# Patient Record
Sex: Male | Born: 1945 | Race: White | Hispanic: No | Marital: Married | Smoking: Former smoker
Health system: Southern US, Community
[De-identification: ages and names within clinical notes are randomized; demographics above are authoritative.]

## PROBLEM LIST (undated history)

## (undated) DIAGNOSIS — N289 Disorder of kidney and ureter, unspecified: Secondary | ICD-10-CM

## (undated) DIAGNOSIS — Z8719 Personal history of other diseases of the digestive system: Secondary | ICD-10-CM

## (undated) DIAGNOSIS — I10 Essential (primary) hypertension: Secondary | ICD-10-CM

## (undated) DIAGNOSIS — Z8619 Personal history of other infectious and parasitic diseases: Secondary | ICD-10-CM

## (undated) DIAGNOSIS — N189 Chronic kidney disease, unspecified: Secondary | ICD-10-CM

## (undated) DIAGNOSIS — T7840XA Allergy, unspecified, initial encounter: Secondary | ICD-10-CM

## (undated) DIAGNOSIS — Z87442 Personal history of urinary calculi: Secondary | ICD-10-CM

## (undated) DIAGNOSIS — F909 Attention-deficit hyperactivity disorder, unspecified type: Secondary | ICD-10-CM

## (undated) DIAGNOSIS — T1490XA Injury, unspecified, initial encounter: Secondary | ICD-10-CM

## (undated) DIAGNOSIS — K219 Gastro-esophageal reflux disease without esophagitis: Secondary | ICD-10-CM

## (undated) DIAGNOSIS — K2100 Gastro-esophageal reflux disease with esophagitis, without bleeding: Secondary | ICD-10-CM

## (undated) HISTORY — DX: Personal history of other infectious and parasitic diseases: Z86.19

## (undated) HISTORY — DX: Personal history of other diseases of the digestive system: Z87.19

## (undated) HISTORY — DX: Allergy, unspecified, initial encounter: T78.40XA

## (undated) HISTORY — DX: Gastro-esophageal reflux disease without esophagitis: K21.9

## (undated) HISTORY — DX: Personal history of urinary calculi: Z87.442

---

## 2005-01-06 DIAGNOSIS — Z8719 Personal history of other diseases of the digestive system: Secondary | ICD-10-CM

## 2005-01-06 HISTORY — DX: Personal history of other diseases of the digestive system: Z87.19

## 2005-10-21 DIAGNOSIS — Z7189 Other specified counseling: Secondary | ICD-10-CM | POA: Insufficient documentation

## 2005-10-21 DIAGNOSIS — Z136 Encounter for screening for cardiovascular disorders: Secondary | ICD-10-CM | POA: Insufficient documentation

## 2005-10-21 DIAGNOSIS — N200 Calculus of kidney: Secondary | ICD-10-CM | POA: Insufficient documentation

## 2005-10-21 DIAGNOSIS — K219 Gastro-esophageal reflux disease without esophagitis: Secondary | ICD-10-CM | POA: Insufficient documentation

## 2005-10-21 DIAGNOSIS — K5730 Diverticulosis of large intestine without perforation or abscess without bleeding: Secondary | ICD-10-CM | POA: Insufficient documentation

## 2005-10-21 DIAGNOSIS — J309 Allergic rhinitis, unspecified: Secondary | ICD-10-CM | POA: Insufficient documentation

## 2015-04-02 DIAGNOSIS — M2550 Pain in unspecified joint: Secondary | ICD-10-CM | POA: Insufficient documentation

## 2016-04-03 DIAGNOSIS — F900 Attention-deficit hyperactivity disorder, predominantly inattentive type: Secondary | ICD-10-CM | POA: Insufficient documentation

## 2016-05-02 DIAGNOSIS — N183 Chronic kidney disease, stage 3 unspecified: Secondary | ICD-10-CM | POA: Insufficient documentation

## 2016-05-02 DIAGNOSIS — N1832 Chronic kidney disease, stage 3b: Secondary | ICD-10-CM | POA: Insufficient documentation

## 2016-09-20 ENCOUNTER — Emergency Department (HOSPITAL_COMMUNITY): Payer: 59

## 2016-09-20 ENCOUNTER — Emergency Department (HOSPITAL_COMMUNITY)
Admission: EM | Admit: 2016-09-20 | Discharge: 2016-09-20 | Disposition: A | Discharge status: Home / Self Care | Payer: 59 | Attending: Emergency Medicine | Admitting: Emergency Medicine

## 2016-09-20 ENCOUNTER — Ambulatory Visit (HOSPITAL_COMMUNITY)
Admission: EM | Admit: 2016-09-20 | Discharge: 2016-09-20 | Disposition: A | Discharge status: Home / Self Care | Payer: 59 | Attending: Internal Medicine | Admitting: Internal Medicine

## 2016-09-20 ENCOUNTER — Encounter (HOSPITAL_COMMUNITY): Payer: Self-pay | Admitting: Emergency Medicine

## 2016-09-20 DIAGNOSIS — R531 Weakness: Secondary | ICD-10-CM

## 2016-09-20 DIAGNOSIS — I10 Essential (primary) hypertension: Secondary | ICD-10-CM | POA: Diagnosis not present

## 2016-09-20 DIAGNOSIS — F909 Attention-deficit hyperactivity disorder, unspecified type: Secondary | ICD-10-CM | POA: Diagnosis not present

## 2016-09-20 DIAGNOSIS — R42 Dizziness and giddiness: Secondary | ICD-10-CM

## 2016-09-20 DIAGNOSIS — Z79899 Other long term (current) drug therapy: Secondary | ICD-10-CM | POA: Insufficient documentation

## 2016-09-20 DIAGNOSIS — R110 Nausea: Secondary | ICD-10-CM | POA: Diagnosis not present

## 2016-09-20 DIAGNOSIS — E860 Dehydration: Secondary | ICD-10-CM | POA: Diagnosis not present

## 2016-09-20 DIAGNOSIS — Z7982 Long term (current) use of aspirin: Secondary | ICD-10-CM | POA: Insufficient documentation

## 2016-09-20 DIAGNOSIS — R001 Bradycardia, unspecified: Secondary | ICD-10-CM | POA: Diagnosis not present

## 2016-09-20 HISTORY — DX: Essential (primary) hypertension: I10

## 2016-09-20 LAB — COMPREHENSIVE METABOLIC PANEL
ALT: 20 U/L (ref 17–63)
AST: 27 U/L (ref 15–41)
Albumin: 3.5 g/dL (ref 3.5–5.0)
Alkaline Phosphatase: 80 U/L (ref 38–126)
Anion gap: 9 (ref 5–15)
BUN: 19 mg/dL (ref 6–20)
CO2: 24 mmol/L (ref 22–32)
Calcium: 9 mg/dL (ref 8.9–10.3)
Chloride: 108 mmol/L (ref 101–111)
Creatinine, Ser: 1.35 mg/dL — ABNORMAL HIGH (ref 0.61–1.24)
GFR calc Af Amer: >60 mL/min (ref >60)
GFR calc non Af Amer: 52 mL/min — ABNORMAL LOW (ref >60)
Glucose, Bld: 112 mg/dL — ABNORMAL HIGH (ref 65–99)
Potassium: 4.1 mmol/L (ref 3.5–5.1)
Sodium: 141 mmol/L (ref 135–145)
Total Bilirubin: 0.7 mg/dL (ref 0.3–1.2)
Total Protein: 6.3 g/dL — ABNORMAL LOW (ref 6.5–8.1)

## 2016-09-20 LAB — CBC
HCT: 40.2 % (ref 39.0–52.0)
Hemoglobin: 13.6 g/dL (ref 13.0–17.0)
MCH: 30.3 pg (ref 26.0–34.0)
MCHC: 33.8 g/dL (ref 30.0–36.0)
MCV: 89.5 fL (ref 78.0–100.0)
Platelets: 182 10*3/uL (ref 150–400)
RBC: 4.49 MIL/uL (ref 4.22–5.81)
RDW: 12.8 % (ref 11.5–15.5)
WBC: 4.8 10*3/uL (ref 4.0–10.5)

## 2016-09-20 LAB — DIFFERENTIAL
Basophils Absolute: 0 10*3/uL (ref 0.0–0.1)
Basophils Relative: 0 %
Eosinophils Absolute: 0.1 10*3/uL (ref 0.0–0.7)
Eosinophils Relative: 2 %
Lymphocytes Relative: 23 %
Lymphs Abs: 1.1 10*3/uL (ref 0.7–4.0)
Monocytes Absolute: 0.3 10*3/uL (ref 0.1–1.0)
Monocytes Relative: 6 %
Neutro Abs: 3.3 10*3/uL (ref 1.7–7.7)
Neutrophils Relative %: 69 %

## 2016-09-20 LAB — PROTIME-INR
INR: 1.01
Prothrombin Time: 13.3 seconds (ref 11.4–15.2)

## 2016-09-20 LAB — I-STAT CHEM 8, ED
BUN: 21 mg/dL — ABNORMAL HIGH (ref 6–20)
Calcium, Ion: 1.21 mmol/L (ref 1.15–1.40)
Chloride: 108 mmol/L (ref 101–111)
Creatinine, Ser: 1.3 mg/dL — ABNORMAL HIGH (ref 0.61–1.24)
Glucose, Bld: 109 mg/dL — ABNORMAL HIGH (ref 65–99)
HCT: 41 % (ref 39.0–52.0)
Hemoglobin: 13.9 g/dL (ref 13.0–17.0)
Potassium: 4.2 mmol/L (ref 3.5–5.1)
Sodium: 144 mmol/L (ref 135–145)
TCO2: 24 mmol/L (ref 22–32)

## 2016-09-20 LAB — I-STAT TROPONIN, ED: Troponin i, poc: 0 ng/mL (ref 0.00–0.08)

## 2016-09-20 LAB — APTT: aPTT: 30 seconds (ref 24–36)

## 2016-09-20 LAB — I-STAT CG4 LACTIC ACID, ED: Lactic Acid, Venous: 0.92 mmol/L (ref 0.5–1.9)

## 2016-09-20 LAB — MAGNESIUM: Magnesium: 2.1 mg/dL (ref 1.7–2.4)

## 2016-09-20 LAB — CBG MONITORING, ED: Glucose-Capillary: 89 mg/dL (ref 65–99)

## 2016-09-20 LAB — CK: Total CK: 116 U/L (ref 49–397)

## 2016-09-20 MED ORDER — SODIUM CHLORIDE 0.9 % IV BOLUS (SEPSIS)
1000.0000 mL | Freq: Once | INTRAVENOUS | Status: AC
  Administered 2016-09-20: 1000 mL via INTRAVENOUS

## 2016-09-20 MED ORDER — ONDANSETRON HCL 4 MG/2ML IJ SOLN
4.0000 mg | Freq: Once | INTRAMUSCULAR | Status: AC
  Administered 2016-09-20: 4 mg via INTRAVENOUS
  Filled 2016-09-20: qty 2

## 2016-09-20 MED ORDER — ONDANSETRON HCL 4 MG PO TABS: 4.0000 mg | ORAL_TABLET | Freq: Three times a day (TID) | ORAL | 0 refills | Status: DC | PRN

## 2016-09-20 NOTE — ED Triage Notes (Addendum)
Pt sent from urgent care for bradycardia. States weakness with dizziness and diaphoresis since waking up this morning. Pt states "this happens when I am dehydrated." Denies chest pain, shortness of breath. Does state headache to bilateral sides of head. Pt states he never gets headaches. Pt states he felt fine when he went to bed. No neurological symptoms, clear speech. Pt states some blurred vision this morning. Normal vision at present. Pt has hx of kidney stones and htn, denies other history. Pt from [place], unable to see medical history records at triage.

## 2016-09-20 NOTE — Discharge Instructions (Signed)
Please read and follow all provided instructions.  Your diagnoses today include:  1. Lightheadedness   2. Nausea   3. Dehydration     Tests performed today include: Vital signs. See below for your results today.  Blood work EKG Chest xray CT of head  Medications prescribed:  Take Zofran as needed for nausea.   Home care instructions:  Follow any educational materials contained in this packet.  Follow-up instructions: Please follow-up with your primary care provider for further evaluation of symptoms and treatment. If you do not have a PCP then you can use the number below to find one.   Return instructions:  Please return to the Emergency Department if you do not get better, if you get worse, or new symptoms OR  - Fever (temperature greater than 101.40F)  - Bleeding that does not stop with holding pressure to the area    -Severe pain (please note that you may be more sore the day after your accident)  - Chest Pain  - Difficulty breathing  - Severe nausea or vomiting  - Inability to tolerate food and liquids  - Passing out  - Skin becoming red around your wounds  - Change in mental status (confusion or lethargy)  - New numbness or weakness    Please return if you have any other emergent concerns.  Additional Information:  Your vital signs today were: BP 133/60    Pulse 61    Temp (!) 97.4 F (36.3 C) (Oral)    Resp 15    SpO2 100%  If your blood pressure (BP) was elevated above 135/85 this visit, please have this repeated by your doctor within one month.

## 2016-09-20 NOTE — ED Provider Notes (Signed)
[facility]    CSN: [id number] Arrival date & time: 09/20/16  1206     History   Chief Complaint Chief Complaint  Patient presents with  . Weakness    HPI Gary [name] is a 71 y.o. male.   71 year old male with history of hypertension comes in for 1 day history of lightheadedness, dizziness, diaphoresis. Denies chest pain, shortness of breath, palpitations. Family member states that he gets dehydrated easily due to minimal water intake throughout the day, and was mowing the lawn recently. Denies personal history of heart disease. Family history is significant for MI/stroke of mother, first MI at age 21.       Past Medical History:  Diagnosis Date  . Hypertension     There are no active problems to display for this patient.   History reviewed. No pertinent surgical history.     Home Medications    Prior to Admission medications   Medication Sig Start Date End Date Taking? Authorizing Provider  amphetamine-dextroamphetamine (ADDERALL) 10 MG tablet Take 10 mg by mouth daily with breakfast.   Yes [provider]  aspirin EC 81 MG tablet Take 81 mg by mouth daily.   Yes [provider]  atenolol (TENORMIN) 25 MG tablet Take 25 mg by mouth daily.    Yes [provider]  atorvastatin (LIPITOR) 40 MG tablet Take 40 mg by mouth daily.    Yes [provider]  lisinopril (PRINIVIL,ZESTRIL) 10 MG tablet Take 10 mg by mouth daily.    Yes [provider]  tamsulosin (FLOMAX) 0.4 MG CAPS capsule Take 0.4 mg by mouth.   Yes [provider]  acetaminophen (TYLENOL) 500 MG tablet Take 500 mg by mouth every 6 (six) hours as needed for mild pain.    [provider]  pantoprazole (PROTONIX) 40 MG tablet Take 40 mg by mouth daily.    [provider]  PRESCRIPTION MEDICATION Place 2 sprays into both nostrils as needed (Rhinitis).    [provider]  traMADol (ULTRAM) 50 MG tablet Take 50 mg by  mouth daily.    [provider]    Family History History reviewed. No pertinent family history.  Social History Social History  Substance Use Topics  . Smoking status: Never Smoker  . Smokeless tobacco: Never Used  . Alcohol use No     Allergies   Other and Shellfish allergy   Review of Systems Review of Systems  Reason unable to perform ROS: See HPI as above.     Physical Exam Triage Vital Signs ED Triage Vitals [09/20/16 1225]  Enc Vitals Group     BP (!) 145/66     Pulse Rate (!) 58     Resp 16     Temp (!) 97.1 F (36.2 C)     Temp Source Temporal     SpO2 100 %     Weight      Height      Head Circumference      Peak Flow      Pain Score      Pain Loc      Pain Edu?      Excl. in [place]?    No data found.   Updated Vital Signs BP (!) 145/66 (BP Location: Left Arm) Comment: reported BP to both CMA [employer] and PA Amy Yu  Pulse (!) 58 Comment: reported HR to CMA [employer] and PA Amy Tasia Catchings  Temp (!) 97.1 F (  36.2 C) (Temporal) Comment: Could not obtain temperature, wife stated that when he gets dehydrated a temperature is difficult to obtain. reported to both [employer] PA and CMA Ramon Meza  Resp 16   SpO2 100%    Physical Exam  Constitutional: He is oriented to person, place, and time. He appears well-developed and well-nourished. Distressed: Patient lying on exam table, appearing weak.  Cardiovascular: Regular rhythm and normal heart sounds.  Bradycardia present.  Exam reveals no gallop and no friction rub.   No murmur heard. Pulmonary/Chest: Effort normal and breath sounds normal. No respiratory distress. He has no wheezes. He has no rales.  Neurological: He is alert and oriented to person, place, and time.     UC Treatments / Results  Labs (all labs ordered are listed, but only abnormal results are displayed) Labs Reviewed - No data to display  EKG  EKG Interpretation None       Radiology Dg Chest 2 View  Result Date:  09/20/2016 CLINICAL DATA:  Per patient weakness, dizziness, bradycardia and nausea that started this AM. EXAM: CHEST  2 VIEW COMPARISON:  None. FINDINGS: Mild hyperinflation. Midline trachea. Normal heart size and mediastinal contours. No pleural effusion or pneumothorax. Clear lungs. IMPRESSION: Hyperinflation, without acute disease. Electronically Signed   By: Abigail Miyamoto M.D.   On: 09/20/2016 13:33   Ct Head Wo Contrast  Result Date: 09/20/2016 CLINICAL DATA:  71 year old male with headache, dizziness and blurred vision for 1 day. EXAM: CT HEAD WITHOUT CONTRAST TECHNIQUE: Contiguous axial images were obtained from the base of the skull through the vertex without intravenous contrast. COMPARISON:  None. FINDINGS: Brain: No evidence of infarction, hemorrhage, hydrocephalus, extra-axial collection or mass lesion/mass effect. Vascular: No hyperdense vessel or unexpected calcification. Skull: Normal. Negative for fracture or focal lesion. Sinuses/Orbits: Opacification of the visualized superior left maxillary sinus noted. No other abnormalities noted. Other: None. IMPRESSION: 1. No evidence of a intracranial abnormality 2. Opacified visualized superior left maxillary sinus/sinus disease. Electronically Signed   By: Margarette Canada M.D.   On: 09/20/2016 14:34    Procedures Procedures (including critical care time)  Medications Ordered in UC Medications - No data to display   Initial Impression / Assessment and Plan / UC Course  I have reviewed the triage vital signs and the nursing notes.  Pertinent labs & imaging results that were available during my care of the patient were reviewed by me and considered in my medical decision making (see chart for details).    71 year old male with history of hypertension comes in for 1 day history of lightheadedness, dizziness, diaphoresis. His initial HR was 49, which increased to 58 with recheck. Given positive family history of MI at age 32, patient to go to the  ED for further evaluation and treatment.   Final Clinical Impressions(s) / UC Diagnoses   Final diagnoses:  Weakness  Dizziness    New Prescriptions Discharge Medication List as of 09/20/2016 12:34 PM       Ok Edwards, PA-C 09/20/16 1723

## 2016-09-20 NOTE — ED Provider Notes (Signed)
[facility] DEPT Provider Note   CSN: [id number] Arrival date & time: 09/20/16  1243     History   Chief Complaint Chief Complaint  Patient presents with  . Dizziness    HPI Gary [name] is a 71 y.o. male with a history of HTN and ADHD who presents to the ED today from UC bradycardia and complaints of lightheadedness.   Patient states that last night before he went to bed and when he awoke this morning he was asymptomatic. 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 He denies chest pain or sob. The patient states that he went to an air conditioned room which resolved the diaphoresis but his lightheadedness and nausea continued. He presented to UC for this where he developed a b/l temporal headache with blurred vision that last for several minutes. He denies history of HA. No flashers, floaters, diplopia, photophobia, eye redness, trauma, painful EOM. HA and visual symptoms are now resolved. He denies dizziness or vertigo symptoms. At [facility] he was found to be bradycardic. He takes lisinopril and atenolol for his BP (last dose, last night). He took Adderall this morning for his ADHD. Notes he has been out in the sun, mowing his lawn and working on his yard the last few days. He says that he has not been hydrating. He notes "this is similar to what happens when I get dehydrated". He is from [place] and traveled [place] ~3 months ago.  Denies fever, focal weakness, palpitations, melena, recent URI, changes in hearing, unilateral neck pain, unilateral weakness, facial asymmetry, difficulty with speech, change in gait, LOC, emesis, alcohol/drug use, or trauma. No new medications.    HPI    Past Medical History:  Diagnosis Date  . Hypertension     There are no active problems to display for this patient.   No past surgical history on file.     Home Medications    Prior to  Admission medications   Medication Sig Start Date End Date Taking? Authorizing Provider  acetaminophen (TYLENOL) 500 MG tablet Take 500 mg by mouth every 6 (six) hours as needed for mild pain.   Yes [provider]  amphetamine-dextroamphetamine (ADDERALL) 10 MG tablet Take 10 mg by mouth daily with breakfast.   Yes [provider]  aspirin EC 81 MG tablet Take 81 mg by mouth daily.   Yes [provider]  atenolol (TENORMIN) 25 MG tablet Take 25 mg by mouth daily.    Yes [provider]  atorvastatin (LIPITOR) 40 MG tablet Take 40 mg by mouth daily.    Yes [provider]  lisinopril (PRINIVIL,ZESTRIL) 10 MG tablet Take 10 mg by mouth daily.    Yes [provider]  pantoprazole (PROTONIX) 40 MG tablet Take 40 mg by mouth daily.   Yes [provider]  PRESCRIPTION MEDICATION Place 2 sprays into both nostrils as needed (Rhinitis).   Yes [provider]  tamsulosin (FLOMAX) 0.4 MG CAPS capsule Take 0.4 mg by mouth.   Yes [provider]  traMADol (ULTRAM) 50 MG tablet Take 50 mg by mouth daily.   Yes [provider]  ondansetron (ZOFRAN) 4 MG tablet Take 1 tablet (4 mg total) by mouth every 8 (eight) hours as needed for nausea or vomiting. 09/20/16   Maczis, Barth Kirks, PA-C    Family History No family history on file.  Social History Social History  Substance Use Topics  . Smoking status: Never Smoker  . Smokeless tobacco: Never Used  . Alcohol use No     Allergies   Other and Shellfish allergy   Review of Systems Review of Systems  All other systems reviewed and are negative.    Physical Exam Updated Vital Signs BP (!) 160/80 (BP Location: Right Arm)   Pulse 68   Temp 97.6 F (36.4 C) (Oral)   Resp 16   SpO2 100%   Physical Exam  Constitutional: He appears well-developed and well-nourished.  Nontoxic appearing  HENT:  Head: Normocephalic and atraumatic.  Right Ear: External ear  normal.  Left Ear: External ear normal.  Nose: Nose normal.  Mouth/Throat: Uvula is midline, oropharynx is clear and moist and mucous membranes are normal. No tonsillar exudate.  Eyes: Pupils are equal, round, and reactive to light. Right eye exhibits no discharge. Left eye exhibits no discharge. No scleral icterus.  Neck: Trachea normal and normal range of motion. Neck supple. No spinous process tenderness present. No neck rigidity. Normal range of motion present.  No meningismus  Cardiovascular: Normal rate, regular rhythm and intact distal pulses.   No murmur heard. Pulses:      Radial pulses are 2+ on the right side, and 2+ on the left side.       Dorsalis pedis pulses are 2+ on the right side, and 2+ on the left side.       Posterior tibial pulses are 2+ on the right side, and 2+ on the left side.  No lower extremity swelling or edema. Calves symmetric in size bilaterally.  Pulmonary/Chest: Effort normal and breath sounds normal. He exhibits no tenderness.  Abdominal: Soft. Bowel sounds are normal. There is no tenderness. There is no rebound and no guarding.  Musculoskeletal: He exhibits no edema.  Lymphadenopathy:    He has no cervical adenopathy.  Neurological: He is alert.  Speech clear. Follows commands. No facial droop. PERRLA. EOMI. Normal peripheral fields. CN III-XII intact.  Grossly moves all extremities 4 without ataxia. Coordination intact. Able and appropriate strength for age to upper and lower extremities bilaterally including grip strength. Sensation to light touch intact bilaterally for upper and lower. Patellar deep tendon reflex 2+ and equal bilaterally. Normal finger to nose and rapid alternating movements. Normal heel to shin balance. Negative Romberg. No pronator drift. Normal gait.   Skin: Skin is warm and dry. Capillary refill takes less than 2 seconds. No rash noted. He is not diaphoretic.  Psychiatric: He has a normal mood and affect.  Nursing note and vitals  reviewed.    ED Treatments / Results  Labs (all labs ordered are listed, but only abnormal results are displayed) Labs Reviewed  COMPREHENSIVE METABOLIC PANEL - Abnormal; Notable for the following:       Result Value   Glucose, Bld 112 (*)    Creatinine, Ser 1.35 (*)    Total Protein 6.3 (*)    GFR calc non Af Amer 52 (*)    All other components within normal limits  I-STAT CHEM 8, ED - Abnormal; Notable for the following:    BUN 21 (*)    Creatinine, Ser 1.30 (*)    Glucose, Bld 109 (*)    All other components within normal limits  PROTIME-INR  APTT  CBC  DIFFERENTIAL  CK  MAGNESIUM  I-STAT TROPONIN, ED  CBG MONITORING, ED  I-STAT CG4 LACTIC ACID, ED    EKG  EKG Interpretation  Date/Time:  Saturday September 20 2016 12:49:47 EDT Ventricular Rate:  58 PR Interval:  140 QRS Duration: 86 QT Interval:  442 QTC Calculation: 433 R Axis:   37 Text Interpretation:  Sinus bradycardia Otherwise normal ECG T wave inversion III, no previous EKG available Confirmed by Theotis Burrow [phone]) on 09/20/2016 1:37:45 PM       Radiology Dg Chest 2 View  Result Date: 09/20/2016 CLINICAL DATA:  Per patient weakness, dizziness, bradycardia and nausea that started this AM. EXAM: CHEST  2 VIEW COMPARISON:  None. FINDINGS: Mild hyperinflation. Midline trachea. Normal heart size and mediastinal contours. No pleural effusion or pneumothorax. Clear lungs. IMPRESSION: Hyperinflation, without acute disease. Electronically Signed   By: Abigail Miyamoto M.D.   On: 09/20/2016 13:33   Ct Head Wo Contrast  Result Date: 09/20/2016 CLINICAL DATA:  71 year old male with headache, dizziness and blurred vision for 1 day. EXAM: CT HEAD WITHOUT CONTRAST TECHNIQUE: Contiguous axial images were obtained from the base of the skull through the vertex without intravenous contrast. COMPARISON:  None. FINDINGS: Brain: No evidence of infarction, hemorrhage, hydrocephalus, extra-axial collection or mass lesion/mass  effect. Vascular: No hyperdense vessel or unexpected calcification. Skull: Normal. Negative for fracture or focal lesion. Sinuses/Orbits: Opacification of the visualized superior left maxillary sinus noted. No other abnormalities noted. Other: None. IMPRESSION: 1. No evidence of a intracranial abnormality 2. Opacified visualized superior left maxillary sinus/sinus disease. Electronically Signed   By: Margarette Canada M.D.   On: 09/20/2016 14:34    Procedures Procedures (including critical care time)  Medications Ordered in ED Medications  sodium chloride 0.9 % bolus 1,000 mL (0 mLs Intravenous Stopped 09/20/16 1703)  ondansetron (ZOFRAN) injection 4 mg (4 mg Intravenous Given 09/20/16 1445)     Initial Impression / Assessment and Plan / ED Course  I have reviewed the triage vital signs and the nursing notes.  Pertinent labs & imaging results that were available during my care of the patient were reviewed by me and considered in my medical decision making (see chart for details).     71 year old male presenting with lightheadedness, nausea and diaphoresis that began this morning while the patient was at rest. He is also reports brief episode of HA with visual symptoms while at [facility] that now have resolved. While at [facility] he was found to be bradycardic and sent over for further evaluation. The patient admits to poor hydration and long hours in the sun over the past few days. Patient noted to have a bradycardia but vitals otherwise reassuring. On exam patient is nontoxic-appearing. He has normal neuro exam without focal deficits. He is able to ambulate without difficult. Will check CBG for hypoglycemia. Will check EKG for arrhythmia. CXR, Basic labs, Mg and Tn ordered. Ck ordered to rule out rhabdomyolysis. Other labs ordered in triage. CT head ordered due to new HA. Will check orthostatics.   CBG reassuring. I-Stat chem 8 shows patient appears mildly dehydrated. Will start on fluids. Lactic acid negative. CBC  reassuring, CMP without electrolyte abnormalities. Ck normal and does not suggest rhabdo. Mg normal. CXR unremarkable. CT head without evidence of intracranial abnormality. Orthostatic negative.   Patient without syncopal episode. Patient without arrhythmia or tachycardia while here in the department.  Patient without history of congestive heart failure, normal hematocrit, normal ECG, no shortness of breath and systolic blood pressure greater than 90. Test and exam are reassuring as above. Will plan for discharge home with close PCP follow-up.  Pt has remained hemodynamically stable throughout  their time in the ED  BP (!) 160/80 (BP Location: Right Arm)   Pulse 68   Temp 97.6 F (36.4 C) (Oral)   Resp 16   SpO2 100%    The patient was discussed with and seen by Dr. Rex Kras who agrees with the treatment plan.  Patient able to ambulate without difficulty before d/c.  Final Clinical Impressions(s) / ED Diagnoses   Final diagnoses:  Lightheadedness  Nausea  Dehydration    New Prescriptions Discharge Medication List as of 09/20/2016  5:27 PM       Jillyn Ledger, PA-C 09/20/16 2213    Little, Wenda Overland, MD 09/22/16 1705

## 2016-09-20 NOTE — ED Triage Notes (Signed)
Stroke swallow screen not done at this time. Med. Given for nausea.

## 2016-09-20 NOTE — Discharge Instructions (Signed)
Given your symptoms and family history of heart disease go to the emergency department for further evaluation of weakness/dizziness.

## 2016-09-20 NOTE — ED Notes (Signed)
Pt states he is still feeling lightheaded but denies any nausea.

## 2016-09-20 NOTE — ED Triage Notes (Signed)
Pt c/o weakness onset this am associated w/LH, dizziness and diaphoresis  Wife sts he gets like this when he is dehydrated  Denies fever  A&O x4... NAD... Ambulatory

## 2016-09-20 NOTE — ED Notes (Signed)
ED Provider at bedside. 

## 2017-03-16 ENCOUNTER — Ambulatory Visit: Payer: 59 | Admitting: Physician Assistant

## 2017-03-16 ENCOUNTER — Other Ambulatory Visit: Payer: Self-pay

## 2017-03-16 ENCOUNTER — Encounter: Payer: Self-pay | Admitting: Physician Assistant

## 2017-03-16 VITALS — BP 130/72 | HR 74 | Temp 97.5°F | Resp 16 | Ht 70.5 in | Wt 166.0 lb

## 2017-03-16 DIAGNOSIS — I10 Essential (primary) hypertension: Secondary | ICD-10-CM

## 2017-03-16 DIAGNOSIS — R5382 Chronic fatigue, unspecified: Secondary | ICD-10-CM | POA: Diagnosis not present

## 2017-03-16 DIAGNOSIS — R6889 Other general symptoms and signs: Secondary | ICD-10-CM | POA: Diagnosis not present

## 2017-03-16 DIAGNOSIS — H9193 Unspecified hearing loss, bilateral: Secondary | ICD-10-CM

## 2017-03-16 DIAGNOSIS — L920 Granuloma annulare: Secondary | ICD-10-CM | POA: Diagnosis not present

## 2017-03-16 DIAGNOSIS — R209 Unspecified disturbances of skin sensation: Secondary | ICD-10-CM

## 2017-03-16 LAB — CBC WITH DIFFERENTIAL/PLATELET
Basophils Absolute: 0 10*3/uL (ref 0.0–0.1)
Basophils Relative: 0.6 % (ref 0.0–3.0)
Eosinophils Absolute: 0.1 10*3/uL (ref 0.0–0.7)
Eosinophils Relative: 2.6 % (ref 0.0–5.0)
HCT: 42.6 % (ref 39.0–52.0)
Hemoglobin: 14.6 g/dL (ref 13.0–17.0)
Lymphocytes Relative: 31.3 % (ref 12.0–46.0)
Lymphs Abs: 1.3 10*3/uL (ref 0.7–4.0)
MCHC: 34.3 g/dL (ref 30.0–36.0)
MCV: 89.2 fl (ref 78.0–100.0)
Monocytes Absolute: 0.3 10*3/uL (ref 0.1–1.0)
Monocytes Relative: 8 % (ref 3.0–12.0)
Neutro Abs: 2.4 10*3/uL (ref 1.4–7.7)
Neutrophils Relative %: 57.5 % (ref 43.0–77.0)
Platelets: 236 10*3/uL (ref 150.0–400.0)
RBC: 4.77 Mil/uL (ref 4.22–5.81)
RDW: 13 % (ref 11.5–15.5)
WBC: 4.1 10*3/uL (ref 4.0–10.5)

## 2017-03-16 NOTE — Patient Instructions (Addendum)
Please go to the lab today for blood work.  I will call you with your results. We will alter treatment regimen(s) if indicated by your results.   Please stop the Atenolol for now. Continue the Lisinopril as directed. If kidney function looks good today, I may increase the dose of this.  Continue other medications as directed for now.   The Atenolol can cause a lot of your symptoms so I want to stop this first while we are checking labs before we add on any other medication. 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   Follow-up with me in 10-14 days for reassessment of BP and further assessment of current issues.

## 2017-03-16 NOTE — Progress Notes (Signed)
  Patient presents to clinic today to establish care.  Acute Concerns: Patient endorses daily fatigue despite restful sleep at night. States it does not take much to make him fatigues. Notes coldness of extremities with discomfort, especially in winter months. Patient denies chest pain, palpitations, lightheadedness, dizziness, vision changes or frequent headaches. Denies history of known vitamin deficiency. Denies snoring at night. Denies fever, chills, night sweats or unexplainable changes in weight.   Chronic Issues: Hypertension --  Is currently on a regimen of Atenolol and Lisinopril. Patient denies chest pain, palpitations, lightheadedness, vision changes or frequent headaches. Does have a history of syncope secondary to severe dehydration.   Hyperlipidemia -- Is currently on Atorvastatin 40 mg daily. Is also taking an 81 mg ASA daily. Denies myalgias with medication. Is wondering if he still needs as high of a dose.    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 Feels he has new lesions of posterior neck. No current follow-up with Dermatology.   Past Medical History:  Diagnosis Date  . Allergy   . GERD (gastroesophageal reflux disease)   . History of chickenpox   . History of diverticulitis 2007  . History of kidney stones   . Hypertension     History reviewed. No pertinent surgical history.  Current Outpatient Medications on File Prior to Visit  Medication Sig Dispense Refill  . amphetamine-dextroamphetamine (ADDERALL) 10 MG tablet Take 10 mg by mouth daily with breakfast.    . amphetamine-dextroamphetamine (ADDERALL) 5 MG tablet Take 5 mg by mouth daily.    . aspirin EC 81 MG tablet Take 81 mg by mouth daily.    . atenolol (TENORMIN) 25 MG tablet Take 25 mg by mouth daily.     . atorvastatin (LIPITOR) 40 MG tablet Take 40 mg by mouth daily.     . ipratropium (ATROVENT) 0.03 % nasal spray Place 2 sprays into both nostrils every 12 (twelve)  hours.    . lisinopril (PRINIVIL,ZESTRIL) 10 MG tablet Take 10 mg by mouth daily.     . Multiple Vitamin (MULTIVITAMIN) tablet Take 1 tablet by mouth daily.    . pantoprazole (PROTONIX) 40 MG tablet Take 40 mg by mouth daily.    . tamsulosin (FLOMAX) 0.4 MG CAPS capsule Take 0.4 mg by mouth.    . traMADol (ULTRAM) 50 MG tablet Take 50 mg by mouth daily.    . vitamin C (ASCORBIC ACID) 500 MG tablet Take 500 mg by mouth daily.     No current facility-administered medications on file prior to visit.     Allergies  Allergen Reactions  . Other Other (See Comments)    Amoxetine causing: unknown  . Shellfish Allergy Hives    Family History  Problem Relation Age of Onset  . Heart disease Mother   . Hypertension Mother   . Stroke Mother     Social History   Socioeconomic History  . Marital status: Married    Spouse name: Not on file  . Number of children: Not on file  . Years of education: Not on file  . Highest education level: Not on file  Social Needs  . Financial resource strain: Not on file  . Food insecurity - worry: Not on file  . Food insecurity - inability: Not on file  . Transportation needs - medical: Not on file  . Transportation needs - non-medical: Not on file  Occupational History  . Not on file  Tobacco Use  . Smoking status:   Former Smoker  . Smokeless tobacco: Never Used  Substance and Sexual Activity  . Alcohol use: No  . Drug use: No  . Sexual activity: Yes  Other Topics Concern  . Not on file  Social History Narrative  . Not on file   Review of Systems  Constitutional: Positive for malaise/fatigue. Negative for chills and fever.  HENT: Positive for hearing loss. Negative for ear discharge, ear pain and tinnitus.   Eyes: Negative for blurred vision and double vision.  Respiratory: Negative for cough, sputum production and shortness of breath.   Cardiovascular: Negative for chest pain and palpitations.  Musculoskeletal: Negative for myalgias.    Skin: Positive for rash.  Neurological: Negative for dizziness and headaches.  Endo/Heme/Allergies: Negative for environmental allergies.  Psychiatric/Behavioral: Negative for depression, hallucinations, substance abuse and suicidal ideas. The patient is not nervous/anxious.     BP 130/72   Pulse 74   Temp (!) 97.5 F (36.4 C) (Oral)   Resp 16   Ht 5' 10.5" (1.791 m)   Wt 166 lb (75.3 kg)   SpO2 98%   BMI 23.48 kg/m   Physical Exam  Constitutional: He is oriented to person, place, and time and well-developed, well-nourished, and in no distress.  HENT:  Head: Normocephalic and atraumatic.  Eyes: Conjunctivae are normal.  Neck: Neck supple.  Cardiovascular: Normal rate, regular rhythm, normal heart sounds and intact distal pulses.  Pulmonary/Chest: Effort normal and breath sounds normal. No respiratory distress. He has no wheezes. He has no rales. He exhibits no tenderness.  Abdominal: Soft. Bowel sounds are normal. He exhibits no distension and no mass. There is no tenderness. There is no rebound and no guarding.  Neurological: He is alert and oriented to person, place, and time.  Skin: Skin is warm and dry. No rash noted.  Psychiatric: Affect normal.  Vitals reviewed.  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 Will refer to Dermatology for management.  - Ambulatory referral to Dermatology  2. Essential hypertension Fating lipids today. 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 Continue Lisinopril. Close follow-up scheduled to reassess.  - Lipid panel  3. Cold extremities Concern that Atenolol is contributing. Will hold for now and see if symptoms improving. If not, would recommend switching BB for CCB to help with vasodilation.   4. Chronic fatigue Unspecified. Concern that BB is contributing. We are holding currently. Labs as noted below.  - CBC w/Diff - Comp Met (CMET) -  TSH - B12 and Folate Panel - Vitamin D (25 hydroxy)  5. Bilateral hearing loss, unspecified hearing loss type Long-standing. No acute changes per patient. Referral to Audiology. - Ambulatory referral to Audiology   Leeanne Rio, PA-C

## 2017-03-17 LAB — COMPREHENSIVE METABOLIC PANEL
ALT: 21 U/L (ref 0–53)
AST: 21 U/L (ref 0–37)
Albumin: 3.6 g/dL (ref 3.5–5.2)
Alkaline Phosphatase: 84 U/L (ref 39–117)
BUN: 18 mg/dL (ref 6–23)
CO2: 28 mEq/L (ref 19–32)
Calcium: 9.7 mg/dL (ref 8.4–10.5)
Chloride: 108 mEq/L (ref 96–112)
Creatinine, Ser: 1.21 mg/dL (ref 0.40–1.50)
GFR: 63.13 mL/min (ref >60.00)
Glucose, Bld: 84 mg/dL (ref 70–99)
Potassium: 4.1 mEq/L (ref 3.5–5.1)
Sodium: 143 mEq/L (ref 135–145)
Total Bilirubin: 0.3 mg/dL (ref 0.2–1.2)
Total Protein: 6.4 g/dL (ref 6.0–8.3)

## 2017-03-17 LAB — LIPID PANEL
Cholesterol: 121 mg/dL (ref 0–200)
HDL: 30.7 mg/dL — ABNORMAL LOW (ref >39.00)
LDL Cholesterol: 63 mg/dL (ref 0–99)
NonHDL: 90.75
Total CHOL/HDL Ratio: 4
Triglycerides: 137 mg/dL (ref 0.0–149.0)
VLDL: 27.4 mg/dL (ref 0.0–40.0)

## 2017-03-17 LAB — B12 AND FOLATE PANEL
Folate: 19.1 ng/mL (ref >5.9)
Vitamin B-12: 189 pg/mL — ABNORMAL LOW (ref 211–911)

## 2017-03-17 LAB — VITAMIN D 25 HYDROXY (VIT D DEFICIENCY, FRACTURES): VITD: 21.23 ng/mL — ABNORMAL LOW (ref 30.00–100.00)

## 2017-03-17 LAB — TSH: TSH: 2.92 u[IU]/mL (ref 0.35–4.50)

## 2017-03-18 ENCOUNTER — Other Ambulatory Visit: Payer: Self-pay | Admitting: Physician Assistant

## 2017-03-18 DIAGNOSIS — E559 Vitamin D deficiency, unspecified: Secondary | ICD-10-CM

## 2017-03-18 DIAGNOSIS — E538 Deficiency of other specified B group vitamins: Secondary | ICD-10-CM

## 2017-03-18 MED ORDER — VITAMIN D (CHOLECALCIFEROL) 25 MCG (1000 UT) PO CAPS: 1.0000 | ORAL_CAPSULE | Freq: Every day | ORAL | 0 refills | Status: DC

## 2017-03-18 MED ORDER — VITAMIN D (ERGOCALCIFEROL) 1.25 MG (50000 UNIT) PO CAPS: 50000.0000 [IU] | ORAL_CAPSULE | ORAL | 0 refills | Status: DC

## 2017-03-18 MED ORDER — VITAMIN B-12 100 MCG PO TABS: 100.0000 ug | ORAL_TABLET | Freq: Every day | ORAL | 0 refills | Status: DC

## 2017-03-19 ENCOUNTER — Encounter: Payer: Self-pay | Admitting: Physician Assistant

## 2017-03-23 ENCOUNTER — Encounter: Payer: Self-pay | Admitting: Physician Assistant

## 2017-03-24 ENCOUNTER — Encounter: Payer: Self-pay | Admitting: Physician Assistant

## 2017-03-24 DIAGNOSIS — K5790 Diverticulosis of intestine, part unspecified, without perforation or abscess without bleeding: Secondary | ICD-10-CM | POA: Insufficient documentation

## 2017-03-24 DIAGNOSIS — E785 Hyperlipidemia, unspecified: Secondary | ICD-10-CM | POA: Insufficient documentation

## 2017-03-24 DIAGNOSIS — N182 Chronic kidney disease, stage 2 (mild): Secondary | ICD-10-CM

## 2017-03-24 DIAGNOSIS — Z87442 Personal history of urinary calculi: Secondary | ICD-10-CM | POA: Insufficient documentation

## 2017-03-24 DIAGNOSIS — I129 Hypertensive chronic kidney disease with stage 1 through stage 4 chronic kidney disease, or unspecified chronic kidney disease: Secondary | ICD-10-CM | POA: Insufficient documentation

## 2017-03-24 DIAGNOSIS — N183 Chronic kidney disease, stage 3 unspecified: Secondary | ICD-10-CM | POA: Insufficient documentation

## 2017-03-24 DIAGNOSIS — I10 Essential (primary) hypertension: Secondary | ICD-10-CM | POA: Insufficient documentation

## 2017-04-01 ENCOUNTER — Ambulatory Visit: Payer: 59 | Admitting: Physician Assistant

## 2017-04-01 ENCOUNTER — Encounter: Payer: Self-pay | Admitting: Physician Assistant

## 2017-04-01 ENCOUNTER — Other Ambulatory Visit: Payer: Self-pay

## 2017-04-01 VITALS — BP 152/76 | HR 91 | Temp 97.7°F | Resp 16 | Ht 71.0 in | Wt 170.0 lb

## 2017-04-01 DIAGNOSIS — I10 Essential (primary) hypertension: Secondary | ICD-10-CM | POA: Diagnosis not present

## 2017-04-01 DIAGNOSIS — N401 Enlarged prostate with lower urinary tract symptoms: Secondary | ICD-10-CM

## 2017-04-01 DIAGNOSIS — K219 Gastro-esophageal reflux disease without esophagitis: Secondary | ICD-10-CM | POA: Diagnosis not present

## 2017-04-01 MED ORDER — TAMSULOSIN HCL 0.4 MG PO CAPS: 0.4000 mg | ORAL_CAPSULE | Freq: Every day | ORAL | 1 refills | Status: DC

## 2017-04-01 MED ORDER — PANTOPRAZOLE SODIUM 40 MG PO TBEC: 40.0000 mg | DELAYED_RELEASE_TABLET | Freq: Every day | ORAL | 1 refills | Status: DC

## 2017-04-01 MED ORDER — AMPHETAMINE-DEXTROAMPHETAMINE 5 MG PO TABS: 5.0000 mg | ORAL_TABLET | Freq: Every day | ORAL | 0 refills | Status: DC

## 2017-04-01 MED ORDER — AMPHETAMINE-DEXTROAMPHETAMINE 10 MG PO TABS: 10.0000 mg | ORAL_TABLET | Freq: Every day | ORAL | 0 refills | Status: DC

## 2017-04-01 MED ORDER — TRAMADOL HCL 50 MG PO TABS: 50.0000 mg | ORAL_TABLET | Freq: Every day | ORAL | 0 refills | Status: DC

## 2017-04-01 MED ORDER — LISINOPRIL 20 MG PO TABS
20.0000 mg | ORAL_TABLET | Freq: Every day | ORAL | 3 refills | Status: DC
Start: 2017-04-01 — End: 2017-05-12

## 2017-04-01 MED ORDER — ATORVASTATIN CALCIUM 40 MG PO TABS: 40.0000 mg | ORAL_TABLET | Freq: Every day | ORAL | 1 refills | Status: DC

## 2017-04-01 NOTE — Progress Notes (Signed)
Patient presents to clinic today for follow-up of hypertension. At last visit, patient was instructed to hold Atenolol due to fatigue and rash (connection between BB and granuloma annulare per wife's research). Is holding and notes feeling some better. He was found to be deficient of Vitamin D and B12. Is currently on supplementation. Has noted mild headache. BP have been in 564-332R systolic at home. Patient denies chest pain, palpitations, lightheadedness, dizziness, vision changes.   Past Medical History:  Diagnosis Date  . Allergy   . GERD (gastroesophageal reflux disease)   . History of chickenpox   . History of diverticulitis 2007  . History of kidney stones   . Hypertension     Current Outpatient Medications on File Prior to Visit  Medication Sig Dispense Refill  . aspirin EC 81 MG tablet Take 81 mg by mouth daily.    Marland Kitchen ipratropium (ATROVENT) 0.03 % nasal spray Place 2 sprays into both nostrils every 12 (twelve) hours.    . Multiple Vitamin (MULTIVITAMIN) tablet Take 1 tablet by mouth daily.    . vitamin B-12 (CYANOCOBALAMIN) 100 MCG tablet Take 1 tablet (100 mcg total) by mouth daily. 30 tablet 0  . vitamin C (ASCORBIC ACID) 500 MG tablet Take 500 mg by mouth daily.    . Vitamin D, Cholecalciferol, 1000 units CAPS Take 1 capsule by mouth daily. 60 capsule 0  . Vitamin D, Ergocalciferol, (DRISDOL) 50000 units CAPS capsule Take 1 capsule (50,000 Units total) by mouth every 7 (seven) days. 8 capsule 0   No current facility-administered medications on file prior to visit.     Allergies  Allergen Reactions  . Other Other (See Comments)    Amoxetine causing: unknown  . Shellfish Allergy Hives    Family History  Problem Relation Age of Onset  . Heart disease Mother   . Hypertension Mother   . Stroke Mother     Social History   Socioeconomic History  . Marital status: Married    Spouse name: Not on file  . Number of children: Not on file  . Years of education: Not on  file  . Highest education level: Not on file  Occupational History  . Not on file  Social Needs  . Financial resource strain: Not on file  . Food insecurity:    Worry: Not on file    Inability: Not on file  . Transportation needs:    Medical: Not on file    Non-medical: Not on file  Tobacco Use  . Smoking status: Former Research scientist (life sciences)  . Smokeless tobacco: Never Used  Substance and Sexual Activity  . Alcohol use: No  . Drug use: No  . Sexual activity: Yes  Lifestyle  . Physical activity:    Days per week: Not on file    Minutes per session: Not on file  . Stress: Not on file  Relationships  . Social connections:    Talks on phone: Not on file    Gets together: Not on file    Attends religious service: Not on file    Active member of club or organization: Not on file    Attends meetings of clubs or organizations: Not on file    Relationship status: Not on file  Other Topics Concern  . Not on file  Social History Narrative  . Not on file   Review of Systems - See HPI.  All other ROS are negative.  BP (!) 152/76   Pulse 91   Temp 97.7  F (36.5 C) (Oral)   Resp 16   Ht _0  (1.803 m)   Wt 170 lb (77.1 kg)   SpO2 97%   BMI 23.71 kg/m   Physical Exam  Constitutional: He is oriented to person, place, and time and well-developed, well-nourished, and in no distress.  HENT:  Head: Normocephalic and atraumatic.  Eyes: Conjunctivae are normal.  Neck: Neck supple.  Cardiovascular: Normal rate, regular rhythm, normal heart sounds and intact distal pulses.  Pulmonary/Chest: Effort normal and breath sounds normal. No respiratory distress. He has no wheezes. He has no rales. He exhibits no tenderness.  Neurological: He is alert and oriented to person, place, and time.  Skin: Skin is warm and dry.  Psychiatric: Affect normal.  Vitals reviewed.  Recent Results (from the past 2160 hour(s))  CBC w/Diff     Status: None   Collection Time: 03/16/17  3:04 PM  Result Value Ref  Range   WBC 4.1 4.0 - 10.5 K/uL   RBC 4.77 4.22 - 5.81 Mil/uL   Hemoglobin 14.6 13.0 - 17.0 g/dL   HCT 42.6 39.0 - 52.0 %   MCV 89.2 78.0 - 100.0 fl   MCHC 34.3 30.0 - 36.0 g/dL   RDW 13.0 11.5 - 15.5 %   Platelets 236.0 150.0 - 400.0 K/uL   Neutrophils Relative % 57.5 43.0 - 77.0 %   Lymphocytes Relative 31.3 12.0 - 46.0 %   Monocytes Relative 8.0 3.0 - 12.0 %   Eosinophils Relative 2.6 0.0 - 5.0 %   Basophils Relative 0.6 0.0 - 3.0 %   Neutro Abs 2.4 1.4 - 7.7 K/uL   Lymphs Abs 1.3 0.7 - 4.0 K/uL   Monocytes Absolute 0.3 0.1 - 1.0 K/uL   Eosinophils Absolute 0.1 0.0 - 0.7 K/uL   Basophils Absolute 0.0 0.0 - 0.1 K/uL  Comp Met (CMET)     Status: None   Collection Time: 03/16/17  3:04 PM  Result Value Ref Range   Sodium 143 135 - 145 mEq/L   Potassium 4.1 3.5 - 5.1 mEq/L   Chloride 108 96 - 112 mEq/L   CO2 28 19 - 32 mEq/L   Glucose, Bld 84 70 - 99 mg/dL   BUN 18 6 - 23 mg/dL   Creatinine, Ser 1.21 0.40 - 1.50 mg/dL   Total Bilirubin 0.3 0.2 - 1.2 mg/dL   Alkaline Phosphatase 84 39 - 117 U/L   AST 21 0 - 37 U/L   ALT 21 0 - 53 U/L   Total Protein 6.4 6.0 - 8.3 g/dL   Albumin 3.6 3.5 - 5.2 g/dL   Calcium 9.7 8.4 - 10.5 mg/dL   GFR 63.13 >60.00 mL/min  Lipid panel     Status: Abnormal   Collection Time: 03/16/17  3:04 PM  Result Value Ref Range   Cholesterol 121 0 - 200 mg/dL    Comment: ATP III Classification       Desirable:  < 200 mg/dL               Borderline High:  200 - 239 mg/dL          High:  > = 240 mg/dL   Triglycerides 137.0 0.0 - 149.0 mg/dL    Comment: Normal:  <150 mg/dLBorderline High:  150 - 199 mg/dL   HDL 30.70 (L) >39.00 mg/dL   VLDL 27.4 0.0 - 40.0 mg/dL   LDL Cholesterol 63 0 - 99 mg/dL   Total CHOL/HDL Ratio 4  Comment:                Men          Women1/2 Average Risk     3.4          3.3Average Risk          5.0          4.42X Average Risk          9.6          7.13X Average Risk          15.0          11.0                       NonHDL 90.75      Comment: NOTE:  Non-HDL goal should be 30 mg/dL higher than patient's LDL goal (i.e. LDL goal of < 70 mg/dL, would have non-HDL goal of < 100 mg/dL)  TSH     Status: None   Collection Time: 03/16/17  3:04 PM  Result Value Ref Range   TSH 2.92 0.35 - 4.50 uIU/mL  B12 and Folate Panel     Status: Abnormal   Collection Time: 03/16/17  3:04 PM  Result Value Ref Range   Vitamin B-12 189 (L) 211 - 911 pg/mL   Folate 19.1 >5.9 ng/mL  Vitamin D (25 hydroxy)     Status: Abnormal   Collection Time: 03/16/17  3:04 PM  Result Value Ref Range   VITD 21.23 (L) 30.00 - 100.00 ng/mL    Assessment/Plan: Hypertension Improvement in symptoms off of the Atenolol. However now with mild headache likely 2/2 uncontrolled BP. Will check labs today. Increase Lisinopril to 20 mg daily. Follow-up scheduled.     Leeanne Rio, PA-C

## 2017-04-01 NOTE — Patient Instructions (Addendum)
Please stay off of the Atenolol. Start the new dose of the Lisinopril daily.  Keep a low salt diet.  Keep a close check on BP measurements over the past week. Please call me in 1 week with measurements.  Keep up with Vitamin D and B12 supplementation. This should help with fatigue over the next few weeks.  Keep appointment with the Dermatologist.  Also please call [facility] at [phone] to set up your account and they will mail out your medications.   Follow-up with me in 3-4 weeks for reassessment. You will also be contacted to schedule an appointment with a sleep specialist.

## 2017-04-02 NOTE — Assessment & Plan Note (Signed)
Improvement in symptoms off of the Atenolol. However now with mild headache likely 2/2 uncontrolled BP. Will check labs today. Increase Lisinopril to 20 mg daily. Follow-up scheduled.

## 2017-04-25 ENCOUNTER — Emergency Department
Admission: EM | Admit: 2017-04-25 | Discharge: 2017-04-25 | Disposition: A | Discharge status: Home / Self Care | Payer: 59 | Source: Home / Self Care | Attending: Family Medicine | Admitting: Family Medicine

## 2017-04-25 ENCOUNTER — Emergency Department (INDEPENDENT_AMBULATORY_CARE_PROVIDER_SITE_OTHER): Payer: 59

## 2017-04-25 ENCOUNTER — Encounter: Payer: Self-pay | Admitting: Emergency Medicine

## 2017-04-25 ENCOUNTER — Other Ambulatory Visit: Payer: Self-pay

## 2017-04-25 DIAGNOSIS — R509 Fever, unspecified: Secondary | ICD-10-CM

## 2017-04-25 DIAGNOSIS — J069 Acute upper respiratory infection, unspecified: Secondary | ICD-10-CM

## 2017-04-25 DIAGNOSIS — B9789 Other viral agents as the cause of diseases classified elsewhere: Secondary | ICD-10-CM

## 2017-04-25 DIAGNOSIS — R05 Cough: Secondary | ICD-10-CM | POA: Diagnosis not present

## 2017-04-25 MED ORDER — AZITHROMYCIN 250 MG PO TABS: ORAL_TABLET | ORAL | 0 refills | Status: DC

## 2017-04-25 NOTE — ED Triage Notes (Signed)
This day 3 of congestion, cough and low grade fever and some chills. Took Day-quil this morning.

## 2017-04-25 NOTE — Discharge Instructions (Addendum)
Take plain guaifenesin (1200mg  extended release tabs such as Mucinex) twice daily, with plenty of water, for cough and congestion.  Get adequate rest.   May use Afrin nasal spray (or generic oxymetazoline) each morning for about 5 days and then discontinue.  Also recommend using saline nasal spray several times daily and saline nasal irrigation (AYR is a common brand).  Use Atrovent nasal spray each morning after using Afrin nasal spray and saline nasal irrigation. Try warm salt water gargles for sore throat.  Stop all antihistamines for now, and other non-prescription cough/cold preparations. May take Tylenol for body aches, fever, etc. May take Delsym Cough Suppressant at bedtime for nighttime cough.  Begin Azithromycin if not improving about one week or if persistent fever develops

## 2017-04-25 NOTE — ED Provider Notes (Signed)
[name]    CSN: [id number] Arrival date & time: 04/25/17  1338     History   Chief Complaint Chief Complaint  Patient presents with  . Cough  . Nasal Congestion  . Fever    HPI [name] is a 72 y.o. male.   Complains of 3 day history flu-like illness including myalgias, headache, fever/chills, fatigue, and cough.  Also has mild nasal congestion and sore throat.  Cough is non-productive and somewhat worse at night.  Now wheezing or shortness of breath, but he complains of bilateral inferior rib pain.  The history is provided by the patient.    Past Medical History:  Diagnosis Date  . Allergy   . GERD (gastroesophageal reflux disease)   . History of chickenpox   . History of diverticulitis 2007  . History of kidney stones   . Hypertension     Patient Active Problem List   Diagnosis Date Noted  . Hypertension 03/24/2017  . History of nephrolithiasis 03/24/2017  . Hyperlipidemia 03/24/2017  . Diverticulosis 03/24/2017    History reviewed. No pertinent surgical history.     Home Medications    Prior to Admission medications   Medication Sig Start Date End Date Taking? Authorizing Provider  amphetamine-dextroamphetamine (ADDERALL) 10 MG tablet Take 1 tablet (10 mg total) by mouth daily with breakfast. 04/01/17   Brunetta Jeans, PA-C  amphetamine-dextroamphetamine (ADDERALL) 5 MG tablet Take 1 tablet (5 mg total) by mouth daily. 04/01/17   Brunetta Jeans, PA-C  aspirin EC 81 MG tablet Take 81 mg by mouth daily.    [provider]  atorvastatin (LIPITOR) 40 MG tablet Take 1 tablet (40 mg total) by mouth daily. 04/01/17   Brunetta Jeans, PA-C  azithromycin (ZITHROMAX Z-PAK) 250 MG tablet Take 2 tabs today; then begin one tab once daily for 4 more days. (Rx void after 05/03/17) 04/25/17   Kandra Nicolas, MD  ipratropium (ATROVENT) 0.03 % nasal spray Place 2 sprays into both nostrils every 12 (twelve) hours.    [provider]  lisinopril (PRINIVIL,ZESTRIL) 20 MG tablet Take 1 tablet (20 mg total) by mouth daily. 04/01/17   Brunetta Jeans, PA-C  Multiple Vitamin (MULTIVITAMIN) tablet Take 1 tablet by mouth daily.    [provider]  pantoprazole (PROTONIX) 40 MG tablet Take 1 tablet (40 mg total) by mouth daily. 04/01/17   Brunetta Jeans, PA-C  tamsulosin (FLOMAX) 0.4 MG CAPS capsule Take 1 capsule (0.4 mg total) by mouth daily after supper. 04/01/17   Brunetta Jeans, PA-C  traMADol (ULTRAM) 50 MG tablet Take 1 tablet (50 mg total) by mouth daily. 04/01/17   Brunetta Jeans, PA-C  vitamin B-12 (CYANOCOBALAMIN) 100 MCG tablet Take 1 tablet (100 mcg total) by mouth daily. 03/18/17   Brunetta Jeans, PA-C  vitamin C (ASCORBIC ACID) 500 MG tablet Take 500 mg by mouth daily.    [provider]  Vitamin D, Cholecalciferol, 1000 units CAPS Take 1 capsule by mouth daily. 03/18/17   Brunetta Jeans, PA-C  Vitamin D, Ergocalciferol, (DRISDOL) 50000 units CAPS capsule Take 1 capsule (50,000 Units total) by mouth every 7 (seven) days. 03/18/17   Brunetta Jeans, PA-C    Family History Family History  Problem Relation Age of Onset  . Heart disease Mother   . Hypertension Mother   . Stroke Mother     Social History Social History   Tobacco Use  . Smoking status: Former  Smoker  . Smokeless tobacco: Never Used  Substance Use Topics  . Alcohol use: No  . Drug use: No     Allergies   Other and Shellfish allergy   Review of Systems Review of Systems + sore throat + cough + pleuritic pain No wheezing + nasal congestion + post-nasal drainage No sinus pain/pressure No itchy/red eyes No earache No hemoptysis No SOB + fever, + chills No nausea No vomiting No abdominal pain No diarrhea No urinary symptoms No skin rash + fatigue + myalgias + headache Used OTC meds without relief   Physical Exam Triage Vital Signs ED Triage Vitals  Enc Vitals Group     BP  04/25/17 1412 123/70     Pulse Rate 04/25/17 1412 76     Resp 04/25/17 1412 18     Temp 04/25/17 1412 98.8 F (37.1 C)     Temp Source 04/25/17 1412 Oral     SpO2 04/25/17 1412 97 %     Weight 04/25/17 1415 165 lb (74.8 kg)     Height 04/25/17 1415 5\' 10"  (1.778 m)     Head Circumference --      Peak Flow --      Pain Score 04/25/17 1414 0     Pain Loc --      Pain Edu? --      Excl. in [place]? --    No data found.  Updated Vital Signs BP 123/70 (BP Location: Right Arm)   Pulse 76   Temp 98.8 F (37.1 C) (Oral)   Resp 18   Ht 5\' 10"  (1.778 m)   Wt 165 lb (74.8 kg)   SpO2 97%   BMI 23.68 kg/m   Visual Acuity Right Eye Distance:   Left Eye Distance:   Bilateral Distance:    Right Eye Near:   Left Eye Near:    Bilateral Near:     Physical Exam  Pulmonary/Chest:  Tenderness bilateral anterior/inferior chest, more pronounced on the right.     Nursing notes and Vital Signs reviewed. Appearance:  Patient appears stated age, and in no acute distress Eyes:  Pupils are equal, round, and reactive to light and accomodation.  Extraocular movement is intact.  Conjunctivae are not inflamed  Ears:  Canals normal.  Tympanic membranes normal.  Nose:  Mildly congested turbinates.  No sinus tenderness.   Pharynx:  Normal Neck:  Supple.  Enlarged posterior/lateral nodes are palpated bilaterally, tender to palpation on the left.   Lungs:  Clear to auscultation.  Breath sounds are equal.  Moving air well.  There is bilateral tenderness over costal margins as noted on diagram. Heart:  Regular rate and rhythm without murmurs, rubs, or gallops.  Abdomen:  Nontender without masses or hepatosplenomegaly.  Bowel sounds are present.  No CVA or flank tenderness.  Extremities:  No edema.  Skin:  No rash present.    UC Treatments / Results  Labs (all labs ordered are listed, but only abnormal results are displayed) Labs Reviewed  POCT INFLUENZA A/B negative    EKG None Radiology Dg  Chest 2 View  Result Date: 04/25/2017 CLINICAL DATA:  72 year-old male c/o non-productive cough w/ clear sputum and fever x 3-4 days. Hx of HTN EXAM: CHEST - 2 VIEW COMPARISON:  09/20/2016 FINDINGS: Heart size is normal. There are no focal consolidations or pleural effusions. No pulmonary edema. IMPRESSION: No active cardiopulmonary disease. Electronically Signed   By: Nolon Nations M.D.   On: 04/25/2017 15:12  Procedures Procedures (including critical care time)  Medications Ordered in UC Medications - No data to display   Initial Impression / Assessment and Plan / UC Course  I have reviewed the triage vital signs and the nursing notes.  Pertinent labs & imaging results that were available during my care of the patient were reviewed by me and considered in my medical decision making (see chart for details).    There is no evidence of bacterial infection today.  Treat symptomatically for now  Take plain guaifenesin (1200mg  extended release tabs such as Mucinex) twice daily, with plenty of water, for cough and congestion.  Get adequate rest.   May use Afrin nasal spray (or generic oxymetazoline) each morning for about 5 days and then discontinue.  Also recommend using saline nasal spray several times daily and saline nasal irrigation (AYR is a common brand).  Use Atrovent nasal spray each morning after using Afrin nasal spray and saline nasal irrigation. Try warm salt water gargles for sore throat.  Stop all antihistamines for now, and other non-prescription cough/cold preparations. May take Tylenol for body aches, fever, etc. May take Delsym Cough Suppressant at bedtime for nighttime cough.  Begin Azithromycin if not improving about one week or if persistent fever develops (Given a prescription to hold, with an expiration date)  Followup with Family Doctor if not improved in about 10 days.    Final Clinical Impressions(s) / UC Diagnoses   Final diagnoses:  Viral URI with cough     ED Discharge Orders        Ordered    azithromycin (ZITHROMAX Z-PAK) 250 MG tablet     04/25/17 1540          Kandra Nicolas, MD 05/01/17 [phone]

## 2017-04-26 ENCOUNTER — Encounter: Payer: Self-pay | Admitting: Physician Assistant

## 2017-04-29 ENCOUNTER — Emergency Department (HOSPITAL_COMMUNITY)
Admission: EM | Admit: 2017-04-29 | Discharge: 2017-04-30 | Disposition: A | Discharge status: Home / Self Care | Payer: 59 | Attending: Emergency Medicine | Admitting: Emergency Medicine

## 2017-04-29 ENCOUNTER — Other Ambulatory Visit: Payer: Self-pay

## 2017-04-29 ENCOUNTER — Emergency Department (HOSPITAL_COMMUNITY): Payer: 59

## 2017-04-29 ENCOUNTER — Encounter (HOSPITAL_COMMUNITY): Payer: Self-pay | Admitting: Emergency Medicine

## 2017-04-29 DIAGNOSIS — R509 Fever, unspecified: Secondary | ICD-10-CM | POA: Insufficient documentation

## 2017-04-29 DIAGNOSIS — Z5321 Procedure and treatment not carried out due to patient leaving prior to being seen by health care provider: Secondary | ICD-10-CM | POA: Insufficient documentation

## 2017-04-29 LAB — URINALYSIS, ROUTINE W REFLEX MICROSCOPIC
Bacteria, UA: NONE SEEN
Bilirubin Urine: NEGATIVE
Glucose, UA: NEGATIVE mg/dL
Ketones, ur: NEGATIVE mg/dL
Leukocytes, UA: NEGATIVE
Nitrite: NEGATIVE
Protein, ur: NEGATIVE mg/dL
Specific Gravity, Urine: 1.023 (ref 1.005–1.030)
pH: 5 (ref 5.0–8.0)

## 2017-04-29 LAB — I-STAT TROPONIN, ED: Troponin i, poc: 0.01 ng/mL (ref 0.00–0.08)

## 2017-04-29 LAB — CBC WITH DIFFERENTIAL/PLATELET
Basophils Absolute: 0 10*3/uL (ref 0.0–0.1)
Basophils Relative: 0 %
Eosinophils Absolute: 0 10*3/uL (ref 0.0–0.7)
Eosinophils Relative: 0 %
HCT: 36.6 % — ABNORMAL LOW (ref 39.0–52.0)
Hemoglobin: 12.4 g/dL — ABNORMAL LOW (ref 13.0–17.0)
Lymphocytes Relative: 8 %
Lymphs Abs: 0.6 10*3/uL — ABNORMAL LOW (ref 0.7–4.0)
MCH: 29.3 pg (ref 26.0–34.0)
MCHC: 33.9 g/dL (ref 30.0–36.0)
MCV: 86.5 fL (ref 78.0–100.0)
Monocytes Absolute: 0.6 10*3/uL (ref 0.1–1.0)
Monocytes Relative: 8 %
Neutro Abs: 6.1 10*3/uL (ref 1.7–7.7)
Neutrophils Relative %: 84 %
Platelets: 260 10*3/uL (ref 150–400)
RBC: 4.23 MIL/uL (ref 4.22–5.81)
RDW: 12.5 % (ref 11.5–15.5)
WBC: 7.3 10*3/uL (ref 4.0–10.5)

## 2017-04-29 LAB — COMPREHENSIVE METABOLIC PANEL
ALT: 18 U/L (ref 17–63)
AST: 23 U/L (ref 15–41)
Albumin: 3.1 g/dL — ABNORMAL LOW (ref 3.5–5.0)
Alkaline Phosphatase: 69 U/L (ref 38–126)
Anion gap: 8 (ref 5–15)
BUN: 15 mg/dL (ref 6–20)
CO2: 21 mmol/L — ABNORMAL LOW (ref 22–32)
Calcium: 8.9 mg/dL (ref 8.9–10.3)
Chloride: 106 mmol/L (ref 101–111)
Creatinine, Ser: 1.3 mg/dL — ABNORMAL HIGH (ref 0.61–1.24)
GFR calc Af Amer: >60 mL/min (ref >60)
GFR calc non Af Amer: 54 mL/min — ABNORMAL LOW (ref >60)
Glucose, Bld: 116 mg/dL — ABNORMAL HIGH (ref 65–99)
Potassium: 3.5 mmol/L (ref 3.5–5.1)
Sodium: 135 mmol/L (ref 135–145)
Total Bilirubin: 0.8 mg/dL (ref 0.3–1.2)
Total Protein: 6.5 g/dL (ref 6.5–8.1)

## 2017-04-29 LAB — I-STAT CG4 LACTIC ACID, ED: Lactic Acid, Venous: 1.12 mmol/L (ref 0.5–1.9)

## 2017-04-29 NOTE — ED Triage Notes (Addendum)
Pt reports high fever since last Friday. Pt complains of cough and high heart rate. Seen by UC and diagnosed with viral illness. Pt denies N/V, abdominal pain. Pt reports chest tightness and shortness of breath. Pt last took tylenol at 2000

## 2017-04-29 NOTE — ED Provider Notes (Addendum)
Patient placed in Quick Look pathway, seen and evaluated   Chief Complaint: Fever, cough  HPI:   Patient is a 72 year old male with history of hypertension who presents with a 6-day history of cough, shortness of breath, fever.  Patient was seen at urgent care 5 days ago and had a negative chest x-ray, but was given a azithromycin.  He has completed it, however continues to have fever up to 102 at home.  He has had associated chest pain and shortness of breath.  His cough is productive.  He denies any sore throat or ear pain at this time.  He has been taking Tylenol at home for the fever as well as Mucinex.  ROS: Positive for chest pain, shortness of breath, cough, fever.  Negative for abdominal pain, nausea, vomiting, ear pain, sore throat.  Physical Exam:   Gen: No distress  Neuro: Awake and Alert  Skin: Warm    Focused Exam: Heart normal rhythm, however tachycardia, rales in the left base, otherwise clear to auscultation, abdomen is soft, nontender  CBC, CMP, lactate, UA, troponin, EKG, chest x-ray ordered from triage.  Initiation of care has begun. The patient has been counseled on the process, plan, and necessity for staying for the completion/evaluation, and the remainder of the medical screening examination    Frederica Kuster, PA-C 04/29/17 2102     Frederica Kuster, PA-C 05/11/17 1635    Duffy Bruce, MD 05/13/17 [phone]

## 2017-04-29 NOTE — ED Notes (Signed)
Results reviewed.  No changes in acuity at this time 

## 2017-04-30 ENCOUNTER — Ambulatory Visit: Payer: 59 | Admitting: Physician Assistant

## 2017-04-30 ENCOUNTER — Encounter: Payer: Self-pay | Admitting: Physician Assistant

## 2017-04-30 ENCOUNTER — Telehealth: Payer: Self-pay

## 2017-04-30 VITALS — BP 138/76 | HR 86 | Temp 97.7°F | Resp 16 | Ht 71.0 in | Wt 170.0 lb

## 2017-04-30 DIAGNOSIS — J189 Pneumonia, unspecified organism: Secondary | ICD-10-CM

## 2017-04-30 DIAGNOSIS — J181 Lobar pneumonia, unspecified organism: Secondary | ICD-10-CM

## 2017-04-30 MED ORDER — FLUTICASONE PROPIONATE 50 MCG/ACT NA SUSP: 2.0000 | Freq: Every day | NASAL | 0 refills | Status: DC

## 2017-04-30 MED ORDER — BENZONATATE 100 MG PO CAPS: 100.0000 mg | ORAL_CAPSULE | Freq: Two times a day (BID) | ORAL | 0 refills | Status: DC | PRN

## 2017-04-30 MED ORDER — LEVOFLOXACIN 500 MG PO TABS: 500.0000 mg | ORAL_TABLET | Freq: Every day | ORAL | 0 refills | Status: DC

## 2017-04-30 NOTE — Patient Instructions (Addendum)
Please stay well-hydrated and get plenty of rest.  Take the plain Mucinex twice daily with fluids.  Use the Tessalon to help with cough.  Take the antibiotic once daily as directed (Levaquin).  The Flonase nasal spray will help with ear pressure/fluid build up.  If you note any worsening symptoms or fever is not staying away after being on the antibiotic for 48 hours, please return to the ER.  Do not delay care.

## 2017-04-30 NOTE — Progress Notes (Signed)
Patient presents to clinic today for management of CAP. Patient was contacted to come in for assessment after ER notes were received. Patient went to the ER yesterday evening and received triage. Was being seen for URI symptoms and high fever despite treatment for bronchitis (from UC) with Azithromycin. It seems was evaluated by APP during triage assessment and was sent for labs and CXR. Patient left before being seen as they were told it would be a 6 hour wait after waiting 3 hours. CXR came back revealing a pneumonia in the left lower lobe. Patient was not contacted by ER regarding results and need for treatment.   Past Medical History:  Diagnosis Date  . Allergy   . GERD (gastroesophageal reflux disease)   . History of chickenpox   . History of diverticulitis 2007  . History of kidney stones   . Hypertension     Current Outpatient Medications on File Prior to Visit  Medication Sig Dispense Refill  . amphetamine-dextroamphetamine (ADDERALL) 10 MG tablet Take 1 tablet (10 mg total) by mouth daily with breakfast. 30 tablet 0  . amphetamine-dextroamphetamine (ADDERALL) 5 MG tablet Take 1 tablet (5 mg total) by mouth daily. 60 tablet 0  . aspirin EC 81 MG tablet Take 81 mg by mouth daily.    Marland Kitchen atorvastatin (LIPITOR) 40 MG tablet Take 1 tablet (40 mg total) by mouth daily. 90 tablet 1  . ipratropium (ATROVENT) 0.03 % nasal spray Place 2 sprays into both nostrils every 12 (twelve) hours.    Marland Kitchen lisinopril (PRINIVIL,ZESTRIL) 20 MG tablet Take 1 tablet (20 mg total) by mouth daily. 90 tablet 3  . Multiple Vitamin (MULTIVITAMIN) tablet Take 1 tablet by mouth daily.    . pantoprazole (PROTONIX) 40 MG tablet Take 1 tablet (40 mg total) by mouth daily. 90 tablet 1  . tamsulosin (FLOMAX) 0.4 MG CAPS capsule Take 1 capsule (0.4 mg total) by mouth daily after supper. 90 capsule 1  . traMADol (ULTRAM) 50 MG tablet Take 1 tablet (50 mg total) by mouth daily. 30 tablet 0  . vitamin B-12 (CYANOCOBALAMIN)  100 MCG tablet Take 1 tablet (100 mcg total) by mouth daily. 30 tablet 0  . vitamin C (ASCORBIC ACID) 500 MG tablet Take 500 mg by mouth daily.    . Vitamin D, Cholecalciferol, 1000 units CAPS Take 1 capsule by mouth daily. 60 capsule 0  . Vitamin D, Ergocalciferol, (DRISDOL) 50000 units CAPS capsule Take 1 capsule (50,000 Units total) by mouth every 7 (seven) days. 8 capsule 0   No current facility-administered medications on file prior to visit.     Allergies  Allergen Reactions  . Other Other (See Comments)    Amoxetine causing: unknown  . Shellfish Allergy Hives    Family History  Problem Relation Age of Onset  . Heart disease Mother   . Hypertension Mother   . Stroke Mother     Social History   Socioeconomic History  . Marital status: Married    Spouse name: Not on file  . Number of children: Not on file  . Years of education: Not on file  . Highest education level: Not on file  Occupational History  . Not on file  Social Needs  . Financial resource strain: Not on file  . Food insecurity:    Worry: Not on file    Inability: Not on file  . Transportation needs:    Medical: Not on file    Non-medical: Not on file  Tobacco  Use  . Smoking status: Former Smoker  . Smokeless tobacco: Never Used  Substance and Sexual Activity  . Alcohol use: No  . Drug use: No  . Sexual activity: Yes  Lifestyle  . Physical activity:    Days per week: Not on file    Minutes per session: Not on file  . Stress: Not on file  Relationships  . Social connections:    Talks on phone: Not on file    Gets together: Not on file    Attends religious service: Not on file    Active member of club or organization: Not on file    Attends meetings of clubs or organizations: Not on file    Relationship status: Not on file  Other Topics Concern  . Not on file  Social History Narrative  . Not on file   Review of Systems - See HPI.  All other ROS are negative.  BP 138/76   Pulse 86    Temp 97.7 F (36.5 C) (Oral)   Resp 16   Ht 5' 11"  (1.803 m)   Wt 170 lb (77.1 kg)   SpO2 97%   BMI 23.71 kg/m   Physical Exam  Constitutional: He appears well-developed and well-nourished.  HENT:  Head: Normocephalic and atraumatic.  Right Ear: External ear normal. Tympanic membrane is retracted (mild retraction of R TM noted.).  Left Ear: Tympanic membrane and external ear normal.  Nose: Nose normal.  Mouth/Throat: Oropharynx is clear and moist.  Cardiovascular: Normal rate, regular rhythm and normal heart sounds.  Pulmonary/Chest: Effort normal. No stridor. No respiratory distress. He has no wheezes. He has rales (LLL). He exhibits no tenderness.  Skin: Skin is warm. No rash noted.  Vitals reviewed.  Recent Results (from the past 2160 hour(s))  CBC w/Diff     Status: None   Collection Time: 03/16/17  3:04 PM  Result Value Ref Range   WBC 4.1 4.0 - 10.5 K/uL   RBC 4.77 4.22 - 5.81 Mil/uL   Hemoglobin 14.6 13.0 - 17.0 g/dL   HCT 42.6 39.0 - 52.0 %   MCV 89.2 78.0 - 100.0 fl   MCHC 34.3 30.0 - 36.0 g/dL   RDW 13.0 11.5 - 15.5 %   Platelets 236.0 150.0 - 400.0 K/uL   Neutrophils Relative % 57.5 43.0 - 77.0 %   Lymphocytes Relative 31.3 12.0 - 46.0 %   Monocytes Relative 8.0 3.0 - 12.0 %   Eosinophils Relative 2.6 0.0 - 5.0 %   Basophils Relative 0.6 0.0 - 3.0 %   Neutro Abs 2.4 1.4 - 7.7 K/uL   Lymphs Abs 1.3 0.7 - 4.0 K/uL   Monocytes Absolute 0.3 0.1 - 1.0 K/uL   Eosinophils Absolute 0.1 0.0 - 0.7 K/uL   Basophils Absolute 0.0 0.0 - 0.1 K/uL  Comp Met (CMET)     Status: None   Collection Time: 03/16/17  3:04 PM  Result Value Ref Range   Sodium 143 135 - 145 mEq/L   Potassium 4.1 3.5 - 5.1 mEq/L   Chloride 108 96 - 112 mEq/L   CO2 28 19 - 32 mEq/L   Glucose, Bld 84 70 - 99 mg/dL   BUN 18 6 - 23 mg/dL   Creatinine, Ser 1.21 0.40 - 1.50 mg/dL   Total Bilirubin 0.3 0.2 - 1.2 mg/dL   Alkaline Phosphatase 84 39 - 117 U/L   AST 21 0 - 37 U/L   ALT 21 0 - 53 U/L  Total Protein 6.4 6.0 - 8.3 g/dL   Albumin 3.6 3.5 - 5.2 g/dL   Calcium 9.7 8.4 - 10.5 mg/dL   GFR 63.13 >60.00 mL/min  Lipid panel     Status: Abnormal   Collection Time: 03/16/17  3:04 PM  Result Value Ref Range   Cholesterol 121 0 - 200 mg/dL    Comment: ATP III Classification       Desirable:  < 200 mg/dL               Borderline High:  200 - 239 mg/dL          High:  > = 240 mg/dL   Triglycerides 137.0 0.0 - 149.0 mg/dL    Comment: Normal:  <150 mg/dLBorderline High:  150 - 199 mg/dL   HDL 30.70 (L) >39.00 mg/dL   VLDL 27.4 0.0 - 40.0 mg/dL   LDL Cholesterol 63 0 - 99 mg/dL   Total CHOL/HDL Ratio 4     Comment:                Men          Women1/2 Average Risk     3.4          3.3Average Risk          5.0          4.42X Average Risk          9.6          7.13X Average Risk          15.0          11.0                       NonHDL 90.75     Comment: NOTE:  Non-HDL goal should be 30 mg/dL higher than patient's LDL goal (i.e. LDL goal of < 70 mg/dL, would have non-HDL goal of < 100 mg/dL)  TSH     Status: None   Collection Time: 03/16/17  3:04 PM  Result Value Ref Range   TSH 2.92 0.35 - 4.50 uIU/mL  B12 and Folate Panel     Status: Abnormal   Collection Time: 03/16/17  3:04 PM  Result Value Ref Range   Vitamin B-12 189 (L) 211 - 911 pg/mL   Folate 19.1 >5.9 ng/mL  Vitamin D (25 hydroxy)     Status: Abnormal   Collection Time: 03/16/17  3:04 PM  Result Value Ref Range   VITD 21.23 (L) 30.00 - 100.00 ng/mL  Urinalysis, Routine w reflex microscopic     Status: Abnormal   Collection Time: 04/29/17  9:04 PM  Result Value Ref Range   Color, Urine YELLOW YELLOW   APPearance CLEAR CLEAR   Specific Gravity, Urine 1.023 1.005 - 1.030   pH 5.0 5.0 - 8.0   Glucose, UA NEGATIVE NEGATIVE mg/dL   Hgb urine dipstick SMALL (A) NEGATIVE   Bilirubin Urine NEGATIVE NEGATIVE   Ketones, ur NEGATIVE NEGATIVE mg/dL   Protein, ur NEGATIVE NEGATIVE mg/dL   Nitrite NEGATIVE NEGATIVE   Leukocytes,  UA NEGATIVE NEGATIVE   RBC / HPF 0-5 0 - 5 RBC/hpf   WBC, UA 0-5 0 - 5 WBC/hpf   Bacteria, UA NONE SEEN NONE SEEN   Mucus PRESENT    Hyaline Casts, UA PRESENT     Comment: Performed at [facility], [address], [place], [place] [address]  Comprehensive metabolic panel  Status: Abnormal   Collection Time: 04/29/17  9:08 PM  Result Value Ref Range   Sodium 135 135 - 145 mmol/L   Potassium 3.5 3.5 - 5.1 mmol/L   Chloride 106 101 - 111 mmol/L   CO2 21 (L) 22 - 32 mmol/L   Glucose, Bld 116 (H) 65 - 99 mg/dL   BUN 15 6 - 20 mg/dL   Creatinine, Ser 1.30 (H) 0.61 - 1.24 mg/dL   Calcium 8.9 8.9 - 10.3 mg/dL   Total Protein 6.5 6.5 - 8.1 g/dL   Albumin 3.1 (L) 3.5 - 5.0 g/dL   AST 23 15 - 41 U/L   ALT 18 17 - 63 U/L   Alkaline Phosphatase 69 38 - 126 U/L   Total Bilirubin 0.8 0.3 - 1.2 mg/dL   GFR calc non Af Amer 54 (L) >60 mL/min   GFR calc Af Amer >60 >60 mL/min    Comment: (NOTE) The eGFR has been calculated using the CKD EPI equation. This calculation has not been validated in all clinical situations. eGFR's persistently <60 mL/min signify possible Chronic Kidney Disease.    Anion gap 8 5 - 15    Comment: Performed at [facility] [address], [place], [place] [address]  CBC with Differential     Status: Abnormal   Collection Time: 04/29/17  9:08 PM  Result Value Ref Range   WBC 7.3 4.0 - 10.5 K/uL   RBC 4.23 4.22 - 5.81 MIL/uL   Hemoglobin 12.4 (L) 13.0 - 17.0 g/dL   HCT 36.6 (L) 39.0 - 52.0 %   MCV 86.5 78.0 - 100.0 fL   MCH 29.3 26.0 - 34.0 pg   MCHC 33.9 30.0 - 36.0 g/dL   RDW 12.5 11.5 - 15.5 %   Platelets 260 150 - 400 K/uL   Neutrophils Relative % 84 %   Neutro Abs 6.1 1.7 - 7.7 K/uL   Lymphocytes Relative 8 %   Lymphs Abs 0.6 (L) 0.7 - 4.0 K/uL   Monocytes Relative 8 %   Monocytes Absolute 0.6 0.1 - 1.0 K/uL   Eosinophils Relative 0 %   Eosinophils Absolute 0.0 0.0 - 0.7 K/uL   Basophils Relative 0 %   Basophils Absolute 0.0 0.0 - 0.1  K/uL    Comment: Performed at [facility] [address], [place], [place] [address]  I-stat troponin, ED     Status: None   Collection Time: 04/29/17  9:23 PM  Result Value Ref Range   Troponin i, poc 0.01 0.00 - 0.08 ng/mL   Comment 3            Comment: Due to the release kinetics of cTnI, a negative result within the first hours of the onset of symptoms does not rule out myocardial infarction with certainty. If myocardial infarction is still suspected, repeat the test at appropriate intervals.   I-Stat CG4 Lactic Acid, ED     Status: None   Collection Time: 04/29/17  9:24 PM  Result Value Ref Range   Lactic Acid, Venous 1.12 0.5 - 1.9 mmol/L   Assessment/Plan: 1. Community acquired pneumonia of left lower lobe of lung ([place]) Noted on CXR in ER. Left before final evaluation and treatment. Decrease lung sounds of LLL. Afebrile currently. Non-toxic appearing. Will start Levaquin 500 mg x 7 days. Start Tessalon and [employer]. OTC medications reviewed. ER for any worsening of symptoms.  - fluticasone (FLONASE) 50 MCG/ACT nasal spray; Place 2 sprays into both  nostrils daily.  Dispense: 16 g; Refill: 0 - levofloxacin (LEVAQUIN) 500 MG tablet; Take 1 tablet (500 mg total) by mouth daily.  Dispense: 7 tablet; Refill: 0 - benzonatate (TESSALON) 100 MG capsule; Take 1 capsule (100 mg total) by mouth 2 (two) times daily as needed for cough.  Dispense: 20 capsule; Refill: 0 d  Leeanne Rio, PA-C

## 2017-04-30 NOTE — Telephone Encounter (Signed)
SW patients wife, [name].  Advised Candy that patient needs to be evaluated today for treatment. She states patient cannot leave work before 5pm for the next week. She will take patient to Urgent Care after work today.

## 2017-04-30 NOTE — Telephone Encounter (Signed)
LM requesting call back. Per PCP, patient needs ER f/u today for treatment. Patient LWBS after CXR.

## 2017-04-30 NOTE — ED Notes (Signed)
Pt and family state they are no longer willing to wait. RN encouraged to stay multiple times. LWBS.

## 2017-05-01 LAB — POCT INFLUENZA A/B
Influenza A, POC: NEGATIVE
Influenza B, POC: NEGATIVE

## 2017-05-02 ENCOUNTER — Emergency Department (HOSPITAL_COMMUNITY)
Admission: EM | Admit: 2017-05-02 | Discharge: 2017-05-02 | Disposition: A | Discharge status: Home / Self Care | Payer: 59 | Attending: Emergency Medicine | Admitting: Emergency Medicine

## 2017-05-02 ENCOUNTER — Emergency Department (HOSPITAL_COMMUNITY): Payer: 59

## 2017-05-02 ENCOUNTER — Encounter (HOSPITAL_COMMUNITY): Payer: Self-pay | Admitting: Emergency Medicine

## 2017-05-02 DIAGNOSIS — R42 Dizziness and giddiness: Secondary | ICD-10-CM

## 2017-05-02 DIAGNOSIS — Z87891 Personal history of nicotine dependence: Secondary | ICD-10-CM | POA: Insufficient documentation

## 2017-05-02 DIAGNOSIS — Z7982 Long term (current) use of aspirin: Secondary | ICD-10-CM | POA: Diagnosis not present

## 2017-05-02 DIAGNOSIS — I10 Essential (primary) hypertension: Secondary | ICD-10-CM | POA: Insufficient documentation

## 2017-05-02 DIAGNOSIS — Z79899 Other long term (current) drug therapy: Secondary | ICD-10-CM | POA: Diagnosis not present

## 2017-05-02 DIAGNOSIS — E860 Dehydration: Secondary | ICD-10-CM | POA: Diagnosis not present

## 2017-05-02 DIAGNOSIS — R112 Nausea with vomiting, unspecified: Secondary | ICD-10-CM | POA: Insufficient documentation

## 2017-05-02 LAB — CBC WITH DIFFERENTIAL/PLATELET
Basophils Absolute: 0 10*3/uL (ref 0.0–0.1)
Basophils Relative: 0 %
Eosinophils Absolute: 0.1 10*3/uL (ref 0.0–0.7)
Eosinophils Relative: 1 %
HCT: 40.9 % (ref 39.0–52.0)
Hemoglobin: 14.1 g/dL (ref 13.0–17.0)
Lymphocytes Relative: 21 %
Lymphs Abs: 1.3 10*3/uL (ref 0.7–4.0)
MCH: 30.1 pg (ref 26.0–34.0)
MCHC: 34.5 g/dL (ref 30.0–36.0)
MCV: 87.2 fL (ref 78.0–100.0)
Monocytes Absolute: 0.4 10*3/uL (ref 0.1–1.0)
Monocytes Relative: 7 %
Neutro Abs: 4.3 10*3/uL (ref 1.7–7.7)
Neutrophils Relative %: 71 %
Platelets: 350 10*3/uL (ref 150–400)
RBC: 4.69 MIL/uL (ref 4.22–5.81)
RDW: 12.9 % (ref 11.5–15.5)
WBC: 6.1 10*3/uL (ref 4.0–10.5)

## 2017-05-02 LAB — COMPREHENSIVE METABOLIC PANEL
ALT: 16 U/L — ABNORMAL LOW (ref 17–63)
AST: 21 U/L (ref 15–41)
Albumin: 3 g/dL — ABNORMAL LOW (ref 3.5–5.0)
Alkaline Phosphatase: 68 U/L (ref 38–126)
Anion gap: 10 (ref 5–15)
BUN: 10 mg/dL (ref 6–20)
CO2: 23 mmol/L (ref 22–32)
Calcium: 9.3 mg/dL (ref 8.9–10.3)
Chloride: 105 mmol/L (ref 101–111)
Creatinine, Ser: 1.22 mg/dL (ref 0.61–1.24)
GFR calc Af Amer: >60 mL/min (ref >60)
GFR calc non Af Amer: 59 mL/min — ABNORMAL LOW (ref >60)
Glucose, Bld: 110 mg/dL — ABNORMAL HIGH (ref 65–99)
Potassium: 4.2 mmol/L (ref 3.5–5.1)
Sodium: 138 mmol/L (ref 135–145)
Total Bilirubin: 0.7 mg/dL (ref 0.3–1.2)
Total Protein: 7 g/dL (ref 6.5–8.1)

## 2017-05-02 LAB — URINALYSIS, ROUTINE W REFLEX MICROSCOPIC
Bilirubin Urine: NEGATIVE
Glucose, UA: NEGATIVE mg/dL
Hgb urine dipstick: NEGATIVE
Ketones, ur: NEGATIVE mg/dL
Leukocytes, UA: NEGATIVE
Nitrite: NEGATIVE
Protein, ur: NEGATIVE mg/dL
Specific Gravity, Urine: 1.016 (ref 1.005–1.030)
pH: 5 (ref 5.0–8.0)

## 2017-05-02 LAB — LIPASE, BLOOD: Lipase: 26 U/L (ref 11–51)

## 2017-05-02 LAB — I-STAT CG4 LACTIC ACID, ED: Lactic Acid, Venous: 1.17 mmol/L (ref 0.5–1.9)

## 2017-05-02 MED ORDER — ONDANSETRON HCL 4 MG/2ML IJ SOLN
4.0000 mg | Freq: Once | INTRAMUSCULAR | Status: AC
  Administered 2017-05-02: 4 mg via INTRAVENOUS
  Filled 2017-05-02: qty 2

## 2017-05-02 MED ORDER — SODIUM CHLORIDE 0.9 % IV BOLUS
1000.0000 mL | Freq: Once | INTRAVENOUS | Status: AC
  Administered 2017-05-02: 1000 mL via INTRAVENOUS

## 2017-05-02 MED ORDER — MECLIZINE HCL 25 MG PO TABS
12.5000 mg | ORAL_TABLET | Freq: Once | ORAL | Status: AC
  Administered 2017-05-02: 12.5 mg via ORAL
  Filled 2017-05-02: qty 1

## 2017-05-02 MED ORDER — ONDANSETRON 4 MG PO TBDP: 4.0000 mg | ORAL_TABLET | Freq: Once | ORAL | Status: DC | PRN

## 2017-05-02 MED ORDER — MECLIZINE HCL 25 MG PO TABS: 25.0000 mg | ORAL_TABLET | Freq: Three times a day (TID) | ORAL | 0 refills | Status: DC | PRN

## 2017-05-02 MED ORDER — ONDANSETRON 4 MG PO TBDP: 4.0000 mg | ORAL_TABLET | Freq: Three times a day (TID) | ORAL | 0 refills | Status: DC | PRN

## 2017-05-02 MED ORDER — DOXYCYCLINE HYCLATE 100 MG PO CAPS: 100.0000 mg | ORAL_CAPSULE | Freq: Two times a day (BID) | ORAL | 0 refills | Status: AC

## 2017-05-02 NOTE — ED Notes (Signed)
Pt called nurse to report, no relief from Meclizine.

## 2017-05-02 NOTE — ED Triage Notes (Signed)
Pt had diagnosis of pneumonia from 24th. Is on day 3 of antibiotics. Today he began having nausea and emesis X 3. Afebrile.

## 2017-05-02 NOTE — Discharge Instructions (Signed)
Make sure you are drinking plenty of water.  You should be drinking enough so that your urine is clear to pale yellow. Take antibiotics as prescribed.  Take the entire course, even if your symptoms improve.  Stop taking levofloxacin. Use Zofran as needed for nausea or vomiting. Use meclizine as needed for dizziness. Follow-up with your primary care doctor on Monday for symptoms are not improving. Return to the emergency room if you develop vision changes, slurred speech, weakness, severe headache, or any new or concerning symptoms.

## 2017-05-02 NOTE — ED Provider Notes (Addendum)
[facility] EMERGENCY DEPARTMENT Provider Note   CSN: [id number] Arrival date & time: 05/02/17  1249     History   Chief Complaint Chief Complaint  Patient presents with  . Pneumonia  . Emesis    HPI Gary [name] is a 72 y.o. male presenting for evaluation of nausea, vomiting, dizziness.  Patient states that he developed URI symptoms last Wednesday.  He was initially placed on azithromycin, but had no improvement of sxs. CXR on 4/24 showed PNA, he was started on levaquin. Pt states he has had 2 doses. He felt nauseated the first time he took it, took second dose yesterday before bed. This morning he work up feeling sweaty, nauseated and dizzy. Threw up x3. He threw up again in the ER while waiting. He reports he is not having fevers, CP, or SOB. Cough is unchanged. He denies abd pain, urinary sxs, or abnormal BMs. He has a h/o GERD, kidney stones, and HTN.    HPI  Past Medical History:  Diagnosis Date  . Allergy   . GERD (gastroesophageal reflux disease)   . History of chickenpox   . History of diverticulitis 2007  . History of kidney stones   . Hypertension     Patient Active Problem List   Diagnosis Date Noted  . Hypertension 03/24/2017  . History of nephrolithiasis 03/24/2017  . Hyperlipidemia 03/24/2017  . Diverticulosis 03/24/2017    No past surgical history on file.      Home Medications    Prior to Admission medications   Medication Sig Start Date End Date Taking? Authorizing Provider  acetaminophen (TYLENOL) 500 MG tablet Take 1,000 mg by mouth every 6 (six) hours as needed for fever or headache (pain).   Yes [provider]  amphetamine-dextroamphetamine (ADDERALL XR) 10 MG 24 hr capsule Take 10 mg by mouth See admin instructions. Take one tablet (10 mg) by mouth daily on work days   Yes [provider]  amphetamine-dextroamphetamine (ADDERALL) 10 MG tablet Take 1 tablet (10 mg total) by mouth daily with  breakfast. Patient taking differently: Take 5 mg by mouth See admin instructions. Take 1/2 tablet (5 mg) by mouth daily on work days 04/01/17  Yes Delorse Limber  aspirin EC 81 MG tablet Take 81 mg by mouth at bedtime.    Yes [provider]  atorvastatin (LIPITOR) 40 MG tablet Take 1 tablet (40 mg total) by mouth daily. Patient taking differently: Take 40 mg by mouth at bedtime.  04/01/17  Yes Brunetta Jeans, PA-C  benzonatate (TESSALON) 100 MG capsule Take 1 capsule (100 mg total) by mouth 2 (two) times daily as needed for cough. 04/30/17  Yes Brunetta Jeans, PA-C  EPINEPHrine (EPIPEN 2-PAK) 0.3 mg/0.3 mL IJ SOAJ injection Inject 0.3 mg into the muscle once as needed (severe allergic reaction).   Yes [provider]  fluticasone (FLONASE) 50 MCG/ACT nasal spray Place 2 sprays into both nostrils daily. Patient taking differently: Place 2 sprays into both nostrils daily as needed (congestion).  04/30/17  Yes Brunetta Jeans, PA-C  levofloxacin (LEVAQUIN) 500 MG tablet Take 1 tablet (500 mg total) by mouth daily. 04/30/17  Yes Brunetta Jeans, PA-C  lisinopril (PRINIVIL,ZESTRIL) 20 MG tablet Take 1 tablet (20 mg total) by mouth daily. Patient taking differently: Take 20 mg by mouth at bedtime.  04/01/17  Yes Brunetta Jeans, PA-C  Multiple Vitamin (MULTIVITAMIN WITH MINERALS) TABS tablet Take 1 tablet by mouth daily.  Yes [provider]  pantoprazole (PROTONIX) 40 MG tablet Take 1 tablet (40 mg total) by mouth daily. Patient taking differently: Take 40 mg by mouth at bedtime.  04/01/17  Yes Brunetta Jeans, PA-C  tamsulosin (FLOMAX) 0.4 MG CAPS capsule Take 1 capsule (0.4 mg total) by mouth daily after supper. Patient taking differently: Take 0.4 mg by mouth at bedtime.  04/01/17  Yes Brunetta Jeans, PA-C  traMADol (ULTRAM) 50 MG tablet Take 1 tablet (50 mg total) by mouth daily. Patient taking differently: Take 50 mg by mouth at bedtime.  04/01/17  Yes  Brunetta Jeans, PA-C  vitamin B-12 (CYANOCOBALAMIN) 100 MCG tablet Take 1 tablet (100 mcg total) by mouth daily. 03/18/17  Yes Brunetta Jeans, PA-C  vitamin C (ASCORBIC ACID) 500 MG tablet Take 500 mg by mouth daily.   Yes [provider]  Vitamin D, Cholecalciferol, 1000 units CAPS Take 1 capsule by mouth daily. Patient taking differently: Take 1,000 Units by mouth every other day.  03/18/17  Yes Brunetta Jeans, PA-C  amphetamine-dextroamphetamine (ADDERALL) 5 MG tablet Take 1 tablet (5 mg total) by mouth daily. Patient not taking: Reported on 05/02/2017 04/01/17   Brunetta Jeans, PA-C  doxycycline (VIBRAMYCIN) 100 MG capsule Take 1 capsule (100 mg total) by mouth 2 (two) times daily for 7 days. 05/02/17 05/09/17  Aneliese Beaudry, PA-C  meclizine (ANTIVERT) 25 MG tablet Take 1 tablet (25 mg total) by mouth 3 (three) times daily as needed for dizziness. 05/02/17   Kein Carlberg, PA-C  ondansetron (ZOFRAN ODT) 4 MG disintegrating tablet Take 1 tablet (4 mg total) by mouth every 8 (eight) hours as needed for nausea or vomiting. 05/02/17   Buffey Zabinski, PA-C  Vitamin D, Ergocalciferol, (DRISDOL) 50000 units CAPS capsule Take 1 capsule (50,000 Units total) by mouth every 7 (seven) days. Patient not taking: Reported on 05/02/2017 03/18/17   Brunetta Jeans, PA-C    Family History Family History  Problem Relation Age of Onset  . Heart disease Mother   . Hypertension Mother   . Stroke Mother     Social History Social History   Tobacco Use  . Smoking status: Former Research scientist (life sciences)  . Smokeless tobacco: Never Used  Substance Use Topics  . Alcohol use: No  . Drug use: No     Allergies   Strattera [atomoxetine hcl] and Shellfish allergy   Review of Systems Review of Systems  Respiratory: Positive for cough.   Gastrointestinal: Positive for nausea and vomiting.  Neurological: Positive for dizziness.  All other systems reviewed and are negative.    Physical  Exam Updated Vital Signs BP 134/72   Pulse 80   Temp 98.9 F (37.2 C) (Oral)   Resp 17   Ht 5' 10.5" (1.791 m)   Wt 70.3 kg (155 lb)   SpO2 98%   BMI 21.93 kg/m   Physical Exam  Constitutional: He is oriented to person, place, and time. He appears well-developed and well-nourished. No distress.  Pt in NAD  HENT:  Head: Normocephalic and atraumatic.  Right Ear: Tympanic membrane, external ear and ear canal normal.  Left Ear: Tympanic membrane, external ear and ear canal normal.  Nose: Nose normal.  Mouth/Throat: Uvula is midline, oropharynx is clear and moist and mucous membranes are normal.  Eyes: Pupils are equal, round, and reactive to light. Conjunctivae and EOM are normal.  EOMI and PERRLA. No nystagmus  Neck: Normal range of motion. Neck supple.  Cardiovascular: Normal rate,  regular rhythm and intact distal pulses.  Pulmonary/Chest: Effort normal and breath sounds normal. No respiratory distress. He has no wheezes.  Speaking in full sentences. Clear lung sounds  Abdominal: Soft. He exhibits no distension and no mass. There is no tenderness. There is no rebound and no guarding.  Musculoskeletal: Normal range of motion.  No leg pain or swelling  Neurological: He is alert and oriented to person, place, and time. He has normal strength. No cranial nerve deficit or sensory deficit. He displays a negative Romberg sign. Gait abnormal. Coordination normal. GCS eye subscore is 4. GCS verbal subscore is 5. GCS motor subscore is 6.  Pt with abnormal gait due toe dizziness. Negative romberg and pronator drift. CNs intact. Strength and sensation intact x4.   Skin: Skin is warm and dry.  Psychiatric: He has a normal mood and affect.  Nursing note and vitals reviewed.    ED Treatments / Results  Labs (all labs ordered are listed, but only abnormal results are displayed) Labs Reviewed  COMPREHENSIVE METABOLIC PANEL - Abnormal; Notable for the following components:      Result Value    Glucose, Bld 110 (*)    Albumin 3.0 (*)    ALT 16 (*)    GFR calc non Af Amer 59 (*)    All other components within normal limits  LIPASE, BLOOD  CBC WITH DIFFERENTIAL/PLATELET  URINALYSIS, ROUTINE W REFLEX MICROSCOPIC  I-STAT CG4 LACTIC ACID, ED    EKG EKG Interpretation  Date/Time:  Saturday May 02 2017 13:16:56 EDT Ventricular Rate:  88 PR Interval:  136 QRS Duration: 88 QT Interval:  382 QTC Calculation: 462 R Axis:   37 Text Interpretation:  Normal sinus rhythm Normal ECG Confirmed by Veryl Speak [phone]) on 05/03/2017 12:51:10 AM   Radiology Dg Chest 2 View  Result Date: 05/02/2017 CLINICAL DATA:  Cough, dizziness, and vomiting for 2 days. Pneumonia. EXAM: CHEST - 2 VIEW COMPARISON:  04/29/2017 FINDINGS: The heart size and mediastinal contours are within normal limits. There has been resolution of previously seen infiltrate in the posterior left lower lobe since prior study. No other areas of pulmonary opacity are seen. No evidence of pleural effusion. The visualized skeletal structures are unremarkable. IMPRESSION: Resolution of left lower lobe infiltrate since previous study. No active lung disease. Electronically Signed   By: Earle Gell M.D.   On: 05/02/2017 14:49   Ct Head Wo Contrast  Result Date: 05/02/2017 CLINICAL DATA:  Patient on antibiotics for pneumonia. Nausea and vomiting. EXAM: CT HEAD WITHOUT CONTRAST TECHNIQUE: Contiguous axial images were obtained from the base of the skull through the vertex without intravenous contrast. COMPARISON:  September 20, 2016 FINDINGS: Brain: No evidence of acute infarction, hemorrhage, hydrocephalus, extra-axial collection or mass lesion/mass effect. Vascular: No hyperdense vessel or unexpected calcification. Skull: Normal. Negative for fracture or focal lesion. Sinuses/Orbits: Complete opacification of the left maxillary sinus, unchanged. Fluid and mucosal thickening in the right maxillary sinus is new since September 2018.  Opacification of ethmoid air cells is also new. Right mastoid air cells and middle ears are normal. Fluid scattered throughout left mastoid air cells. No bony erosion or overlying soft tissue swelling. Other: None. IMPRESSION: 1. Sinus disease as above.  Findings concerning for acute sinusitis. 2. No acute intracranial abnormality identified. Electronically Signed   By: Dorise Bullion III M.D   On: 05/02/2017 17:18    Procedures Procedures (including critical care time)  Medications Ordered in ED Medications  ondansetron (ZOFRAN) injection 4  mg (4 mg Intravenous Given 05/02/17 1610)  sodium chloride 0.9 % bolus 1,000 mL (0 mLs Intravenous Stopped 05/02/17 1647)  meclizine (ANTIVERT) tablet 12.5 mg (12.5 mg Oral Given 05/02/17 1555)  meclizine (ANTIVERT) tablet 12.5 mg (12.5 mg Oral Given 05/02/17 1811)  ondansetron (ZOFRAN) injection 4 mg (4 mg Intravenous Given 05/02/17 1901)     Initial Impression / Assessment and Plan / ED Course  I have reviewed the triage vital signs and the nursing notes.  Pertinent labs & imaging results that were available during my care of the patient were reviewed by me and considered in my medical decision making (see chart for details).     Pt presenting for evaluation of dizziness, nausea, and vomiting.  Physical exam shows patient who is in no distress, he is afebrile not tachycardic.  He appears nontoxic.  Labs reassuring, electrolytes stable, creatinine stable.  No leukocytosis.  Lactic negative.  UA without infection.  CBC and lipase reassuring.  X-ray viewed and interpreted by me, shows resolved pneumonia.  EKG without signs of STEMI.  On exam, gait is unsteady but not ataxic. Will obtain CT head, but doubt CVA at this time. Will give IVF, meclizine and zofran for sx control.   CT head negative. Discussed option of MRI, but pt and wife do not want MRI today. [name] wife states these sxs are typical for him when he is dehydrated, and he often gets dehydrated. He  has h/o vertigo. At this time, I feel his sxs are likely due to medication side effects and dehydration. Orthostatics saw mild decrease in BP and elevation of HR, consistent with dehydration. tolerating PO. Discussed with pt and wife. Discussed sx treatment and importance of hydration. F/u with PCP on Monday for reevaluation. At this time, pt appears safe for d/c. Return precautions given. Pt states he understands and agrees to plan.   Final Clinical Impressions(s) / ED Diagnoses   Final diagnoses:  Dehydration  Dizziness  Non-intractable vomiting with nausea, unspecified vomiting type    ED Discharge Orders        Ordered    doxycycline (VIBRAMYCIN) 100 MG capsule  2 times daily     05/02/17 1758    ondansetron (ZOFRAN ODT) 4 MG disintegrating tablet  Every 8 hours PRN     05/02/17 1758    meclizine (ANTIVERT) 25 MG tablet  3 times daily PRN     05/02/17 1758       Franchot Heidelberg, PA-C 05/03/17 0052    Agastya Meister, PA-C 05/03/17 1191    Orlie Dakin, MD 05/04/17 [phone]

## 2017-05-04 ENCOUNTER — Telehealth: Payer: Self-pay

## 2017-05-04 ENCOUNTER — Other Ambulatory Visit: Payer: Self-pay | Admitting: Emergency Medicine

## 2017-05-04 MED ORDER — AMPHETAMINE-DEXTROAMPHET ER 10 MG PO CP24: 10.0000 mg | ORAL_CAPSULE | ORAL | 0 refills | Status: DC

## 2017-05-04 MED ORDER — AMPHETAMINE-DEXTROAMPHETAMINE 10 MG PO TABS: 10.0000 mg | ORAL_TABLET | Freq: Every day | ORAL | 0 refills | Status: DC

## 2017-05-04 NOTE — Telephone Encounter (Signed)
Spoke with patients wife, [name].  She states patient cannot come in this week for ER f/u due to work/school schedule. Candy reports patient was feeling better this morning. Encouraged to have patient drink plenty of fluids and call with any concerns. Scheduled for ER f/u with PCP on Monday, 05/11/17.

## 2017-05-06 ENCOUNTER — Ambulatory Visit: Payer: 59 | Admitting: Physician Assistant

## 2017-05-11 ENCOUNTER — Encounter: Payer: Self-pay | Admitting: Physician Assistant

## 2017-05-11 ENCOUNTER — Ambulatory Visit: Payer: 59 | Admitting: Physician Assistant

## 2017-05-11 ENCOUNTER — Other Ambulatory Visit: Payer: Self-pay

## 2017-05-11 VITALS — BP 110/60 | HR 77 | Temp 97.5°F | Resp 14 | Ht 71.0 in | Wt 167.0 lb

## 2017-05-11 DIAGNOSIS — E559 Vitamin D deficiency, unspecified: Secondary | ICD-10-CM

## 2017-05-11 DIAGNOSIS — G4750 Parasomnia, unspecified: Secondary | ICD-10-CM

## 2017-05-11 DIAGNOSIS — J189 Pneumonia, unspecified organism: Secondary | ICD-10-CM

## 2017-05-11 DIAGNOSIS — I10 Essential (primary) hypertension: Secondary | ICD-10-CM

## 2017-05-11 LAB — VITAMIN D 25 HYDROXY (VIT D DEFICIENCY, FRACTURES): VITD: 40.06 ng/mL (ref 30.00–100.00)

## 2017-05-11 NOTE — Progress Notes (Signed)
Patient presents to clinic today for ER follow-up. Patient was seen in the ER on 05/01/17 after noting side effect of nausea and vomiting with the Levaquin he was taking for CAP. ER workup included unremarkable labs, negative cxr and head ct negative for acute abnormality but showed signs consistent with sinusitis. Patient was hydrated with IV fluids and Levaquin was discontinued. Patient was discharged to complete a course of Doxycycline.   Since discharge, patient endorses completing entire course of medication. Notes only a mild residual cough that is mainly dry but when productive, is productive of clear sputum. Denies fever, chills, chest tightness or SOB. Energy levels are improving daily.  Denies new concerns.   Patient is overdue for repeat Vitamin D levels after Rx Ergocalciferol completed. Patient also notes he has not heard from sleep specialist regarding parasomnias.   Past Medical History:  Diagnosis Date  . Allergy   . GERD (gastroesophageal reflux disease)   . History of chickenpox   . History of diverticulitis 2007  . History of kidney stones   . Hypertension     Current Outpatient Medications on File Prior to Visit  Medication Sig Dispense Refill  . acetaminophen (TYLENOL) 500 MG tablet Take 1,000 mg by mouth every 6 (six) hours as needed for fever or headache (pain).    Marland Kitchen amphetamine-dextroamphetamine (ADDERALL XR) 10 MG 24 hr capsule Take 1 capsule (10 mg total) by mouth See admin instructions. Take one tablet (10 mg) by mouth daily on work days 90 capsule 0  . amphetamine-dextroamphetamine (ADDERALL) 10 MG tablet Take 1 tablet (10 mg total) by mouth daily with breakfast. 90 tablet 0  . aspirin EC 81 MG tablet Take 81 mg by mouth at bedtime.     Marland Kitchen atorvastatin (LIPITOR) 40 MG tablet Take 1 tablet (40 mg total) by mouth daily. (Patient taking differently: Take 40 mg by mouth at bedtime. ) 90 tablet 1  . EPINEPHrine (EPIPEN 2-PAK) 0.3 mg/0.3 mL IJ SOAJ injection Inject 0.3  mg into the muscle once as needed (severe allergic reaction).    Marland Kitchen lisinopril (PRINIVIL,ZESTRIL) 20 MG tablet Take 1 tablet (20 mg total) by mouth daily. (Patient taking differently: Take 20 mg by mouth at bedtime. ) 90 tablet 3  . Multiple Vitamin (MULTIVITAMIN WITH MINERALS) TABS tablet Take 1 tablet by mouth daily.    . pantoprazole (PROTONIX) 40 MG tablet Take 1 tablet (40 mg total) by mouth daily. (Patient taking differently: Take 40 mg by mouth at bedtime. ) 90 tablet 1  . tamsulosin (FLOMAX) 0.4 MG CAPS capsule Take 1 capsule (0.4 mg total) by mouth daily after supper. (Patient taking differently: Take 0.4 mg by mouth at bedtime. ) 90 capsule 1  . traMADol (ULTRAM) 50 MG tablet Take 1 tablet (50 mg total) by mouth daily. (Patient taking differently: Take 50 mg by mouth at bedtime. ) 30 tablet 0  . vitamin B-12 (CYANOCOBALAMIN) 100 MCG tablet Take 1 tablet (100 mcg total) by mouth daily. 30 tablet 0  . vitamin C (ASCORBIC ACID) 500 MG tablet Take 500 mg by mouth daily.    . Vitamin D, Cholecalciferol, 1000 units CAPS Take 1 capsule by mouth daily. (Patient taking differently: Take 1,000 Units by mouth every other day. ) 60 capsule 0  . fluticasone (FLONASE) 50 MCG/ACT nasal spray Place 2 sprays into both nostrils daily. (Patient not taking: Reported on 05/11/2017) 16 g 0   No current facility-administered medications on file prior to visit.  Allergies  Allergen Reactions  . Levaquin [Levofloxacin] Nausea And Vomiting  . Strattera [Atomoxetine Hcl] Other (See Comments)    Per wife it was ineffective but she does not remember a reaction  . Shellfish Allergy Hives    Family History  Problem Relation Age of Onset  . Heart disease Mother   . Hypertension Mother   . Stroke Mother     Social History   Socioeconomic History  . Marital status: Married    Spouse name: Not on file  . Number of children: Not on file  . Years of education: Not on file  . Highest education level: Not on  file  Occupational History  . Not on file  Social Needs  . Financial resource strain: Not on file  . Food insecurity:    Worry: Not on file    Inability: Not on file  . Transportation needs:    Medical: Not on file    Non-medical: Not on file  Tobacco Use  . Smoking status: Former Research scientist (life sciences)  . Smokeless tobacco: Never Used  Substance and Sexual Activity  . Alcohol use: No  . Drug use: No  . Sexual activity: Yes  Lifestyle  . Physical activity:    Days per week: Not on file    Minutes per session: Not on file  . Stress: Not on file  Relationships  . Social connections:    Talks on phone: Not on file    Gets together: Not on file    Attends religious service: Not on file    Active member of club or organization: Not on file    Attends meetings of clubs or organizations: Not on file    Relationship status: Not on file  Other Topics Concern  . Not on file  Social History Narrative  . Not on file   Review of Systems - See HPI.  All other ROS are negative.  BP 110/60   Pulse 77   Temp (!) 97.5 F (36.4 C) (Oral)   Resp 14   Ht 5' 11"  (1.803 m)   Wt 167 lb (75.8 kg)   SpO2 98%   BMI 23.29 kg/m   Physical Exam  Constitutional: He is oriented to person, place, and time. He appears well-developed and well-nourished.  HENT:  Head: Normocephalic and atraumatic.  Eyes: Conjunctivae are normal.  Neck: Neck supple.  Cardiovascular: Normal rate, regular rhythm, normal heart sounds and intact distal pulses.  Neurological: He is alert and oriented to person, place, and time.  Vitals reviewed.   Recent Results (from the past 2160 hour(s))  CBC w/Diff     Status: None   Collection Time: 03/16/17  3:04 PM  Result Value Ref Range   WBC 4.1 4.0 - 10.5 K/uL   RBC 4.77 4.22 - 5.81 Mil/uL   Hemoglobin 14.6 13.0 - 17.0 g/dL   HCT 42.6 39.0 - 52.0 %   MCV 89.2 78.0 - 100.0 fl   MCHC 34.3 30.0 - 36.0 g/dL   RDW 13.0 11.5 - 15.5 %   Platelets 236.0 150.0 - 400.0 K/uL    Neutrophils Relative % 57.5 43.0 - 77.0 %   Lymphocytes Relative 31.3 12.0 - 46.0 %   Monocytes Relative 8.0 3.0 - 12.0 %   Eosinophils Relative 2.6 0.0 - 5.0 %   Basophils Relative 0.6 0.0 - 3.0 %   Neutro Abs 2.4 1.4 - 7.7 K/uL   Lymphs Abs 1.3 0.7 - 4.0 K/uL   Monocytes Absolute 0.3  0.1 - 1.0 K/uL   Eosinophils Absolute 0.1 0.0 - 0.7 K/uL   Basophils Absolute 0.0 0.0 - 0.1 K/uL  Comp Met (CMET)     Status: None   Collection Time: 03/16/17  3:04 PM  Result Value Ref Range   Sodium 143 135 - 145 mEq/L   Potassium 4.1 3.5 - 5.1 mEq/L   Chloride 108 96 - 112 mEq/L   CO2 28 19 - 32 mEq/L   Glucose, Bld 84 70 - 99 mg/dL   BUN 18 6 - 23 mg/dL   Creatinine, Ser 1.21 0.40 - 1.50 mg/dL   Total Bilirubin 0.3 0.2 - 1.2 mg/dL   Alkaline Phosphatase 84 39 - 117 U/L   AST 21 0 - 37 U/L   ALT 21 0 - 53 U/L   Total Protein 6.4 6.0 - 8.3 g/dL   Albumin 3.6 3.5 - 5.2 g/dL   Calcium 9.7 8.4 - 10.5 mg/dL   GFR 63.13 >60.00 mL/min  Lipid panel     Status: Abnormal   Collection Time: 03/16/17  3:04 PM  Result Value Ref Range   Cholesterol 121 0 - 200 mg/dL    Comment: ATP III Classification       Desirable:  < 200 mg/dL               Borderline High:  200 - 239 mg/dL          High:  > = 240 mg/dL   Triglycerides 137.0 0.0 - 149.0 mg/dL    Comment: Normal:  <150 mg/dLBorderline High:  150 - 199 mg/dL   HDL 30.70 (L) >39.00 mg/dL   VLDL 27.4 0.0 - 40.0 mg/dL   LDL Cholesterol 63 0 - 99 mg/dL   Total CHOL/HDL Ratio 4     Comment:                Men          Women1/2 Average Risk     3.4          3.3Average Risk          5.0          4.42X Average Risk          9.6          7.13X Average Risk          15.0          11.0                       NonHDL 90.75     Comment: NOTE:  Non-HDL goal should be 30 mg/dL higher than patient's LDL goal (i.e. LDL goal of < 70 mg/dL, would have non-HDL goal of < 100 mg/dL)  TSH     Status: None   Collection Time: 03/16/17  3:04 PM  Result Value Ref Range   TSH  2.92 0.35 - 4.50 uIU/mL  B12 and Folate Panel     Status: Abnormal   Collection Time: 03/16/17  3:04 PM  Result Value Ref Range   Vitamin B-12 189 (L) 211 - 911 pg/mL   Folate 19.1 >5.9 ng/mL  Vitamin D (25 hydroxy)     Status: Abnormal   Collection Time: 03/16/17  3:04 PM  Result Value Ref Range   VITD 21.23 (L) 30.00 - 100.00 ng/mL  POCT Influenza A/B     Status: None   Collection Time: 04/25/17  3:04 PM  Result Value Ref Range  Influenza A, POC Negative Negative   Influenza B, POC Negative Negative  Urinalysis, Routine w reflex microscopic     Status: Abnormal   Collection Time: 04/29/17  9:04 PM  Result Value Ref Range   Color, Urine YELLOW YELLOW   APPearance CLEAR CLEAR   Specific Gravity, Urine 1.023 1.005 - 1.030   pH 5.0 5.0 - 8.0   Glucose, UA NEGATIVE NEGATIVE mg/dL   Hgb urine dipstick SMALL (A) NEGATIVE   Bilirubin Urine NEGATIVE NEGATIVE   Ketones, ur NEGATIVE NEGATIVE mg/dL   Protein, ur NEGATIVE NEGATIVE mg/dL   Nitrite NEGATIVE NEGATIVE   Leukocytes, UA NEGATIVE NEGATIVE   RBC / HPF 0-5 0 - 5 RBC/hpf   WBC, UA 0-5 0 - 5 WBC/hpf   Bacteria, UA NONE SEEN NONE SEEN   Mucus PRESENT    Hyaline Casts, UA PRESENT     Comment: Performed at [facility], [address] [place], [place] [address]  Comprehensive metabolic panel     Status: Abnormal   Collection Time: 04/29/17  9:08 PM  Result Value Ref Range   Sodium 135 135 - 145 mmol/L   Potassium 3.5 3.5 - 5.1 mmol/L   Chloride 106 101 - 111 mmol/L   CO2 21 (L) 22 - 32 mmol/L   Glucose, Bld 116 (H) 65 - 99 mg/dL   BUN 15 6 - 20 mg/dL   Creatinine, Ser 1.30 (H) 0.61 - 1.24 mg/dL   Calcium 8.9 8.9 - 10.3 mg/dL   Total Protein 6.5 6.5 - 8.1 g/dL   Albumin 3.1 (L) 3.5 - 5.0 g/dL   AST 23 15 - 41 U/L   ALT 18 17 - 63 U/L   Alkaline Phosphatase 69 38 - 126 U/L   Total Bilirubin 0.8 0.3 - 1.2 mg/dL   GFR calc non Af Amer 54 (L) >60 mL/min   GFR calc Af Amer >60 >60 mL/min    Comment: (NOTE) The eGFR  has been calculated using the CKD EPI equation. This calculation has not been validated in all clinical situations. eGFR's persistently <60 mL/min signify possible Chronic Kidney Disease.    Anion gap 8 5 - 15    Comment: Performed at [facility] [address], [place], [place] [address]  CBC with Differential     Status: Abnormal   Collection Time: 04/29/17  9:08 PM  Result Value Ref Range   WBC 7.3 4.0 - 10.5 K/uL   RBC 4.23 4.22 - 5.81 MIL/uL   Hemoglobin 12.4 (L) 13.0 - 17.0 g/dL   HCT 36.6 (L) 39.0 - 52.0 %   MCV 86.5 78.0 - 100.0 fL   MCH 29.3 26.0 - 34.0 pg   MCHC 33.9 30.0 - 36.0 g/dL   RDW 12.5 11.5 - 15.5 %   Platelets 260 150 - 400 K/uL   Neutrophils Relative % 84 %   Neutro Abs 6.1 1.7 - 7.7 K/uL   Lymphocytes Relative 8 %   Lymphs Abs 0.6 (L) 0.7 - 4.0 K/uL   Monocytes Relative 8 %   Monocytes Absolute 0.6 0.1 - 1.0 K/uL   Eosinophils Relative 0 %   Eosinophils Absolute 0.0 0.0 - 0.7 K/uL   Basophils Relative 0 %   Basophils Absolute 0.0 0.0 - 0.1 K/uL    Comment: Performed at [facility] [address], [place], [place] [address]  I-stat troponin, ED     Status: None   Collection Time: 04/29/17  9:23 PM  Result Value Ref Range   Troponin i, poc 0.01 0.00 - 0.08 ng/mL   Comment 3            Comment: Due to the release kinetics of cTnI, a negative result within the first hours of the onset of symptoms does not rule out myocardial infarction with certainty. If myocardial infarction is still suspected, repeat the test at appropriate intervals.   I-Stat CG4 Lactic Acid, ED     Status: None   Collection Time: 04/29/17  9:24 PM  Result Value Ref Range   Lactic Acid, Venous 1.12 0.5 - 1.9 mmol/L  Lipase, blood     Status: None   Collection Time: 05/02/17  1:42 PM  Result Value Ref Range   Lipase 26 11 - 51 U/L    Comment: Performed at [facility], [place] [address], [place], [place] [address]  Comprehensive metabolic panel     Status:  Abnormal   Collection Time: 05/02/17  1:42 PM  Result Value Ref Range   Sodium 138 135 - 145 mmol/L   Potassium 4.2 3.5 - 5.1 mmol/L   Chloride 105 101 - 111 mmol/L   CO2 23 22 - 32 mmol/L   Glucose, Bld 110 (H) 65 - 99 mg/dL   BUN 10 6 - 20 mg/dL   Creatinine, Ser 1.22 0.61 - 1.24 mg/dL   Calcium 9.3 8.9 - 10.3 mg/dL   Total Protein 7.0 6.5 - 8.1 g/dL   Albumin 3.0 (L) 3.5 - 5.0 g/dL   AST 21 15 - 41 U/L   ALT 16 (L) 17 - 63 U/L   Alkaline Phosphatase 68 38 - 126 U/L   Total Bilirubin 0.7 0.3 - 1.2 mg/dL   GFR calc non Af Amer 59 (L) >60 mL/min   GFR calc Af Amer >60 >60 mL/min    Comment: (NOTE) The eGFR has been calculated using the CKD EPI equation. This calculation has not been validated in all clinical situations. eGFR's persistently <60 mL/min signify possible Chronic Kidney Disease.    Anion gap 10 5 - 15    Comment: Performed at [facility] [address], [place], [place] [address]  CBC with Differential     Status: None   Collection Time: 05/02/17  1:42 PM  Result Value Ref Range   WBC 6.1 4.0 - 10.5 K/uL   RBC 4.69 4.22 - 5.81 MIL/uL   Hemoglobin 14.1 13.0 - 17.0 g/dL   HCT 40.9 39.0 - 52.0 %   MCV 87.2 78.0 - 100.0 fL   MCH 30.1 26.0 - 34.0 pg   MCHC 34.5 30.0 - 36.0 g/dL   RDW 12.9 11.5 - 15.5 %   Platelets 350 150 - 400 K/uL   Neutrophils Relative % 71 %   Lymphocytes Relative 21 %   Monocytes Relative 7 %   Eosinophils Relative 1 %   Basophils Relative 0 %   Neutro Abs 4.3 1.7 - 7.7 K/uL   Lymphs Abs 1.3 0.7 - 4.0 K/uL   Monocytes Absolute 0.4 0.1 - 1.0 K/uL   Eosinophils Absolute 0.1 0.0 - 0.7 K/uL   Basophils Absolute 0.0 0.0 - 0.1 K/uL   WBC Morphology ATYPICAL LYMPHOCYTES     Comment: Performed at [name], [place] [address], [place], [place] [address]  Urinalysis, Routine w reflex microscopic     Status: None   Collection Time: 05/02/17  1:43 PM  Result Value Ref Range   Color, Urine  YELLOW YELLOW   APPearance CLEAR CLEAR    Specific Gravity, Urine 1.016 1.005 - 1.030   pH 5.0 5.0 - 8.0   Glucose, UA NEGATIVE NEGATIVE mg/dL   Hgb urine dipstick NEGATIVE NEGATIVE   Bilirubin Urine NEGATIVE NEGATIVE   Ketones, ur NEGATIVE NEGATIVE mg/dL   Protein, ur NEGATIVE NEGATIVE mg/dL   Nitrite NEGATIVE NEGATIVE   Leukocytes, UA NEGATIVE NEGATIVE    Comment: Performed at [facility] [address], [place], [place] [address]  I-Stat CG4 Lactic Acid, ED     Status: None   Collection Time: 05/02/17  2:01 PM  Result Value Ref Range   Lactic Acid, Venous 1.17 0.5 - 1.9 mmol/L    Assessment/Plan: 1. Vitamin D deficiency Repeat Vitamin D today. - Vitamin D (25 hydroxy)  2. Community acquired pneumonia, unspecified laterality Clinically resolved. Supportive measures and OTC medications reviewed. Strict return precautions given.   3. Parasomnia, unspecified type Referral to Pulm (Sleep medicine) for further evaluation.  - Ambulatory referral to Pulmonology   Leeanne Rio, PA-C

## 2017-05-11 NOTE — Patient Instructions (Signed)
Please go to the lab today for blood work.  I will call you with your results. We will alter treatment regimen(s) if indicated by your results.   Continue the Flonase. Add on a Claritin daily. (not claritin-d due to the pulse).  If not improving within a week or so, will need ENT assessment.   I have placed a new referral to a sleep specialist.  I want their input before changing regimen further.  Keep well-hydrated and keep an eye on pulse rate over the next 1-2 weeks. If consistently elevated above 100 while resting, please call us and we will restart Atenolol at a low-dose while waiting for you to see the sleep specialist.

## 2017-05-12 MED ORDER — TRAMADOL HCL 50 MG PO TABS: 50.0000 mg | ORAL_TABLET | Freq: Every day | ORAL | 0 refills | Status: DC | PRN

## 2017-05-12 MED ORDER — ATORVASTATIN CALCIUM 40 MG PO TABS: 40.0000 mg | ORAL_TABLET | Freq: Every day | ORAL | 1 refills | Status: DC

## 2017-05-12 MED ORDER — LISINOPRIL 20 MG PO TABS: 20.0000 mg | ORAL_TABLET | Freq: Every day | ORAL | 1 refills | Status: DC

## 2017-07-13 ENCOUNTER — Encounter: Payer: Self-pay | Admitting: Physician Assistant

## 2017-07-13 ENCOUNTER — Ambulatory Visit: Payer: 59 | Attending: Physician Assistant | Admitting: Audiology

## 2017-07-13 ENCOUNTER — Other Ambulatory Visit: Payer: Self-pay

## 2017-07-13 ENCOUNTER — Ambulatory Visit: Payer: 59 | Admitting: Physician Assistant

## 2017-07-13 VITALS — BP 140/80 | HR 77 | Temp 97.9°F | Resp 16 | Ht 71.0 in | Wt 173.0 lb

## 2017-07-13 DIAGNOSIS — H93213 Auditory recruitment, bilateral: Secondary | ICD-10-CM | POA: Insufficient documentation

## 2017-07-13 DIAGNOSIS — H9190 Unspecified hearing loss, unspecified ear: Secondary | ICD-10-CM

## 2017-07-13 DIAGNOSIS — M545 Low back pain, unspecified: Secondary | ICD-10-CM

## 2017-07-13 DIAGNOSIS — H93299 Other abnormal auditory perceptions, unspecified ear: Secondary | ICD-10-CM | POA: Diagnosis present

## 2017-07-13 DIAGNOSIS — H903 Sensorineural hearing loss, bilateral: Secondary | ICD-10-CM

## 2017-07-13 DIAGNOSIS — H918X9 Other specified hearing loss, unspecified ear: Secondary | ICD-10-CM | POA: Insufficient documentation

## 2017-07-13 MED ORDER — TRAMADOL HCL 50 MG PO TABS: 50.0000 mg | ORAL_TABLET | Freq: Every day | ORAL | 0 refills | Status: DC | PRN

## 2017-07-13 MED ORDER — CYCLOBENZAPRINE HCL 10 MG PO TABS
10.0000 mg | ORAL_TABLET | Freq: Three times a day (TID) | ORAL | 0 refills | Status: DC | PRN
Start: 2017-07-13 — End: 2017-08-13

## 2017-07-13 MED ORDER — METHYLPREDNISOLONE 4 MG PO TBPK: ORAL_TABLET | ORAL | 0 refills | Status: DC

## 2017-07-13 NOTE — Progress Notes (Signed)
Patient presents to clinic today c/o 1 week of bilateral low back pain. Pain is sharp and present with range of motion. Denies trauma, injury or episode of heavy lifting. Denies any radiation of pain into lower extremities. Pain is 5-6/10 on average. Denies N/V, change to bladder/bowels, saddle anesthesia, extremity weakness.  Past Medical History:  Diagnosis Date  . Allergy   . GERD (gastroesophageal reflux disease)   . History of chickenpox   . History of diverticulitis 2007  . History of kidney stones   . Hypertension     Current Outpatient Medications on File Prior to Visit  Medication Sig Dispense Refill  . acetaminophen (TYLENOL) 500 MG tablet Take 1,000 mg by mouth every 6 (six) hours as needed for fever or headache (pain).    Marland Kitchen amphetamine-dextroamphetamine (ADDERALL XR) 10 MG 24 hr capsule Take 1 capsule (10 mg total) by mouth See admin instructions. Take one tablet (10 mg) by mouth daily on work days 90 capsule 0  . amphetamine-dextroamphetamine (ADDERALL) 10 MG tablet Take 1 tablet (10 mg total) by mouth daily with breakfast. 90 tablet 0  . aspirin EC 81 MG tablet Take 81 mg by mouth at bedtime.     Marland Kitchen atorvastatin (LIPITOR) 40 MG tablet Take 1 tablet (40 mg total) by mouth at bedtime. 90 tablet 1  . EPINEPHrine (EPIPEN 2-PAK) 0.3 mg/0.3 mL IJ SOAJ injection Inject 0.3 mg into the muscle once as needed (severe allergic reaction).    . fluticasone (FLONASE) 50 MCG/ACT nasal spray Place 2 sprays into both nostrils daily. 16 g 0  . lisinopril (PRINIVIL,ZESTRIL) 20 MG tablet Take 1 tablet (20 mg total) by mouth at bedtime. 90 tablet 1  . Multiple Vitamin (MULTIVITAMIN WITH MINERALS) TABS tablet Take 1 tablet by mouth daily.    . pantoprazole (PROTONIX) 40 MG tablet Take 1 tablet (40 mg total) by mouth daily. (Patient taking differently: Take 40 mg by mouth at bedtime. ) 90 tablet 1  . tamsulosin (FLOMAX) 0.4 MG CAPS capsule Take 1 capsule (0.4 mg total) by mouth daily after supper.  (Patient taking differently: Take 0.4 mg by mouth at bedtime. ) 90 capsule 1  . vitamin B-12 (CYANOCOBALAMIN) 100 MCG tablet Take 1 tablet (100 mcg total) by mouth daily. 30 tablet 0  . vitamin C (ASCORBIC ACID) 500 MG tablet Take 500 mg by mouth daily.    . Vitamin D, Cholecalciferol, 1000 units CAPS Take 1 capsule by mouth daily. (Patient taking differently: Take 1,000 Units by mouth every other day. ) 60 capsule 0   No current facility-administered medications on file prior to visit.     Allergies  Allergen Reactions  . Levaquin [Levofloxacin] Nausea And Vomiting  . Strattera [Atomoxetine Hcl] Other (See Comments)    Per wife it was ineffective but she does not remember a reaction  . Shellfish Allergy Hives    Family History  Problem Relation Age of Onset  . Heart disease Mother   . Hypertension Mother   . Stroke Mother     Social History   Socioeconomic History  . Marital status: Married    Spouse name: Not on file  . Number of children: Not on file  . Years of education: Not on file  . Highest education level: Not on file  Occupational History  . Not on file  Social Needs  . Financial resource strain: Not on file  . Food insecurity:    Worry: Not on file    Inability: Not  on file  . Transportation needs:    Medical: Not on file    Non-medical: Not on file  Tobacco Use  . Smoking status: Former Research scientist (life sciences)  . Smokeless tobacco: Never Used  Substance and Sexual Activity  . Alcohol use: No  . Drug use: No  . Sexual activity: Yes  Lifestyle  . Physical activity:    Days per week: Not on file    Minutes per session: Not on file  . Stress: Not on file  Relationships  . Social connections:    Talks on phone: Not on file    Gets together: Not on file    Attends religious service: Not on file    Active member of club or organization: Not on file    Attends meetings of clubs or organizations: Not on file    Relationship status: Not on file  Other Topics Concern  .  Not on file  Social History Narrative  . Not on file   Review of Systems - See HPI.  All other ROS are negative.  BP 140/80   Pulse 77   Temp 97.9 F (36.6 C) (Oral)   Resp 16   Ht 5' 11"  (1.803 m)   Wt 173 lb (78.5 kg)   SpO2 98%   BMI 24.13 kg/m   Physical Exam  Constitutional: He appears well-developed and well-nourished.  HENT:  Head: Normocephalic and atraumatic.  Eyes: Conjunctivae are normal.  Neck: Neck supple.  Cardiovascular: Normal rate, regular rhythm, normal heart sounds and intact distal pulses.  Pulmonary/Chest: Effort normal and breath sounds normal.  Abdominal: He exhibits no distension. There is no tenderness.  Musculoskeletal:       Thoracic back: Normal.       Lumbar back: He exhibits tenderness, pain and spasm. He exhibits no bony tenderness.  Lymphadenopathy:    He has no cervical adenopathy.  Vitals reviewed.   Recent Results (from the past 2160 hour(s))  POCT Influenza A/B     Status: None   Collection Time: 04/25/17  3:04 PM  Result Value Ref Range   Influenza A, POC Negative Negative   Influenza B, POC Negative Negative  Urinalysis, Routine w reflex microscopic     Status: Abnormal   Collection Time: 04/29/17  9:04 PM  Result Value Ref Range   Color, Urine YELLOW YELLOW   APPearance CLEAR CLEAR   Specific Gravity, Urine 1.023 1.005 - 1.030   pH 5.0 5.0 - 8.0   Glucose, UA NEGATIVE NEGATIVE mg/dL   Hgb urine dipstick SMALL (A) NEGATIVE   Bilirubin Urine NEGATIVE NEGATIVE   Ketones, ur NEGATIVE NEGATIVE mg/dL   Protein, ur NEGATIVE NEGATIVE mg/dL   Nitrite NEGATIVE NEGATIVE   Leukocytes, UA NEGATIVE NEGATIVE   RBC / HPF 0-5 0 - 5 RBC/hpf   WBC, UA 0-5 0 - 5 WBC/hpf   Bacteria, UA NONE SEEN NONE SEEN   Mucus PRESENT    Hyaline Casts, UA PRESENT     Comment: Performed at [facility], [address] [place], [place] [address]  Comprehensive metabolic panel     Status: Abnormal   Collection Time: 04/29/17  9:08 PM  Result Value  Ref Range   Sodium 135 135 - 145 mmol/L   Potassium 3.5 3.5 - 5.1 mmol/L   Chloride 106 101 - 111 mmol/L   CO2 21 (L) 22 - 32 mmol/L   Glucose, Bld 116 (H) 65 - 99 mg/dL   BUN 15 6 - 20 mg/dL  Creatinine, Ser 1.30 (H) 0.61 - 1.24 mg/dL   Calcium 8.9 8.9 - 10.3 mg/dL   Total Protein 6.5 6.5 - 8.1 g/dL   Albumin 3.1 (L) 3.5 - 5.0 g/dL   AST 23 15 - 41 U/L   ALT 18 17 - 63 U/L   Alkaline Phosphatase 69 38 - 126 U/L   Total Bilirubin 0.8 0.3 - 1.2 mg/dL   GFR calc non Af Amer 54 (L) >60 mL/min   GFR calc Af Amer >60 >60 mL/min    Comment: (NOTE) The eGFR has been calculated using the CKD EPI equation. This calculation has not been validated in all clinical situations. eGFR's persistently <60 mL/min signify possible Chronic Kidney Disease.    Anion gap 8 5 - 15    Comment: Performed at [name] [address] [place], [place] [address]  CBC with Differential     Status: Abnormal   Collection Time: 04/29/17  9:08 PM  Result Value Ref Range   WBC 7.3 4.0 - 10.5 K/uL   RBC 4.23 4.22 - 5.81 MIL/uL   Hemoglobin 12.4 (L) 13.0 - 17.0 g/dL   HCT 36.6 (L) 39.0 - 52.0 %   MCV 86.5 78.0 - 100.0 fL   MCH 29.3 26.0 - 34.0 pg   MCHC 33.9 30.0 - 36.0 g/dL   RDW 12.5 11.5 - 15.5 %   Platelets 260 150 - 400 K/uL   Neutrophils Relative % 84 %   Neutro Abs 6.1 1.7 - 7.7 K/uL   Lymphocytes Relative 8 %   Lymphs Abs 0.6 (L) 0.7 - 4.0 K/uL   Monocytes Relative 8 %   Monocytes Absolute 0.6 0.1 - 1.0 K/uL   Eosinophils Relative 0 %   Eosinophils Absolute 0.0 0.0 - 0.7 K/uL   Basophils Relative 0 %   Basophils Absolute 0.0 0.0 - 0.1 K/uL    Comment: Performed at [facility] [address] [place], [place] [address]  I-stat troponin, ED     Status: None   Collection Time: 04/29/17  9:23 PM  Result Value Ref Range   Troponin i, poc 0.01 0.00 - 0.08 ng/mL   Comment 3            Comment: Due to the release kinetics of cTnI, a negative result within the first hours of the  onset of symptoms does not rule out myocardial infarction with certainty. If myocardial infarction is still suspected, repeat the test at appropriate intervals.   I-Stat CG4 Lactic Acid, ED     Status: None   Collection Time: 04/29/17  9:24 PM  Result Value Ref Range   Lactic Acid, Venous 1.12 0.5 - 1.9 mmol/L  Lipase, blood     Status: None   Collection Time: 05/02/17  1:42 PM  Result Value Ref Range   Lipase 26 11 - 51 U/L    Comment: Performed at [facility], [place] [address] [place], [place] [address]  Comprehensive metabolic panel     Status: Abnormal   Collection Time: 05/02/17  1:42 PM  Result Value Ref Range   Sodium 138 135 - 145 mmol/L   Potassium 4.2 3.5 - 5.1 mmol/L   Chloride 105 101 - 111 mmol/L   CO2 23 22 - 32 mmol/L   Glucose, Bld 110 (H) 65 - 99 mg/dL   BUN 10 6 - 20 mg/dL   Creatinine, Ser 1.22 0.61 - 1.24 mg/dL   Calcium 9.3 8.9 - 10.3  mg/dL   Total Protein 7.0 6.5 - 8.1 g/dL   Albumin 3.0 (L) 3.5 - 5.0 g/dL   AST 21 15 - 41 U/L   ALT 16 (L) 17 - 63 U/L   Alkaline Phosphatase 68 38 - 126 U/L   Total Bilirubin 0.7 0.3 - 1.2 mg/dL   GFR calc non Af Amer 59 (L) >60 mL/min   GFR calc Af Amer >60 >60 mL/min    Comment: (NOTE) The eGFR has been calculated using the CKD EPI equation. This calculation has not been validated in all clinical situations. eGFR's persistently <60 mL/min signify possible Chronic Kidney Disease.    Anion gap 10 5 - 15    Comment: Performed at [facility] [address], [place], [place] [address]  CBC with Differential     Status: None   Collection Time: 05/02/17  1:42 PM  Result Value Ref Range   WBC 6.1 4.0 - 10.5 K/uL   RBC 4.69 4.22 - 5.81 MIL/uL   Hemoglobin 14.1 13.0 - 17.0 g/dL   HCT 40.9 39.0 - 52.0 %   MCV 87.2 78.0 - 100.0 fL   MCH 30.1 26.0 - 34.0 pg   MCHC 34.5 30.0 - 36.0 g/dL   RDW 12.9 11.5 - 15.5 %   Platelets 350 150 - 400 K/uL   Neutrophils Relative % 71 %   Lymphocytes Relative 21 %    Monocytes Relative 7 %   Eosinophils Relative 1 %   Basophils Relative 0 %   Neutro Abs 4.3 1.7 - 7.7 K/uL   Lymphs Abs 1.3 0.7 - 4.0 K/uL   Monocytes Absolute 0.4 0.1 - 1.0 K/uL   Eosinophils Absolute 0.1 0.0 - 0.7 K/uL   Basophils Absolute 0.0 0.0 - 0.1 K/uL   WBC Morphology ATYPICAL LYMPHOCYTES     Comment: Performed at [facility], [place] [address], [place], [place] [address]  Urinalysis, Routine w reflex microscopic     Status: None   Collection Time: 05/02/17  1:43 PM  Result Value Ref Range   Color, Urine YELLOW YELLOW   APPearance CLEAR CLEAR   Specific Gravity, Urine 1.016 1.005 - 1.030   pH 5.0 5.0 - 8.0   Glucose, UA NEGATIVE NEGATIVE mg/dL   Hgb urine dipstick NEGATIVE NEGATIVE   Bilirubin Urine NEGATIVE NEGATIVE   Ketones, ur NEGATIVE NEGATIVE mg/dL   Protein, ur NEGATIVE NEGATIVE mg/dL   Nitrite NEGATIVE NEGATIVE   Leukocytes, UA NEGATIVE NEGATIVE    Comment: Performed at [facility] [address], [place], [place] [address]  I-Stat CG4 Lactic Acid, ED     Status: None   Collection Time: 05/02/17  2:01 PM  Result Value Ref Range   Lactic Acid, Venous 1.17 0.5 - 1.9 mmol/L  Vitamin D (25 hydroxy)     Status: None   Collection Time: 05/11/17 11:41 AM  Result Value Ref Range   VITD 40.06 30.00 - 100.00 ng/mL    Assessment/Plan: 1. Acute bilateral low back pain without sciatica Atraumatic. No radiation of pain. No bony tenderness on examination to warrant imaging. Will have him restart his Tramadol. Prednisone taper given along with Rx Flexeril.Supportive measures and exercises discussed. Follow-up if not improving.    Leeanne Rio, PA-C

## 2017-07-13 NOTE — Patient Instructions (Addendum)
Please avoid heavy lifting or overexertion. Take the Tramadol as directed. Tylenol for breakthrough pain. Take the steroid pack as directed as well. Take this with food.  Continue the compression wrap.  As pain resolves, start the stretches below.   Low Back Sprain Rehab Ask your health care provider which exercises are safe for you. Do exercises exactly as told by your health care provider and adjust them as directed. It is normal to feel mild stretching, pulling, tightness, or discomfort as you do these exercises, but you should stop right away if you feel sudden pain or your pain gets worse. Do not begin these exercises until told by your health care provider. Stretching and range of motion exercises These exercises warm up your muscles and joints and improve the movement and flexibility of your back. These exercises also help to relieve pain, numbness, and tingling. Exercise A: Lumbar rotation  1. Lie on your back on a firm surface and bend your knees. 2. Straighten your arms out to your sides so each arm forms an "L" shape with a side of your body (a 90 degree angle). 3. Slowly move both of your knees to one side of your body until you feel a stretch in your lower back. Try not to let your shoulders move off of the floor. 4. Hold for __________ seconds. 5. Tense your abdominal muscles and slowly move your knees back to the starting position. 6. Repeat this exercise on the other side of your body. Repeat __________ times. Complete this exercise __________ times a day. Exercise B: Prone extension on elbows  1. Lie on your abdomen on a firm surface. 2. Prop yourself up on your elbows. 3. Use your arms to help lift your chest up until you feel a gentle stretch in your abdomen and your lower back. ? This will place some of your body weight on your elbows. If this is uncomfortable, try stacking pillows under your chest. ? Your hips should stay down, against the surface that you are lying  on. Keep your hip and back muscles relaxed. 4. Hold for __________ seconds. 5. Slowly relax your upper body and return to the starting position. Repeat __________ times. Complete this exercise __________ times a day. Strengthening exercises These exercises build strength and endurance in your back. Endurance is the ability to use your muscles for a long time, even after they get tired. Exercise C: Pelvic tilt 1. Lie on your back on a firm surface. Bend your knees and keep your feet flat. 2. Tense your abdominal muscles. Tip your pelvis up toward the ceiling and flatten your lower back into the floor. ? To help with this exercise, you may place a small towel under your lower back and try to push your back into the towel. 3. Hold for __________ seconds. 4. Let your muscles relax completely before you repeat this exercise. Repeat __________ times. Complete this exercise __________ times a day. Exercise D: Alternating arm and leg raises  1. Get on your hands and knees on a firm surface. If you are on a hard floor, you may want to use padding to cushion your knees, such as an exercise mat. 2. Line up your arms and legs. Your hands should be below your shoulders, and your knees should be below your hips. 3. Lift your left leg behind you. At the same time, raise your right arm and straighten it in front of you. ? Do not lift your leg higher than your hip. ? Do  not lift your arm higher than your shoulder. ? Keep your abdominal and back muscles tight. ? Keep your hips facing the ground. ? Do not arch your back. ? Keep your balance carefully, and do not hold your breath. 4. Hold for __________ seconds. 5. Slowly return to the starting position and repeat with your right leg and your left arm. Repeat __________ times. Complete this exercise __________ times a day. Exercise E: Abdominal set with straight leg raise  1. Lie on your back on a firm surface. 2. Bend one of your knees and keep your other  leg straight. 3. Tense your abdominal muscles and lift your straight leg up, 4-6 inches (10-15 cm) off the ground. 4. Keep your abdominal muscles tight and hold for __________ seconds. ? Do not hold your breath. ? Do not arch your back. Keep it flat against the ground. 5. Keep your abdominal muscles tense as you slowly lower your leg back to the starting position. 6. Repeat with your other leg. Repeat __________ times. Complete this exercise __________ times a day. Posture and body mechanics  Body mechanics refers to the movements and positions of your body while you do your daily activities. Posture is part of body mechanics. Good posture and healthy body mechanics can help to relieve stress in your body's tissues and joints. Good posture means that your spine is in its natural S-curve position (your spine is neutral), your shoulders are pulled back slightly, and your head is not tipped forward. The following are general guidelines for applying improved posture and body mechanics to your everyday activities. Standing   When standing, keep your spine neutral and your feet about hip-width apart. Keep a slight bend in your knees. Your ears, shoulders, and hips should line up.  When you do a task in which you stand in one place for a long time, place one foot up on a stable object that is 2-4 inches (5-10 cm) high, such as a footstool. This helps keep your spine neutral. Sitting   When sitting, keep your spine neutral and keep your feet flat on the floor. Use a footrest, if necessary, and keep your thighs parallel to the floor. Avoid rounding your shoulders, and avoid tilting your head forward.  When working at a desk or a computer, keep your desk at a height where your hands are slightly lower than your elbows. Slide your chair under your desk so you are close enough to maintain good posture.  When working at a computer, place your monitor at a height where you are looking straight ahead and  you do not have to tilt your head forward or downward to look at the screen. Resting   When lying down and resting, avoid positions that are most painful for you.  If you have pain with activities such as sitting, bending, stooping, or squatting (flexion-based activities), lie in a position in which your body does not bend very much. For example, avoid curling up on your side with your arms and knees near your chest (fetal position).  If you have pain with activities such as standing for a long time or reaching with your arms (extension-based activities), lie with your spine in a neutral position and bend your knees slightly. Try the following positions:  Lying on your side with a pillow between your knees.  Lying on your back with a pillow under your knees. Lifting   When lifting objects, keep your feet at least shoulder-width apart and tighten your abdominal  muscles.  Bend your knees and hips and keep your spine neutral. It is important to lift using the strength of your legs, not your back. Do not lock your knees straight out.  Always ask for help to lift heavy or awkward objects. This information is not intended to replace advice given to you by your health care provider. Make sure you discuss any questions you have with your health care provider. Document Released: 12/23/2004 Document Revised: 08/30/2015 Document Reviewed: 10/04/2014 Elsevier Interactive Patient Education  [employer].

## 2017-07-13 NOTE — Patient Instructions (Addendum)
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  Vilas Edgerly is a hearing aid candidate and one is strongly recommended.Maziah Keeling has excellent word recognition in quiet. However, in minimal background noise his word recognition drops to poor in each ear.    Recommendations:  1) To obtain hearing aids:  a)  [facility] since Kalon Erhardt was in the [employer] for 4 years during [place] and 1 year in the [employer].  b) Another hearing option may be through [facility] in [place] [phone].  C) Check with insurance company to determine hearing aid benefits.   2) Strategies that help improve hearing include:  A) Face the speaker directly. Optimal is having the speakers face well - lit.  Unless amplified, being within 3-6 feet of the speaker will enhance word recognition.  B) Avoid having the speaker back-lit as this will minimize the ability to use cues from lip-reading, facial expression and gestures.  C)  Word recognition is poorer in background noise. For optimal word recognition, turn off the TV, radio or noisy fan when engaging in conversation. In a restaurant, try to sit away from noise sources and close to the primary speaker.   D)  Ask for topic clarification from time to time in order to remain in the conversation.  Most people don't mind repeating or clarifying a point when asked.  If needed, explain the difficulty hearing in background noise or hearing loss.  3)  Consider phone apps to help hearing until hearing aid obtained such as "Mobile [employer]".  4)  Closely monitor hearing with a repeat hearing evaluation in 6 months - earlier if there are changes in hearing.   Fran Mcree L. Heide Spark, Au.D., CCC-A Doctor of Audiology 07/13/2017

## 2017-07-13 NOTE — Procedures (Signed)
[facility]  [place], [place] [address]  [phone]   Audiological Evaluation  Patient Name: Gary [name]   Status: Outpatient   DOB: [birth year]    Diagnosis: Bilateral Hearing Loss MRN: [id number] Date:  07/13/2017     Referent: Brunetta Jeans, PA-C  History: Gary [name] was seen for an audiological evaluation.  Primary Concern: Hearing has been getting worse, needs to look at people to hear them or "can't make out what they are saying".  Especially has difficulty hearing his daughter. Possibly significant - has a "history of four kidney stones" and is at "high risk for kidney issues".  Pain: None History of hearing problems: Y / N History of ear infections:  Y / N History of ear surgery or "tubes" : N History of dizziness/vertigo:   Y  In the past, has had a "several time".  The last time was when he had "pneumonia" a few months ago.  History of balance issues:  N Tinnitus: N Sound sensitivity: "Loud sounds make me jump". History of occupational noise exposure: In [place] in 1969 in [place]. Agent Orange Exposure. History of hypertension: Y - Controlled with medication. History of diabetes:  N Family history of hearing loss:  N   Evaluation: Conventional pure tone audiometry from 250Hz  - 8000Hz  with using insert earphones.  Hearing Thresholds are symmetrical ranging from 30-35 dBHL at 500Hz ; 30 dBHL from 750-1000Hz ; 40 dBHL at 2000Hz ; 55-65 dBHL at 3000Hz ; 65 dBHL at 4000Hz  and 60 dBHL from 6000Hz  - 8000Hz  bilaterally. The hearing loss is sensorineural bilaterally. Please note that at 250Hz  hearing thresholds are 30 dBHL on the left and 45 dBHL on the right. Reliability is good Speech reception levels (repeating words near threshold) using recorded spondee word lists:  Right ear: 35/40 dBHL.  Left ear:  35/40 dBHL Word recognition (at most comfortably loud volumes) using recorded NU-6 word lists at 45 dBHL, in  quiet.  Right ear: 100% at 75 dBHL.  Left ear:   100% at 70 dBHL Word recognition in minimal background noise:  +5 dBHL  Right ear: 68%                              Left ear:  56%  Tympanometry shows normal middle ear volume, pressure and compliance bilaterally (Type A) ipsilateral screening acoustic reflexes at 1000Hz  are absent bilaterally. A seal was difficulty to maintain for additional testing.   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  Gary [name] has excellent word recognition in quiet. However, in minimal background noise his word recognition drops to poor in each ear.Gary [name] is a hearing aid candidate and one is strongly recommended.   Amplification helps make the signal louder and therefore often improves hearing and word recognition.  Amplification has many forms including hearing aids in one or both ears, an assistive listening device which have a microphone and speaker such as a small handheld device and/or even a surround sound system of speakers.  Amplification may be covered by some insurances, but not all.  It is important to note that hearing aids must be individually fit according to the hearing test results and the ear shape.  Audiologists and hearing aid dealers in [place] must be licensed in order to dispense hearing aids.  In addition, a trial period is  mandated by law in our state because often amplification must be tried and then evaluated in order to determine benefit.   The test results were discussed and Gary [name] counseled.  Recommendations: 1) To obtain hearing aids:  a)  [facility] since Gary [name] was in the [employer] for 4 years during [place] and 1 year in the [employer].  b) Another hearing option may be through [facility] in [place] [phone].  C)  Check with insurance company to determine hearing aid benefits.   2) Strategies that help improve hearing include:  A) Face the speaker directly. Optimal is having the speakers face well - lit.  Unless amplified, being within 3-6 feet of the speaker will enhance word recognition.  B) Avoid having the speaker back-lit as this will minimize the ability to use cues from lip-reading, facial expression and gestures.  C)  Word recognition is poorer in background noise. For optimal word recognition, turn off the TV, radio or noisy fan when engaging in conversation. In a restaurant, try to sit away from noise sources and close to the primary speaker.   D)  Ask for topic clarification from time to time in order to remain in the conversation.  Most people don't mind repeating or clarifying a point when asked.  If needed, explain the difficulty hearing in background noise or hearing loss.  3)  Consider phone apps to help hearing until hearing aid obtained such as "Mobile Ears".  4)  Closely monitor hearing with a repeat hearing evaluation in 6 months - earlier if there are changes in hearing.   5) [employer], amplified telephone with captions certification was submitted. Gary [name] will be contacted by the company for a home phone.  Glenetta Kiger L. Heide Spark, Au.D., CCC-A Doctor of Audiology 07/13/2017  cc: Brunetta Jeans, PA-C

## 2017-07-18 ENCOUNTER — Encounter: Payer: Self-pay | Admitting: Physician Assistant

## 2017-07-20 ENCOUNTER — Encounter: Payer: Self-pay | Admitting: Physician Assistant

## 2017-07-21 ENCOUNTER — Encounter: Payer: Self-pay | Admitting: Physician Assistant

## 2017-08-13 ENCOUNTER — Ambulatory Visit: Payer: 59 | Admitting: Pulmonary Disease

## 2017-08-13 ENCOUNTER — Encounter: Payer: Self-pay | Admitting: Pulmonary Disease

## 2017-08-13 VITALS — BP 116/64 | HR 82 | Ht 70.0 in | Wt 168.0 lb

## 2017-08-13 DIAGNOSIS — G4752 REM sleep behavior disorder: Secondary | ICD-10-CM

## 2017-08-13 DIAGNOSIS — G478 Other sleep disorders: Secondary | ICD-10-CM | POA: Diagnosis not present
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
## 2017-08-13 NOTE — Patient Instructions (Signed)
REM Behavior disorder  Nonrestorative sleep  Shiftwork disorder   Trial with Melatonin starting at 1 mg 30 minutes to an hour before sleep, may escalate to 3 mg and then 5 mg as tolerated Watch for grogginess and sleepiness in the morning  If melatonin does not help we will try Klonopin  Possibility of obtaining a monitored sleep study discussed--the downside is that you may not have these events while in the lab  Provide a safe environment for sleep  I will see you back in the office in 6 weeks

## 2017-08-15 ENCOUNTER — Emergency Department (HOSPITAL_COMMUNITY): Payer: No Typology Code available for payment source

## 2017-08-15 ENCOUNTER — Emergency Department (HOSPITAL_COMMUNITY)
Admission: EM | Admit: 2017-08-15 | Discharge: 2017-08-15 | Disposition: A | Discharge status: Home / Self Care | Payer: No Typology Code available for payment source | Attending: Emergency Medicine | Admitting: Emergency Medicine

## 2017-08-15 ENCOUNTER — Other Ambulatory Visit: Payer: Self-pay

## 2017-08-15 ENCOUNTER — Encounter (HOSPITAL_COMMUNITY): Payer: Self-pay | Admitting: Emergency Medicine

## 2017-08-15 DIAGNOSIS — Z79899 Other long term (current) drug therapy: Secondary | ICD-10-CM | POA: Insufficient documentation

## 2017-08-15 DIAGNOSIS — S0083XA Contusion of other part of head, initial encounter: Secondary | ICD-10-CM | POA: Insufficient documentation

## 2017-08-15 DIAGNOSIS — I10 Essential (primary) hypertension: Secondary | ICD-10-CM | POA: Insufficient documentation

## 2017-08-15 DIAGNOSIS — Z7982 Long term (current) use of aspirin: Secondary | ICD-10-CM | POA: Diagnosis not present

## 2017-08-15 DIAGNOSIS — W01198A Fall on same level from slipping, tripping and stumbling with subsequent striking against other object, initial encounter: Secondary | ICD-10-CM | POA: Insufficient documentation

## 2017-08-15 DIAGNOSIS — Z87891 Personal history of nicotine dependence: Secondary | ICD-10-CM | POA: Insufficient documentation

## 2017-08-15 DIAGNOSIS — Y9289 Other specified places as the place of occurrence of the external cause: Secondary | ICD-10-CM | POA: Diagnosis not present

## 2017-08-15 DIAGNOSIS — S0990XA Unspecified injury of head, initial encounter: Secondary | ICD-10-CM

## 2017-08-15 DIAGNOSIS — Y990 Civilian activity done for income or pay: Secondary | ICD-10-CM | POA: Insufficient documentation

## 2017-08-15 DIAGNOSIS — Y9389 Activity, other specified: Secondary | ICD-10-CM | POA: Diagnosis not present

## 2017-08-15 DIAGNOSIS — S5012XA Contusion of left forearm, initial encounter: Secondary | ICD-10-CM | POA: Diagnosis not present

## 2017-08-15 MED ORDER — ACETAMINOPHEN 325 MG PO TABS
650.0000 mg | ORAL_TABLET | Freq: Once | ORAL | Status: AC
  Administered 2017-08-15: 650 mg via ORAL
  Filled 2017-08-15: qty 2

## 2017-08-15 NOTE — ED Notes (Signed)
Pt states that he slipped on pt's grits and landed on buttocks on concrete floor; not c/o buttock or back pain at this time

## 2017-08-15 NOTE — ED Notes (Signed)
Pt back from imaging

## 2017-08-15 NOTE — ED Notes (Signed)
Patient transported to X-ray 

## 2017-08-15 NOTE — Discharge Instructions (Addendum)
Ice and elevate your forearm several times a day. Ice your face. Ibuprofen or tylenol for pain. Follow up with your doctor as needed. Return if worsening symptoms.

## 2017-08-15 NOTE — ED Notes (Signed)
Patient transported to CT 

## 2017-08-15 NOTE — ED Notes (Signed)
Pt discharged from ED; instructions provided; Pt encouraged to return to ED if symptoms worsen and to f/u with PCP; Pt verbalized understanding of all instructions 
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
## 2017-08-18 ENCOUNTER — Encounter: Payer: Self-pay | Admitting: Physician Assistant

## 2017-08-18 MED ORDER — TRAMADOL HCL 50 MG PO TABS: 50.0000 mg | ORAL_TABLET | Freq: Every day | ORAL | 0 refills | Status: DC | PRN

## 2017-08-24 ENCOUNTER — Encounter: Payer: Self-pay | Admitting: Physician Assistant

## 2017-08-24 DIAGNOSIS — I10 Essential (primary) hypertension: Secondary | ICD-10-CM

## 2017-08-24 DIAGNOSIS — K219 Gastro-esophageal reflux disease without esophagitis: Secondary | ICD-10-CM

## 2017-08-24 DIAGNOSIS — J181 Lobar pneumonia, unspecified organism: Principal | ICD-10-CM

## 2017-08-24 DIAGNOSIS — J189 Pneumonia, unspecified organism: Secondary | ICD-10-CM

## 2017-08-24 DIAGNOSIS — N401 Enlarged prostate with lower urinary tract symptoms: Secondary | ICD-10-CM

## 2017-08-24 MED ORDER — PANTOPRAZOLE SODIUM 40 MG PO TBEC
40.0000 mg | DELAYED_RELEASE_TABLET | Freq: Every day | ORAL | 1 refills | Status: DC
Start: 2017-08-24 — End: 2018-05-24

## 2017-08-24 MED ORDER — LISINOPRIL 20 MG PO TABS: 20.0000 mg | ORAL_TABLET | Freq: Every day | ORAL | 1 refills | Status: DC

## 2017-08-24 MED ORDER — TAMSULOSIN HCL 0.4 MG PO CAPS: 0.4000 mg | ORAL_CAPSULE | Freq: Every day | ORAL | 1 refills | Status: DC

## 2017-08-24 MED ORDER — FLUTICASONE PROPIONATE 50 MCG/ACT NA SUSP: 2.0000 | Freq: Every day | NASAL | 6 refills | Status: DC

## 2017-08-24 MED ORDER — ATORVASTATIN CALCIUM 40 MG PO TABS: 40.0000 mg | ORAL_TABLET | Freq: Every day | ORAL | 1 refills | Status: DC

## 2017-08-24 NOTE — Telephone Encounter (Signed)
Refilled Pantoprazole, Atorvastatin, Lisinopril, Flonase and Tamsulosin,  Tramadol last filled on 08/18/17 Adderall XR 10mg  filled on 05/04/17 Adderall 10 mg filled on 05/04/17

## 2017-08-25 MED ORDER — AMPHETAMINE-DEXTROAMPHET ER 10 MG PO CP24
ORAL_CAPSULE | ORAL | 0 refills | Status: DC
Start: 2017-08-25 — End: 2017-12-11

## 2017-08-25 MED ORDER — AMPHETAMINE-DEXTROAMPHETAMINE 10 MG PO TABS: 10.0000 mg | ORAL_TABLET | Freq: Every day | ORAL | 0 refills | Status: DC

## 2017-08-27 ENCOUNTER — Telehealth: Payer: Self-pay | Admitting: Physician Assistant

## 2017-08-27 NOTE — Telephone Encounter (Signed)
Copied from [place] [id number]. Topic: Quick Communication - See Telephone Encounter >> Aug 27, 2017 10:18 AM Synthia Innocent wrote: CRM for notification. See Telephone encounter for: 08/27/17. [facility] calling needs clarification on dosage of amphetamine-dextroamphetamine (ADDERALL XR) 10 MG 24 hr capsule, states to take on work days, need to know how many days a week. Please advise. Ref # [id number]

## 2017-08-27 NOTE — Telephone Encounter (Signed)
Spoke with pharmacist at [facility] gave clarification for Adderall medication. Paperwork faxed for clarification.

## 2017-08-27 NOTE — Telephone Encounter (Signed)
Last OV 07/13/17, No future OV  Last filled on 08/25/17, # 90 with 0 refills  There are 2 prescriptions for the same medication but they have different instructions and sent on the same day/same time.

## 2017-09-08 ENCOUNTER — Ambulatory Visit: Payer: 59 | Admitting: Physician Assistant

## 2017-09-08 ENCOUNTER — Encounter: Payer: Self-pay | Admitting: Physician Assistant

## 2017-09-08 ENCOUNTER — Other Ambulatory Visit: Payer: Self-pay

## 2017-09-08 VITALS — BP 142/72 | HR 66 | Temp 97.7°F | Resp 16 | Ht 71.0 in | Wt 170.0 lb

## 2017-09-08 DIAGNOSIS — T63441A Toxic effect of venom of bees, accidental (unintentional), initial encounter: Secondary | ICD-10-CM | POA: Diagnosis not present

## 2017-09-08 MED ORDER — METHYLPREDNISOLONE ACETATE 80 MG/ML IJ SUSP
80.0000 mg | Freq: Once | INTRAMUSCULAR | Status: AC
  Administered 2017-09-08: 80 mg via INTRAMUSCULAR

## 2017-09-08 MED ORDER — HYDROXYZINE HCL 10 MG PO TABS: 10.0000 mg | ORAL_TABLET | Freq: Three times a day (TID) | ORAL | 0 refills | Status: DC | PRN

## 2017-09-08 NOTE — Progress Notes (Signed)
Patient presents to clinic today c/o ongoing pruritus and localized swelling status post 12 yellowjacket stings, occurring 2 days ago.  Was working in the yard when he accidentally aggravated a yellow jacket nest.  Patient denies fever, chills, shortness of breath or tongue swelling.  Denies rash but notes localized itching.  Has been taking Benadryl which helps some with it but is making him very drowsy.  He is unable to take this medication and still work.  Past Medical History:  Diagnosis Date  . Allergy   . GERD (gastroesophageal reflux disease)   . History of chickenpox   . History of diverticulitis 2007  . History of kidney stones   . Hypertension     Current Outpatient Medications on File Prior to Visit  Medication Sig Dispense Refill  . acetaminophen (TYLENOL) 500 MG tablet Take 500 mg by mouth every 6 (six) hours as needed for fever or headache (pain).     Marland Kitchen amphetamine-dextroamphetamine (ADDERALL XR) 10 MG 24 hr capsule Take one tablet (10 mg) by mouth daily on work days 90 capsule 0  . amphetamine-dextroamphetamine (ADDERALL) 10 MG tablet Take 1 tablet (10 mg total) by mouth daily with breakfast. 90 tablet 0  . aspirin EC 81 MG tablet Take 81 mg by mouth at bedtime.     Marland Kitchen atorvastatin (LIPITOR) 40 MG tablet Take 1 tablet (40 mg total) by mouth at bedtime. 90 tablet 1  . EPINEPHrine (EPIPEN 2-PAK) 0.3 mg/0.3 mL IJ SOAJ injection Inject 0.3 mg into the muscle once as needed (severe allergic reaction).    . fluticasone (FLONASE) 50 MCG/ACT nasal spray Place 2 sprays into both nostrils daily. 16 g 6  . lisinopril (PRINIVIL,ZESTRIL) 20 MG tablet Take 1 tablet (20 mg total) by mouth at bedtime. 90 tablet 1  . Multiple Vitamin (MULTIVITAMIN WITH MINERALS) TABS tablet Take 1 tablet by mouth daily.    Marland Kitchen OVER THE COUNTER MEDICATION Apply 1 application topically daily as needed (for itchy).    . pantoprazole (PROTONIX) 40 MG tablet Take 1 tablet (40 mg total) by mouth daily. 90 tablet 1   . tamsulosin (FLOMAX) 0.4 MG CAPS capsule Take 1 capsule (0.4 mg total) by mouth daily after supper. 90 capsule 1  . traMADol (ULTRAM) 50 MG tablet Take 1 tablet (50 mg total) by mouth daily as needed. 30 tablet 0  . vitamin B-12 (CYANOCOBALAMIN) 100 MCG tablet Take 1 tablet (100 mcg total) by mouth daily. 30 tablet 0  . vitamin C (ASCORBIC ACID) 500 MG tablet Take 500 mg by mouth daily.    . Vitamin D, Cholecalciferol, 1000 units CAPS Take 1 capsule by mouth daily. (Patient taking differently: Take 1,000 Units by mouth every other day. ) 60 capsule 0   No current facility-administered medications on file prior to visit.     Allergies  Allergen Reactions  . Levaquin [Levofloxacin] Nausea And Vomiting  . Strattera [Atomoxetine Hcl] Other (See Comments)    Per wife it was ineffective but she does not remember a reaction  . Shellfish Allergy Hives    Family History  Problem Relation Age of Onset  . Heart disease Mother   . Hypertension Mother   . Stroke Mother     Social History   Socioeconomic History  . Marital status: Married    Spouse name: Not on file  . Number of children: Not on file  . Years of education: Not on file  . Highest education level: Not on file  Occupational History  . Not on file  Social Needs  . Financial resource strain: Not on file  . Food insecurity:    Worry: Not on file    Inability: Not on file  . Transportation needs:    Medical: Not on file    Non-medical: Not on file  Tobacco Use  . Smoking status: Former Research scientist (life sciences)  . Smokeless tobacco: Never Used  Substance and Sexual Activity  . Alcohol use: No  . Drug use: No  . Sexual activity: Yes  Lifestyle  . Physical activity:    Days per week: Not on file    Minutes per session: Not on file  . Stress: Not on file  Relationships  . Social connections:    Talks on phone: Not on file    Gets together: Not on file    Attends religious service: Not on file    Active member of club or  organization: Not on file    Attends meetings of clubs or organizations: Not on file    Relationship status: Not on file  Other Topics Concern  . Not on file  Social History Narrative  . Not on file   Review of Systems - See HPI.  All other ROS are negative.  BP (!) 142/72   Pulse 66   Temp 97.7 F (36.5 C) (Oral)   Resp 16   Ht 5\' 11"  (1.803 m)   Wt 170 lb (77.1 kg)   SpO2 98%   BMI 23.71 kg/m   Physical Exam  Constitutional: He appears well-developed and well-nourished.  HENT:  Head: Normocephalic and atraumatic.  Cardiovascular: Normal rate, regular rhythm, normal heart sounds and intact distal pulses.  Pulmonary/Chest: Effort normal and breath sounds normal. No stridor. No respiratory distress. He has no wheezes. He has no rales. He exhibits no tenderness.  Skin:  Multiple stings noted with localized swelling and mild erythema. 1 sting to anterior R proximal thigh. @ stings of posterior upper R arm. 2 stings of each forearm noted. 3 stings noted of upper back.   Vitals reviewed.  Assessment/Plan: 1. Bee sting, accidental or unintentional, initial encounter Localized swelling noted at site of bee stings.  These can be found on the forearms, back of upper arms bilaterally, upper back, and right thigh.  No evidence of retained stinger or superimposed cellulitis.  Will start hydroxyzine.  IM Depo-Medrol given today.  Supportive measures reviewed.  Strict return precautions discussed.  - hydrOXYzine (ATARAX/VISTARIL) 10 MG tablet; Take 1 tablet (10 mg total) by mouth 3 (three) times daily as needed.  Dispense: 30 tablet; Refill: 0 - methylPREDNISolone acetate (DEPO-MEDROL) injection 80 mg   Leeanne Rio, [place]

## 2017-09-08 NOTE — Patient Instructions (Signed)
The steroid shot given should help with the itching and swelling. I have sent in a refill of your Epi-Pen to have on file.  Use the hydroxyzine as directed for itch. Hopefully this will be less sedating than the benadryl you have been taking. If you have an issue with this, switch to OTC Claritin.

## 2017-09-23 ENCOUNTER — Ambulatory Visit: Payer: 59 | Admitting: Pulmonary Disease

## 2017-09-23 ENCOUNTER — Encounter: Payer: Self-pay | Admitting: Physician Assistant

## 2017-09-23 MED ORDER — TRAMADOL HCL 50 MG PO TABS: 50.0000 mg | ORAL_TABLET | Freq: Every day | ORAL | 0 refills | Status: DC | PRN

## 2017-09-30 ENCOUNTER — Other Ambulatory Visit: Payer: Self-pay

## 2017-09-30 ENCOUNTER — Ambulatory Visit (INDEPENDENT_AMBULATORY_CARE_PROVIDER_SITE_OTHER): Payer: 59 | Admitting: Physician Assistant

## 2017-09-30 ENCOUNTER — Encounter: Payer: Self-pay | Admitting: Physician Assistant

## 2017-09-30 VITALS — BP 96/48 | HR 103 | Temp 97.6°F | Resp 14 | Ht 71.0 in | Wt 163.0 lb

## 2017-09-30 DIAGNOSIS — R42 Dizziness and giddiness: Secondary | ICD-10-CM | POA: Diagnosis not present

## 2017-09-30 LAB — CBC WITH DIFFERENTIAL/PLATELET
Basophils Absolute: 0 10*3/uL (ref 0.0–0.1)
Basophils Relative: 0.6 % (ref 0.0–3.0)
Eosinophils Absolute: 0 10*3/uL (ref 0.0–0.7)
Eosinophils Relative: 0.9 % (ref 0.0–5.0)
HCT: 42.7 % (ref 39.0–52.0)
Hemoglobin: 14.7 g/dL (ref 13.0–17.0)
Lymphocytes Relative: 13.9 % (ref 12.0–46.0)
Lymphs Abs: 0.7 10*3/uL (ref 0.7–4.0)
MCHC: 34.4 g/dL (ref 30.0–36.0)
MCV: 91.2 fl (ref 78.0–100.0)
Monocytes Absolute: 0.4 10*3/uL (ref 0.1–1.0)
Monocytes Relative: 7.3 % (ref 3.0–12.0)
Neutro Abs: 3.7 10*3/uL (ref 1.4–7.7)
Neutrophils Relative %: 77.3 % — ABNORMAL HIGH (ref 43.0–77.0)
Platelets: 167 10*3/uL (ref 150.0–400.0)
RBC: 4.68 Mil/uL (ref 4.22–5.81)
RDW: 13.4 % (ref 11.5–15.5)
WBC: 4.8 10*3/uL (ref 4.0–10.5)

## 2017-09-30 NOTE — Progress Notes (Signed)
Patient presents to clinic today c/o noting mild headache and some lightheadedness over the past couple of days. Denies fever, cough or other URI symptoms. Denies vision changes, photophobia or phonophobia, nausea or vomiting. Notes he has been working outside for numerous hours the past few days and symptoms are usually developing in the afternoon, evening. Denies recent travel or sick contact. Notes that he drinks coffee and tea but no other fluids throughout the day.    Past Medical History:  Diagnosis Date  . Allergy   . GERD (gastroesophageal reflux disease)   . History of chickenpox   . History of diverticulitis 2007  . History of kidney stones   . Hypertension     Current Outpatient Medications on File Prior to Visit  Medication Sig Dispense Refill  . acetaminophen (TYLENOL) 500 MG tablet Take 500 mg by mouth every 6 (six) hours as needed for fever or headache (pain).     Marland Kitchen amphetamine-dextroamphetamine (ADDERALL XR) 10 MG 24 hr capsule Take one tablet (10 mg) by mouth daily on work days 90 capsule 0  . amphetamine-dextroamphetamine (ADDERALL) 10 MG tablet Take 1 tablet (10 mg total) by mouth daily with breakfast. 90 tablet 0  . aspirin EC 81 MG tablet Take 81 mg by mouth at bedtime.     Marland Kitchen atorvastatin (LIPITOR) 40 MG tablet Take 1 tablet (40 mg total) by mouth at bedtime. 90 tablet 1  . EPINEPHrine (EPIPEN 2-PAK) 0.3 mg/0.3 mL IJ SOAJ injection Inject 0.3 mg into the muscle once as needed (severe allergic reaction).    . fluticasone (FLONASE) 50 MCG/ACT nasal spray Place 2 sprays into both nostrils daily. 16 g 6  . hydrOXYzine (ATARAX/VISTARIL) 10 MG tablet Take 1 tablet (10 mg total) by mouth 3 (three) times daily as needed. 30 tablet 0  . lisinopril (PRINIVIL,ZESTRIL) 20 MG tablet Take 1 tablet (20 mg total) by mouth at bedtime. 90 tablet 1  . Multiple Vitamin (MULTIVITAMIN WITH MINERALS) TABS tablet Take 1 tablet by mouth daily.    Marland Kitchen OVER THE COUNTER MEDICATION Apply 1  application topically daily as needed (for itchy).    . pantoprazole (PROTONIX) 40 MG tablet Take 1 tablet (40 mg total) by mouth daily. 90 tablet 1  . tamsulosin (FLOMAX) 0.4 MG CAPS capsule Take 1 capsule (0.4 mg total) by mouth daily after supper. 90 capsule 1  . traMADol (ULTRAM) 50 MG tablet Take 1 tablet (50 mg total) by mouth daily as needed. 30 tablet 0  . vitamin B-12 (CYANOCOBALAMIN) 100 MCG tablet Take 1 tablet (100 mcg total) by mouth daily. 30 tablet 0  . vitamin C (ASCORBIC ACID) 500 MG tablet Take 500 mg by mouth daily.    . Vitamin D, Cholecalciferol, 1000 units CAPS Take 1 capsule by mouth daily. (Patient taking differently: Take 1,000 Units by mouth every other day. ) 60 capsule 0   No current facility-administered medications on file prior to visit.     Allergies  Allergen Reactions  . Levaquin [Levofloxacin] Nausea And Vomiting  . Strattera [Atomoxetine Hcl] Other (See Comments)    Per wife it was ineffective but she does not remember a reaction  . Shellfish Allergy Hives    Family History  Problem Relation Age of Onset  . Heart disease Mother   . Hypertension Mother   . Stroke Mother     Social History   Socioeconomic History  . Marital status: Married    Spouse name: Not on file  . Number  of children: Not on file  . Years of education: Not on file  . Highest education level: Not on file  Occupational History  . Not on file  Social Needs  . Financial resource strain: Not on file  . Food insecurity:    Worry: Not on file    Inability: Not on file  . Transportation needs:    Medical: Not on file    Non-medical: Not on file  Tobacco Use  . Smoking status: Former Research scientist (life sciences)  . Smokeless tobacco: Never Used  Substance and Sexual Activity  . Alcohol use: No  . Drug use: No  . Sexual activity: Yes  Lifestyle  . Physical activity:    Days per week: Not on file    Minutes per session: Not on file  . Stress: Not on file  Relationships  . Social  connections:    Talks on phone: Not on file    Gets together: Not on file    Attends religious service: Not on file    Active member of club or organization: Not on file    Attends meetings of clubs or organizations: Not on file    Relationship status: Not on file  Other Topics Concern  . Not on file  Social History Narrative  . Not on file   Review of Systems - See HPI.  All other ROS are negative.  BP (!) 96/48   Pulse (!) 103   Temp 97.6 F (36.4 C) (Oral)   Resp 14   Ht 5\' 11"  (1.803 m)   Wt 163 lb (73.9 kg)   SpO2 97%   BMI 22.73 kg/m   Physical Exam  Constitutional: He is oriented to person, place, and time. He appears well-developed and well-nourished.  HENT:  Head: Normocephalic and atraumatic.  Mouth/Throat: Oropharynx is clear and moist.  Eyes: Pupils are equal, round, and reactive to light. EOM are normal.  Neck: Neck supple.  Cardiovascular: Normal rate, regular rhythm and normal heart sounds.  Pulmonary/Chest: Effort normal and breath sounds normal.  Neurological: He is alert and oriented to person, place, and time.  Psychiatric: He has a normal mood and affect.  Vitals reviewed.  Assessment/Plan: 1. Episodic lightheadedness With headache and fatigue. Secondary to dehydration based on history and exam findings. Will check CBC today. Patient to cut back on caffeine, increase water intake. Recommended G2 Gatorade as well. Will monitor closely. He is to limit time outdoors in the heat.  - CBC with Differential/Platelet   Leeanne Rio, PA-C

## 2017-09-30 NOTE — Patient Instructions (Signed)
Please go to the lab today for blood work.  I will call you with your results. We will alter treatment regimen(s) if indicated by your results.   Please keep well-hydrated. You are not drinking enough fluids to be outside working! Most of what you are drinking is a diuretic which is dehydrating you. Limit yourself to one cup of coffee per day. Avoid other caffeinated beverages. Try to increase water intake. Since you do not like plain water try adding crystal light or Mio to the water. There are a lot of flavors to choose from. The other option is to drink G2 Gatorade to keep you hydrated.  Follow-up if symptoms are not resolving.

## 2017-10-23 ENCOUNTER — Encounter: Payer: Self-pay | Admitting: Physician Assistant

## 2017-10-23 MED ORDER — TRAMADOL HCL 50 MG PO TABS: 50.0000 mg | ORAL_TABLET | Freq: Every day | ORAL | 0 refills | Status: DC | PRN

## 2017-10-23 NOTE — Telephone Encounter (Signed)
Last OV 09/30/17 Tramadol last filled 09/23/17 #30 with 0

## 2017-10-27 ENCOUNTER — Emergency Department (HOSPITAL_BASED_OUTPATIENT_CLINIC_OR_DEPARTMENT_OTHER): Payer: No Typology Code available for payment source

## 2017-10-27 ENCOUNTER — Emergency Department (HOSPITAL_BASED_OUTPATIENT_CLINIC_OR_DEPARTMENT_OTHER)
Admission: EM | Admit: 2017-10-27 | Discharge: 2017-10-28 | Disposition: A | Discharge status: Home / Self Care | Payer: No Typology Code available for payment source | Attending: Emergency Medicine | Admitting: Emergency Medicine

## 2017-10-27 ENCOUNTER — Other Ambulatory Visit: Payer: Self-pay

## 2017-10-27 ENCOUNTER — Encounter (HOSPITAL_BASED_OUTPATIENT_CLINIC_OR_DEPARTMENT_OTHER): Payer: Self-pay | Admitting: *Deleted

## 2017-10-27 DIAGNOSIS — Z79899 Other long term (current) drug therapy: Secondary | ICD-10-CM | POA: Diagnosis not present

## 2017-10-27 DIAGNOSIS — Y999 Unspecified external cause status: Secondary | ICD-10-CM | POA: Diagnosis not present

## 2017-10-27 DIAGNOSIS — W1839XA Other fall on same level, initial encounter: Secondary | ICD-10-CM | POA: Insufficient documentation

## 2017-10-27 DIAGNOSIS — S01111A Laceration without foreign body of right eyelid and periocular area, initial encounter: Secondary | ICD-10-CM | POA: Insufficient documentation

## 2017-10-27 DIAGNOSIS — Y929 Unspecified place or not applicable: Secondary | ICD-10-CM | POA: Diagnosis not present

## 2017-10-27 DIAGNOSIS — Z23 Encounter for immunization: Secondary | ICD-10-CM | POA: Diagnosis not present

## 2017-10-27 DIAGNOSIS — S20211A Contusion of right front wall of thorax, initial encounter: Secondary | ICD-10-CM | POA: Diagnosis not present

## 2017-10-27 DIAGNOSIS — Y9389 Activity, other specified: Secondary | ICD-10-CM | POA: Insufficient documentation

## 2017-10-27 DIAGNOSIS — Z7982 Long term (current) use of aspirin: Secondary | ICD-10-CM | POA: Diagnosis not present

## 2017-10-27 DIAGNOSIS — S0181XA Laceration without foreign body of other part of head, initial encounter: Secondary | ICD-10-CM

## 2017-10-27 DIAGNOSIS — I10 Essential (primary) hypertension: Secondary | ICD-10-CM | POA: Insufficient documentation

## 2017-10-27 DIAGNOSIS — S0990XA Unspecified injury of head, initial encounter: Secondary | ICD-10-CM | POA: Diagnosis present

## 2017-10-27 DIAGNOSIS — S298XXA Other specified injuries of thorax, initial encounter: Secondary | ICD-10-CM
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
## 2017-10-28 MED ORDER — TETANUS-DIPHTH-ACELL PERTUSSIS 5-2.5-18.5 LF-MCG/0.5 IM SUSP
0.5000 mL | Freq: Once | INTRAMUSCULAR | Status: AC
  Administered 2017-10-28: 0.5 mL via INTRAMUSCULAR
  Filled 2017-10-28: qty 0.5

## 2017-10-28 MED ORDER — NAPROXEN 500 MG PO TABS: 500.0000 mg | ORAL_TABLET | Freq: Two times a day (BID) | ORAL | 0 refills | Status: DC

## 2017-10-28 MED ORDER — OXYCODONE-ACETAMINOPHEN 5-325 MG PO TABS: 1.0000 | ORAL_TABLET | Freq: Four times a day (QID) | ORAL | 0 refills | Status: DC | PRN

## 2017-10-28 MED ORDER — LIDOCAINE-EPINEPHRINE (PF) 1 %-1:200000 IJ SOLN
INTRAMUSCULAR | Status: AC
  Administered 2017-10-28: 10 mL
  Filled 2017-10-28: qty 10

## 2017-10-28 MED ORDER — KETOROLAC TROMETHAMINE 30 MG/ML IJ SOLN
15.0000 mg | Freq: Once | INTRAMUSCULAR | Status: AC
  Administered 2017-10-28: 15 mg via INTRAMUSCULAR
  Filled 2017-10-28: qty 1

## 2017-10-28 MED ORDER — LIDOCAINE-EPINEPHRINE 2 %-1:100000 IJ SOLN
20.0000 mL | Freq: Once | INTRAMUSCULAR | Status: DC
Start: 2017-10-28 — End: 2017-10-28
  Filled 2017-10-28: qty 20

## 2017-10-28 NOTE — ED Notes (Signed)
Ice Pk was given to pt. Per Dr. Dina Rich.  Pt. Also given Orange Juice per his request.

## 2017-10-28 NOTE — ED Notes (Signed)
ED Provider at bedside. 
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
## 2017-10-28 NOTE — Discharge Instructions (Addendum)
Seen today after a fall.  You have a contusion to the right chest wall and right ribs.  You may have an underlying fracture that was not seen on x-ray.  Make sure that you are taking big deep breaths.  Pain control is very important.  Apply ice as needed.  If you develop increased pain, shortness of breath, any new or worsening symptoms you should be reevaluated.  Your laceration was repaired with absorbable stitches.  These will dissolve on their own.  Keep covered with antibiotic ointment.

## 2017-10-29 ENCOUNTER — Ambulatory Visit (HOSPITAL_COMMUNITY)
Admission: EM | Admit: 2017-10-29 | Discharge: 2017-10-29 | Disposition: A | Discharge status: Other Acute Inpatient Hospital | Payer: 59

## 2017-11-23 ENCOUNTER — Encounter: Payer: Self-pay | Admitting: Physician Assistant

## 2017-11-23 MED ORDER — TRAMADOL HCL 50 MG PO TABS: 50.0000 mg | ORAL_TABLET | Freq: Every day | ORAL | 0 refills | Status: DC | PRN

## 2017-11-23 NOTE — Telephone Encounter (Signed)
Please advise 

## 2017-11-23 NOTE — Addendum Note (Signed)
Addended by: Brunetta Jeans on: 11/23/2017 04:04 PM   Modules accepted: Orders

## 2017-11-27 ENCOUNTER — Other Ambulatory Visit: Payer: Self-pay | Admitting: Occupational Medicine

## 2017-12-11 ENCOUNTER — Encounter: Payer: Self-pay | Admitting: Physician Assistant

## 2017-12-11 MED ORDER — AMPHETAMINE-DEXTROAMPHETAMINE 10 MG PO TABS: 10.0000 mg | ORAL_TABLET | Freq: Every day | ORAL | 0 refills | Status: DC

## 2017-12-11 MED ORDER — AMPHETAMINE-DEXTROAMPHET ER 10 MG PO CP24: ORAL_CAPSULE | ORAL | 0 refills | Status: DC

## 2017-12-11 NOTE — Telephone Encounter (Signed)
Will you continue to fill for patient to mail-order?

## 2017-12-25 ENCOUNTER — Encounter: Payer: Self-pay | Admitting: Physician Assistant

## 2017-12-25 MED ORDER — TRAMADOL HCL 50 MG PO TABS: 50.0000 mg | ORAL_TABLET | Freq: Every day | ORAL | 0 refills | Status: DC | PRN

## 2017-12-28 ENCOUNTER — Ambulatory Visit
Admission: EM | Admit: 2017-12-28 | Discharge: 2017-12-28 | Disposition: A | Discharge status: Home / Self Care | Payer: 59 | Attending: Family Medicine | Admitting: Family Medicine

## 2017-12-28 ENCOUNTER — Ambulatory Visit: Payer: Self-pay | Admitting: *Deleted

## 2017-12-28 ENCOUNTER — Encounter: Payer: Self-pay | Admitting: Emergency Medicine

## 2017-12-28 DIAGNOSIS — R6889 Other general symptoms and signs: Secondary | ICD-10-CM

## 2017-12-28 HISTORY — DX: Disorder of kidney and ureter, unspecified: N28.9

## 2017-12-28 LAB — POCT INFLUENZA A/B
Influenza A, POC: NEGATIVE
Influenza B, POC: NEGATIVE

## 2017-12-28 MED ORDER — IBUPROFEN 800 MG PO TABS: 800.0000 mg | ORAL_TABLET | Freq: Three times a day (TID) | ORAL | 0 refills | Status: DC

## 2017-12-28 MED ORDER — OSELTAMIVIR PHOSPHATE 75 MG PO CAPS: 75.0000 mg | ORAL_CAPSULE | Freq: Two times a day (BID) | ORAL | 0 refills | Status: DC

## 2017-12-28 NOTE — Discharge Instructions (Signed)
Must get plenty of rest Push fluids.  Keep a drink at bedside. Take ibuprofen up to 3 times a day with food This is for body ache and fever.  Should help with chills Take the Tamiflu twice a day as directed Go to ER promptly if worse instead of better

## 2017-12-28 NOTE — ED Triage Notes (Signed)
Pt presents to [facility] for assessment of body aches, developing nasal congestion, hypotension at home, dizziness.  Hx of dehydration, according to family member

## 2017-12-28 NOTE — Telephone Encounter (Signed)
[name] is calling to report her husband is having low BP, elevated heart rate, dizziness and cold spells. She feels he may be dehydrated because these are the symptoms he normally gets.  Advised with the symptoms he is having- ED. Office notified of advisement.   Reason for Disposition . [0] Fall in systolic BP > 20 mm Hg from normal AND [2] dizzy, lightheaded, or weak  Answer Assessment - Initial Assessment Questions 1. BLOOD PRESSURE: "What is the blood pressure?" "Did you take at least two measurements 5 minutes apart?"     86/52  P 104,  92/52  P 109 2. ONSET: "When did you take your blood pressure?"     1:45, 1:55 3. HOW: "How did you obtain the blood pressure?" (e.g., visiting nurse, automatic home BP monitor)     Wrist cuff 4. HISTORY: "Do you have a history of low blood pressure?" "What is your blood pressure normally?"     Dehydration causes patient to have low BP 5. MEDICATIONS: "Are you taking any medications for blood pressure?" If yes: "Have they been changed recently?"     Yes- no changes recnetly 6. PULSE RATE: "Do you know what your pulse rate is?"      104 7. OTHER SYMPTOMS: "Have you been sick recently?" "Have you had a recent injury?"     No, no  8. PREGNANCY: "Is there any chance you are pregnant?" "When was your last menstrual period?"     n/a  Protocols used: LOW BLOOD PRESSURE-A-AH

## 2017-12-28 NOTE — ED Provider Notes (Signed)
[facility]    CSN: [id number] Arrival date & time: 12/28/17  1527     History   Chief Complaint Chief Complaint  Patient presents with  . Hypotension    HPI Gary [name] is a 72 y.o. male.   HPI  Patient is here for "dehydration".  His wife provides much of the history.  He does not talk very much.  She states that she took his blood pressure at home and it was 80/60.  She states that he goes to the hospital for "dehydration" several times a year.  She states he never drinks very much.  Currently has body aches.  Shaking chills.  Uncertain fever.  No nausea no vomiting.  Mild cough.  No chest pain no sputum production no shortness of breath.  No runny nose or sore throat He is on multiple medications.  The wife states that he is stable with them. He has not eaten normally since yesterday.  Past Medical History:  Diagnosis Date  . Allergy   . GERD (gastroesophageal reflux disease)   . History of chickenpox   . History of diverticulitis 2007  . History of kidney stones   . Hypertension   . Renal disorder    kidney stones    Patient Active Problem List   Diagnosis Date Noted  . Hypertension 03/24/2017  . History of nephrolithiasis 03/24/2017  . Hyperlipidemia 03/24/2017  . Diverticulosis 03/24/2017    History reviewed. No pertinent surgical history.     Home Medications    Prior to Admission medications   Medication Sig Start Date End Date Taking? Authorizing Provider  amphetamine-dextroamphetamine (ADDERALL XR) 10 MG 24 hr capsule Take one tablet (10 mg) by mouth daily on work days 12/11/17  Yes Midge Minium, MD  amphetamine-dextroamphetamine (ADDERALL) 10 MG tablet Take 1 tablet (10 mg total) by mouth daily with breakfast. 12/11/17  Yes Midge Minium, MD  aspirin EC 81 MG tablet Take 81 mg by mouth at bedtime.    Yes [provider]  atorvastatin (LIPITOR) 40 MG tablet Take 1 tablet (40 mg total) by mouth at bedtime.  08/24/17  Yes Brunetta Jeans, PA-C  fluticasone (FLONASE) 50 MCG/ACT nasal spray Place 2 sprays into both nostrils daily. 08/24/17  Yes Brunetta Jeans, PA-C  lisinopril (PRINIVIL,ZESTRIL) 20 MG tablet Take 1 tablet (20 mg total) by mouth at bedtime. 08/24/17  Yes Brunetta Jeans, PA-C  pantoprazole (PROTONIX) 40 MG tablet Take 1 tablet (40 mg total) by mouth daily. 08/24/17  Yes Brunetta Jeans, PA-C  tamsulosin (FLOMAX) 0.4 MG CAPS capsule Take 1 capsule (0.4 mg total) by mouth daily after supper. 08/24/17  Yes Brunetta Jeans, PA-C  traMADol (ULTRAM) 50 MG tablet Take 1 tablet (50 mg total) by mouth daily as needed. 12/25/17  Yes Brunetta Jeans, PA-C  vitamin B-12 (CYANOCOBALAMIN) 100 MCG tablet Take 1 tablet (100 mcg total) by mouth daily. 03/18/17  Yes Brunetta Jeans, PA-C  vitamin C (ASCORBIC ACID) 500 MG tablet Take 500 mg by mouth daily.   Yes [provider]  Vitamin D, Cholecalciferol, 1000 units CAPS Take 1 capsule by mouth daily. Patient taking differently: Take 1,000 Units by mouth every other day.  03/18/17  Yes Brunetta Jeans, PA-C  acetaminophen (TYLENOL) 500 MG tablet Take 500 mg by mouth every 6 (six) hours as needed for fever or headache (pain).     [provider]  EPINEPHrine (EPIPEN 2-PAK) 0.3 mg/0.3 mL IJ  SOAJ injection Inject 0.3 mg into the muscle once as needed (severe allergic reaction).    [provider]  hydrOXYzine (ATARAX/VISTARIL) 10 MG tablet Take 1 tablet (10 mg total) by mouth 3 (three) times daily as needed. 09/08/17   Brunetta Jeans, PA-C  ibuprofen (ADVIL,MOTRIN) 800 MG tablet Take 1 tablet (800 mg total) by mouth 3 (three) times daily. 12/28/17   Raylene Everts, MD  Multiple Vitamin (MULTIVITAMIN WITH MINERALS) TABS tablet Take 1 tablet by mouth daily.    [provider]  oseltamivir (TAMIFLU) 75 MG capsule Take 1 capsule (75 mg total) by mouth every 12 (twelve) hours. 12/28/17   Raylene Everts, MD  OVER  THE COUNTER MEDICATION Apply 1 application topically daily as needed (for itchy).    [provider]    Family History Family History  Problem Relation Age of Onset  . Heart disease Mother   . Hypertension Mother   . Stroke Mother     Social History Social History   Tobacco Use  . Smoking status: Former Research scientist (life sciences)  . Smokeless tobacco: Never Used  Substance Use Topics  . Alcohol use: No  . Drug use: No     Allergies   Levaquin [levofloxacin]; Strattera [atomoxetine hcl]; and Shellfish allergy   Review of Systems Review of Systems  Constitutional: Positive for activity change, appetite change, chills, fatigue and fever.  HENT: Negative for congestion, ear pain and sore throat.   Eyes: Negative for pain and visual disturbance.  Respiratory: Positive for cough. Negative for shortness of breath.   Cardiovascular: Negative for chest pain and palpitations.  Gastrointestinal: Negative for abdominal pain, diarrhea, nausea and vomiting.  Genitourinary: Negative for dysuria and hematuria.  Musculoskeletal: Negative for arthralgias and back pain.  Skin: Negative for color change and rash.  Neurological: Negative for seizures and syncope.  Psychiatric/Behavioral: Negative for sleep disturbance.  All other systems reviewed and are negative.    Physical Exam Triage Vital Signs ED Triage Vitals  Enc Vitals Group     BP 12/28/17 1546 128/69     Pulse Rate 12/28/17 1546 (!) 102     Resp 12/28/17 1546 18     Temp 12/28/17 1546 98 F (36.7 C)     Temp Source 12/28/17 1546 Oral     SpO2 12/28/17 1546 95 %     Weight --      Height --      Head Circumference --      Peak Flow --      Pain Score 12/28/17 1547 6     Pain Loc --      Pain Edu? --      Excl. in [place]? --    Orthostatic VS for the past 24 hrs:  BP- Lying Pulse- Lying BP- Sitting Pulse- Sitting BP- Standing at 0 minutes Pulse- Standing at 0 minutes  12/28/17 1718 125/73 101 128/70 110 123/62 115    Updated  Vital Signs BP 125/73 (BP Location: Right Arm)   Pulse (!) 101   Temp 98.8 F (37.1 C) (Oral)   Resp 16   SpO2 95%      Physical Exam Constitutional:      General: He is not in acute distress.    Appearance: He is well-developed and normal weight. He is ill-appearing.     Comments: Very little verbalization.  Appears ill.  Shaking chills noted.  HENT:     Head: Normocephalic and atraumatic.     Right Ear:  Tympanic membrane, ear canal and external ear normal.     Left Ear: Tympanic membrane, ear canal and external ear normal.     Nose: Nose normal.     Mouth/Throat:     Mouth: Mucous membranes are moist.  Eyes:     Conjunctiva/sclera: Conjunctivae normal.     Pupils: Pupils are equal, round, and reactive to light.  Neck:     Musculoskeletal: Normal range of motion and neck supple.  Cardiovascular:     Rate and Rhythm: Normal rate and regular rhythm.     Heart sounds: Normal heart sounds.     Comments: No ectopy in 60 seconds of auscultation Pulmonary:     Effort: Pulmonary effort is normal. No respiratory distress.     Breath sounds: Normal breath sounds. No rhonchi or rales.  Abdominal:     General: Abdomen is flat. Bowel sounds are normal. There is no distension.     Palpations: Abdomen is soft.     Comments: No organomegaly  Musculoskeletal: Normal range of motion.  Lymphadenopathy:     Cervical: No cervical adenopathy.  Skin:    General: Skin is warm and dry.  Neurological:     General: No focal deficit present.     Mental Status: He is alert. Mental status is at baseline.  Psychiatric:        Mood and Affect: Mood normal.        Thought Content: Thought content normal.     Comments: Quiet, answers questions appropriately when directed      UC Treatments / Results  Labs (all labs ordered are listed, but only abnormal results are displayed) Labs Reviewed  POCT INFLUENZA A/B   Influenza test negative EKG None  Radiology No results  found.  Procedures Procedures (including critical care time)  Medications Ordered in UC Medications - No data to display  Initial Impression / Assessment and Plan / UC Course  I have reviewed the triage vital signs and the nursing notes.  Pertinent labs & imaging results that were available during my care of the patient were reviewed by me and considered in my medical decision making (see chart for details).     *I talked to the patient his wife about his illness.  I am really concerned with his level of shaking chills and body aches.  He has absence of fever which is unusual for influenza, but no other sign of infection.  Denies urinary symptoms, cough or respiratory symptoms, GI symptoms vomiting or diarrhea.  I encouraged him to go to the emergency room for additional blood work.  With shaking chills I am worried about sepsis.  Patient declines and wants home treatment.  He will drink more fluids.  I am going to give him Tamiflu because although his flu test is negative, medically he has sudden onset of body aches prostration and infection. Final Clinical Impressions(s) / UC Diagnoses   Final diagnoses:  Flu-like symptoms     Discharge Instructions     Must get plenty of rest Push fluids.  Keep a drink at bedside. Take ibuprofen up to 3 times a day with food This is for body ache and fever.  Should help with chills Take the Tamiflu twice a day as directed Go to ER promptly if worse instead of better   ED Prescriptions    Medication Sig Dispense Auth. Provider   ibuprofen (ADVIL,MOTRIN) 800 MG tablet Take 1 tablet (800 mg total) by mouth 3 (three) times daily. 21  tablet Raylene Everts, MD   oseltamivir (TAMIFLU) 75 MG capsule Take 1 capsule (75 mg total) by mouth every 12 (twelve) hours. 10 capsule Raylene Everts, MD     Controlled Substance Prescriptions [place] Controlled Substance Registry consulted? Not Applicable   Raylene Everts, MD 12/28/17 [phone]

## 2017-12-28 NOTE — ED Notes (Signed)
Patient able to ambulate independently  

## 2018-01-11 ENCOUNTER — Encounter: Payer: Self-pay | Admitting: Physician Assistant

## 2018-01-13 MED ORDER — AMPHETAMINE-DEXTROAMPHETAMINE 15 MG PO TABS: 15.0000 mg | ORAL_TABLET | Freq: Every day | ORAL | 0 refills | Status: DC

## 2018-01-13 MED ORDER — AMPHETAMINE-DEXTROAMPHET ER 15 MG PO CP24: ORAL_CAPSULE | ORAL | 0 refills | Status: DC

## 2018-01-13 NOTE — Addendum Note (Signed)
Addended by: Brunetta Jeans on: 01/13/2018 09:33 AM   Modules accepted: Orders

## 2018-01-25 ENCOUNTER — Encounter: Payer: Self-pay | Admitting: Physician Assistant

## 2018-01-26 MED ORDER — TRAMADOL HCL 50 MG PO TABS: 50.0000 mg | ORAL_TABLET | Freq: Every day | ORAL | 0 refills | Status: DC | PRN

## 2018-02-12 ENCOUNTER — Encounter: Payer: Self-pay | Admitting: Physician Assistant

## 2018-02-12 MED ORDER — AMPHETAMINE-DEXTROAMPHETAMINE 15 MG PO TABS: 15.0000 mg | ORAL_TABLET | Freq: Every day | ORAL | 0 refills | Status: DC

## 2018-02-12 MED ORDER — AMPHETAMINE-DEXTROAMPHET ER 15 MG PO CP24: ORAL_CAPSULE | ORAL | 0 refills | Status: DC

## 2018-02-23 ENCOUNTER — Encounter: Payer: Self-pay | Admitting: Physician Assistant

## 2018-02-23 MED ORDER — TRAMADOL HCL 50 MG PO TABS: 50.0000 mg | ORAL_TABLET | Freq: Every day | ORAL | 0 refills | Status: DC | PRN

## 2018-02-23 NOTE — Telephone Encounter (Signed)
Indication for chronic opioid: Low back pain w/out sciatica Medication and dose: Tramadol 50 mg # pills per month: 30 on 01/26/18 Last UDS date: None Opioid Treatment Agreement signed (Y/N): None Opioid Treatment Agreement last reviewed with patient:   [facility] reviewed this encounter (include red flags):     LOV: 09/30/17 Acute lightheaded

## 2018-03-01 ENCOUNTER — Ambulatory Visit: Payer: 59 | Admitting: Physician Assistant

## 2018-03-02 ENCOUNTER — Encounter: Payer: Self-pay | Admitting: Physician Assistant

## 2018-03-02 ENCOUNTER — Other Ambulatory Visit: Payer: Self-pay

## 2018-03-02 ENCOUNTER — Ambulatory Visit: Payer: 59 | Admitting: Physician Assistant

## 2018-03-02 VITALS — BP 112/64 | HR 88 | Temp 97.5°F | Resp 16 | Ht 70.5 in | Wt 161.0 lb

## 2018-03-02 DIAGNOSIS — E785 Hyperlipidemia, unspecified: Secondary | ICD-10-CM

## 2018-03-02 DIAGNOSIS — R5382 Chronic fatigue, unspecified: Secondary | ICD-10-CM

## 2018-03-02 DIAGNOSIS — G6289 Other specified polyneuropathies: Secondary | ICD-10-CM | POA: Diagnosis not present

## 2018-03-02 LAB — CBC WITH DIFFERENTIAL/PLATELET
Basophils Absolute: 0 10*3/uL (ref 0.0–0.1)
Basophils Relative: 1.1 % (ref 0.0–3.0)
Eosinophils Absolute: 0.1 10*3/uL (ref 0.0–0.7)
Eosinophils Relative: 2.8 % (ref 0.0–5.0)
HCT: 43.9 % (ref 39.0–52.0)
Hemoglobin: 14.8 g/dL (ref 13.0–17.0)
Lymphocytes Relative: 28 % (ref 12.0–46.0)
Lymphs Abs: 1.2 10*3/uL (ref 0.7–4.0)
MCHC: 33.8 g/dL (ref 30.0–36.0)
MCV: 91.3 fl (ref 78.0–100.0)
Monocytes Absolute: 0.3 10*3/uL (ref 0.1–1.0)
Monocytes Relative: 7.9 % (ref 3.0–12.0)
Neutro Abs: 2.6 10*3/uL (ref 1.4–7.7)
Neutrophils Relative %: 60.2 % (ref 43.0–77.0)
Platelets: 247 10*3/uL (ref 150.0–400.0)
RBC: 4.8 Mil/uL (ref 4.22–5.81)
RDW: 13.7 % (ref 11.5–15.5)
WBC: 4.2 10*3/uL (ref 4.0–10.5)

## 2018-03-02 LAB — LIPID PANEL
Cholesterol: 131 mg/dL (ref 0–200)
HDL: 37 mg/dL — ABNORMAL LOW (ref >39.00)
LDL Cholesterol: 61 mg/dL (ref 0–99)
NonHDL: 93.6
Total CHOL/HDL Ratio: 4
Triglycerides: 161 mg/dL — ABNORMAL HIGH (ref 0.0–149.0)
VLDL: 32.2 mg/dL (ref 0.0–40.0)

## 2018-03-02 LAB — COMPREHENSIVE METABOLIC PANEL
ALT: 19 U/L (ref 0–53)
AST: 24 U/L (ref 0–37)
Albumin: 3.9 g/dL (ref 3.5–5.2)
Alkaline Phosphatase: 96 U/L (ref 39–117)
BUN: 23 mg/dL (ref 6–23)
CO2: 27 mEq/L (ref 19–32)
Calcium: 9.4 mg/dL (ref 8.4–10.5)
Chloride: 108 mEq/L (ref 96–112)
Creatinine, Ser: 1.19 mg/dL (ref 0.40–1.50)
GFR: 60.38 mL/min (ref >60.00)
Glucose, Bld: 67 mg/dL — ABNORMAL LOW (ref 70–99)
Potassium: 3.9 mEq/L (ref 3.5–5.1)
Sodium: 143 mEq/L (ref 135–145)
Total Bilirubin: 0.5 mg/dL (ref 0.2–1.2)
Total Protein: 6.8 g/dL (ref 6.0–8.3)

## 2018-03-02 LAB — HEMOGLOBIN A1C: Hgb A1c MFr Bld: 5.3 % (ref 4.6–6.5)

## 2018-03-02 LAB — B12 AND FOLATE PANEL
Folate: 18.4 ng/mL (ref >5.9)
Vitamin B-12: 679 pg/mL (ref 211–911)

## 2018-03-02 LAB — TSH: TSH: 2.43 u[IU]/mL (ref 0.35–4.50)

## 2018-03-02 LAB — VITAMIN D 25 HYDROXY (VIT D DEFICIENCY, FRACTURES): VITD: 39.27 ng/mL (ref 30.00–100.00)

## 2018-03-02 NOTE — Patient Instructions (Signed)
Please go to the lab today for blood work.  I will call you with your results. We will alter treatment regimen(s) if indicated by your results.   Please schedule an appointment with your eye doctor as this is well overdue. You will be contacted for an appointment with the podiatrist.

## 2018-03-02 NOTE — Progress Notes (Signed)
Patient presents to clinic today for follow-up of ADD and Chronic Lumbago. Patient also with acute concerns at today's visit.   ADD -- Patient is currently on a regimen of Adderall XR 15 mg daily. Also takes short-acting Adderall 15 mg in the early afternoon. Endorses taking medications as directed. Notes this regimen is still working very well for him. Is able to sleep with this medication. Denies change in appetite. .  Chronic Lumbago -- Patient currently on Tramadol which helps significantly.  OA of lumbar spine, cerviical spine, knee pain bilaterally. Denies nausea or constipation. Pain currently a 0/10 pain with medication this morning.  Patient also endorses ongoing fatigue associated with intermittent tingling of arms and legs bilaterally. Notes his feet get very cold and will be uncomfortable. Denies color change. Denies this symptom of hands. Denies any change in mentation, just the fatigue. Is asymptomatic presently.  Past Medical History:  Diagnosis Date  . Allergy   . GERD (gastroesophageal reflux disease)   . History of chickenpox   . History of diverticulitis 2007  . History of kidney stones   . Hypertension   . Renal disorder    kidney stones    Current Outpatient Medications on File Prior to Visit  Medication Sig Dispense Refill  . acetaminophen (TYLENOL) 500 MG tablet Take 500 mg by mouth every 6 (six) hours as needed for fever or headache (pain).     Marland Kitchen amphetamine-dextroamphetamine (ADDERALL XR) 15 MG 24 hr capsule Take one tablet (10 mg) by mouth daily on work days 30 capsule 0  . amphetamine-dextroamphetamine (ADDERALL) 15 MG tablet Take 1 tablet by mouth daily with breakfast. 30 tablet 0  . aspirin EC 81 MG tablet Take 81 mg by mouth at bedtime.     Marland Kitchen atorvastatin (LIPITOR) 40 MG tablet Take 1 tablet (40 mg total) by mouth at bedtime.  90 tablet 1  . EPINEPHrine (EPIPEN 2-PAK) 0.3 mg/0.3 mL IJ SOAJ injection Inject 0.3 mg into the muscle once as needed (severe allergic reaction).    . fluticasone (FLONASE) 50 MCG/ACT nasal spray Place 2 sprays into both nostrils daily. 16 g 6  . lisinopril (PRINIVIL,ZESTRIL) 20 MG tablet Take 1 tablet (20 mg total) by mouth at bedtime. 90 tablet 1  . Multiple Vitamin (MULTIVITAMIN WITH MINERALS) TABS tablet Take 1 tablet by mouth daily.    Marland Kitchen OVER THE COUNTER MEDICATION Apply 1 application topically daily as needed (for itchy).    . pantoprazole (PROTONIX) 40 MG tablet Take 1 tablet (40 mg total) by mouth daily. 90 tablet 1  . tamsulosin (FLOMAX) 0.4 MG CAPS capsule Take 1 capsule (0.4 mg total) by mouth daily after supper. 90 capsule 1  . traMADol (ULTRAM) 50 MG tablet Take 1 tablet (50 mg total) by mouth daily as needed. 15 tablet 0  . vitamin B-12 (CYANOCOBALAMIN) 100 MCG tablet Take 1 tablet (100 mcg total) by mouth daily. 30 tablet 0  . vitamin C (ASCORBIC ACID) 500 MG tablet Take 500 mg by mouth daily.    . Vitamin D, Cholecalciferol, 1000 units CAPS Take 1 capsule by mouth daily. (  Patient taking differently: Take 1,000 Units by mouth every other day. ) 60 capsule 0   No current facility-administered medications on file prior to visit.     Allergies  Allergen Reactions  . Levaquin [Levofloxacin] Nausea And Vomiting  . Strattera [Atomoxetine Hcl] Other (See Comments)    Per wife it was ineffective but she does not remember a reaction  . Shellfish Allergy Hives    Family History  Problem Relation Age of Onset  . Heart disease Mother   . Hypertension Mother   . Stroke Mother     Social History   Socioeconomic History  . Marital status: Married    Spouse name: Not on file  . Number of children: Not on file  . Years of education: Not on file  . Highest education level: Not on file  Occupational History  . Not on file  Social Needs  . Financial resource strain: Not on file    . Food insecurity:    Worry: Not on file    Inability: Not on file  . Transportation needs:    Medical: Not on file    Non-medical: Not on file  Tobacco Use  . Smoking status: Former Research scientist (life sciences)  . Smokeless tobacco: Never Used  Substance and Sexual Activity  . Alcohol use: No  . Drug use: No  . Sexual activity: Yes  Lifestyle  . Physical activity:    Days per week: Not on file    Minutes per session: Not on file  . Stress: Not on file  Relationships  . Social connections:    Talks on phone: Not on file    Gets together: Not on file    Attends religious service: Not on file    Active member of club or organization: Not on file    Attends meetings of clubs or organizations: Not on file    Relationship status: Not on file  Other Topics Concern  . Not on file  Social History Narrative  . Not on file   Review of Systems - See HPI.  All other ROS are negative.  BP 112/64   Pulse 88   Temp (!) 97.5 F (36.4 C) (Oral)   Resp 16   Ht 5' 10.5" (1.791 m)   Wt 161 lb (73 kg)   SpO2 98%   BMI 22.77 kg/m   Physical Exam Vitals signs reviewed.  Constitutional:      Appearance: Normal appearance.  HENT:     Head: Normocephalic and atraumatic.     Right Ear: Tympanic membrane normal.     Left Ear: Tympanic membrane normal.     Nose: Nose normal.     Mouth/Throat:     Mouth: Mucous membranes are moist.  Eyes:     Conjunctiva/sclera: Conjunctivae normal.     Pupils: Pupils are equal, round, and reactive to light.  Neck:     Musculoskeletal: Neck supple.  Cardiovascular:     Rate and Rhythm: Normal rate and regular rhythm.     Heart sounds: Normal heart sounds.  Pulmonary:     Effort: Pulmonary effort is normal.     Breath sounds: Normal breath sounds.  Abdominal:     Palpations: Abdomen is soft.  Neurological:     General: No focal deficit present.     Mental Status: He is alert and oriented to person, place, and time.     Cranial Nerves: No cranial nerve deficit.      Sensory: No sensory deficit.  Motor: No weakness.     Gait: Gait normal.  Psychiatric:        Mood and Affect: Mood normal.     Recent Results (from the past 2160 hour(s))  POCT Influenza A/B     Status: None   Collection Time: 12/28/17  4:36 PM  Result Value Ref Range   Influenza A, POC Negative Negative   Influenza B, POC Negative Negative    Assessment/Plan: 1. Chronic fatigue Unclear etiology. Likely multifactorial. Will check lab panel today to further assess. 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 Unremarkable examination today. Will check labs as follows.  - Hemoglobin A1c - TSH - B12 and Folate Panel  3. Hyperlipidemia, unspecified hyperlipidemia type - Comprehensive metabolic panel - Lipid panel   Leeanne Rio, PA-C

## 2018-03-04 ENCOUNTER — Other Ambulatory Visit: Payer: Self-pay | Admitting: Physician Assistant

## 2018-03-04 DIAGNOSIS — D591 Other autoimmune hemolytic anemias: Principal | ICD-10-CM

## 2018-03-04 DIAGNOSIS — D5912 Cold autoimmune hemolytic anemia: Secondary | ICD-10-CM

## 2018-03-05 ENCOUNTER — Telehealth: Payer: Self-pay | Admitting: Hematology

## 2018-03-05 NOTE — Telephone Encounter (Signed)
Spoke with patient to confirm new patient appt 3/19 at 0830. Pt aware of location/date/time

## 2018-03-07 ENCOUNTER — Encounter: Payer: Self-pay | Admitting: Physician Assistant

## 2018-03-08 MED ORDER — TRAMADOL HCL 50 MG PO TABS: 50.0000 mg | ORAL_TABLET | Freq: Every day | ORAL | 0 refills | Status: DC | PRN

## 2018-03-08 MED ORDER — AMPHETAMINE-DEXTROAMPHETAMINE 15 MG PO TABS: 15.0000 mg | ORAL_TABLET | Freq: Every day | ORAL | 0 refills | Status: DC

## 2018-03-08 MED ORDER — AMPHETAMINE-DEXTROAMPHET ER 15 MG PO CP24: ORAL_CAPSULE | ORAL | 0 refills | Status: DC

## 2018-03-22 ENCOUNTER — Other Ambulatory Visit: Payer: Self-pay | Admitting: Hematology

## 2018-03-22 DIAGNOSIS — D5912 Cold autoimmune hemolytic anemia: Secondary | ICD-10-CM

## 2018-03-22 DIAGNOSIS — D591 Other autoimmune hemolytic anemias: Secondary | ICD-10-CM

## 2018-03-22 NOTE — Progress Notes (Signed)
[facility] NOTE  Patient Care Team: Delorse Limber as PCP - General (Family Medicine)  HEME/ONC OVERVIEW: 1. 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 He was seen in the ER in late 12/2017 for flulike symptoms, concerning for viral infection. He presented to his PCP for routine evaluation in late 02/2018, and the CBC required warming, suggesting possible cold agglutinin presence.  Patient was referred to hematology for the further evaluation. -Clinically, patient reports chronic fatigue, increased cold sensitivity and scattered rash, especially in the extremities -I personally reviewed the patient's peripheral blood smear, which showed normal RBC size and morphology with occasional fragmented RBC's, but no significant hemolysis; WBC and platelet size and morphology were normal; no significant RBC agglutination  -Labs normal, including Hgb and Tbili, arguing against significant hemolysis  -In light of the patient's recent viral illness, I suspect that the cold agglutinin is due to infection -I have ordered LDH, haptoglobin, Coomb's study, complement levels, cryoglobulin and cold agglutinin titer -In general, the diagnosis of cold-agglutinin disease requires evidence of hemolysis, positive Coomb's test and cold agglutinin titer equal or greater than 64 at 4 degree Celsius -If the presence of cold agglutinin is confirmed, then additional work-up to assess for any underlying cause, such as lymphoproliferative, would be indicated, including CT CAP to rule out any malignancy -If the work-up is negative for clinically significant cold-agglutinin  disease and malignancy, we can consider referring the patient to rheumatology for further work-up of any autoimmune disorder and the management of Raynaud phenomenon  -Finally, I counseled the patient on avoiding cold environment and to keep up-to-date with age-appropriate cancer screening   Leukopenia -WBC 3.7k with ANC 1800, new -Etiology unclear -Patient denies any symptoms of infection -I have ordered HIV, Hepatitis B and C serologies for the next visit -We will monitor it for now   Scattered rash -Chronic; 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 CBC with Differential (Cancer Center Only)    Standing Status:   Future    Standing Expiration Date:   04/29/2019  . CMP ([facility] only)    Standing Status:   Future    Standing Expiration Date:   04/29/2019  . HIV Antibody (routine testing w rflx)    Standing Status:   Future    Standing Expiration Date:   04/29/2019  . Hepatitis c antibody (reflex)    Standing Status:   Future    Standing Expiration Date:   04/29/2019  . Hepatitis B core antibody, total    Standing Status:   Future    Standing Expiration Date:   04/29/2019  . Hepatitis B surface antibody,qualitative    Standing Status:   Future    Standing Expiration Date:   04/29/2019  . Hepatitis B surface antigen    Standing Status:   Future    Standing Expiration Date:   04/29/2019    All questions were answered. The patient knows to call the clinic with any problems,  questions or concerns.  Return in 2 weeks for labs and clinic follow-up.   Tish Men, MD 03/25/2018 10:23 AM   CHIEF COMPLAINTS/PURPOSE OF CONSULTATION:  "I have this rash on my body"  HISTORY OF PRESENTING ILLNESS:  Gary [name] 73 y.o.  male is here because of possible presence of cold agglutinin.  Patient reports that he has had a chronic scattered rash for the past 4 to 5 years, initially starting on the hands, and has slowly spread to the upper chest and back of the neck over the past few years.  It is mildly pruritic, nontender, and slightly raised.  He has had a biopsy of a lesion on the right wrist, which reportedly showed granuloma annulare.he has not required any treatment.  In addition, he reports increased cold sensitivity in extremities for the past 3 to 4 years following the fingertips and toes, even when the temperature outside is not very cold.  He was seen in the ER in late 12/2017 for flulike symptoms, concerning for viral infection.  Since then, patient has had persistent mild fatigue.  There has not been any significant change in the cold sensitivity in extremities or the rash over the past few month.  He denies any constitutional symptoms, such as fever, night sweats, lymphadenopathy, chest pain, dyspnea, abdominal pain, nausea, vomiting, diarrhea, or abnormal bleeding/bruising.  MEDICAL HISTORY:  Past Medical History:  Diagnosis Date  . Allergy   . GERD (gastroesophageal reflux disease)   . History of chickenpox   . History of diverticulitis 2007  . History of kidney stones   . Hypertension   . Renal disorder    kidney stones    SURGICAL HISTORY: No past surgical history on file.  SOCIAL HISTORY: Social History   Socioeconomic History  . Marital status: Married    Spouse name: Not on file  . Number of children: Not on file  . Years of education: Not on file  . Highest education level: Not on file  Occupational History  . Not on file  Social Needs  . Financial resource strain: Not on file  . Food insecurity:    Worry: Not on file    Inability: Not on file  . Transportation needs:    Medical: Not on file    Non-medical: Not on file  Tobacco Use  . Smoking status: Former Research scientist (life sciences)  . Smokeless  tobacco: Never Used  Substance and Sexual Activity  . Alcohol use: No  . Drug use: No  . Sexual activity: Yes  Lifestyle  . Physical activity:    Days per week: Not on file    Minutes per session: Not on file  . Stress: Not on file  Relationships  . Social connections:    Talks on phone: Not on file    Gets together: Not on file    Attends religious service: Not on file    Active member of club or organization: Not on file    Attends meetings of clubs or organizations: Not on file    Relationship status: Not on file  . Intimate partner violence:    Fear of current or ex partner: Not on file    Emotionally abused: Not on file    Physically abused: Not on file    Forced sexual activity: Not on file  Other Topics Concern  . Not on file  Social History Narrative  . Not on file    FAMILY HISTORY: Family History  Problem Relation Age of Onset  .  Heart disease Mother   . Hypertension Mother   . Stroke Mother     ALLERGIES:  is allergic to levaquin [levofloxacin]; strattera [atomoxetine hcl]; and shellfish allergy.  MEDICATIONS:  Current Outpatient Medications  Medication Sig Dispense Refill  . acetaminophen (TYLENOL) 500 MG tablet Take 500 mg by mouth every 6 (six) hours as needed for fever or headache (pain).     Marland Kitchen amphetamine-dextroamphetamine (ADDERALL XR) 15 MG 24 hr capsule Take one tablet (10 mg) by mouth daily on work days 30 capsule 0  . amphetamine-dextroamphetamine (ADDERALL) 15 MG tablet Take 1 tablet by mouth daily with breakfast. 30 tablet 0  . aspirin EC 81 MG tablet Take 81 mg by mouth at bedtime.     Marland Kitchen atorvastatin (LIPITOR) 40 MG tablet Take 1 tablet (40 mg total) by mouth at bedtime. 90 tablet 1  . EPINEPHrine (EPIPEN 2-PAK) 0.3 mg/0.3 mL IJ SOAJ injection Inject 0.3 mg into the muscle once as needed (severe allergic reaction).    . fluticasone (FLONASE) 50 MCG/ACT nasal spray Place 2 sprays into both nostrils daily. 16 g 6  . lisinopril (PRINIVIL,ZESTRIL)  20 MG tablet Take 1 tablet (20 mg total) by mouth at bedtime. 90 tablet 1  . Multiple Vitamin (MULTIVITAMIN WITH MINERALS) TABS tablet Take 1 tablet by mouth daily.    Marland Kitchen OVER THE COUNTER MEDICATION Apply 1 application topically daily as needed (for itchy).    . pantoprazole (PROTONIX) 40 MG tablet Take 1 tablet (40 mg total) by mouth daily. 90 tablet 1  . tamsulosin (FLOMAX) 0.4 MG CAPS capsule Take 1 capsule (0.4 mg total) by mouth daily after supper. 90 capsule 1  . traMADol (ULTRAM) 50 MG tablet Take 1 tablet (50 mg total) by mouth daily as needed. 30 tablet 0  . vitamin B-12 (CYANOCOBALAMIN) 100 MCG tablet Take 1 tablet (100 mcg total) by mouth daily. 30 tablet 0  . vitamin C (ASCORBIC ACID) 500 MG tablet Take 500 mg by mouth daily.    . Vitamin D, Cholecalciferol, 1000 units CAPS Take 1 capsule by mouth daily. (Patient taking differently: Take 1,000 Units by mouth every other day. ) 60 capsule 0   No current facility-administered medications for this visit.     REVIEW OF SYSTEMS:   Constitutional: ( - ) fevers, ( - )  chills , ( - ) night sweats Eyes: ( - ) blurriness of vision, ( - ) double vision, ( - ) watery eyes Ears, nose, mouth, throat, and face: ( - ) mucositis, ( - ) sore throat Respiratory: ( - ) cough, ( - ) dyspnea, ( - ) wheezes Cardiovascular: ( - ) palpitation, ( - ) chest discomfort, ( - ) lower extremity swelling Gastrointestinal:  ( - ) nausea, ( - ) heartburn, ( - ) change in bowel habits Skin: ( + ) abnormal skin rashes Lymphatics: ( - ) new lymphadenopathy, ( - ) easy bruising Neurological: ( + ) numbness, ( + ) tingling, ( - ) new weaknesses Behavioral/Psych: ( - ) mood change, ( - ) new changes  All other systems were reviewed with the patient and are negative.  PHYSICAL EXAMINATION: ECOG PERFORMANCE STATUS: 0 - Asymptomatic  Vitals:   03/25/18 0900  BP: (!) 135/58  Pulse: 75  Resp: 17  Temp: 97.9 F (36.6 C)  SpO2: 100%   There were no vitals filed  for this visit.  GENERAL: alert, no distress and comfortable, slightly thin SKIN: a few scattered patches of mildly  erythematous rash with irregular borders and central clearing over the bilateral wrists and upper chest, non-tender, non-purulent  EYES: conjunctiva are pink and non-injected, sclera clear OROPHARYNX: no exudate, no erythema; lips, buccal mucosa, and tongue normal  NECK: supple, non-tender LYMPH:  no palpable lymphadenopathy in the cervical LUNGS: clear to auscultation with normal breathing effort HEART: regular rate & rhythm, no murmurs, no lower extremity edema ABDOMEN: soft, non-tender, non-distended, normal bowel sounds Musculoskeletal: no cyanosis of digits and no clubbing  PSYCH: alert & oriented x 3, fluent speech NEURO: no focal motor/sensory deficits  LABORATORY DATA:  I have reviewed the data as listed Lab Results  Component Value Date   WBC 3.7 (L) 03/25/2018   HGB 13.6 03/25/2018   HCT 41.6 03/25/2018   MCV 91.8 03/25/2018   PLT 231 03/25/2018   Lab Results  Component Value Date   NA 143 03/25/2018   K 3.7 03/25/2018   CL 109 03/25/2018   CO2 27 03/25/2018   PATHOLOGY: I personally reviewed the patient's peripheral blood smear today.  The red blood cells were mostly normal in morphology with occasional fragmented RBC's.  There was no significant RBC agglutination.  The white blood cells were of normal morphology. There were no peripheral circulating blasts. The platelets were of normal size and I verified that there were no platelet clumping.

## 2018-03-24 ENCOUNTER — Telehealth: Payer: Self-pay | Admitting: Hematology

## 2018-03-24 NOTE — Telephone Encounter (Signed)
Spoke with patient and screened/ No symptoms °

## 2018-03-25 ENCOUNTER — Encounter: Payer: Self-pay | Admitting: Hematology

## 2018-03-25 ENCOUNTER — Inpatient Hospital Stay: Payer: 59 | Attending: Hematology | Admitting: Hematology

## 2018-03-25 ENCOUNTER — Inpatient Hospital Stay: Payer: 59

## 2018-03-25 ENCOUNTER — Other Ambulatory Visit: Payer: Self-pay

## 2018-03-25 VITALS — BP 135/58 | HR 75 | Temp 97.9°F | Resp 17

## 2018-03-25 DIAGNOSIS — Z87891 Personal history of nicotine dependence: Secondary | ICD-10-CM | POA: Diagnosis not present

## 2018-03-25 DIAGNOSIS — D591 Other autoimmune hemolytic anemias: Principal | ICD-10-CM

## 2018-03-25 DIAGNOSIS — D5912 Cold autoimmune hemolytic anemia: Secondary | ICD-10-CM

## 2018-03-25 DIAGNOSIS — R5382 Chronic fatigue, unspecified: Secondary | ICD-10-CM | POA: Insufficient documentation

## 2018-03-25 DIAGNOSIS — D72819 Decreased white blood cell count, unspecified: Secondary | ICD-10-CM | POA: Insufficient documentation

## 2018-03-25 DIAGNOSIS — L920 Granuloma annulare: Secondary | ICD-10-CM | POA: Diagnosis not present

## 2018-03-25 DIAGNOSIS — Z79899 Other long term (current) drug therapy: Secondary | ICD-10-CM | POA: Diagnosis not present

## 2018-03-25 DIAGNOSIS — I10 Essential (primary) hypertension: Secondary | ICD-10-CM | POA: Insufficient documentation

## 2018-03-25 LAB — CBC WITH DIFFERENTIAL (CANCER CENTER ONLY)
Abs Immature Granulocytes: 0.01 10*3/uL (ref 0.00–0.07)
Basophils Absolute: 0 10*3/uL (ref 0.0–0.1)
Basophils Relative: 1 %
Eosinophils Absolute: 0.1 10*3/uL (ref 0.0–0.5)
Eosinophils Relative: 4 %
HCT: 41.6 % (ref 39.0–52.0)
Hemoglobin: 13.6 g/dL (ref 13.0–17.0)
Immature Granulocytes: 0 %
Lymphocytes Relative: 38 %
Lymphs Abs: 1.4 10*3/uL (ref 0.7–4.0)
MCH: 30 pg (ref 26.0–34.0)
MCHC: 32.7 g/dL (ref 30.0–36.0)
MCV: 91.8 fL (ref 80.0–100.0)
Monocytes Absolute: 0.3 10*3/uL (ref 0.1–1.0)
Monocytes Relative: 8 %
Neutro Abs: 1.8 10*3/uL (ref 1.7–7.7)
Neutrophils Relative %: 49 %
Platelet Count: 231 10*3/uL (ref 150–400)
RBC: 4.53 MIL/uL (ref 4.22–5.81)
RDW: 12.3 % (ref 11.5–15.5)
WBC Count: 3.7 10*3/uL — ABNORMAL LOW (ref 4.0–10.5)
nRBC: 0 % (ref 0.0–0.2)

## 2018-03-25 LAB — BASIC METABOLIC PANEL - CANCER CENTER ONLY
Anion gap: 7 (ref 5–15)
BUN: 20 mg/dL (ref 8–23)
CO2: 27 mmol/L (ref 22–32)
Calcium: 8.9 mg/dL (ref 8.9–10.3)
Chloride: 109 mmol/L (ref 98–111)
Creatinine: 1.4 mg/dL — ABNORMAL HIGH (ref 0.61–1.24)
GFR, Est AFR Am: 59 mL/min — ABNORMAL LOW (ref >60)
GFR, Estimated: 51 mL/min — ABNORMAL LOW (ref >60)
Glucose, Bld: 111 mg/dL — ABNORMAL HIGH (ref 70–99)
Potassium: 3.7 mmol/L (ref 3.5–5.1)
Sodium: 143 mmol/L (ref 135–145)

## 2018-03-25 LAB — HEPATIC FUNCTION PANEL
ALT: 23 U/L (ref 0–44)
AST: 25 U/L (ref 15–41)
Albumin: 3.8 g/dL (ref 3.5–5.0)
Alkaline Phosphatase: 89 U/L (ref 38–126)
Bilirubin, Direct: 0.1 mg/dL (ref 0.0–0.2)
Indirect Bilirubin: 0.5 mg/dL (ref 0.3–0.9)
Total Bilirubin: 0.6 mg/dL (ref 0.3–1.2)
Total Protein: 6.6 g/dL (ref 6.5–8.1)

## 2018-03-25 LAB — LACTATE DEHYDROGENASE: LDH: 150 U/L (ref 98–192)

## 2018-03-25 LAB — DIRECT ANTIGLOBULIN TEST (NOT AT ARMC)
DAT, IgG: NEGATIVE
DAT, complement: NEGATIVE

## 2018-03-25 LAB — SAVE SMEAR(SSMR), FOR PROVIDER SLIDE REVIEW

## 2018-03-26 ENCOUNTER — Encounter: Payer: Self-pay | Admitting: Physician Assistant

## 2018-03-26 LAB — COLD AGGLUTININ TITER: Cold Agglutinin Titer: 1:4 {titer}

## 2018-03-26 LAB — HAPTOGLOBIN: Haptoglobin: 157 mg/dL (ref 32–363)

## 2018-03-29 LAB — CRYOGLOBULIN

## 2018-04-07 ENCOUNTER — Encounter: Payer: Self-pay | Admitting: Hematology

## 2018-04-08 NOTE — Progress Notes (Signed)
[facility] OFFICE PROGRESS NOTE  Patient Care Team: Delorse Limber as PCP - General (Family Medicine)  HEME/ONC OVERVIEW: 1. 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 -I reviewed the lab studies in detail with the patient -In summary, cold agglutinin titer, cryoglobulin, and the Coombs test were all negative, arguing against hemolysis; this was further supported by normal LDH and haptoglobin -Therefore, there is no evidence of cold agglutinin disease at this time, I suspect that the possible presence of cold agglutinin in 02/2018 was false positive  Raynaud's phenomenon -The symptoms of increased cold sensitivity, including discoloration of fingers in cold temperature and sensation change, are consistent with Raynaud's phenomenon -Evaluation for cold agglutinin disease negative as above -I have referred the patient to rheumatology for further evaluation and management  Leukopenia -WBC 3.8k today, stable  -Clinically, patient denies any symptoms of infection  -HIV, Hep B/C serologies pending -I personally reviewed the patient's peripheral blood smear, which showed normal WBC morphology without any dysplastic changes -Given the overall mild leukopenia and absence of concurrent anemia or thrombocytopenia, I suspect that it is most likely benign -Patient would like to continue routine CBC monitoring with his PCP, and if he develops worsening leukopenia or other cytopenias, he can be referred back to hematology for further management   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 Ambulatory referral to Rheumatology    Referral Priority:   Routine    Referral Type:   Consultation    Referral Reason:   Specialty Services Required    Requested Specialty:   Rheumatology    Number of Visits Requested:   1    All questions were answered. The patient knows to call the clinic with any problems, questions or concerns. No barriers to learning was detected.  Return as needed. Patient will continue routine follow-up with his PCP.   Tish Men, MD 04/09/2018 3:37 PM  CHIEF COMPLAINT: "I am doing okay"  INTERVAL HISTORY: Mr. Mccluney returns to clinic for follow-up of recent cold agglutinin testing results.  Patient reports that he still has chronic persistent fatigue, but otherwise has not had any other new symptoms since the last visit.  He has not had any frequent infections or requiring frequent antibiotic usage.  He reports feeling well, and denies any complaint today.  SUMMARY OF ONCOLOGIC HISTORY:  No history exists.    REVIEW OF SYSTEMS:   Constitutional: ( - ) fevers, ( - )  chills , ( - ) night sweats Eyes: ( - ) blurriness of vision, ( - ) double vision, ( - ) watery eyes Ears, nose, mouth, throat, and face: ( - ) mucositis, ( - ) sore throat Respiratory: ( - ) cough, ( - ) dyspnea, ( - ) wheezes Cardiovascular: ( - ) palpitation, ( - ) chest discomfort, ( - )  lower extremity swelling Gastrointestinal:  ( - ) nausea, ( - ) heartburn, ( - ) change in bowel habits Skin: ( + ) abnormal skin rashes Lymphatics: ( - ) new lymphadenopathy, ( - ) easy bruising Neurological: ( - ) numbness, ( - ) tingling, ( - ) new weaknesses Behavioral/Psych: ( - ) mood  change, ( - ) new changes  All other systems were reviewed with the patient and are negative.  I have reviewed the past medical history, past surgical history, social history and family history with the patient and they are unchanged from previous note.  ALLERGIES:  is allergic to levaquin [levofloxacin]; strattera [atomoxetine hcl]; and shellfish allergy.  MEDICATIONS:  Current Outpatient Medications  Medication Sig Dispense Refill  . acetaminophen (TYLENOL) 500 MG tablet Take 500 mg by mouth every 6 (six) hours as needed for fever or headache (pain).     Marland Kitchen amphetamine-dextroamphetamine (ADDERALL XR) 15 MG 24 hr capsule Take one tablet (10 mg) by mouth daily on work days 30 capsule 0  . amphetamine-dextroamphetamine (ADDERALL) 15 MG tablet Take 1 tablet by mouth daily with breakfast. 30 tablet 0  . aspirin EC 81 MG tablet Take 81 mg by mouth at bedtime.     Marland Kitchen atorvastatin (LIPITOR) 40 MG tablet Take 1 tablet (40 mg total) by mouth at bedtime. 90 tablet 1  . EPINEPHrine (EPIPEN 2-PAK) 0.3 mg/0.3 mL IJ SOAJ injection Inject 0.3 mg into the muscle once as needed (severe allergic reaction).    . fluticasone (FLONASE) 50 MCG/ACT nasal spray Place 2 sprays into both nostrils daily. 16 g 6  . lisinopril (PRINIVIL,ZESTRIL) 20 MG tablet Take 1 tablet (20 mg total) by mouth at bedtime. 90 tablet 1  . Multiple Vitamin (MULTIVITAMIN WITH MINERALS) TABS tablet Take 1 tablet by mouth daily.    Marland Kitchen OVER THE COUNTER MEDICATION Apply 1 application topically daily as needed (for itchy).    . pantoprazole (PROTONIX) 40 MG tablet Take 1 tablet (40 mg total) by mouth daily. 90 tablet 1  . tamsulosin (FLOMAX) 0.4 MG CAPS capsule Take 1 capsule (0.4 mg total) by mouth daily after supper. 90 capsule 1  . traMADol (ULTRAM) 50 MG tablet Take 1 tablet (50 mg total) by mouth daily as needed. 30 tablet 0  . vitamin B-12 (CYANOCOBALAMIN) 100 MCG tablet Take 1 tablet (100 mcg total) by mouth daily. 30 tablet 0  . vitamin C  (ASCORBIC ACID) 500 MG tablet Take 500 mg by mouth daily.    . Vitamin D, Cholecalciferol, 1000 units CAPS Take 1 capsule by mouth daily. (Patient taking differently: Take 1,000 Units by mouth every other day. ) 60 capsule 0   No current facility-administered medications for this visit.     PHYSICAL EXAMINATION: ECOG PERFORMANCE STATUS: 0 - Asymptomatic  There were no vitals filed for this visit. There is no height or weight on file to calculate BMI.  There were no vitals filed for this visit.  GENERAL: alert, no distress and comfortable, thin SKIN: a few faint scattered annular rashes over the chest and bilateral forearms EYES: conjunctiva are pink and non-injected, sclera clear OROPHARYNX: no exudate, no erythema; lips, buccal mucosa, and tongue normal  NECK: supple, non-tender LUNGS: clear to auscultation with normal breathing effort HEART: regular rate & rhythm and no murmurs and no lower extremity edema ABDOMEN: soft, non-tender, non-distended, normal bowel sounds Musculoskeletal: no cyanosis of digits PSYCH: alert & oriented x 3, fluent speech  LABORATORY DATA:  I have reviewed  the data as listed    Component Value Date/Time   NA 143 04/09/2018 1425   K 3.5 04/09/2018 1425   CL 108 04/09/2018 1425   CO2 26 04/09/2018 1425   GLUCOSE 111 (H) 04/09/2018 1425   BUN 17 04/09/2018 1425   CREATININE 1.45 (H) 04/09/2018 1425   CALCIUM 9.0 04/09/2018 1425   PROT 6.8 04/09/2018 1425   ALBUMIN 4.0 04/09/2018 1425   AST 22 04/09/2018 1425   ALT 19 04/09/2018 1425   ALKPHOS 89 04/09/2018 1425   BILITOT 0.5 04/09/2018 1425   GFRNONAA 48 (L) 04/09/2018 1425   GFRAA 56 (L) 04/09/2018 1425    No results found for: SPEP, UPEP  Lab Results  Component Value Date   WBC 3.8 (L) 04/09/2018   NEUTROABS 2.3 04/09/2018   HGB 13.7 04/09/2018   HCT 41.3 04/09/2018   MCV 91.8 04/09/2018   PLT 234 04/09/2018      Chemistry      Component Value Date/Time   NA 143 04/09/2018 1425    K 3.5 04/09/2018 1425   CL 108 04/09/2018 1425   CO2 26 04/09/2018 1425   BUN 17 04/09/2018 1425   CREATININE 1.45 (H) 04/09/2018 1425      Component Value Date/Time   CALCIUM 9.0 04/09/2018 1425   ALKPHOS 89 04/09/2018 1425   AST 22 04/09/2018 1425   ALT 19 04/09/2018 1425   BILITOT 0.5 04/09/2018 1425       RADIOGRAPHIC STUDIES: I have personally reviewed the radiological images as listed below and agreed with the findings in the report. No results found.

## 2018-04-09 ENCOUNTER — Inpatient Hospital Stay: Payer: 59 | Attending: Hematology

## 2018-04-09 ENCOUNTER — Inpatient Hospital Stay (HOSPITAL_BASED_OUTPATIENT_CLINIC_OR_DEPARTMENT_OTHER): Payer: 59 | Admitting: Hematology

## 2018-04-09 ENCOUNTER — Encounter: Payer: Self-pay | Admitting: Physician Assistant

## 2018-04-09 ENCOUNTER — Telehealth: Payer: Self-pay | Admitting: Hematology

## 2018-04-09 ENCOUNTER — Other Ambulatory Visit: Payer: Self-pay

## 2018-04-09 ENCOUNTER — Encounter: Payer: Self-pay | Admitting: Hematology

## 2018-04-09 VITALS — BP 123/64 | HR 91 | Temp 98.1°F | Resp 16

## 2018-04-09 DIAGNOSIS — N189 Chronic kidney disease, unspecified: Secondary | ICD-10-CM

## 2018-04-09 DIAGNOSIS — D5912 Cold autoimmune hemolytic anemia: Secondary | ICD-10-CM

## 2018-04-09 DIAGNOSIS — I7300 Raynaud's syndrome without gangrene: Secondary | ICD-10-CM | POA: Insufficient documentation

## 2018-04-09 DIAGNOSIS — N183 Chronic kidney disease, stage 3 (moderate): Secondary | ICD-10-CM | POA: Diagnosis not present

## 2018-04-09 DIAGNOSIS — D72819 Decreased white blood cell count, unspecified: Secondary | ICD-10-CM | POA: Insufficient documentation

## 2018-04-09 DIAGNOSIS — Z719 Counseling, unspecified: Secondary | ICD-10-CM

## 2018-04-09 DIAGNOSIS — Z79899 Other long term (current) drug therapy: Secondary | ICD-10-CM | POA: Diagnosis not present

## 2018-04-09 DIAGNOSIS — D591 Other autoimmune hemolytic anemias: Secondary | ICD-10-CM | POA: Diagnosis present

## 2018-04-09 DIAGNOSIS — L920 Granuloma annulare: Secondary | ICD-10-CM | POA: Diagnosis not present

## 2018-04-09 LAB — CMP (CANCER CENTER ONLY)
ALT: 19 U/L (ref 0–44)
AST: 22 U/L (ref 15–41)
Albumin: 4 g/dL (ref 3.5–5.0)
Alkaline Phosphatase: 89 U/L (ref 38–126)
Anion gap: 9 (ref 5–15)
BUN: 17 mg/dL (ref 8–23)
CO2: 26 mmol/L (ref 22–32)
Calcium: 9 mg/dL (ref 8.9–10.3)
Chloride: 108 mmol/L (ref 98–111)
Creatinine: 1.45 mg/dL — ABNORMAL HIGH (ref 0.61–1.24)
GFR, Est AFR Am: 56 mL/min — ABNORMAL LOW (ref >60)
GFR, Estimated: 48 mL/min — ABNORMAL LOW (ref >60)
Glucose, Bld: 111 mg/dL — ABNORMAL HIGH (ref 70–99)
Potassium: 3.5 mmol/L (ref 3.5–5.1)
Sodium: 143 mmol/L (ref 135–145)
Total Bilirubin: 0.5 mg/dL (ref 0.3–1.2)
Total Protein: 6.8 g/dL (ref 6.5–8.1)

## 2018-04-09 LAB — CBC WITH DIFFERENTIAL (CANCER CENTER ONLY)
Abs Immature Granulocytes: 0.01 10*3/uL (ref 0.00–0.07)
Basophils Absolute: 0 10*3/uL (ref 0.0–0.1)
Basophils Relative: 1 %
Eosinophils Absolute: 0.1 10*3/uL (ref 0.0–0.5)
Eosinophils Relative: 2 %
HCT: 41.3 % (ref 39.0–52.0)
Hemoglobin: 13.7 g/dL (ref 13.0–17.0)
Immature Granulocytes: 0 %
Lymphocytes Relative: 30 %
Lymphs Abs: 1.2 10*3/uL (ref 0.7–4.0)
MCH: 30.4 pg (ref 26.0–34.0)
MCHC: 33.2 g/dL (ref 30.0–36.0)
MCV: 91.8 fL (ref 80.0–100.0)
Monocytes Absolute: 0.3 10*3/uL (ref 0.1–1.0)
Monocytes Relative: 7 %
Neutro Abs: 2.3 10*3/uL (ref 1.7–7.7)
Neutrophils Relative %: 60 %
Platelet Count: 234 10*3/uL (ref 150–400)
RBC: 4.5 MIL/uL (ref 4.22–5.81)
RDW: 12.6 % (ref 11.5–15.5)
WBC Count: 3.8 10*3/uL — ABNORMAL LOW (ref 4.0–10.5)
nRBC: 0 % (ref 0.0–0.2)

## 2018-04-09 MED ORDER — TRAMADOL HCL 50 MG PO TABS: 50.0000 mg | ORAL_TABLET | Freq: Every day | ORAL | 0 refills | Status: DC | PRN

## 2018-04-09 MED ORDER — AMPHETAMINE-DEXTROAMPHET ER 15 MG PO CP24: ORAL_CAPSULE | ORAL | 0 refills | Status: DC

## 2018-04-09 MED ORDER — AMPHETAMINE-DEXTROAMPHETAMINE 15 MG PO TABS: 15.0000 mg | ORAL_TABLET | Freq: Every day | ORAL | 0 refills | Status: DC

## 2018-04-09 NOTE — Telephone Encounter (Signed)
Return as needed per 4/3 los

## 2018-04-10 LAB — HEPATITIS B SURFACE ANTIBODY,QUALITATIVE: Hep B S Ab: REACTIVE

## 2018-04-10 LAB — HCV COMMENT:

## 2018-04-10 LAB — HIV ANTIBODY (ROUTINE TESTING W REFLEX): HIV Screen 4th Generation wRfx: NONREACTIVE

## 2018-04-10 LAB — HEPATITIS B SURFACE ANTIGEN: Hepatitis B Surface Ag: NEGATIVE

## 2018-04-10 LAB — HEPATITIS C ANTIBODY (REFLEX): HCV Ab: <0.1 s/co ratio (ref 0.0–0.9)

## 2018-04-10 LAB — HEPATITIS B CORE ANTIBODY, TOTAL: Hep B Core Total Ab: NEGATIVE

## 2018-04-13 ENCOUNTER — Telehealth: Payer: Self-pay | Admitting: *Deleted

## 2018-04-13 NOTE — Telephone Encounter (Signed)
As noted below by Dr. Maylon Peppers, I informed the patient that the viral studies were all negative. He verbalized understanding.

## 2018-04-13 NOTE — Telephone Encounter (Signed)
-----   Message from Tish Men, MD sent at 04/12/2018  8:51 AM EDT ----- Can we let the patient know that his viral studies were all negative? Thanks. [place] ----- Message ----- From: Buel Ream, Lab [facility] [place] Sent: 04/09/2018   2:44 PM EDT To: Tish Men, MD

## 2018-05-07 ENCOUNTER — Other Ambulatory Visit: Payer: Self-pay | Admitting: Hematology

## 2018-05-07 DIAGNOSIS — I7300 Raynaud's syndrome without gangrene: Secondary | ICD-10-CM

## 2018-05-10 ENCOUNTER — Encounter: Payer: Self-pay | Admitting: Physician Assistant

## 2018-05-10 MED ORDER — AMPHETAMINE-DEXTROAMPHETAMINE 15 MG PO TABS: 15.0000 mg | ORAL_TABLET | Freq: Every day | ORAL | 0 refills | Status: DC

## 2018-05-10 MED ORDER — TRAMADOL HCL 50 MG PO TABS: 50.0000 mg | ORAL_TABLET | Freq: Every day | ORAL | 0 refills | Status: DC | PRN

## 2018-05-10 MED ORDER — AMPHETAMINE-DEXTROAMPHET ER 15 MG PO CP24: ORAL_CAPSULE | ORAL | 0 refills | Status: DC

## 2018-05-10 NOTE — Telephone Encounter (Signed)
Will need follow-up before next fill. Will need updated CSC and UDS.

## 2018-05-10 NOTE — Telephone Encounter (Signed)
Adderall XR 15mg  last rx 04/09/18 #30 Adderall 15 mg last rx 04/09/18 #30 Tramadol last rx 04/09/18 #30 No CSC No UDS LOV: 03/02/18

## 2018-05-22 ENCOUNTER — Other Ambulatory Visit: Payer: Self-pay | Admitting: Physician Assistant

## 2018-05-22 DIAGNOSIS — N401 Enlarged prostate with lower urinary tract symptoms: Secondary | ICD-10-CM

## 2018-05-22 DIAGNOSIS — K219 Gastro-esophageal reflux disease without esophagitis: Secondary | ICD-10-CM

## 2018-05-22 DIAGNOSIS — I10 Essential (primary) hypertension: Secondary | ICD-10-CM

## 2018-06-10 ENCOUNTER — Other Ambulatory Visit: Payer: Self-pay | Admitting: *Deleted

## 2018-06-10 ENCOUNTER — Encounter: Payer: Self-pay | Admitting: Physician Assistant

## 2018-06-10 MED ORDER — AMPHETAMINE-DEXTROAMPHETAMINE 15 MG PO TABS: 15.0000 mg | ORAL_TABLET | Freq: Every day | ORAL | 0 refills | Status: DC

## 2018-06-10 MED ORDER — AMPHETAMINE-DEXTROAMPHET ER 15 MG PO CP24: ORAL_CAPSULE | ORAL | 0 refills | Status: DC

## 2018-06-10 MED ORDER — TRAMADOL HCL 50 MG PO TABS: 50.0000 mg | ORAL_TABLET | Freq: Every day | ORAL | 0 refills | Status: DC | PRN

## 2018-06-10 NOTE — Telephone Encounter (Signed)
Last OV: 03/02/2018 Last Filled for all medications: 05/10/2018 #30, 0

## 2018-07-10 ENCOUNTER — Encounter: Payer: Self-pay | Admitting: Physician Assistant

## 2018-07-12 MED ORDER — AMPHETAMINE-DEXTROAMPHETAMINE 15 MG PO TABS: 15.0000 mg | ORAL_TABLET | Freq: Every day | ORAL | 0 refills | Status: DC

## 2018-07-12 MED ORDER — TRAMADOL HCL 50 MG PO TABS: 50.0000 mg | ORAL_TABLET | Freq: Every day | ORAL | 0 refills | Status: DC | PRN

## 2018-07-12 MED ORDER — AMPHETAMINE-DEXTROAMPHET ER 15 MG PO CP24: ORAL_CAPSULE | ORAL | 0 refills | Status: DC

## 2018-08-13 ENCOUNTER — Encounter: Payer: Self-pay | Admitting: Physician Assistant

## 2018-08-13 MED ORDER — AMPHETAMINE-DEXTROAMPHETAMINE 15 MG PO TABS: 15.0000 mg | ORAL_TABLET | Freq: Every day | ORAL | 0 refills | Status: DC

## 2018-08-13 MED ORDER — AMPHETAMINE-DEXTROAMPHET ER 15 MG PO CP24: ORAL_CAPSULE | ORAL | 0 refills | Status: DC

## 2018-09-10 ENCOUNTER — Encounter: Payer: Self-pay | Admitting: Physician Assistant

## 2018-10-15 ENCOUNTER — Encounter: Payer: Self-pay | Admitting: Physician Assistant

## 2018-10-18 ENCOUNTER — Other Ambulatory Visit: Payer: Self-pay

## 2018-10-18 ENCOUNTER — Encounter: Payer: Self-pay | Admitting: Physician Assistant

## 2018-10-18 ENCOUNTER — Ambulatory Visit (INDEPENDENT_AMBULATORY_CARE_PROVIDER_SITE_OTHER): Payer: 59 | Admitting: Physician Assistant

## 2018-10-18 VITALS — BP 120/64 | HR 85 | Temp 97.7°F | Resp 16 | Ht 70.5 in | Wt 157.0 lb

## 2018-10-18 DIAGNOSIS — F909 Attention-deficit hyperactivity disorder, unspecified type: Secondary | ICD-10-CM

## 2018-10-18 MED ORDER — AMPHETAMINE-DEXTROAMPHET ER 15 MG PO CP24: ORAL_CAPSULE | ORAL | 0 refills | Status: DC

## 2018-10-18 MED ORDER — AMPHETAMINE-DEXTROAMPHETAMINE 15 MG PO TABS: 15.0000 mg | ORAL_TABLET | Freq: Every day | ORAL | 0 refills | Status: DC

## 2018-10-18 NOTE — Progress Notes (Signed)
Patient presents to clinic today for follow-up of ADD. Patient is currently on a regimen of Adderall XR 15 mg once daily and Adderall IR 15 mg as needed. Has been on this regimen for some time and tolerating very well overall. Gets good relief of symptoms with this regimen. [facility]. Needs updated CS contract here for ADD medication.   [facility] Marland Kitchen    Past Medical History:  Diagnosis Date  . Allergy   . GERD (gastroesophageal reflux disease)   . History of chickenpox   . History of diverticulitis 2007  . History of kidney stones   . Hypertension   . Renal disorder    kidney stones    Current Outpatient Medications on File Prior to Visit  Medication Sig Dispense Refill  . acetaminophen (TYLENOL) 500 MG tablet Take 500 mg by mouth every 6 (six) hours as needed for fever or headache (pain).     Marland Kitchen aspirin EC 81 MG tablet Take 81 mg by mouth at bedtime.     Marland Kitchen atorvastatin (LIPITOR) 40 MG tablet TAKE 1 TABLET BY MOUTH  EVERY NIGHT AT BEDTIME 90 tablet 1  . EPINEPHrine (EPIPEN 2-PAK) 0.3 mg/0.3 mL IJ SOAJ injection Inject 0.3 mg into the muscle once as needed (severe allergic reaction).    . fluticasone (FLONASE) 50 MCG/ACT nasal spray Place 2 sprays into both nostrils daily. 16 g 6  . lisinopril (ZESTRIL) 20 MG tablet TAKE 1 TABLET BY MOUTH  EVERY NIGHT AT BEDTIME 90 tablet 1  . Multiple Vitamin (MULTIVITAMIN WITH MINERALS) TABS tablet Take 1 tablet by mouth daily.    Marland Kitchen OVER THE COUNTER MEDICATION Apply 1 application topically daily as needed (for itchy).    . pantoprazole (PROTONIX) 40 MG tablet TAKE 1 TABLET BY MOUTH  DAILY 90 tablet 1  . tamsulosin (FLOMAX) 0.4 MG CAPS capsule TAKE 1 CAPSULE BY MOUTH  DAILY AFTER SUPPER 90 capsule 1  . traMADol (ULTRAM) 50 MG tablet Take 1 tablet (50 mg total) by mouth daily as needed. 30 tablet 0  . vitamin B-12 (CYANOCOBALAMIN) 100 MCG tablet Take 1 tablet (100 mcg total) by mouth daily. 30  tablet 0  . vitamin C (ASCORBIC ACID) 500 MG tablet Take 500 mg by mouth daily.    . Vitamin D, Cholecalciferol, 1000 units CAPS Take 1 capsule by mouth daily. (Patient taking differently: Take 1,000 Units by mouth every other day. ) 60 capsule 0   No current facility-administered medications on file prior to visit.     Allergies  Allergen Reactions  . Levaquin [Levofloxacin] Nausea And Vomiting  . Strattera [Atomoxetine Hcl] Other (See Comments)    Per wife it was ineffective but she does not remember a reaction  . Shellfish Allergy Hives    Family History  Problem Relation Age of Onset  . Heart disease Mother   . Hypertension Mother   . Stroke Mother     Social History   Socioeconomic History  . Marital status: Married    Spouse name: Not on file  . Number of children: Not on file  . Years of education: Not on file  . Highest education level: Not on file  Occupational History  . Not on file  Social Needs  . Financial resource strain: Not on file  . Food insecurity    Worry: Not on file    Inability: Not on file  . Transportation needs    Medical:  Not on file    Non-medical: Not on file  Tobacco Use  . Smoking status: Former Research scientist (life sciences)  . Smokeless tobacco: Never Used  Substance and Sexual Activity  . Alcohol use: No  . Drug use: No  . Sexual activity: Yes  Lifestyle  . Physical activity    Days per week: Not on file    Minutes per session: Not on file  . Stress: Not on file  Relationships  . Social Herbalist on phone: Not on file    Gets together: Not on file    Attends religious service: Not on file    Active member of club or organization: Not on file    Attends meetings of clubs or organizations: Not on file    Relationship status: Not on file  Other Topics Concern  . Not on file  Social History Narrative  . Not on file   Review of Systems - See HPI.  All other ROS are negative.  BP 120/64   Pulse 85   Temp 97.7 F (36.5 C) (Temporal)    Resp 16   Ht 5' 10.5" (1.791 m)   Wt 157 lb (71.2 kg)   SpO2 98%   BMI 22.21 kg/m   Physical Exam Vitals signs reviewed.  Constitutional:      Appearance: Normal appearance.  HENT:     Head: Normocephalic and atraumatic.  Eyes:     Conjunctiva/sclera: Conjunctivae normal.     Pupils: Pupils are equal, round, and reactive to light.  Neck:     Musculoskeletal: Neck supple.  Cardiovascular:     Rate and Rhythm: Normal rate and regular rhythm.     Pulses: Normal pulses.  Pulmonary:     Effort: Pulmonary effort is normal.  Neurological:     General: No focal deficit present.     Mental Status: He is alert and oriented to person, place, and time.  Psychiatric:        Mood and Affect: Mood normal.    Assessment/Plan: 1. Attention deficit hyperactivity disorder (ADHD), unspecified ADHD type Doing very well today. Good control of symptoms with current regimen. Vitals stable. CSC updated. Continue current regimen. Follow-up 6 months. RTC sooner if needed.    Leeanne Rio, PA-C

## 2018-10-18 NOTE — Patient Instructions (Addendum)
Please continue current medication regimen.  We will follow-up every 6 months for ADD  We are submitting a record request to [facility] to see about prior Prevnar vaccination.  Make sure to follow-up with the [facility] regarding colonoscopy!  Hang in there!

## 2018-11-12 ENCOUNTER — Encounter: Payer: Self-pay | Admitting: Physician Assistant

## 2018-11-15 MED ORDER — AMPHETAMINE-DEXTROAMPHET ER 15 MG PO CP24: ORAL_CAPSULE | ORAL | 0 refills | Status: DC

## 2018-11-15 MED ORDER — AMPHETAMINE-DEXTROAMPHETAMINE 15 MG PO TABS: 15.0000 mg | ORAL_TABLET | Freq: Every day | ORAL | 0 refills | Status: DC

## 2018-12-11 ENCOUNTER — Other Ambulatory Visit: Payer: Self-pay | Admitting: Physician Assistant

## 2018-12-11 ENCOUNTER — Encounter: Payer: Self-pay | Admitting: Physician Assistant

## 2018-12-13 ENCOUNTER — Encounter: Payer: Self-pay | Admitting: Physician Assistant

## 2018-12-13 MED ORDER — AMPHETAMINE-DEXTROAMPHET ER 15 MG PO CP24: ORAL_CAPSULE | ORAL | 0 refills | Status: DC

## 2018-12-13 NOTE — Telephone Encounter (Signed)
Last refill: 11.9.20 #30, 0 Last OV: 10.12.20 dx. ADD

## 2018-12-16 ENCOUNTER — Encounter: Payer: Self-pay | Admitting: Physician Assistant

## 2018-12-16 DIAGNOSIS — F909 Attention-deficit hyperactivity disorder, unspecified type: Secondary | ICD-10-CM

## 2018-12-16 MED ORDER — AMPHETAMINE-DEXTROAMPHETAMINE 15 MG PO TABS: 15.0000 mg | ORAL_TABLET | Freq: Every day | ORAL | 0 refills | Status: DC

## 2018-12-16 NOTE — Telephone Encounter (Signed)
Patient recently see on 10/18/18 for ADD. PCP refilled Adderall XR to the pharmacy and not the IR.

## 2019-01-22 ENCOUNTER — Encounter: Payer: Self-pay | Admitting: Physician Assistant

## 2019-01-22 DIAGNOSIS — F909 Attention-deficit hyperactivity disorder, unspecified type: Secondary | ICD-10-CM

## 2019-01-24 MED ORDER — AMPHETAMINE-DEXTROAMPHET ER 15 MG PO CP24: ORAL_CAPSULE | ORAL | 0 refills | Status: DC

## 2019-01-24 MED ORDER — AMPHETAMINE-DEXTROAMPHETAMINE 15 MG PO TABS: 15.0000 mg | ORAL_TABLET | Freq: Every day | ORAL | 0 refills | Status: DC

## 2019-02-28 ENCOUNTER — Encounter: Payer: Self-pay | Admitting: Physician Assistant

## 2019-03-01 NOTE — Telephone Encounter (Signed)
Pt has an appt on Friday, he wanted to know if this could be sent into the pharmacy before Friday.

## 2019-03-02 ENCOUNTER — Other Ambulatory Visit: Payer: Self-pay | Admitting: Physician Assistant

## 2019-03-02 DIAGNOSIS — F909 Attention-deficit hyperactivity disorder, unspecified type: Secondary | ICD-10-CM

## 2019-03-02 MED ORDER — AMPHETAMINE-DEXTROAMPHET ER 15 MG PO CP24: ORAL_CAPSULE | ORAL | 0 refills | Status: DC

## 2019-03-02 MED ORDER — AMPHETAMINE-DEXTROAMPHETAMINE 15 MG PO TABS: 15.0000 mg | ORAL_TABLET | Freq: Every day | ORAL | 0 refills | Status: DC

## 2019-03-02 NOTE — Telephone Encounter (Signed)
Refills sent to pharmacy. 

## 2019-03-04 ENCOUNTER — Encounter: Payer: Self-pay | Admitting: Physician Assistant

## 2019-03-04 ENCOUNTER — Ambulatory Visit (INDEPENDENT_AMBULATORY_CARE_PROVIDER_SITE_OTHER): Payer: 59 | Admitting: Physician Assistant

## 2019-03-04 ENCOUNTER — Other Ambulatory Visit: Payer: Self-pay

## 2019-03-04 DIAGNOSIS — F900 Attention-deficit hyperactivity disorder, predominantly inattentive type: Secondary | ICD-10-CM

## 2019-03-04 NOTE — Progress Notes (Signed)
Virtual Visit via Video   I connected with patient on 03/04/19 at  1:30 PM EST by a video enabled telemedicine application and verified that I am speaking with the correct person using two identifiers.  Location patient: Home Location provider: [facility], Office Persons participating in the virtual visit: Patient, Provider, [place] (Patina Moore)  I discussed the limitations of evaluation and management by telemedicine and the availability of in person appointments. The patient expressed understanding and agreed to proceed.  Subjective:   HPI:   Patient presents via [email or web address] today to follow-up regarding ADD.  Patient is currently on a regimen of Adderall XR 15 mg once daily.  Occasional use of short acting Adderall in the afternoon.  Endorses continuing to take medications daily as directed.  Typically will take both medications.  Still tolerating well with great improvement in symptoms.  Denies any change in appetite or sleep.  Denies racing heart.   ROS:   See pertinent positives and negatives per HPI.  Patient Active Problem List   Diagnosis Date Noted  . 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 Hypertension 03/24/2017  . History of nephrolithiasis 03/24/2017  . Hyperlipidemia 03/24/2017  . Diverticulosis 03/24/2017    Social History   Tobacco Use  . Smoking status: Former Research scientist (life sciences)  . Smokeless tobacco: Never Used  Substance Use Topics  . Alcohol use: No    Current Outpatient Medications:  .  acetaminophen (TYLENOL) 500 MG tablet, Take 500 mg by mouth every 6 (six) hours as needed for fever or headache (pain). , Disp: , Rfl:  .  amphetamine-dextroamphetamine (ADDERALL XR) 15 MG 24 hr capsule, Take one tablet (10 mg) by mouth daily on work days, Disp: 30 capsule, Rfl: 0 .  amphetamine-dextroamphetamine (ADDERALL) 15 MG tablet, Take 1 tablet by mouth daily with breakfast., Disp: 30 tablet, Rfl: 0 .  aspirin EC 81 MG tablet, Take 81 mg  by mouth at bedtime. , Disp: , Rfl:  .  atorvastatin (LIPITOR) 40 MG tablet, TAKE 1 TABLET BY MOUTH  EVERY NIGHT AT BEDTIME, Disp: 90 tablet, Rfl: 1 .  EPINEPHrine (EPIPEN 2-PAK) 0.3 mg/0.3 mL IJ SOAJ injection, Inject 0.3 mg into the muscle once as needed (severe allergic reaction)., Disp: , Rfl:  .  fluticasone (FLONASE) 50 MCG/ACT nasal spray, Place 2 sprays into both nostrils daily., Disp: 16 g, Rfl: 6 .  lisinopril (ZESTRIL) 20 MG tablet, TAKE 1 TABLET BY MOUTH  EVERY NIGHT AT BEDTIME, Disp: 90 tablet, Rfl: 1 .  Magnesium 250 MG TABS, Take 1 tablet by mouth daily., Disp: , Rfl:  .  Multiple Vitamin (MULTIVITAMIN WITH MINERALS) TABS tablet, Take 1 tablet by mouth daily., Disp: , Rfl:  .  OVER THE COUNTER MEDICATION, Apply 1 application topically daily as needed (for itchy)., Disp: , Rfl:  .  pantoprazole (PROTONIX) 40 MG tablet, TAKE 1 TABLET BY MOUTH  DAILY, Disp: 90 tablet, Rfl: 1 .  tamsulosin (FLOMAX) 0.4 MG CAPS capsule, TAKE 1 CAPSULE BY MOUTH  DAILY AFTER SUPPER, Disp: 90 capsule, Rfl: 1 .  traMADol (ULTRAM) 50 MG tablet, Take 1 tablet (50 mg total) by mouth daily as needed., Disp: 30 tablet, Rfl: 0 .  vitamin B-12 (CYANOCOBALAMIN) 100 MCG tablet, Take 1 tablet (100 mcg total) by mouth daily., Disp: 30 tablet, Rfl: 0 .  vitamin C (ASCORBIC ACID) 500 MG tablet, Take 500 mg by mouth daily., Disp: , Rfl:  .  Vitamin D, Cholecalciferol, 1000 units CAPS, Take  1 capsule by mouth daily. (Patient taking differently: Take 1,000 Units by mouth every other day. ), Disp: 60 capsule, Rfl: 0  Allergies  Allergen Reactions  . Levaquin [Levofloxacin] Nausea And Vomiting  . Strattera [Atomoxetine Hcl] Other (See Comments)    Per wife it was ineffective but she does not remember a reaction  . Shellfish Allergy Hives    Objective:   There were no vitals taken for this visit.  Patient is well-developed, well-nourished in no acute distress.  Resting comfortably at home.  Head is normocephalic,  atraumatic.  No labored breathing.  Speech is clear and coherent with logical content.  Patient is alert and oriented at baseline.   Assessment and Plan:    1. Attention-deficit hyperactivity disorder, predominantly inattentive type Patient continues to do very well on this regimen.  We will continue the same.  Controlled substance contract is up-to-date.  PDMP reviewed today.  No red flags.  Patient is due for a physical.  He has been encouraged to schedule this.   Leeanne Rio, [place] 03/04/2019

## 2019-03-04 NOTE — Progress Notes (Signed)
I have discussed the procedure for the virtual visit with the patient who has given consent to proceed with assessment and treatment.   Gary [name], CMA     

## 2019-03-23 ENCOUNTER — Encounter: Payer: Self-pay | Admitting: Physician Assistant

## 2019-03-23 ENCOUNTER — Ambulatory Visit: Payer: 59 | Admitting: Physician Assistant

## 2019-03-23 ENCOUNTER — Other Ambulatory Visit: Payer: Self-pay

## 2019-03-23 ENCOUNTER — Telehealth (INDEPENDENT_AMBULATORY_CARE_PROVIDER_SITE_OTHER): Payer: 59 | Admitting: Physician Assistant

## 2019-03-23 VITALS — BP 160/75 | HR 81

## 2019-03-23 DIAGNOSIS — J0100 Acute maxillary sinusitis, unspecified: Secondary | ICD-10-CM

## 2019-03-23 MED ORDER — AMOXICILLIN-POT CLAVULANATE 875-125 MG PO TABS: 1.0000 | ORAL_TABLET | Freq: Two times a day (BID) | ORAL | 0 refills | Status: DC

## 2019-03-23 NOTE — Progress Notes (Signed)
I have discussed the procedure for the virtual visit with the patient who has given consent to proceed with assessment and treatment.   Cordelia Bessinger S Dajanee Voorheis, CMA     

## 2019-03-23 NOTE — Progress Notes (Signed)
Virtual Visit via Video   I connected with patient on 03/23/19 at  9:00 AM EDT by a video enabled telemedicine application and verified that I am speaking with the correct person using two identifiers.  Location patient: Home Location provider: [facility], Office Persons participating in the virtual visit: Patient, Provider, [place] (Patina Moore)  I discussed the limitations of evaluation and management by telemedicine and the availability of in person appointments. The patient expressed understanding and agreed to proceed.  Subjective:   HPI:   Patient presents via Caregility today c/o a few days of L maxillary sinus pressure now with sinus pain and dental pain. Denies ear pain or R sided frontal or maxillary pressure/pain. Notes some nasal congestion but mild. Denies fever, chills, malaise. Denies pain with eating. Has only taken Excedrin for the pain which helps.  ROS:   See pertinent positives and negatives per HPI.  Patient Active Problem List   Diagnosis Date Noted  . 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 Hypertension 03/24/2017  . History of nephrolithiasis 03/24/2017  . Hyperlipidemia 03/24/2017  . Diverticulosis 03/24/2017  . Chronic kidney disease, stage 3 unspecified 05/02/2016  . Attention-deficit hyperactivity disorder, 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  . Multiple joint pain 04/02/2015  . Screening for ischemic heart disease 10/21/2005  . Encounter for screening for malignant neoplasm of prostate 10/21/2005  . GERD (gastroesophageal reflux disease) 10/21/2005  . Diverticulosis of colon 10/21/2005  . Calculus of kidney 10/21/2005  . Allergic rhinitis 10/21/2005    Social History   Tobacco Use  . Smoking status: Former Research scientist (life sciences)  . Smokeless tobacco: Never Used  Substance Use Topics  . Alcohol use: No    Current Outpatient Medications:  .  acetaminophen (TYLENOL) 500 MG tablet, Take 500 mg by mouth every 6  (six) hours as needed for fever or headache (pain). , Disp: , Rfl:  .  amphetamine-dextroamphetamine (ADDERALL XR) 15 MG 24 hr capsule, Take one tablet (10 mg) by mouth daily on work days, Disp: 30 capsule, Rfl: 0 .  amphetamine-dextroamphetamine (ADDERALL) 15 MG tablet, Take 1 tablet by mouth daily with breakfast., Disp: 30 tablet, Rfl: 0 .  aspirin EC 81 MG tablet, Take 81 mg by mouth at bedtime. , Disp: , Rfl:  .  atorvastatin (LIPITOR) 40 MG tablet, TAKE 1 TABLET BY MOUTH  EVERY NIGHT AT BEDTIME, Disp: 90 tablet, Rfl: 1 .  EPINEPHrine (EPIPEN 2-PAK) 0.3 mg/0.3 mL IJ SOAJ injection, Inject 0.3 mg into the muscle once as needed (severe allergic reaction)., Disp: , Rfl:  .  fluticasone (FLONASE) 50 MCG/ACT nasal spray, Place 2 sprays into both nostrils daily., Disp: 16 g, Rfl: 6 .  lisinopril (ZESTRIL) 20 MG tablet, TAKE 1 TABLET BY MOUTH  EVERY NIGHT AT BEDTIME, Disp: 90 tablet, Rfl: 1 .  Magnesium 250 MG TABS, Take 1 tablet by mouth daily., Disp: , Rfl:  .  Multiple Vitamin (MULTIVITAMIN WITH MINERALS) TABS tablet, Take 1 tablet by mouth daily., Disp: , Rfl:  .  OVER THE COUNTER MEDICATION, Apply 1 application topically daily as needed (for itchy)., Disp: , Rfl:  .  pantoprazole (PROTONIX) 40 MG tablet, TAKE 1 TABLET BY MOUTH  DAILY, Disp: 90 tablet, Rfl: 1 .  tamsulosin (FLOMAX) 0.4 MG CAPS capsule, TAKE 1 CAPSULE BY MOUTH  DAILY AFTER SUPPER, Disp: 90 capsule, Rfl: 1 .  traMADol (ULTRAM) 50 MG tablet, Take 1 tablet (50 mg total) by mouth daily as needed., Disp: 30 tablet, Rfl:  0 .  vitamin B-12 (CYANOCOBALAMIN) 100 MCG tablet, Take 1 tablet (100 mcg total) by mouth daily., Disp: 30 tablet, Rfl: 0 .  vitamin C (ASCORBIC ACID) 500 MG tablet, Take 500 mg by mouth daily., Disp: , Rfl:  .  Vitamin D, Cholecalciferol, 1000 units CAPS, Take 1 capsule by mouth daily. (Patient taking differently: Take 1,000 Units by mouth every other day. ), Disp: 60 capsule, Rfl: 0  Allergies  Allergen Reactions  .  Levaquin [Levofloxacin] Nausea And Vomiting  . Strattera [Atomoxetine Hcl] Other (See Comments)    Per wife it was ineffective but she does not remember a reaction  . Shellfish Allergy Hives    Objective:   BP (!) 160/75   Pulse 81   Patient is well-developed, well-nourished in no acute distress.  Resting comfortably at home.  Head is normocephalic, atraumatic.  No labored breathing.  Speech is clear and coherent with logical content.  Patient is alert and oriented at baseline.  + TTP L maxillary sinus  Assessment and Plan:   1. Acute non-recurrent maxillary sinusitis Rx Augmentin. This will also cover mild dental infection if present. Increase fluids.  Rest.  Saline nasal spray.  Probiotic.  Mucinex as directed.  Humidifier in bedroom. Continue OTC analgesics.  Call or return to clinic if symptoms are not improving.   Leeanne Rio, PA-C 03/23/2019

## 2019-03-23 NOTE — Patient Instructions (Signed)
Instructions sent to pharmacy.    Please take antibiotic as directed.  Increase fluid intake.  Use Saline nasal spray.  Take a daily multivitamin. Can continue OTC pain reliever.  Place a humidifier in the bedroom.  Please call or return clinic if symptoms are not improving -- I would also want you to reach out to your dentist to make sure no dental infection present.   Sinusitis Sinusitis is redness, soreness, and swelling (inflammation) of the paranasal sinuses. Paranasal sinuses are air pockets within the bones of your face (beneath the eyes, the middle of the forehead, or above the eyes). In healthy paranasal sinuses, mucus is able to drain out, and air is able to circulate through them by way of your nose. However, when your paranasal sinuses are inflamed, mucus and air can become trapped. This can allow bacteria and other germs to grow and cause infection. Sinusitis can develop quickly and last only a short time (acute) or continue over a long period (chronic). Sinusitis that lasts for more than 12 weeks is considered chronic.  CAUSES  Causes of sinusitis include:  Allergies.  Structural abnormalities, such as displacement of the cartilage that separates your nostrils (deviated septum), which can decrease the air flow through your nose and sinuses and affect sinus drainage.  Functional abnormalities, such as when the small hairs (cilia) that line your sinuses and help remove mucus do not work properly or are not present. SYMPTOMS  Symptoms of acute and chronic sinusitis are the same. The primary symptoms are pain and pressure around the affected sinuses. Other symptoms include:  Upper toothache.  Earache.  Headache.  Bad breath.  Decreased sense of smell and taste.  A cough, which worsens when you are lying flat.  Fatigue.  Fever.  Thick drainage from your nose, which often is green and may contain pus (purulent).  Swelling and warmth over the affected sinuses. DIAGNOSIS    Your caregiver will perform a physical exam. During the exam, your caregiver may:  Look in your nose for signs of abnormal growths in your nostrils (nasal polyps).  Tap over the affected sinus to check for signs of infection.  View the inside of your sinuses (endoscopy) with a special imaging device with a light attached (endoscope), which is inserted into your sinuses. If your caregiver suspects that you have chronic sinusitis, one or more of the following tests may be recommended:  Allergy tests.  Nasal culture A sample of mucus is taken from your nose and sent to a lab and screened for bacteria.  Nasal cytology A sample of mucus is taken from your nose and examined by your caregiver to determine if your sinusitis is related to an allergy. TREATMENT  Most cases of acute sinusitis are related to a viral infection and will resolve on their own within 10 days. Sometimes medicines are prescribed to help relieve symptoms (pain medicine, decongestants, nasal steroid sprays, or saline sprays).  However, for sinusitis related to a bacterial infection, your caregiver will prescribe antibiotic medicines. These are medicines that will help kill the bacteria causing the infection.  Rarely, sinusitis is caused by a fungal infection. In theses cases, your caregiver will prescribe antifungal medicine. For some cases of chronic sinusitis, surgery is needed. Generally, these are cases in which sinusitis recurs more than 3 times per year, despite other treatments. HOME CARE INSTRUCTIONS   Drink plenty of water. Water helps thin the mucus so your sinuses can drain more easily.  Use a humidifier.  Inhale steam 3 to 4 times a day (for example, sit in the bathroom with the shower running).  Apply a warm, moist washcloth to your face 3 to 4 times a day, or as directed by your caregiver.  Use saline nasal sprays to help moisten and clean your sinuses.  Take over-the-counter or prescription medicines for  pain, discomfort, or fever only as directed by your caregiver. SEEK IMMEDIATE MEDICAL CARE IF:  You have increasing pain or severe headaches.  You have nausea, vomiting, or drowsiness.  You have swelling around your face.  You have vision problems.  You have a stiff neck.  You have difficulty breathing. MAKE SURE YOU:   Understand these instructions.  Will watch your condition.  Will get help right away if you are not doing well or get worse. Document Released: 12/23/2004 Document Revised: 03/17/2011 Document Reviewed: 01/07/2011 [facility] Patient Information 2014 [place], [place].

## 2019-04-14 ENCOUNTER — Encounter: Payer: Self-pay | Admitting: Physician Assistant

## 2019-04-14 DIAGNOSIS — F909 Attention-deficit hyperactivity disorder, unspecified type: Secondary | ICD-10-CM

## 2019-04-17 MED ORDER — AMPHETAMINE-DEXTROAMPHETAMINE 15 MG PO TABS: 15.0000 mg | ORAL_TABLET | Freq: Every day | ORAL | 0 refills | Status: DC

## 2019-04-17 MED ORDER — AMPHETAMINE-DEXTROAMPHET ER 15 MG PO CP24: ORAL_CAPSULE | ORAL | 0 refills | Status: DC

## 2019-05-16 ENCOUNTER — Encounter: Payer: Self-pay | Admitting: Physician Assistant

## 2019-05-16 DIAGNOSIS — F909 Attention-deficit hyperactivity disorder, unspecified type: Secondary | ICD-10-CM

## 2019-05-16 MED ORDER — AMPHETAMINE-DEXTROAMPHETAMINE 15 MG PO TABS: 15.0000 mg | ORAL_TABLET | Freq: Every day | ORAL | 0 refills | Status: DC

## 2019-05-16 MED ORDER — AMPHETAMINE-DEXTROAMPHET ER 15 MG PO CP24: ORAL_CAPSULE | ORAL | 0 refills | Status: DC

## 2019-06-16 ENCOUNTER — Other Ambulatory Visit: Payer: Self-pay

## 2019-06-16 ENCOUNTER — Encounter: Payer: Self-pay | Admitting: Physician Assistant

## 2019-06-16 DIAGNOSIS — F909 Attention-deficit hyperactivity disorder, unspecified type: Secondary | ICD-10-CM

## 2019-06-16 NOTE — Telephone Encounter (Signed)
LFD 05/16/19 LOV 03/23/19 NOV none

## 2019-06-17 MED ORDER — AMPHETAMINE-DEXTROAMPHET ER 15 MG PO CP24: ORAL_CAPSULE | ORAL | 0 refills | Status: DC

## 2019-06-17 MED ORDER — AMPHETAMINE-DEXTROAMPHETAMINE 15 MG PO TABS: 15.0000 mg | ORAL_TABLET | Freq: Every day | ORAL | 0 refills | Status: DC

## 2019-07-13 ENCOUNTER — Encounter: Payer: Self-pay | Admitting: Physician Assistant

## 2019-07-13 DIAGNOSIS — F909 Attention-deficit hyperactivity disorder, unspecified type: Secondary | ICD-10-CM

## 2019-07-13 MED ORDER — AMPHETAMINE-DEXTROAMPHET ER 15 MG PO CP24: ORAL_CAPSULE | ORAL | 0 refills | Status: DC

## 2019-07-13 MED ORDER — AMPHETAMINE-DEXTROAMPHETAMINE 15 MG PO TABS: 15.0000 mg | ORAL_TABLET | Freq: Every day | ORAL | 0 refills | Status: DC

## 2019-07-21 ENCOUNTER — Inpatient Hospital Stay (HOSPITAL_COMMUNITY)
Admission: EM | Admit: 2019-07-21 | Discharge: 2019-08-11 | DRG: 981 | Disposition: A | Discharge status: Rehabilitation Facility | Payer: 59 | Attending: Emergency Medicine | Admitting: Emergency Medicine

## 2019-07-21 ENCOUNTER — Emergency Department (HOSPITAL_COMMUNITY): Payer: 59

## 2019-07-21 DIAGNOSIS — S02841A Fracture of lateral orbital wall, right side, initial encounter for closed fracture: Secondary | ICD-10-CM | POA: Diagnosis not present

## 2019-07-21 DIAGNOSIS — S062X9A Diffuse traumatic brain injury with loss of consciousness of unspecified duration, initial encounter: Secondary | ICD-10-CM | POA: Diagnosis present

## 2019-07-21 DIAGNOSIS — J9690 Respiratory failure, unspecified, unspecified whether with hypoxia or hypercapnia: Secondary | ICD-10-CM

## 2019-07-21 DIAGNOSIS — R000 Tachycardia, unspecified: Secondary | ICD-10-CM | POA: Diagnosis not present

## 2019-07-21 DIAGNOSIS — F909 Attention-deficit hyperactivity disorder, unspecified type: Secondary | ICD-10-CM | POA: Diagnosis present

## 2019-07-21 DIAGNOSIS — Z91013 Allergy to seafood: Secondary | ICD-10-CM

## 2019-07-21 DIAGNOSIS — Z79899 Other long term (current) drug therapy: Secondary | ICD-10-CM

## 2019-07-21 DIAGNOSIS — R638 Other symptoms and signs concerning food and fluid intake: Secondary | ICD-10-CM

## 2019-07-21 DIAGNOSIS — S069X9S Unspecified intracranial injury with loss of consciousness of unspecified duration, sequela: Secondary | ICD-10-CM

## 2019-07-21 DIAGNOSIS — S0240DA Maxillary fracture, left side, initial encounter for closed fracture: Secondary | ICD-10-CM | POA: Diagnosis present

## 2019-07-21 DIAGNOSIS — J9601 Acute respiratory failure with hypoxia: Secondary | ICD-10-CM | POA: Diagnosis present

## 2019-07-21 DIAGNOSIS — Z515 Encounter for palliative care: Secondary | ICD-10-CM

## 2019-07-21 DIAGNOSIS — R509 Fever, unspecified: Secondary | ICD-10-CM | POA: Diagnosis not present

## 2019-07-21 DIAGNOSIS — S065X9A Traumatic subdural hemorrhage with loss of consciousness of unspecified duration, initial encounter: Secondary | ICD-10-CM | POA: Diagnosis present

## 2019-07-21 DIAGNOSIS — Z79891 Long term (current) use of opiate analgesic: Secondary | ICD-10-CM

## 2019-07-21 DIAGNOSIS — Z87891 Personal history of nicotine dependence: Secondary | ICD-10-CM

## 2019-07-21 DIAGNOSIS — D181 Lymphangioma, any site: Secondary | ICD-10-CM | POA: Diagnosis present

## 2019-07-21 DIAGNOSIS — Z8249 Family history of ischemic heart disease and other diseases of the circulatory system: Secondary | ICD-10-CM

## 2019-07-21 DIAGNOSIS — Z823 Family history of stroke: Secondary | ICD-10-CM

## 2019-07-21 DIAGNOSIS — Z4659 Encounter for fitting and adjustment of other gastrointestinal appliance and device: Secondary | ICD-10-CM

## 2019-07-21 DIAGNOSIS — R1310 Dysphagia, unspecified: Secondary | ICD-10-CM | POA: Diagnosis present

## 2019-07-21 DIAGNOSIS — K117 Disturbances of salivary secretion: Secondary | ICD-10-CM

## 2019-07-21 DIAGNOSIS — E870 Hyperosmolality and hypernatremia: Secondary | ICD-10-CM | POA: Diagnosis present

## 2019-07-21 DIAGNOSIS — G9608 Other cranial cerebrospinal fluid leak: Secondary | ICD-10-CM | POA: Diagnosis not present

## 2019-07-21 DIAGNOSIS — T1490XA Injury, unspecified, initial encounter: Secondary | ICD-10-CM | POA: Diagnosis not present

## 2019-07-21 DIAGNOSIS — D649 Anemia, unspecified: Secondary | ICD-10-CM | POA: Diagnosis present

## 2019-07-21 DIAGNOSIS — R55 Syncope and collapse: Secondary | ICD-10-CM | POA: Diagnosis present

## 2019-07-21 DIAGNOSIS — N289 Disorder of kidney and ureter, unspecified: Secondary | ICD-10-CM | POA: Diagnosis present

## 2019-07-21 DIAGNOSIS — R0689 Other abnormalities of breathing: Secondary | ICD-10-CM

## 2019-07-21 DIAGNOSIS — R451 Restlessness and agitation: Secondary | ICD-10-CM | POA: Diagnosis not present

## 2019-07-21 DIAGNOSIS — R531 Weakness: Secondary | ICD-10-CM

## 2019-07-21 DIAGNOSIS — R05 Cough: Secondary | ICD-10-CM | POA: Diagnosis not present

## 2019-07-21 DIAGNOSIS — Z7982 Long term (current) use of aspirin: Secondary | ICD-10-CM

## 2019-07-21 DIAGNOSIS — R4689 Other symptoms and signs involving appearance and behavior: Secondary | ICD-10-CM

## 2019-07-21 DIAGNOSIS — E876 Hypokalemia: Secondary | ICD-10-CM | POA: Diagnosis present

## 2019-07-21 DIAGNOSIS — R197 Diarrhea, unspecified: Secondary | ICD-10-CM | POA: Diagnosis not present

## 2019-07-21 DIAGNOSIS — N179 Acute kidney failure, unspecified: Secondary | ICD-10-CM | POA: Diagnosis present

## 2019-07-21 DIAGNOSIS — K219 Gastro-esophageal reflux disease without esophagitis: Secondary | ICD-10-CM | POA: Diagnosis present

## 2019-07-21 DIAGNOSIS — S02402A Zygomatic fracture, unspecified, initial encounter for closed fracture: Secondary | ICD-10-CM | POA: Diagnosis present

## 2019-07-21 DIAGNOSIS — E872 Acidosis: Secondary | ICD-10-CM | POA: Diagnosis present

## 2019-07-21 DIAGNOSIS — Z978 Presence of other specified devices: Secondary | ICD-10-CM

## 2019-07-21 DIAGNOSIS — S02609A Fracture of mandible, unspecified, initial encounter for closed fracture: Secondary | ICD-10-CM | POA: Diagnosis present

## 2019-07-21 DIAGNOSIS — I10 Essential (primary) hypertension: Secondary | ICD-10-CM | POA: Diagnosis present

## 2019-07-21 DIAGNOSIS — Y92149 Unspecified place in prison as the place of occurrence of the external cause: Secondary | ICD-10-CM

## 2019-07-21 DIAGNOSIS — S0240CA Maxillary fracture, right side, initial encounter for closed fracture: Secondary | ICD-10-CM | POA: Diagnosis present

## 2019-07-21 DIAGNOSIS — Z9911 Dependence on respirator [ventilator] status: Secondary | ICD-10-CM

## 2019-07-21 DIAGNOSIS — R609 Edema, unspecified: Secondary | ICD-10-CM | POA: Diagnosis not present

## 2019-07-21 DIAGNOSIS — E785 Hyperlipidemia, unspecified: Secondary | ICD-10-CM | POA: Diagnosis present

## 2019-07-21 DIAGNOSIS — Z20822 Contact with and (suspected) exposure to covid-19: Secondary | ICD-10-CM | POA: Diagnosis present

## 2019-07-21 DIAGNOSIS — R5383 Other fatigue: Secondary | ICD-10-CM | POA: Diagnosis not present

## 2019-07-21 HISTORY — DX: Attention-deficit hyperactivity disorder, unspecified type: F90.9

## 2019-07-21 HISTORY — DX: Gastro-esophageal reflux disease with esophagitis, without bleeding: K21.00

## 2019-07-21 HISTORY — DX: Chronic kidney disease, unspecified: N18.9

## 2019-07-21 HISTORY — DX: Essential (primary) hypertension: I10

## 2019-07-21 LAB — I-STAT CHEM 8, ED
BUN: 20 mg/dL (ref 8–23)
Calcium, Ion: 1.07 mmol/L — ABNORMAL LOW (ref 1.15–1.40)
Chloride: 110 mmol/L (ref 98–111)
Creatinine, Ser: 1.5 mg/dL — ABNORMAL HIGH (ref 0.61–1.24)
Glucose, Bld: 125 mg/dL — ABNORMAL HIGH (ref 70–99)
HCT: 41 % (ref 39.0–52.0)
Hemoglobin: 13.9 g/dL (ref 13.0–17.0)
Potassium: 3.4 mmol/L — ABNORMAL LOW (ref 3.5–5.1)
Sodium: 145 mmol/L (ref 135–145)
TCO2: 18 mmol/L — ABNORMAL LOW (ref 22–32)

## 2019-07-21 LAB — PROTIME-INR
INR: 1.1 (ref 0.8–1.2)
Prothrombin Time: 13.7 seconds (ref 11.4–15.2)

## 2019-07-21 LAB — ETHANOL: Alcohol, Ethyl (B): <10 mg/dL (ref <10)

## 2019-07-21 LAB — SAMPLE TO BLOOD BANK

## 2019-07-21 MED ORDER — IOHEXOL 300 MG/ML  SOLN
100.0000 mL | Freq: Once | INTRAMUSCULAR | Status: AC | PRN
  Administered 2019-07-21: 100 mL via INTRAVENOUS

## 2019-07-21 MED ORDER — FENTANYL 2500MCG IN NS 250ML (10MCG/ML) PREMIX INFUSION
0.0000 ug/h | INTRAVENOUS | Status: DC
  Administered 2019-07-22: 150 ug/h via INTRAVENOUS
  Filled 2019-07-21: qty 250

## 2019-07-21 MED ORDER — FENTANYL CITRATE (PF) 100 MCG/2ML IJ SOLN
50.0000 ug | Freq: Once | INTRAMUSCULAR | Status: AC
  Administered 2019-07-22: 50 ug via INTRAVENOUS

## 2019-07-21 NOTE — ED Notes (Signed)
[place]
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
## 2019-07-22 ENCOUNTER — Inpatient Hospital Stay (HOSPITAL_COMMUNITY): Payer: 59

## 2019-07-22 ENCOUNTER — Other Ambulatory Visit: Payer: Self-pay

## 2019-07-22 ENCOUNTER — Encounter (HOSPITAL_COMMUNITY): Payer: Self-pay

## 2019-07-22 DIAGNOSIS — Z7189 Other specified counseling: Secondary | ICD-10-CM | POA: Diagnosis not present

## 2019-07-22 DIAGNOSIS — S065X9A Traumatic subdural hemorrhage with loss of consciousness of unspecified duration, initial encounter: Secondary | ICD-10-CM | POA: Diagnosis not present

## 2019-07-22 DIAGNOSIS — S069X3D Unspecified intracranial injury with loss of consciousness of 1 hour to 5 hours 59 minutes, subsequent encounter: Secondary | ICD-10-CM | POA: Diagnosis not present

## 2019-07-22 DIAGNOSIS — R1310 Dysphagia, unspecified: Secondary | ICD-10-CM | POA: Diagnosis present

## 2019-07-22 DIAGNOSIS — S0240CA Maxillary fracture, right side, initial encounter for closed fracture: Secondary | ICD-10-CM | POA: Diagnosis not present

## 2019-07-22 DIAGNOSIS — D181 Lymphangioma, any site: Secondary | ICD-10-CM | POA: Diagnosis present

## 2019-07-22 DIAGNOSIS — S02609A Fracture of mandible, unspecified, initial encounter for closed fracture: Secondary | ICD-10-CM | POA: Diagnosis not present

## 2019-07-22 DIAGNOSIS — S069X3S Unspecified intracranial injury with loss of consciousness of 1 hour to 5 hours 59 minutes, sequela: Secondary | ICD-10-CM | POA: Diagnosis not present

## 2019-07-22 DIAGNOSIS — E870 Hyperosmolality and hypernatremia: Secondary | ICD-10-CM | POA: Diagnosis not present

## 2019-07-22 DIAGNOSIS — H532 Diplopia: Secondary | ICD-10-CM | POA: Diagnosis not present

## 2019-07-22 DIAGNOSIS — R509 Fever, unspecified: Secondary | ICD-10-CM | POA: Diagnosis not present

## 2019-07-22 DIAGNOSIS — E876 Hypokalemia: Secondary | ICD-10-CM | POA: Diagnosis present

## 2019-07-22 DIAGNOSIS — F909 Attention-deficit hyperactivity disorder, unspecified type: Secondary | ICD-10-CM | POA: Diagnosis present

## 2019-07-22 DIAGNOSIS — S0240DA Maxillary fracture, left side, initial encounter for closed fracture: Secondary | ICD-10-CM | POA: Diagnosis not present

## 2019-07-22 DIAGNOSIS — S069X0D Unspecified intracranial injury without loss of consciousness, subsequent encounter: Secondary | ICD-10-CM | POA: Diagnosis not present

## 2019-07-22 DIAGNOSIS — R531 Weakness: Secondary | ICD-10-CM | POA: Diagnosis not present

## 2019-07-22 DIAGNOSIS — J9601 Acute respiratory failure with hypoxia: Secondary | ICD-10-CM | POA: Diagnosis not present

## 2019-07-22 DIAGNOSIS — G478 Other sleep disorders: Secondary | ICD-10-CM | POA: Diagnosis not present

## 2019-07-22 DIAGNOSIS — Y92149 Unspecified place in prison as the place of occurrence of the external cause: Secondary | ICD-10-CM | POA: Diagnosis not present

## 2019-07-22 DIAGNOSIS — E871 Hypo-osmolality and hyponatremia: Secondary | ICD-10-CM | POA: Diagnosis not present

## 2019-07-22 DIAGNOSIS — Z9911 Dependence on respirator [ventilator] status: Secondary | ICD-10-CM | POA: Diagnosis not present

## 2019-07-22 DIAGNOSIS — Z515 Encounter for palliative care: Secondary | ICD-10-CM | POA: Diagnosis not present

## 2019-07-22 DIAGNOSIS — Z20822 Contact with and (suspected) exposure to covid-19: Secondary | ICD-10-CM | POA: Diagnosis not present

## 2019-07-22 DIAGNOSIS — R638 Other symptoms and signs concerning food and fluid intake: Secondary | ICD-10-CM | POA: Diagnosis not present

## 2019-07-22 DIAGNOSIS — T1490XA Injury, unspecified, initial encounter: Secondary | ICD-10-CM | POA: Diagnosis present

## 2019-07-22 DIAGNOSIS — E872 Acidosis: Secondary | ICD-10-CM | POA: Diagnosis not present

## 2019-07-22 DIAGNOSIS — S062X9A Diffuse traumatic brain injury with loss of consciousness of unspecified duration, initial encounter: Secondary | ICD-10-CM | POA: Diagnosis present

## 2019-07-22 DIAGNOSIS — K219 Gastro-esophageal reflux disease without esophagitis: Secondary | ICD-10-CM | POA: Diagnosis present

## 2019-07-22 DIAGNOSIS — S065X9D Traumatic subdural hemorrhage with loss of consciousness of unspecified duration, subsequent encounter: Secondary | ICD-10-CM | POA: Diagnosis not present

## 2019-07-22 DIAGNOSIS — S069X3A Unspecified intracranial injury with loss of consciousness of 1 hour to 5 hours 59 minutes, initial encounter: Secondary | ICD-10-CM | POA: Diagnosis not present

## 2019-07-22 DIAGNOSIS — E785 Hyperlipidemia, unspecified: Secondary | ICD-10-CM | POA: Diagnosis present

## 2019-07-22 DIAGNOSIS — R55 Syncope and collapse: Secondary | ICD-10-CM | POA: Diagnosis present

## 2019-07-22 DIAGNOSIS — R41840 Attention and concentration deficit: Secondary | ICD-10-CM | POA: Diagnosis not present

## 2019-07-22 DIAGNOSIS — S069X9S Unspecified intracranial injury with loss of consciousness of unspecified duration, sequela: Secondary | ICD-10-CM | POA: Diagnosis not present

## 2019-07-22 DIAGNOSIS — S069X4D Unspecified intracranial injury with loss of consciousness of 6 hours to 24 hours, subsequent encounter: Secondary | ICD-10-CM | POA: Diagnosis not present

## 2019-07-22 DIAGNOSIS — N179 Acute kidney failure, unspecified: Secondary | ICD-10-CM | POA: Diagnosis not present

## 2019-07-22 DIAGNOSIS — G9608 Other cranial cerebrospinal fluid leak: Secondary | ICD-10-CM | POA: Diagnosis not present

## 2019-07-22 DIAGNOSIS — R1312 Dysphagia, oropharyngeal phase: Secondary | ICD-10-CM | POA: Diagnosis not present

## 2019-07-22 DIAGNOSIS — S02402A Zygomatic fracture, unspecified, initial encounter for closed fracture: Secondary | ICD-10-CM | POA: Diagnosis present

## 2019-07-22 DIAGNOSIS — I10 Essential (primary) hypertension: Secondary | ICD-10-CM | POA: Diagnosis present

## 2019-07-22 DIAGNOSIS — R4182 Altered mental status, unspecified: Secondary | ICD-10-CM | POA: Diagnosis not present

## 2019-07-22 DIAGNOSIS — S02841A Fracture of lateral orbital wall, right side, initial encounter for closed fracture: Secondary | ICD-10-CM | POA: Diagnosis not present

## 2019-07-22 LAB — I-STAT ARTERIAL BLOOD GAS, ED
Acid-Base Excess: 1 mmol/L (ref 0.0–2.0)
Bicarbonate: 24.7 mmol/L (ref 20.0–28.0)
Calcium, Ion: 1.16 mmol/L (ref 1.15–1.40)
HCT: 38 % — ABNORMAL LOW (ref 39.0–52.0)
Hemoglobin: 12.9 g/dL — ABNORMAL LOW (ref 13.0–17.0)
O2 Saturation: 100 %
Patient temperature: 97.8
Potassium: 3.6 mmol/L (ref 3.5–5.1)
Sodium: 142 mmol/L (ref 135–145)
TCO2: 26 mmol/L (ref 22–32)
pCO2 arterial: 35.6 mmHg (ref 32.0–48.0)
pH, Arterial: 7.447 (ref 7.350–7.450)
pO2, Arterial: 498 mmHg — ABNORMAL HIGH (ref 83.0–108.0)

## 2019-07-22 LAB — COMPREHENSIVE METABOLIC PANEL
ALT: 20 U/L (ref 0–44)
AST: 31 U/L (ref 15–41)
Albumin: 3.6 g/dL (ref 3.5–5.0)
Alkaline Phosphatase: 80 U/L (ref 38–126)
Anion gap: 13 (ref 5–15)
BUN: 20 mg/dL (ref 8–23)
CO2: 17 mmol/L — ABNORMAL LOW (ref 22–32)
Calcium: 8.8 mg/dL — ABNORMAL LOW (ref 8.9–10.3)
Chloride: 111 mmol/L (ref 98–111)
Creatinine, Ser: 1.59 mg/dL — ABNORMAL HIGH (ref 0.61–1.24)
GFR calc Af Amer: 50 mL/min — ABNORMAL LOW (ref >60)
GFR calc non Af Amer: 43 mL/min — ABNORMAL LOW (ref >60)
Glucose, Bld: 127 mg/dL — ABNORMAL HIGH (ref 70–99)
Potassium: 3.3 mmol/L — ABNORMAL LOW (ref 3.5–5.1)
Sodium: 141 mmol/L (ref 135–145)
Total Bilirubin: 0.7 mg/dL (ref 0.3–1.2)
Total Protein: 6.6 g/dL (ref 6.5–8.1)

## 2019-07-22 LAB — URINALYSIS, ROUTINE W REFLEX MICROSCOPIC
Bacteria, UA: NONE SEEN
Bilirubin Urine: NEGATIVE
Glucose, UA: NEGATIVE mg/dL
Ketones, ur: NEGATIVE mg/dL
Leukocytes,Ua: NEGATIVE
Nitrite: NEGATIVE
Protein, ur: NEGATIVE mg/dL
RBC / HPF: >50 RBC/hpf — ABNORMAL HIGH (ref 0–5)
Specific Gravity, Urine: 1.042 — ABNORMAL HIGH (ref 1.005–1.030)
pH: 5 (ref 5.0–8.0)

## 2019-07-22 LAB — BASIC METABOLIC PANEL
Anion gap: 9 (ref 5–15)
BUN: 18 mg/dL (ref 8–23)
CO2: 21 mmol/L — ABNORMAL LOW (ref 22–32)
Calcium: 8.7 mg/dL — ABNORMAL LOW (ref 8.9–10.3)
Chloride: 110 mmol/L (ref 98–111)
Creatinine, Ser: 1.53 mg/dL — ABNORMAL HIGH (ref 0.61–1.24)
GFR calc Af Amer: 52 mL/min — ABNORMAL LOW (ref >60)
GFR calc non Af Amer: 45 mL/min — ABNORMAL LOW (ref >60)
Glucose, Bld: 117 mg/dL — ABNORMAL HIGH (ref 70–99)
Potassium: 3.4 mmol/L — ABNORMAL LOW (ref 3.5–5.1)
Sodium: 140 mmol/L (ref 135–145)

## 2019-07-22 LAB — GLUCOSE, CAPILLARY
Glucose-Capillary: 99 mg/dL (ref 70–99)
Glucose-Capillary: 87 mg/dL (ref 70–99)
Glucose-Capillary: 80 mg/dL (ref 70–99)
Glucose-Capillary: 68 mg/dL — ABNORMAL LOW (ref 70–99)
Glucose-Capillary: 67 mg/dL — ABNORMAL LOW (ref 70–99)
Glucose-Capillary: 91 mg/dL (ref 70–99)
Glucose-Capillary: 115 mg/dL — ABNORMAL HIGH (ref 70–99)
Glucose-Capillary: 59 mg/dL — ABNORMAL LOW (ref 70–99)
Glucose-Capillary: 80 mg/dL (ref 70–99)

## 2019-07-22 LAB — CBC
HCT: 42.8 % (ref 39.0–52.0)
HCT: 39 % (ref 39.0–52.0)
Hemoglobin: 14.1 g/dL (ref 13.0–17.0)
Hemoglobin: 13.1 g/dL (ref 13.0–17.0)
MCH: 30.3 pg (ref 26.0–34.0)
MCH: 30.3 pg (ref 26.0–34.0)
MCHC: 32.9 g/dL (ref 30.0–36.0)
MCHC: 33.6 g/dL (ref 30.0–36.0)
MCV: 91.8 fL (ref 80.0–100.0)
MCV: 90.1 fL (ref 80.0–100.0)
Platelets: 359 10*3/uL (ref 150–400)
Platelets: 322 10*3/uL (ref 150–400)
RBC: 4.66 MIL/uL (ref 4.22–5.81)
RBC: 4.33 MIL/uL (ref 4.22–5.81)
RDW: 13.3 % (ref 11.5–15.5)
RDW: 13.2 % (ref 11.5–15.5)
WBC: 5.4 10*3/uL (ref 4.0–10.5)
WBC: 9.7 10*3/uL (ref 4.0–10.5)
nRBC: 0 % (ref 0.0–0.2)
nRBC: 0 % (ref 0.0–0.2)

## 2019-07-22 LAB — TRIGLYCERIDES: Triglycerides: 181 mg/dL — ABNORMAL HIGH (ref <150)

## 2019-07-22 LAB — CDS SEROLOGY

## 2019-07-22 LAB — MRSA PCR SCREENING: MRSA by PCR: NEGATIVE

## 2019-07-22 LAB — LACTIC ACID, PLASMA: Lactic Acid, Venous: 6 mmol/L (ref 0.5–1.9)

## 2019-07-22 LAB — SARS CORONAVIRUS 2 BY RT PCR (HOSPITAL ORDER, PERFORMED IN ~~LOC~~ HOSPITAL LAB): SARS Coronavirus 2: NEGATIVE

## 2019-07-22 MED ORDER — PROCHLORPERAZINE EDISYLATE 10 MG/2ML IJ SOLN
5.0000 mg | Freq: Four times a day (QID) | INTRAMUSCULAR | Status: DC | PRN
  Filled 2019-07-22: qty 2

## 2019-07-22 MED ORDER — DOCUSATE SODIUM 50 MG/5ML PO LIQD
100.0000 mg | Freq: Two times a day (BID) | ORAL | Status: DC
  Administered 2019-07-22 – 2019-07-26 (×6): 100 mg via ORAL
  Filled 2019-07-22 (×8): qty 10

## 2019-07-22 MED ORDER — SODIUM CHLORIDE 0.9 % IV SOLN: INTRAVENOUS | Status: DC

## 2019-07-22 MED ORDER — DEXTROSE 50 % IV SOLN
12.5000 g | Freq: Once | INTRAVENOUS | Status: AC
  Administered 2019-07-22: 12.5 g via INTRAVENOUS

## 2019-07-22 MED ORDER — FENTANYL 2500MCG IN NS 250ML (10MCG/ML) PREMIX INFUSION
25.0000 ug/h | INTRAVENOUS | Status: DC
  Administered 2019-07-22: 150 ug/h via INTRAVENOUS
  Administered 2019-07-23 – 2019-07-27 (×7): 200 ug/h via INTRAVENOUS
  Administered 2019-07-28: 25 ug/h via INTRAVENOUS
  Administered 2019-07-29: 50 ug/h via INTRAVENOUS
  Administered 2019-07-30: 100 ug/h via INTRAVENOUS
  Administered 2019-08-01: 40 ug/h via INTRAVENOUS
  Filled 2019-07-22: qty 500
  Filled 2019-07-22 (×13): qty 250

## 2019-07-22 MED ORDER — PROCHLORPERAZINE MALEATE 10 MG PO TABS
10.0000 mg | ORAL_TABLET | Freq: Four times a day (QID) | ORAL | Status: DC | PRN
  Filled 2019-07-22: qty 1

## 2019-07-22 MED ORDER — DEXTROSE 50 % IV SOLN
INTRAVENOUS | Status: AC
  Administered 2019-07-22: 25 mL
  Filled 2019-07-22: qty 50

## 2019-07-22 MED ORDER — PROPOFOL 1000 MG/100ML IV EMUL
INTRAVENOUS | Status: AC | PRN
  Administered 2019-07-21: 20 ug/kg/min via INTRAVENOUS

## 2019-07-22 MED ORDER — FENTANYL BOLUS VIA INFUSION
25.0000 ug | INTRAVENOUS | Status: DC | PRN
  Filled 2019-07-22: qty 25

## 2019-07-22 MED ORDER — PROPOFOL 1000 MG/100ML IV EMUL
5.0000 ug/kg/min | INTRAVENOUS | Status: DC
  Administered 2019-07-22: 50 ug/kg/min via INTRAVENOUS
  Administered 2019-07-22: 70 ug/kg/min via INTRAVENOUS
  Administered 2019-07-22: 20 ug/kg/min via INTRAVENOUS
  Administered 2019-07-22 (×2): 70 ug/kg/min via INTRAVENOUS
  Administered 2019-07-23 (×2): 15 ug/kg/min via INTRAVENOUS
  Administered 2019-07-25: 40 ug/kg/min via INTRAVENOUS
  Administered 2019-07-25 – 2019-07-26 (×4): 35 ug/kg/min via INTRAVENOUS
  Administered 2019-07-26: 30 ug/kg/min via INTRAVENOUS
  Administered 2019-07-27: 5 ug/kg/min via INTRAVENOUS
  Administered 2019-07-27: 35 ug/kg/min via INTRAVENOUS
  Administered 2019-07-27: 30 ug/kg/min via INTRAVENOUS
  Administered 2019-07-28 – 2019-07-29 (×2): 5 ug/kg/min via INTRAVENOUS
  Administered 2019-07-30: 20 ug/kg/min via INTRAVENOUS
  Administered 2019-07-30 – 2019-07-31 (×2): 15 ug/kg/min via INTRAVENOUS
  Administered 2019-07-31 (×3): 25 ug/kg/min via INTRAVENOUS
  Administered 2019-08-01: 15 ug/kg/min via INTRAVENOUS
  Filled 2019-07-22: qty 200
  Filled 2019-07-22 (×24): qty 100

## 2019-07-22 MED ORDER — ETOMIDATE 2 MG/ML IV SOLN
INTRAVENOUS | Status: AC | PRN
  Administered 2019-07-21: 20 mg via INTRAVENOUS

## 2019-07-22 MED ORDER — FENTANYL CITRATE (PF) 100 MCG/2ML IJ SOLN: 25.0000 ug | Freq: Once | INTRAMUSCULAR | Status: DC

## 2019-07-22 MED ORDER — ONDANSETRON HCL 4 MG/2ML IJ SOLN: 4.0000 mg | Freq: Four times a day (QID) | INTRAMUSCULAR | Status: DC | PRN

## 2019-07-22 MED ORDER — DOCUSATE SODIUM 100 MG PO CAPS: 100.0000 mg | ORAL_CAPSULE | Freq: Two times a day (BID) | ORAL | Status: DC

## 2019-07-22 MED ORDER — ROCURONIUM BROMIDE 50 MG/5ML IV SOLN
INTRAVENOUS | Status: AC | PRN
  Administered 2019-07-21: 80 mg via INTRAVENOUS

## 2019-07-22 MED ORDER — DEXTROSE 50 % IV SOLN
INTRAVENOUS | Status: AC
  Administered 2019-07-22: 50 mL
  Filled 2019-07-22: qty 50

## 2019-07-22 MED ORDER — ONDANSETRON 4 MG PO TBDP
4.0000 mg | ORAL_TABLET | Freq: Four times a day (QID) | ORAL | Status: DC | PRN
  Filled 2019-07-22: qty 1

## 2019-07-22 MED ORDER — PROPOFOL 1000 MG/100ML IV EMUL
INTRAVENOUS | Status: AC
  Filled 2019-07-22: qty 100

## 2019-07-22 MED ORDER — KCL IN DEXTROSE-NACL 20-5-0.45 MEQ/L-%-% IV SOLN
INTRAVENOUS | Status: DC
  Filled 2019-07-22: qty 1000

## 2019-07-22 MED ORDER — ALBUMIN HUMAN 5 % IV SOLN
25.0000 g | Freq: Once | INTRAVENOUS | Status: AC
  Administered 2019-07-22: 25 g via INTRAVENOUS
  Filled 2019-07-22: qty 500

## 2019-07-22 MED ORDER — POLYETHYLENE GLYCOL 3350 17 G PO PACK
17.0000 g | PACK | Freq: Every day | ORAL | Status: DC
  Administered 2019-07-26 – 2019-08-07 (×9): 17 g via ORAL
  Filled 2019-07-22 (×10): qty 1

## 2019-07-22 MED ORDER — POTASSIUM CHLORIDE 2 MEQ/ML IV SOLN: INTRAVENOUS | Status: DC

## 2019-07-22 MED ORDER — CHLORHEXIDINE GLUCONATE CLOTH 2 % EX PADS
6.0000 | MEDICATED_PAD | Freq: Every day | CUTANEOUS | Status: DC
  Administered 2019-07-22 – 2019-08-11 (×20): 6 via TOPICAL

## 2019-07-22 MED ORDER — ACETAMINOPHEN 325 MG PO TABS
650.0000 mg | ORAL_TABLET | ORAL | Status: DC | PRN
  Administered 2019-07-26: 650 mg via ORAL
  Filled 2019-07-22: qty 2

## 2019-07-22 MED ORDER — ORAL CARE MOUTH RINSE
15.0000 mL | OROMUCOSAL | Status: DC
  Administered 2019-07-22 – 2019-08-01 (×102): 15 mL via OROMUCOSAL

## 2019-07-22 MED ORDER — CHLORHEXIDINE GLUCONATE 0.12% ORAL RINSE (MEDLINE KIT)
15.0000 mL | Freq: Two times a day (BID) | OROMUCOSAL | Status: DC
  Administered 2019-07-22 – 2019-08-11 (×39): 15 mL via OROMUCOSAL

## 2019-07-22 MED ORDER — DEXMEDETOMIDINE HCL IN NACL 400 MCG/100ML IV SOLN
0.4000 ug/kg/h | INTRAVENOUS | Status: DC
  Administered 2019-07-22: 0.4 ug/kg/h via INTRAVENOUS
  Administered 2019-07-23: 1.2 ug/kg/h via INTRAVENOUS
  Administered 2019-07-23: 0.5 ug/kg/h via INTRAVENOUS
  Administered 2019-07-23: 0.6 ug/kg/h via INTRAVENOUS
  Administered 2019-07-24 (×2): 1 ug/kg/h via INTRAVENOUS
  Administered 2019-07-25 (×3): 1.2 ug/kg/h via INTRAVENOUS
  Administered 2019-07-26 – 2019-07-27 (×4): 0.5 ug/kg/h via INTRAVENOUS
  Administered 2019-08-01: 0.4 ug/kg/h via INTRAVENOUS
  Filled 2019-07-22 (×7): qty 100
  Filled 2019-07-22: qty 200
  Filled 2019-07-22: qty 100
  Filled 2019-07-22: qty 200
  Filled 2019-07-22 (×5): qty 100

## 2019-07-22 MED ORDER — SODIUM CHLORIDE 0.9 % IV SOLN
INTRAVENOUS | Status: AC | PRN
  Administered 2019-07-21: 1000 mL via INTRAVENOUS

## 2019-07-22 MED ORDER — ENOXAPARIN SODIUM 30 MG/0.3ML ~~LOC~~ SOLN
30.0000 mg | Freq: Two times a day (BID) | SUBCUTANEOUS | Status: DC
  Administered 2019-07-22 – 2019-08-11 (×41): 30 mg via SUBCUTANEOUS
  Filled 2019-07-22 (×41): qty 0.3

## 2019-07-22 MED ORDER — KCL IN DEXTROSE-NACL 20-5-0.9 MEQ/L-%-% IV SOLN
INTRAVENOUS | Status: DC
  Filled 2019-07-22 (×9): qty 1000

## 2019-07-22 MED ORDER — HYDRALAZINE HCL 25 MG PO TABS
25.0000 mg | ORAL_TABLET | Freq: Four times a day (QID) | ORAL | Status: DC | PRN
  Administered 2019-07-24: 25 mg via ORAL
  Filled 2019-07-22 (×2): qty 1

## 2019-07-22 MED ORDER — POTASSIUM CHLORIDE IN NACL 20-0.9 MEQ/L-% IV SOLN
INTRAVENOUS | Status: DC
  Filled 2019-07-22 (×2): qty 1000

## 2019-07-22 NOTE — Plan of Care (Signed)
  Problem: Clinical Measurements: Goal: Will remain free from infection Outcome: Progressing Goal: Diagnostic test results will improve Outcome: Progressing Goal: Cardiovascular complication will be avoided Outcome: Progressing   Problem: Coping: Goal: Level of anxiety will decrease Outcome: Progressing   Problem: Elimination: Goal: Will not experience complications related to bowel motility Outcome: Progressing Goal: Will not experience complications related to urinary retention Outcome: Progressing   Problem: Pain Managment: Goal: General experience of comfort will improve Outcome: Progressing   Problem: Safety: Goal: Ability to remain free from injury will improve Outcome: Progressing   Problem: Skin Integrity: Goal: Risk for impaired skin integrity will decrease Outcome: Progressing   Problem: Education: Goal: Knowledge of General Education information will improve Description: Including pain rating scale, medication(s)/side effects and non-pharmacologic comfort measures Outcome: Not Progressing   Problem: Health Behavior/Discharge Planning: Goal: Ability to manage health-related needs will improve Outcome: Not Progressing   Problem: Clinical Measurements: Goal: Ability to maintain clinical measurements within normal limits will improve Outcome: Not Progressing Goal: Respiratory complications will improve Outcome: Not Progressing   Problem: Activity: Goal: Risk for activity intolerance will decrease Outcome: Not Progressing   Problem: Nutrition: Goal: Adequate nutrition will be maintained Outcome: Not Progressing
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
## 2019-07-22 NOTE — Progress Notes (Signed)
Patient transported to CT and back without complications.  

## 2019-07-22 NOTE — Progress Notes (Signed)
Patient transported to 2T62 without complications.
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
## 2019-07-22 NOTE — ED Notes (Signed)
Pt to CT w/ this RN, Mackey Birchwood RN, and pharmacist Corene Cornea
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
## 2019-07-22 NOTE — Progress Notes (Signed)
Patient ID: Gary [name], male   DOB: [birth year], 74 y.o.   MRN: [id number] Hpoglycemia. Changed IVF. Add Precedex. Albumin bolus for further volume resuscitation.  I spoke with his wife at the bedside.  Georganna Skeans, MD, MPH, FACS Please use [email or web address] to contact on call provider 07/22/2019 4:27 PM

## 2019-07-23 ENCOUNTER — Inpatient Hospital Stay (HOSPITAL_COMMUNITY): Payer: 59

## 2019-07-23 LAB — CBC
HCT: 36.2 % — ABNORMAL LOW (ref 39.0–52.0)
Hemoglobin: 12.1 g/dL — ABNORMAL LOW (ref 13.0–17.0)
MCH: 29.8 pg (ref 26.0–34.0)
MCHC: 33.4 g/dL (ref 30.0–36.0)
MCV: 89.2 fL (ref 80.0–100.0)
Platelets: 268 10*3/uL (ref 150–400)
RBC: 4.06 MIL/uL — ABNORMAL LOW (ref 4.22–5.81)
RDW: 13.4 % (ref 11.5–15.5)
WBC: 7.8 10*3/uL (ref 4.0–10.5)
nRBC: 0 % (ref 0.0–0.2)

## 2019-07-23 LAB — BASIC METABOLIC PANEL
Anion gap: 10 (ref 5–15)
BUN: 12 mg/dL (ref 8–23)
CO2: 19 mmol/L — ABNORMAL LOW (ref 22–32)
Calcium: 8.3 mg/dL — ABNORMAL LOW (ref 8.9–10.3)
Chloride: 115 mmol/L — ABNORMAL HIGH (ref 98–111)
Creatinine, Ser: 1.3 mg/dL — ABNORMAL HIGH (ref 0.61–1.24)
GFR calc Af Amer: >60 mL/min (ref >60)
GFR calc non Af Amer: 55 mL/min — ABNORMAL LOW (ref >60)
Glucose, Bld: 119 mg/dL — ABNORMAL HIGH (ref 70–99)
Potassium: 3.7 mmol/L (ref 3.5–5.1)
Sodium: 144 mmol/L (ref 135–145)

## 2019-07-23 LAB — GLUCOSE, CAPILLARY
Glucose-Capillary: 91 mg/dL (ref 70–99)
Glucose-Capillary: 94 mg/dL (ref 70–99)
Glucose-Capillary: 96 mg/dL (ref 70–99)
Glucose-Capillary: 108 mg/dL — ABNORMAL HIGH (ref 70–99)
Glucose-Capillary: 81 mg/dL (ref 70–99)

## 2019-07-23 LAB — TRIGLYCERIDES: Triglycerides: 109 mg/dL (ref <150)

## 2019-07-23 MED ORDER — ACETAMINOPHEN 650 MG RE SUPP
650.0000 mg | RECTAL | Status: DC | PRN
  Administered 2019-07-23 – 2019-08-08 (×4): 650 mg via RECTAL
  Filled 2019-07-23 (×4): qty 1

## 2019-07-23 MED ORDER — PIVOT 1.5 CAL PO LIQD
1000.0000 mL | ORAL | Status: DC
  Administered 2019-07-23 – 2019-07-28 (×6): 1000 mL
  Filled 2019-07-23 (×6): qty 1000
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
## 2019-07-23 NOTE — Progress Notes (Signed)
RT NOTE: RT called to pts room to place pt on full support due to low RR and sats dropping. Pt placed on full support and is tolerating well at this time.

## 2019-07-23 NOTE — Progress Notes (Signed)
[facility] Progress Note Patient Name: Gary [name] DOB: 07/22/1945 MRN: [id number]   Date of Service  07/23/2019  HPI/Events of Note  Vomited over ET tube.  eICU Interventions  - CxR and KUB stat ordered as per bed side RN request. AHRF. Concussion.      Intervention Category Intermediate Interventions: Other:  Gary [name] 07/23/2019, 9:13 PM

## 2019-07-23 NOTE — Progress Notes (Signed)
RT NOTE: Pt transported from [place] 22 to CT and back without any complications.

## 2019-07-23 NOTE — Progress Notes (Signed)
RT NOTE: Pt placed on PSV 10/5 and is tolerating well at this time. Rt to continue to monitor.
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
## 2019-07-24 LAB — GLUCOSE, CAPILLARY
Glucose-Capillary: 107 mg/dL — ABNORMAL HIGH (ref 70–99)
Glucose-Capillary: 112 mg/dL — ABNORMAL HIGH (ref 70–99)
Glucose-Capillary: 113 mg/dL — ABNORMAL HIGH (ref 70–99)
Glucose-Capillary: 126 mg/dL — ABNORMAL HIGH (ref 70–99)
Glucose-Capillary: 141 mg/dL — ABNORMAL HIGH (ref 70–99)
Glucose-Capillary: 91 mg/dL (ref 70–99)
Glucose-Capillary: 99 mg/dL (ref 70–99)
Glucose-Capillary: 99 mg/dL (ref 70–99)

## 2019-07-24 LAB — CBC
HCT: 38.2 % — ABNORMAL LOW (ref 39.0–52.0)
Hemoglobin: 13.2 g/dL (ref 13.0–17.0)
MCH: 32.8 pg (ref 26.0–34.0)
MCHC: 34.6 g/dL (ref 30.0–36.0)
MCV: 94.8 fL (ref 80.0–100.0)
Platelets: 246 10*3/uL (ref 150–400)
RBC: 4.03 MIL/uL — ABNORMAL LOW (ref 4.22–5.81)
RDW: 13.8 % (ref 11.5–15.5)
WBC: 10.9 10*3/uL — ABNORMAL HIGH (ref 4.0–10.5)
nRBC: 0 % (ref 0.0–0.2)

## 2019-07-24 LAB — BASIC METABOLIC PANEL
Anion gap: 9 (ref 5–15)
BUN: 11 mg/dL (ref 8–23)
CO2: 16 mmol/L — ABNORMAL LOW (ref 22–32)
Calcium: 8.3 mg/dL — ABNORMAL LOW (ref 8.9–10.3)
Chloride: 119 mmol/L — ABNORMAL HIGH (ref 98–111)
Creatinine, Ser: 1.26 mg/dL — ABNORMAL HIGH (ref 0.61–1.24)
GFR calc Af Amer: >60 mL/min (ref >60)
GFR calc non Af Amer: 57 mL/min — ABNORMAL LOW (ref >60)
Glucose, Bld: 149 mg/dL — ABNORMAL HIGH (ref 70–99)
Potassium: 4 mmol/L (ref 3.5–5.1)
Sodium: 144 mmol/L (ref 135–145)

## 2019-07-24 LAB — TRIGLYCERIDES: Triglycerides: 147 mg/dL (ref <150)

## 2019-07-24 MED ORDER — LISINOPRIL 20 MG PO TABS
20.0000 mg | ORAL_TABLET | Freq: Every day | ORAL | Status: DC
  Administered 2019-07-24 – 2019-08-08 (×12): 20 mg
  Filled 2019-07-24 (×13): qty 1
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
## 2019-07-25 ENCOUNTER — Inpatient Hospital Stay (HOSPITAL_COMMUNITY): Payer: 59

## 2019-07-25 LAB — BASIC METABOLIC PANEL
Anion gap: 9 (ref 5–15)
BUN: 11 mg/dL (ref 8–23)
CO2: 17 mmol/L — ABNORMAL LOW (ref 22–32)
Calcium: 8.4 mg/dL — ABNORMAL LOW (ref 8.9–10.3)
Chloride: 118 mmol/L — ABNORMAL HIGH (ref 98–111)
Creatinine, Ser: 1.22 mg/dL (ref 0.61–1.24)
GFR calc Af Amer: >60 mL/min (ref >60)
GFR calc non Af Amer: 59 mL/min — ABNORMAL LOW (ref >60)
Glucose, Bld: 112 mg/dL — ABNORMAL HIGH (ref 70–99)
Potassium: 4.8 mmol/L (ref 3.5–5.1)
Sodium: 144 mmol/L (ref 135–145)

## 2019-07-25 LAB — GLUCOSE, CAPILLARY
Glucose-Capillary: 103 mg/dL — ABNORMAL HIGH (ref 70–99)
Glucose-Capillary: 123 mg/dL — ABNORMAL HIGH (ref 70–99)
Glucose-Capillary: 127 mg/dL — ABNORMAL HIGH (ref 70–99)
Glucose-Capillary: 55 mg/dL — ABNORMAL LOW (ref 70–99)
Glucose-Capillary: 79 mg/dL (ref 70–99)
Glucose-Capillary: 97 mg/dL (ref 70–99)

## 2019-07-25 LAB — CBC
HCT: 35.4 % — ABNORMAL LOW (ref 39.0–52.0)
Hemoglobin: 12.3 g/dL — ABNORMAL LOW (ref 13.0–17.0)
MCH: 33.3 pg (ref 26.0–34.0)
MCHC: 34.7 g/dL (ref 30.0–36.0)
MCV: 95.9 fL (ref 80.0–100.0)
Platelets: 221 10*3/uL (ref 150–400)
RBC: 3.69 MIL/uL — ABNORMAL LOW (ref 4.22–5.81)
RDW: 13.8 % (ref 11.5–15.5)
WBC: 10 10*3/uL (ref 4.0–10.5)
nRBC: 0 % (ref 0.0–0.2)

## 2019-07-25 LAB — TRIGLYCERIDES: Triglycerides: 148 mg/dL (ref <150)

## 2019-07-25 LAB — MAGNESIUM: Magnesium: 1.8 mg/dL (ref 1.7–2.4)

## 2019-07-25 MED ORDER — DEXTROSE 50 % IV SOLN
12.5000 g | INTRAVENOUS | Status: AC
  Administered 2019-07-25: 12.5 g via INTRAVENOUS

## 2019-07-25 MED ORDER — SODIUM CHLORIDE 0.9% FLUSH
10.0000 mL | Freq: Two times a day (BID) | INTRAVENOUS | Status: DC
  Administered 2019-07-26 – 2019-08-11 (×29): 10 mL

## 2019-07-25 MED ORDER — METOCLOPRAMIDE HCL 5 MG/ML IJ SOLN
10.0000 mg | Freq: Three times a day (TID) | INTRAMUSCULAR | Status: DC
  Administered 2019-07-25 – 2019-08-11 (×48): 10 mg via INTRAVENOUS
  Filled 2019-07-25 (×49): qty 2

## 2019-07-25 MED ORDER — DEXTROSE 50 % IV SOLN
INTRAVENOUS | Status: AC
  Filled 2019-07-25: qty 50

## 2019-07-25 MED ORDER — OXYCODONE HCL 5 MG/5ML PO SOLN
10.0000 mg | ORAL | Status: DC | PRN
  Administered 2019-08-01 – 2019-08-08 (×7): 10 mg
  Filled 2019-07-25 (×9): qty 10

## 2019-07-25 MED ORDER — QUETIAPINE FUMARATE 50 MG PO TABS
50.0000 mg | ORAL_TABLET | Freq: Two times a day (BID) | ORAL | Status: DC
  Administered 2019-07-26 – 2019-08-03 (×15): 50 mg
  Filled 2019-07-25: qty 1
  Filled 2019-07-25: qty 2
  Filled 2019-07-25: qty 1
  Filled 2019-07-25 (×2): qty 2
  Filled 2019-07-25: qty 1
  Filled 2019-07-25 (×4): qty 2
  Filled 2019-07-25: qty 1
  Filled 2019-07-25 (×4): qty 2

## 2019-07-25 MED ORDER — SODIUM CHLORIDE 0.9% FLUSH
10.0000 mL | INTRAVENOUS | Status: DC | PRN
  Administered 2019-07-27: 10 mL

## 2019-07-25 MED ORDER — CLONAZEPAM 0.5 MG PO TABS
0.5000 mg | ORAL_TABLET | Freq: Two times a day (BID) | ORAL | Status: DC
  Administered 2019-07-26 – 2019-08-01 (×12): 0.5 mg
  Filled 2019-07-25 (×12): qty 1
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
## 2019-07-25 NOTE — Plan of Care (Signed)

## 2019-07-25 NOTE — TOC Progression Note (Signed)
Transition of Care [facility]) - Progression Note    Patient Details  Name: Gary [name] MRN: [id number] Date of Birth: April 15, 1945  Transition of [facility] CM/SW Contact  Oren Section Cleta Alberts, RN Phone Number: 07/25/2019, 4:56 PM  Clinical Narrative:   Faxed updated clinical information to Meredeth Ide, Worker's Comp. case Freight forwarder for [facility].  Fax [phone]    Expected Discharge Plan: [place] Barriers to Discharge: Continued Medical Work up  Expected Discharge Plan and Services Expected Discharge Plan: [place]   Discharge Planning Services: CM Consult   Living arrangements for the past 2 months: Single Family Home                                       Social Determinants of Health (SDOH) Interventions    Readmission Risk Interventions No flowsheet data found.  Reinaldo Raddle, RN, BSN  Trauma/Neuro ICU Case Manager [phone]

## 2019-07-25 NOTE — Procedures (Addendum)
Cortrak  Person Inserting Tube:  Nickey Canedo, RD Tube Type:  Cortrak - 43 inches Tube Location:  Left nare Initial Placement:  Stomach Secured by: Bridle Technique Used to Measure Tube Placement:  Documented cm marking at nare/ corner of mouth Cortrak Secured At:  85 cm Procedure Comments:  Tube located at the pylorus. Unable to advance into small bowel despite Reglan administration and multiple attempts.   X-ray is required, abdominal x-ray has been ordered by the Cortrak team. Please confirm tube placement before using the Cortrak tube.   If the tube becomes dislodged please keep the tube and contact the Cortrak team at [email or web address] (password [email or web address]) for replacement.  If after hours and replacement cannot be delayed, place a NG tube and confirm placement with an abdominal x-ray.    Mariana Single RD, LDN Clinical Nutrition Pager listed in [place]
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
## 2019-07-26 ENCOUNTER — Inpatient Hospital Stay (HOSPITAL_COMMUNITY): Payer: 59

## 2019-07-26 LAB — GLUCOSE, CAPILLARY
Glucose-Capillary: 112 mg/dL — ABNORMAL HIGH (ref 70–99)
Glucose-Capillary: 126 mg/dL — ABNORMAL HIGH (ref 70–99)
Glucose-Capillary: 152 mg/dL — ABNORMAL HIGH (ref 70–99)
Glucose-Capillary: 157 mg/dL — ABNORMAL HIGH (ref 70–99)
Glucose-Capillary: 53 mg/dL — ABNORMAL LOW (ref 70–99)
Glucose-Capillary: 77 mg/dL (ref 70–99)
Glucose-Capillary: 86 mg/dL (ref 70–99)

## 2019-07-26 LAB — BASIC METABOLIC PANEL
Anion gap: 4 — ABNORMAL LOW (ref 5–15)
BUN: 8 mg/dL (ref 8–23)
CO2: 20 mmol/L — ABNORMAL LOW (ref 22–32)
Calcium: 8.2 mg/dL — ABNORMAL LOW (ref 8.9–10.3)
Chloride: 117 mmol/L — ABNORMAL HIGH (ref 98–111)
Creatinine, Ser: 1.42 mg/dL — ABNORMAL HIGH (ref 0.61–1.24)
GFR calc Af Amer: 57 mL/min — ABNORMAL LOW (ref >60)
GFR calc non Af Amer: 49 mL/min — ABNORMAL LOW (ref >60)
Glucose, Bld: 103 mg/dL — ABNORMAL HIGH (ref 70–99)
Potassium: 4.1 mmol/L (ref 3.5–5.1)
Sodium: 141 mmol/L (ref 135–145)

## 2019-07-26 LAB — CBC
HCT: 32.8 % — ABNORMAL LOW (ref 39.0–52.0)
Hemoglobin: 11.2 g/dL — ABNORMAL LOW (ref 13.0–17.0)
MCH: 31.8 pg (ref 26.0–34.0)
MCHC: 34.1 g/dL (ref 30.0–36.0)
MCV: 93.2 fL (ref 80.0–100.0)
Platelets: 271 10*3/uL (ref 150–400)
RBC: 3.52 MIL/uL — ABNORMAL LOW (ref 4.22–5.81)
RDW: 13.6 % (ref 11.5–15.5)
WBC: 10.5 10*3/uL (ref 4.0–10.5)
nRBC: 0 % (ref 0.0–0.2)

## 2019-07-26 LAB — POCT I-STAT 7, (LYTES, BLD GAS, ICA,H+H)
Acid-base deficit: 6 mmol/L — ABNORMAL HIGH (ref 0.0–2.0)
Bicarbonate: 18 mmol/L — ABNORMAL LOW (ref 20.0–28.0)
Calcium, Ion: 1.25 mmol/L (ref 1.15–1.40)
HCT: 31 % — ABNORMAL LOW (ref 39.0–52.0)
Hemoglobin: 10.5 g/dL — ABNORMAL LOW (ref 13.0–17.0)
O2 Saturation: 99 %
Patient temperature: 100.2
Potassium: 4 mmol/L (ref 3.5–5.1)
Sodium: 145 mmol/L (ref 135–145)
TCO2: 19 mmol/L — ABNORMAL LOW (ref 22–32)
pCO2 arterial: 32.7 mmHg (ref 32.0–48.0)
pH, Arterial: 7.353 (ref 7.350–7.450)
pO2, Arterial: 132 mmHg — ABNORMAL HIGH (ref 83.0–108.0)

## 2019-07-26 LAB — TRIGLYCERIDES: Triglycerides: 143 mg/dL (ref <150)

## 2019-07-26 MED ORDER — PROSOURCE TF PO LIQD
45.0000 mL | Freq: Every day | ORAL | Status: DC
  Administered 2019-07-26 – 2019-08-05 (×9): 45 mL
  Filled 2019-07-26 (×9): qty 45

## 2019-07-26 MED ORDER — LEVETIRACETAM IN NACL 500 MG/100ML IV SOLN
500.0000 mg | Freq: Two times a day (BID) | INTRAVENOUS | Status: AC
  Administered 2019-07-26 – 2019-08-01 (×14): 500 mg via INTRAVENOUS
  Filled 2019-07-26 (×13): qty 100

## 2019-07-26 MED ORDER — DOCUSATE SODIUM 50 MG/5ML PO LIQD
100.0000 mg | Freq: Two times a day (BID) | ORAL | Status: DC
  Administered 2019-07-27 – 2019-08-09 (×21): 100 mg
  Filled 2019-07-26 (×22): qty 10

## 2019-07-26 MED ORDER — SODIUM CHLORIDE 0.9 % IV SOLN: INTRAVENOUS | Status: DC

## 2019-07-26 MED ORDER — CHLORHEXIDINE GLUCONATE CLOTH 2 % EX PADS
6.0000 | MEDICATED_PAD | Freq: Once | CUTANEOUS | Status: AC
  Administered 2019-07-26: 6 via TOPICAL

## 2019-07-26 MED ORDER — CHLORHEXIDINE GLUCONATE CLOTH 2 % EX PADS
6.0000 | MEDICATED_PAD | Freq: Once | CUTANEOUS | Status: AC
  Administered 2019-07-27: 6 via TOPICAL

## 2019-07-26 MED ORDER — ACETAMINOPHEN 160 MG/5ML PO SOLN
650.0000 mg | ORAL | Status: DC | PRN
  Administered 2019-08-02 – 2019-08-11 (×8): 650 mg
  Filled 2019-07-26 (×9): qty 20.3

## 2019-07-26 MED ORDER — CEFAZOLIN SODIUM-DEXTROSE 2-4 GM/100ML-% IV SOLN
2.0000 g | INTRAVENOUS | Status: AC
  Administered 2019-07-27: 2 g via INTRAVENOUS
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
## 2019-07-26 NOTE — Progress Notes (Signed)
Patient ID: Gary [name], male   DOB: July 23, 1945, 74 y.o.   MRN: [id number] BP (!) 101/52   Pulse 85   Temp (!) 100.4 F (38 C)   Resp 16   Ht 6' (1.829 m)   Wt 76.5 kg   SpO2 92%   BMI 22.87 kg/m  Sedated, intubated Not responsive to noxious stimuli in bilateral upper extremities, right lower extremity. Left foot dorsiflexes, upgoing toe on left Perrl, +corneals No cough today No real change in exam. For OR tomorrow No recommendations other than current care. With lightened sedation told by staff that he does breathe over the ventilator rate. No purposeful movement observed.

## 2019-07-27 ENCOUNTER — Encounter (HOSPITAL_COMMUNITY): Admission: EM | Disposition: A | Discharge status: Rehabilitation Facility | Payer: Self-pay | Source: Home / Self Care

## 2019-07-27 ENCOUNTER — Inpatient Hospital Stay (HOSPITAL_COMMUNITY): Payer: 59 | Admitting: Certified Registered"

## 2019-07-27 ENCOUNTER — Encounter (HOSPITAL_COMMUNITY): Payer: Self-pay

## 2019-07-27 DIAGNOSIS — Z9911 Dependence on respirator [ventilator] status: Secondary | ICD-10-CM

## 2019-07-27 DIAGNOSIS — S02609A Fracture of mandible, unspecified, initial encounter for closed fracture: Secondary | ICD-10-CM

## 2019-07-27 DIAGNOSIS — S0240CA Maxillary fracture, right side, initial encounter for closed fracture: Secondary | ICD-10-CM

## 2019-07-27 DIAGNOSIS — S02841A Fracture of lateral orbital wall, right side, initial encounter for closed fracture: Secondary | ICD-10-CM

## 2019-07-27 DIAGNOSIS — Z515 Encounter for palliative care: Secondary | ICD-10-CM

## 2019-07-27 HISTORY — PX: ORIF MANDIBULAR FRACTURE: SHX2127

## 2019-07-27 LAB — BASIC METABOLIC PANEL
Anion gap: 6 (ref 5–15)
BUN: 13 mg/dL (ref 8–23)
CO2: 18 mmol/L — ABNORMAL LOW (ref 22–32)
Calcium: 8.4 mg/dL — ABNORMAL LOW (ref 8.9–10.3)
Chloride: 119 mmol/L — ABNORMAL HIGH (ref 98–111)
Creatinine, Ser: 1.38 mg/dL — ABNORMAL HIGH (ref 0.61–1.24)
GFR calc Af Amer: 59 mL/min — ABNORMAL LOW (ref >60)
GFR calc non Af Amer: 51 mL/min — ABNORMAL LOW (ref >60)
Glucose, Bld: 102 mg/dL — ABNORMAL HIGH (ref 70–99)
Potassium: 3.6 mmol/L (ref 3.5–5.1)
Sodium: 143 mmol/L (ref 135–145)

## 2019-07-27 LAB — CBC
HCT: 32.1 % — ABNORMAL LOW (ref 39.0–52.0)
Hemoglobin: 10.5 g/dL — ABNORMAL LOW (ref 13.0–17.0)
MCH: 29.3 pg (ref 26.0–34.0)
MCHC: 32.7 g/dL (ref 30.0–36.0)
MCV: 89.7 fL (ref 80.0–100.0)
Platelets: 258 10*3/uL (ref 150–400)
RBC: 3.58 MIL/uL — ABNORMAL LOW (ref 4.22–5.81)
RDW: 13.8 % (ref 11.5–15.5)
WBC: 6.7 10*3/uL (ref 4.0–10.5)
nRBC: 0 % (ref 0.0–0.2)

## 2019-07-27 LAB — GLUCOSE, CAPILLARY
Glucose-Capillary: 109 mg/dL — ABNORMAL HIGH (ref 70–99)
Glucose-Capillary: 113 mg/dL — ABNORMAL HIGH (ref 70–99)
Glucose-Capillary: 127 mg/dL — ABNORMAL HIGH (ref 70–99)
Glucose-Capillary: 83 mg/dL (ref 70–99)
Glucose-Capillary: 83 mg/dL (ref 70–99)
Glucose-Capillary: 84 mg/dL (ref 70–99)

## 2019-07-27 LAB — POCT I-STAT 7, (LYTES, BLD GAS, ICA,H+H)
Acid-base deficit: 5 mmol/L — ABNORMAL HIGH (ref 0.0–2.0)
Bicarbonate: 18.6 mmol/L — ABNORMAL LOW (ref 20.0–28.0)
Calcium, Ion: 1.28 mmol/L (ref 1.15–1.40)
HCT: 29 % — ABNORMAL LOW (ref 39.0–52.0)
Hemoglobin: 9.9 g/dL — ABNORMAL LOW (ref 13.0–17.0)
O2 Saturation: 99 %
Patient temperature: 36.4
Potassium: 3.6 mmol/L (ref 3.5–5.1)
Sodium: 146 mmol/L — ABNORMAL HIGH (ref 135–145)
TCO2: 19 mmol/L — ABNORMAL LOW (ref 22–32)
pCO2 arterial: 28.6 mmHg — ABNORMAL LOW (ref 32.0–48.0)
pH, Arterial: 7.418 (ref 7.350–7.450)
pO2, Arterial: 131 mmHg — ABNORMAL HIGH (ref 83.0–108.0)

## 2019-07-27 LAB — TRIGLYCERIDES: Triglycerides: 207 mg/dL — ABNORMAL HIGH (ref <150)

## 2019-07-27 LAB — MAGNESIUM: Magnesium: 1.6 mg/dL — ABNORMAL LOW (ref 1.7–2.4)

## 2019-07-27 LAB — PHOSPHORUS: Phosphorus: 2.9 mg/dL (ref 2.5–4.6)

## 2019-07-27 SURGERY — OPEN REDUCTION INTERNAL FIXATION (ORIF) MANDIBULAR FRACTURE
Anesthesia: General | Site: Mouth | Laterality: Bilateral

## 2019-07-27 MED ORDER — OXYMETAZOLINE HCL 0.05 % NA SOLN
NASAL | Status: AC
  Filled 2019-07-27: qty 30

## 2019-07-27 MED ORDER — MIDAZOLAM HCL 2 MG/2ML IJ SOLN
INTRAMUSCULAR | Status: AC
  Filled 2019-07-27: qty 2

## 2019-07-27 MED ORDER — DEXAMETHASONE SODIUM PHOSPHATE 10 MG/ML IJ SOLN
INTRAMUSCULAR | Status: AC
  Filled 2019-07-27: qty 1

## 2019-07-27 MED ORDER — ONDANSETRON HCL 4 MG/2ML IJ SOLN
INTRAMUSCULAR | Status: AC
  Filled 2019-07-27: qty 2

## 2019-07-27 MED ORDER — FENTANYL CITRATE (PF) 250 MCG/5ML IJ SOLN
INTRAMUSCULAR | Status: AC
  Filled 2019-07-27: qty 5

## 2019-07-27 MED ORDER — ROCURONIUM BROMIDE 10 MG/ML (PF) SYRINGE
PREFILLED_SYRINGE | INTRAVENOUS | Status: DC | PRN
  Administered 2019-07-27 (×2): 20 mg via INTRAVENOUS
  Administered 2019-07-27: 60 mg via INTRAVENOUS

## 2019-07-27 MED ORDER — HYDROMORPHONE HCL 1 MG/ML IJ SOLN
INTRAMUSCULAR | Status: AC
  Filled 2019-07-27: qty 0.5

## 2019-07-27 MED ORDER — LIDOCAINE-EPINEPHRINE 1 %-1:100000 IJ SOLN
INTRAMUSCULAR | Status: DC | PRN
  Administered 2019-07-27: 6 mL

## 2019-07-27 MED ORDER — OXYMETAZOLINE HCL 0.05 % NA SOLN
NASAL | Status: DC | PRN
  Administered 2019-07-27: 2 via NASAL

## 2019-07-27 MED ORDER — PROPOFOL 10 MG/ML IV BOLUS
INTRAVENOUS | Status: AC
  Filled 2019-07-27: qty 20

## 2019-07-27 MED ORDER — CEFAZOLIN SODIUM 1 G IJ SOLR
INTRAMUSCULAR | Status: AC
  Filled 2019-07-27: qty 20

## 2019-07-27 MED ORDER — ROCURONIUM BROMIDE 10 MG/ML (PF) SYRINGE
PREFILLED_SYRINGE | INTRAVENOUS | Status: AC
  Filled 2019-07-27: qty 10

## 2019-07-27 MED ORDER — LACTATED RINGERS IV SOLN: INTRAVENOUS | Status: DC | PRN

## 2019-07-27 MED ORDER — HYDROMORPHONE HCL 1 MG/ML IJ SOLN
INTRAMUSCULAR | Status: DC | PRN
  Administered 2019-07-27 (×2): .5 mg via INTRAVENOUS

## 2019-07-27 MED ORDER — FENTANYL CITRATE (PF) 100 MCG/2ML IJ SOLN
INTRAMUSCULAR | Status: DC | PRN
  Administered 2019-07-27 (×5): 50 ug via INTRAVENOUS

## 2019-07-27 MED ORDER — DEXAMETHASONE SODIUM PHOSPHATE 10 MG/ML IJ SOLN
INTRAMUSCULAR | Status: DC | PRN
Start: 2019-07-27 — End: 2019-07-27
  Administered 2019-07-27: 5 mg via INTRAVENOUS

## 2019-07-27 MED ORDER — 0.9 % SODIUM CHLORIDE (POUR BTL) OPTIME
TOPICAL | Status: DC | PRN
  Administered 2019-07-27: 1000 mL

## 2019-07-27 MED ORDER — OXYMETAZOLINE HCL 0.05 % NA SOLN
NASAL | Status: DC | PRN
  Administered 2019-07-27: 1

## 2019-07-27 MED ORDER — MIDAZOLAM HCL 5 MG/5ML IJ SOLN
INTRAMUSCULAR | Status: DC | PRN
  Administered 2019-07-27: 2 mg via INTRAVENOUS

## 2019-07-27 MED ORDER — MAGNESIUM SULFATE 4 GM/100ML IV SOLN
4.0000 g | Freq: Once | INTRAVENOUS | Status: AC
  Administered 2019-07-27: 4 g via INTRAVENOUS
  Filled 2019-07-27: qty 100

## 2019-07-27 MED ORDER — LIDOCAINE-EPINEPHRINE 1 %-1:100000 IJ SOLN
INTRAMUSCULAR | Status: AC
  Filled 2019-07-27: qty 1

## 2019-07-27 SURGICAL SUPPLY — 45 items
ADH SKN CLS APL DERMABOND .7 (GAUZE/BANDAGES/DRESSINGS) ×1
BLADE CLIPPER SURG (BLADE) IMPLANT
BLADE SURG 15 STRL LF DISP TIS (BLADE) ×1 IMPLANT
BLADE SURG 15 STRL SS (BLADE) ×2
CANISTER SUCT 3000ML PPV (MISCELLANEOUS) ×2 IMPLANT
CLEANER TIP ELECTROSURG 2X2 (MISCELLANEOUS) ×2 IMPLANT
COVER SURGICAL LIGHT HANDLE (MISCELLANEOUS) ×2 IMPLANT
COVER WAND RF STERILE (DRAPES) ×2 IMPLANT
DERMABOND ADVANCED (GAUZE/BANDAGES/DRESSINGS) ×1
DERMABOND ADVANCED .7 DNX12 (GAUZE/BANDAGES/DRESSINGS) IMPLANT
ELECT COATED BLADE 2.86 ST (ELECTRODE) ×2 IMPLANT
ELECT REM PT RETURN 9FT ADLT (ELECTROSURGICAL) ×2
ELECTRODE REM PT RTRN 9FT ADLT (ELECTROSURGICAL) ×1 IMPLANT
GLOVE BIO SURGEON STRL SZ 6.5 (GLOVE) ×2 IMPLANT
GOWN STRL REUS W/ TWL LRG LVL3 (GOWN DISPOSABLE) ×2 IMPLANT
GOWN STRL REUS W/TWL LRG LVL3 (GOWN DISPOSABLE) ×4
KIT BASIN OR (CUSTOM PROCEDURE TRAY) ×2 IMPLANT
KIT TURNOVER KIT B (KITS) ×2 IMPLANT
NDL PRECISIONGLIDE 27X1.5 (NEEDLE) ×1 IMPLANT
NEEDLE PRECISIONGLIDE 27X1.5 (NEEDLE) ×2 IMPLANT
NS IRRIG 1000ML POUR BTL (IV SOLUTION) ×2 IMPLANT
PAD ARMBOARD 7.5X6 YLW CONV (MISCELLANEOUS) ×4 IMPLANT
PENCIL BUTTON HOLSTER BLD 10FT (ELECTRODE) ×2 IMPLANT
PLATE DOUBLE 6 HOLE (Plate) ×1 IMPLANT
PLATE RGD 1.5/0.6 4 HOLE (Plate) ×1 IMPLANT
PLATE RGD 1.5/0.6 8 REG STR (Plate) ×1 IMPLANT
POSITIONER HEAD DONUT 9IN (MISCELLANEOUS) ×2 IMPLANT
PROTECTOR CORNEAL (OPHTHALMIC RELATED) IMPLANT
SCISSORS WIRE ANG 4 3/4 DISP (INSTRUMENTS) ×2 IMPLANT
SCREW HT SD X-DR 1.5X5 (Screw) ×10 IMPLANT
SCREW HT X DR EMRG 1.8X5MM (Screw) ×2 IMPLANT
SCREW NON LOCK HT X-DR 1.8X7 (Screw) ×2 IMPLANT
SPLINT NASAL DOYLE BI-VL (GAUZE/BANDAGES/DRESSINGS) IMPLANT
SUT MON AB 3-0 SH 27 (SUTURE) ×4
SUT MON AB 3-0 SH27 (SUTURE) ×2 IMPLANT
SUT PROLENE 6 0 PC 1 (SUTURE) IMPLANT
SUT STEEL 0 (SUTURE)
SUT STEEL 0 18XMFL TIE 17 (SUTURE) IMPLANT
SUT STEEL 1 (SUTURE) IMPLANT
SUT STEEL 2 (SUTURE) IMPLANT
SUT STEEL 4 (SUTURE) IMPLANT
SUT VICRYL 4-0 PS2 18IN ABS (SUTURE) IMPLANT
TOWEL GREEN STERILE (TOWEL DISPOSABLE) ×2 IMPLANT
TOWEL GREEN STERILE FF (TOWEL DISPOSABLE) ×2 IMPLANT
TRAY ENT MC OR (CUSTOM PROCEDURE TRAY) ×2 IMPLANT
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
## 2019-07-27 NOTE — Anesthesia Procedure Notes (Signed)
Procedure Name: Intubation Date/Time: 07/27/2019 1:03 PM Performed by: Neldon Newport, CRNA Pre-anesthesia Checklist: Patient identified, Emergency Drugs available, Suction available and Patient being monitored Patient Re-evaluated:Patient Re-evaluated prior to induction Oxygen Delivery Method: Circle System Utilized Preoxygenation: Pre-oxygenation with 100% oxygen Induction Type: Combination inhalational/ intravenous induction and Inhalational induction with existing ETT Laryngoscope Size: Glidescope and 4 Grade View: Grade I Nasal Tubes: Nasal Rae, Nasal prep performed, Right and Magill forceps- large, utilized Tube size: 7.0 mm Number of attempts: 1 Placement Confirmation: ETT inserted through vocal cords under direct vision,  positive ETCO2 and breath sounds checked- equal and bilateral Tube secured with: Tape Dental Injury: Teeth and Oropharynx as per pre-operative assessment  Comments: Pre-existing oral ETT changed for nasal ETT using glidescope assistance.

## 2019-07-27 NOTE — Interval H&P Note (Signed)
History and Physical Interval Note:  07/27/2019 11:20 AM  Gary [name]  has presented today for surgery, with the diagnosis of mandible fracture.  The various methods of treatment have been discussed with the patient and family. After consideration of risks, benefits and other options for treatment, the patient has consented to  Procedure(s) with comments: OPEN REDUCTION INTERNAL FIXATION (ORIF) MANDIBULAR FRACTURE (Bilateral) - 2 hours, please as a surgical intervention.  The patient's history has been reviewed, patient examined, no change in status, stable for surgery.  I have reviewed the patient's chart and labs.  Questions were answered to the patient's satisfaction.     Loel Lofty Cniyah Sproull

## 2019-07-27 NOTE — Anesthesia Procedure Notes (Signed)
Procedure Name: Intubation Date/Time: 07/27/2019 3:00 PM Performed by: Neldon Newport, CRNA Pre-anesthesia Checklist: Timeout performed, Suction available, Patient being monitored, Emergency Drugs available and Patient identified Patient Re-evaluated:Patient Re-evaluated prior to induction Oxygen Delivery Method: Circle system utilized Preoxygenation: Pre-oxygenation with 100% oxygen Laryngoscope Size: Glidescope and 4 Grade View: Grade I Tube type: Oral Tube size: 8.0 mm Number of attempts: 1 Placement Confirmation: ETT inserted through vocal cords under direct vision,  positive ETCO2 and breath sounds checked- equal and bilateral Secured at: 23 cm Tube secured with: Tape Dental Injury: Teeth and Oropharynx as per pre-operative assessment  Comments: Prior to intubation, Nasotracheal tube removed.

## 2019-07-27 NOTE — Transfer of Care (Signed)
Immediate Anesthesia Transfer of Care Note  Patient: Gary [name]  Procedure(s) Performed: OPEN REDUCTION INTERNAL FIXATION (ORIF) OF COMPLEX ZYGOMATIC FRACTURE (Bilateral Mouth)  Patient Location: ICU  Anesthesia Type:General  Level of Consciousness: Patient remains intubated per anesthesia plan  Airway & Oxygen Therapy: Patient remains intubated per anesthesia plan and Patient placed on Ventilator (see vital sign flow sheet for setting)  Post-op Assessment: Report given to RN and Post -op Vital signs reviewed and stable  Post vital signs: Reviewed and stable  Last Vitals:  Vitals Value Taken Time  BP 138/64 07/27/19 1514  Temp    Pulse 84 07/27/19 1516  Resp 16 07/27/19 1516  SpO2 96 % 07/27/19 1516  Vitals shown include unvalidated device data.  Last Pain:  Vitals:   07/27/19 0400  TempSrc: Bladder         Complications: No complications documented.
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
## 2019-07-27 NOTE — Progress Notes (Signed)
Patient ID: Gary [name], male   DOB: 11-Nov-1945, 74 y.o.   MRN: [id number] BP (!) 150/68   Pulse 71   Temp 99.5 F (37.5 C)   Resp 16   Ht 6' (1.829 m)   Wt 78.3 kg Comment: weighed 3 xs to verify  SpO2 100%   BMI 23.41 kg/m  Sedated, intubated Not responsive to voice +cough, corneals No change. Post op

## 2019-07-27 NOTE — Anesthesia Postprocedure Evaluation (Signed)
Anesthesia Post Note  Patient: Mallory Enriques  Procedure(s) Performed: OPEN REDUCTION INTERNAL FIXATION (ORIF) OF COMPLEX ZYGOMATIC FRACTURE (Bilateral Mouth)     Patient location during evaluation: SICU Anesthesia Type: General Level of consciousness: sedated and patient remains intubated per anesthesia plan Pain management: pain level controlled Vital Signs Assessment: post-procedure vital signs reviewed and stable Respiratory status: patient remains intubated per anesthesia plan and patient on ventilator - see flowsheet for VS Cardiovascular status: stable Postop Assessment: no apparent nausea or vomiting Anesthetic complications: no   No complications documented.  Last Vitals:  Vitals:   07/27/19 1200 07/27/19 1522  BP: 136/66 138/64  Pulse: 64 81  Resp: 16   Temp: 37.5 C   SpO2: 100%     Last Pain:  Vitals:   07/27/19 0400  TempSrc: Bladder                 Pervis Hocking
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
## 2019-07-27 NOTE — Plan of Care (Signed)
  Problem: Clinical Measurements: Goal: Ability to maintain clinical measurements within normal limits will improve Outcome: Progressing Goal: Will remain free from infection Outcome: Progressing Goal: Diagnostic test results will improve Outcome: Progressing Goal: Respiratory complications will improve Outcome: Progressing Goal: Cardiovascular complication will be avoided Outcome: Progressing   Problem: Nutrition: Goal: Adequate nutrition will be maintained Outcome: Progressing   Problem: Coping: Goal: Level of anxiety will decrease Outcome: Progressing   Problem: Pain Managment: Goal: General experience of comfort will improve Outcome: Progressing   Problem: Safety: Goal: Ability to remain free from injury will improve Outcome: Progressing   
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
## 2019-07-28 ENCOUNTER — Encounter (HOSPITAL_COMMUNITY): Payer: Self-pay | Admitting: Plastic Surgery

## 2019-07-28 ENCOUNTER — Inpatient Hospital Stay (HOSPITAL_COMMUNITY): Payer: 59

## 2019-07-28 DIAGNOSIS — Z9911 Dependence on respirator [ventilator] status: Secondary | ICD-10-CM

## 2019-07-28 DIAGNOSIS — T1490XA Injury, unspecified, initial encounter: Secondary | ICD-10-CM

## 2019-07-28 DIAGNOSIS — Z515 Encounter for palliative care: Secondary | ICD-10-CM

## 2019-07-28 LAB — GLUCOSE, CAPILLARY
Glucose-Capillary: 120 mg/dL — ABNORMAL HIGH (ref 70–99)
Glucose-Capillary: 99 mg/dL (ref 70–99)
Glucose-Capillary: 102 mg/dL — ABNORMAL HIGH (ref 70–99)
Glucose-Capillary: 106 mg/dL — ABNORMAL HIGH (ref 70–99)
Glucose-Capillary: 99 mg/dL (ref 70–99)
Glucose-Capillary: 113 mg/dL — ABNORMAL HIGH (ref 70–99)

## 2019-07-28 LAB — TRIGLYCERIDES: Triglycerides: 171 mg/dL — ABNORMAL HIGH (ref <150)

## 2019-07-28 LAB — PHOSPHORUS: Phosphorus: 4 mg/dL (ref 2.5–4.6)

## 2019-07-28 LAB — MAGNESIUM: Magnesium: 2.3 mg/dL (ref 1.7–2.4)

## 2019-07-28 MED ORDER — HYDRALAZINE HCL 25 MG PO TABS
25.0000 mg | ORAL_TABLET | Freq: Four times a day (QID) | ORAL | Status: DC | PRN
  Administered 2019-07-31 – 2019-08-03 (×2): 25 mg
  Filled 2019-07-28 (×2): qty 1

## 2019-07-28 MED ORDER — BISACODYL 10 MG RE SUPP: 10.0000 mg | Freq: Every day | RECTAL | Status: DC | PRN

## 2019-07-28 MED ORDER — PIVOT 1.5 CAL PO LIQD
1000.0000 mL | ORAL | Status: DC
  Administered 2019-07-28 – 2019-07-31 (×4): 1000 mL
  Filled 2019-07-28 (×2): qty 1000

## 2019-07-28 NOTE — Progress Notes (Signed)
Patient ID: Gary [name], male   DOB: 1945/08/13, 74 y.o.   MRN: [id number]  This NP visited patient at the bedside as a follow up for palliative medicine needs and emotional support.   Medical records reviewed  [name] and daughter at bedside.  Continued conversation regarding current medical situation.  Family have many questions regarding neuro assessment. I answered their questions to the best my ability.  Trauma and Neurology readily available for family's questions.  They remain hopeful for improvement.  Daughters shares loving thoughts of her father's sense of humor  Discussed with patient the importance of continued conversation with family and their  medical providers regarding overall plan of care and treatment options,  ensuring decisions are within the context of the patients values and GOCs.  Questions and concerns addressed     I discussed case with Dr Grandville Silos and Dr. Christella Noa and bedside nursing  Total time spent on the unit was 25 minutes   Greater than 50% of the time was spent in counseling and coordination of care  This nurse practitioner informed  The family and the attending that I will be out of the hospital until Monday morning.  I will follow-up at that time.  Call palliative medicine team phone # [phone] with questions or concerns in the interim  Wadie Lessen NP  Palliative Medicine Team Team Phone # [phone] Pager [phone]

## 2019-07-28 NOTE — Progress Notes (Signed)
Patient ID: Gary [name], male   DOB: 03-02-1945, 74 y.o.   MRN: [id number] BP (!) 142/65   Pulse 74   Temp 99.5 F (37.5 C) (Oral)   Resp 14   Ht 6' (1.829 m)   Wt 78.3 kg Comment: weighed 3 xs to verify  SpO2 100%   BMI 23.41 kg/m  Purposeful with upper extremities, spontaneous movements Perrl,  Winces with noxious stimuli Vigorous cough Sedation has been decreased, much more responsive this evening.  Significant improvement. Sedation most likely reason as to recent exams
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
## 2019-07-29 DIAGNOSIS — T1490XA Injury, unspecified, initial encounter: Secondary | ICD-10-CM

## 2019-07-29 LAB — BASIC METABOLIC PANEL
Anion gap: 7 (ref 5–15)
BUN: 20 mg/dL (ref 8–23)
CO2: 22 mmol/L (ref 22–32)
Calcium: 8.2 mg/dL — ABNORMAL LOW (ref 8.9–10.3)
Chloride: 116 mmol/L — ABNORMAL HIGH (ref 98–111)
Creatinine, Ser: 1.2 mg/dL (ref 0.61–1.24)
GFR calc Af Amer: >60 mL/min (ref >60)
GFR calc non Af Amer: >60 mL/min (ref >60)
Glucose, Bld: 115 mg/dL — ABNORMAL HIGH (ref 70–99)
Potassium: 3.3 mmol/L — ABNORMAL LOW (ref 3.5–5.1)
Sodium: 145 mmol/L (ref 135–145)

## 2019-07-29 LAB — MAGNESIUM: Magnesium: 1.9 mg/dL (ref 1.7–2.4)

## 2019-07-29 LAB — PHOSPHORUS: Phosphorus: 2.6 mg/dL (ref 2.5–4.6)

## 2019-07-29 LAB — GLUCOSE, CAPILLARY
Glucose-Capillary: 111 mg/dL — ABNORMAL HIGH (ref 70–99)
Glucose-Capillary: 104 mg/dL — ABNORMAL HIGH (ref 70–99)
Glucose-Capillary: 104 mg/dL — ABNORMAL HIGH (ref 70–99)
Glucose-Capillary: 123 mg/dL — ABNORMAL HIGH (ref 70–99)
Glucose-Capillary: 118 mg/dL — ABNORMAL HIGH (ref 70–99)
Glucose-Capillary: 131 mg/dL — ABNORMAL HIGH (ref 70–99)

## 2019-07-29 LAB — TRIGLYCERIDES: Triglycerides: 195 mg/dL — ABNORMAL HIGH (ref <150)

## 2019-07-29 MED ORDER — MAGNESIUM SULFATE 2 GM/50ML IV SOLN
2.0000 g | Freq: Once | INTRAVENOUS | Status: AC
  Administered 2019-07-29: 2 g via INTRAVENOUS
  Filled 2019-07-29: qty 50

## 2019-07-29 MED ORDER — METOPROLOL TARTRATE 5 MG/5ML IV SOLN
5.0000 mg | Freq: Four times a day (QID) | INTRAVENOUS | Status: DC | PRN
  Administered 2019-07-30 – 2019-08-08 (×6): 5 mg via INTRAVENOUS
  Filled 2019-07-29 (×6): qty 5

## 2019-07-29 MED ORDER — POTASSIUM CHLORIDE 20 MEQ/15ML (10%) PO SOLN
20.0000 meq | ORAL | Status: AC
  Administered 2019-07-29 (×2): 20 meq
  Filled 2019-07-29 (×2): qty 15

## 2019-07-29 MED ORDER — BISACODYL 10 MG RE SUPP
10.0000 mg | Freq: Once | RECTAL | Status: AC
  Administered 2019-07-29: 10 mg via RECTAL
  Filled 2019-07-29: qty 1

## 2019-07-29 MED ORDER — POTASSIUM CHLORIDE 10 MEQ/100ML IV SOLN
10.0000 meq | INTRAVENOUS | Status: AC
  Administered 2019-07-29 (×4): 10 meq via INTRAVENOUS
  Filled 2019-07-29 (×4): qty 100
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
## 2019-07-29 NOTE — TOC Progression Note (Signed)
Transition of Care [facility]) - Progression Note    Patient Details  Name: Makaio Mach MRN: [id number] Date of Birth: 05-07-1945  Transition of Care [facility]) CM/SW Contact  Oren Section Cleta Alberts, RN Phone Number: 07/29/2019, 3:00 PM  Clinical Narrative: Continue to follow patient as he progresses; spoke with wife [name] today to offer emotional support.  She denies any needs at this time, but appreciates the call.  Received call from Meredeth Ide with [facility]; faxed updated clinical information to her, per her request.  Fax number [phone].    Expected Discharge Plan: IP Rehab Facility Barriers to Discharge: Continued Medical Work up  Expected Discharge Plan and Services Expected Discharge Plan: [place]   Discharge Planning Services: CM Consult   Living arrangements for the past 2 months: Single Family Home                                       Social Determinants of Health (SDOH) Interventions    Readmission Risk Interventions No flowsheet data found.  Reinaldo Raddle, RN, BSN  Trauma/Neuro ICU Case Manager [phone]
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
## 2019-07-29 NOTE — Progress Notes (Signed)
Patient ID: Gary [name], male   DOB: Dec 19, 1945, 74 y.o.   MRN: [id number] BP (!) 144/63   Pulse 69   Temp 98 F (36.7 C) (Axillary)   Resp 16   Ht 6' (1.829 m)   Wt 78.3 kg Comment: weighed 3 xs to verify  SpO2 99%   BMI 23.41 kg/m  Opens eyes, intubated Following commands Moving all extremities purposefully As sedation is lightened his exam improves Is being weaned on ventilator Doing well

## 2019-07-30 ENCOUNTER — Inpatient Hospital Stay (HOSPITAL_COMMUNITY): Payer: 59

## 2019-07-30 LAB — BASIC METABOLIC PANEL
Anion gap: 8 (ref 5–15)
BUN: 18 mg/dL (ref 8–23)
CO2: 24 mmol/L (ref 22–32)
Calcium: 8.2 mg/dL — ABNORMAL LOW (ref 8.9–10.3)
Chloride: 111 mmol/L (ref 98–111)
Creatinine, Ser: 1.03 mg/dL (ref 0.61–1.24)
GFR calc Af Amer: >60 mL/min (ref >60)
GFR calc non Af Amer: >60 mL/min (ref >60)
Glucose, Bld: 130 mg/dL — ABNORMAL HIGH (ref 70–99)
Potassium: 3.4 mmol/L — ABNORMAL LOW (ref 3.5–5.1)
Sodium: 143 mmol/L (ref 135–145)

## 2019-07-30 LAB — GLUCOSE, CAPILLARY
Glucose-Capillary: 112 mg/dL — ABNORMAL HIGH (ref 70–99)
Glucose-Capillary: 114 mg/dL — ABNORMAL HIGH (ref 70–99)
Glucose-Capillary: 122 mg/dL — ABNORMAL HIGH (ref 70–99)
Glucose-Capillary: 109 mg/dL — ABNORMAL HIGH (ref 70–99)
Glucose-Capillary: 117 mg/dL — ABNORMAL HIGH (ref 70–99)
Glucose-Capillary: 94 mg/dL (ref 70–99)

## 2019-07-30 LAB — MAGNESIUM: Magnesium: 2.2 mg/dL (ref 1.7–2.4)

## 2019-07-30 LAB — TRIGLYCERIDES: Triglycerides: 162 mg/dL — ABNORMAL HIGH (ref <150)

## 2019-07-30 LAB — PHOSPHORUS: Phosphorus: 2.1 mg/dL — ABNORMAL LOW (ref 2.5–4.6)

## 2019-07-30 MED ORDER — POTASSIUM CHLORIDE 20 MEQ/15ML (10%) PO SOLN
40.0000 meq | Freq: Once | ORAL | Status: AC
  Administered 2019-07-30: 40 meq
  Filled 2019-07-30: qty 30
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
## 2019-07-30 NOTE — Progress Notes (Signed)
Patient ID: Gary [name], male   DOB: 1945/10/31, 74 y.o.   MRN: [id number] BP 128/72   Pulse 82   Temp 98.3 F (36.8 C) (Oral)   Resp 16   Ht 6' (1.829 m)   Wt 78.3 kg Comment: weighed 3 xs to verify  SpO2 96%   BMI 23.41 kg/m  Sedated, intubated Will continue to wean today No new recommendations

## 2019-07-31 LAB — GLUCOSE, CAPILLARY
Glucose-Capillary: 113 mg/dL — ABNORMAL HIGH (ref 70–99)
Glucose-Capillary: 108 mg/dL — ABNORMAL HIGH (ref 70–99)
Glucose-Capillary: 125 mg/dL — ABNORMAL HIGH (ref 70–99)
Glucose-Capillary: 106 mg/dL — ABNORMAL HIGH (ref 70–99)
Glucose-Capillary: 93 mg/dL (ref 70–99)
Glucose-Capillary: 131 mg/dL — ABNORMAL HIGH (ref 70–99)

## 2019-07-31 LAB — TRIGLYCERIDES: Triglycerides: 154 mg/dL — ABNORMAL HIGH (ref <150)
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
## 2019-08-01 DIAGNOSIS — Z515 Encounter for palliative care: Secondary | ICD-10-CM | POA: Diagnosis not present

## 2019-08-01 DIAGNOSIS — T1490XA Injury, unspecified, initial encounter: Secondary | ICD-10-CM | POA: Diagnosis not present

## 2019-08-01 LAB — CBC
HCT: 30.6 % — ABNORMAL LOW (ref 39.0–52.0)
Hemoglobin: 10.6 g/dL — ABNORMAL LOW (ref 13.0–17.0)
MCH: 32.8 pg (ref 26.0–34.0)
MCHC: 34.6 g/dL (ref 30.0–36.0)
MCV: 94.7 fL (ref 80.0–100.0)
Platelets: 383 10*3/uL (ref 150–400)
RBC: 3.23 MIL/uL — ABNORMAL LOW (ref 4.22–5.81)
RDW: 14.3 % (ref 11.5–15.5)
WBC: 6.7 10*3/uL (ref 4.0–10.5)
nRBC: 0 % (ref 0.0–0.2)

## 2019-08-01 LAB — TRIGLYCERIDES: Triglycerides: 135 mg/dL (ref <150)

## 2019-08-01 LAB — BASIC METABOLIC PANEL
Anion gap: 10 (ref 5–15)
BUN: 21 mg/dL (ref 8–23)
CO2: 22 mmol/L (ref 22–32)
Calcium: 8.3 mg/dL — ABNORMAL LOW (ref 8.9–10.3)
Chloride: 111 mmol/L (ref 98–111)
Creatinine, Ser: 1.03 mg/dL (ref 0.61–1.24)
GFR calc Af Amer: >60 mL/min (ref >60)
GFR calc non Af Amer: >60 mL/min (ref >60)
Glucose, Bld: 115 mg/dL — ABNORMAL HIGH (ref 70–99)
Potassium: 3.6 mmol/L (ref 3.5–5.1)
Sodium: 143 mmol/L (ref 135–145)

## 2019-08-01 LAB — GLUCOSE, CAPILLARY
Glucose-Capillary: 112 mg/dL — ABNORMAL HIGH (ref 70–99)
Glucose-Capillary: 101 mg/dL — ABNORMAL HIGH (ref 70–99)
Glucose-Capillary: 98 mg/dL (ref 70–99)
Glucose-Capillary: 98 mg/dL (ref 70–99)
Glucose-Capillary: 92 mg/dL (ref 70–99)
Glucose-Capillary: 105 mg/dL — ABNORMAL HIGH (ref 70–99)

## 2019-08-01 LAB — MAGNESIUM: Magnesium: 1.9 mg/dL (ref 1.7–2.4)

## 2019-08-01 MED ORDER — FENTANYL CITRATE (PF) 100 MCG/2ML IJ SOLN: 100.0000 ug | INTRAMUSCULAR | Status: DC | PRN

## 2019-08-01 MED ORDER — PIVOT 1.5 CAL PO LIQD
1000.0000 mL | ORAL | Status: DC
  Administered 2019-08-01 – 2019-08-04 (×4): 1000 mL
  Filled 2019-08-01 (×5): qty 1000

## 2019-08-01 MED ORDER — FENTANYL CITRATE (PF) 100 MCG/2ML IJ SOLN: 50.0000 ug | INTRAMUSCULAR | Status: DC | PRN
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
## 2019-08-01 NOTE — Progress Notes (Signed)
Patient ID: Gary [name], male   DOB: [birth year], 74 y.o.   MRN: [id number]  This NP visited patient at the bedside as a follow up for palliative medicine needs and emotional support.    [name]  at bedside.   Patient extubated this morning at 830, awake, knows his name, aware that he is in the hospital, recognizes his wife and knows her name and currently with strong cough.  Education offered regarding the ongoing medical challenges and steps to recovery.  [name] is grateful for  continued improvement and hopeful for recovery to baseline.  [name] is realistic and pragmatic and knows "it is a long haul".   Family will take it one day at a time.  Family is open to all offered and available medical interventions to prolong life.  Discussed with patient the importance of continued conversation with the medical providers regarding overall plan of care and treatment options,  ensuring decisions are within the context of the patients values and GOCs.  Questions and concerns addressed.   Encouraged patient's wife to contact palliative medicine team phone with questions or concerns.  I discussed case with Dr Grandville Silos and bedside nursing  Total time spent on the unit was 20 minutes   Greater than 50% of the time was spent in counseling and coordination of care   Wadie Lessen NP  Palliative Medicine Team Team Phone # [phone] Pager [phone]
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
## 2019-08-01 NOTE — Procedures (Signed)
Extubation Procedure Note  Patient Details:   Name: Demere Dotzler DOB: [birth year] MRN: [id number]   Airway Documentation:    Vent end date: 08/01/19 Vent end time: 0825   Evaluation  O2 sats: stable throughout Complications: No apparent complications Patient did tolerate procedure well. Bilateral Breath Sounds: Clear, Diminished   No   RT extubated patient to 4L [place] per MD order with RN at bedside. Positive cuff leak noted. No stridor noted at this time. RT will continue to monitor as needed.   Fabiola Backer 08/01/2019, 8:27 AM

## 2019-08-02 DIAGNOSIS — S065X9A Traumatic subdural hemorrhage with loss of consciousness of unspecified duration, initial encounter: Secondary | ICD-10-CM

## 2019-08-02 LAB — GLUCOSE, CAPILLARY
Glucose-Capillary: 103 mg/dL — ABNORMAL HIGH (ref 70–99)
Glucose-Capillary: 110 mg/dL — ABNORMAL HIGH (ref 70–99)
Glucose-Capillary: 115 mg/dL — ABNORMAL HIGH (ref 70–99)
Glucose-Capillary: 98 mg/dL (ref 70–99)
Glucose-Capillary: 95 mg/dL (ref 70–99)
Glucose-Capillary: 92 mg/dL (ref 70–99)

## 2019-08-02 MED ORDER — ORAL CARE MOUTH RINSE
15.0000 mL | Freq: Two times a day (BID) | OROMUCOSAL | Status: DC
  Administered 2019-08-02 – 2019-08-11 (×18): 15 mL via OROMUCOSAL

## 2019-08-02 MED ORDER — CLONAZEPAM 0.25 MG PO TBDP
0.2500 mg | ORAL_TABLET | Freq: Two times a day (BID) | ORAL | Status: DC
  Administered 2019-08-02 – 2019-08-08 (×12): 0.25 mg
  Filled 2019-08-02 (×13): qty 1

## 2019-08-02 MED ORDER — WHITE PETROLATUM EX OINT
TOPICAL_OINTMENT | CUTANEOUS | Status: AC
  Filled 2019-08-02: qty 28.35
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
## 2019-08-02 NOTE — Progress Notes (Signed)
Patient ID: Gary [name], male   DOB: Oct 25, 1945, 74 y.o.   MRN: [id number] BP (!) 134/72 (BP Location: Left Arm)   Pulse 67   Temp 98.4 F (36.9 C) (Oral)   Resp 22   Ht 6' (1.829 m)   Wt 74.1 kg   SpO2 94%   BMI 22.16 kg/m  Lethargic, arouses to voice Moving all extremities Non fluent, speech is clear Improving, appropriate for rehab.
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
## 2019-08-02 NOTE — Progress Notes (Signed)
Inpatient Rehab Admissions Coordinator Note:   Per OT/SLP recommendations, pt was screened for CIR candidacy by Shann Medal, PT, DPT.  At this time we are recommending a CIR consult and I will place the order per our protocol.  Please contact me with questions.   Shann Medal, PT, DPT [phone] 08/02/19 3:21 PM
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
## 2019-08-02 NOTE — Progress Notes (Signed)
Patient arrived to 4NP. Wife is at the bed patient is resting no distress is noted at this time. I will continue to monitor.
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
## 2019-08-03 LAB — GLUCOSE, CAPILLARY
Glucose-Capillary: 114 mg/dL — ABNORMAL HIGH (ref 70–99)
Glucose-Capillary: 115 mg/dL — ABNORMAL HIGH (ref 70–99)
Glucose-Capillary: 117 mg/dL — ABNORMAL HIGH (ref 70–99)
Glucose-Capillary: 123 mg/dL — ABNORMAL HIGH (ref 70–99)
Glucose-Capillary: 99 mg/dL (ref 70–99)
Glucose-Capillary: 99 mg/dL (ref 70–99)

## 2019-08-03 MED ORDER — PANTOPRAZOLE SODIUM 40 MG IV SOLR
40.0000 mg | INTRAVENOUS | Status: DC
  Administered 2019-08-03 – 2019-08-04 (×2): 40 mg via INTRAVENOUS
  Filled 2019-08-03 (×2): qty 40

## 2019-08-03 MED ORDER — QUETIAPINE FUMARATE 50 MG PO TABS
25.0000 mg | ORAL_TABLET | Freq: Two times a day (BID) | ORAL | Status: DC
  Administered 2019-08-03: 25 mg
  Filled 2019-08-03: qty 1
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
## 2019-08-03 NOTE — TOC Progression Note (Signed)
Transition of Care [facility]) - Progression Note    Patient Details  Name: Gary [name] MRN: [id number] Date of Birth: 1945/10/12  [facility]) CM/SW Contact  Ella Bodo, RN Phone Number: 08/03/2019, 3:13 PM  Clinical Narrative: Patient progressing well with therapies; rehab consult in progress.  Faxed clinical update to Meredeth Ide, Worker's Comp. Tourist information centre manager for [facility], per her request.  Fax number [phone].    Expected Discharge Plan: IP Rehab Facility Barriers to Discharge: Continued Medical Work up  Expected Discharge Plan and Services Expected Discharge Plan: [place]   Discharge Planning Services: CM Consult   Living arrangements for the past 2 months: Single Family Home                                       Social Determinants of Health (SDOH) Interventions    Readmission Risk Interventions No flowsheet data found.  Reinaldo Raddle, RN, BSN  Trauma/Neuro ICU Case Manager [phone]
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
## 2019-08-03 NOTE — Progress Notes (Signed)
Inpatient Rehab Admissions:  Inpatient Rehab Consult received.  I met with patient and his daughter briefly at the bedside for rehabilitation assessment and to discuss goals and expectations of an inpatient rehab admission.  Pt getting settled in to rest 2/2 long morning.  I was able to speak to his wife, [name], over the phone to discuss goals/expectations.  She is on board for pt to come to CIR when medically ready and bed available.  Will need to touch base with worker's comp closer to that time.  Will follow closely.   Signed: Shann Medal, PT, DPT Admissions Coordinator [phone] 08/03/19  2:48 PM

## 2019-08-03 NOTE — Progress Notes (Signed)
Patient ID: Gary [name], male   DOB: 06/19/1945, 74 y.o.   MRN: [id number] BP (!) 151/84 (BP Location: Left Arm)   Pulse 83   Temp 97.7 F (36.5 C) (Oral)   Resp 20   Ht 6' (1.829 m)   Wt 75.3 kg   SpO2 95%   BMI 22.51 kg/m  Alert, voice raspy, confused Will follow some commands Moving all extremities Interacting with wife this afternoon. Obviously improving No new recommendations

## 2019-08-03 NOTE — Progress Notes (Signed)
SLP Cancellation Note  Patient Details Name: Gary [name] MRN: [id number] DOB: 08/23/1945   Cancelled treatment:       Reason Eval/Treat Not Completed: Other (comment) Went to see pt this afternoon. Per RN, he had just received pain meds and fallen asleep. She asked that SLP hold for now. Will f/u as able.   Osie Bond., M.A. [place] [facility] Rehabilitation Services Pager [phone] Office [phone]  08/03/2019, 2:36 PM
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
## 2019-08-04 ENCOUNTER — Inpatient Hospital Stay (HOSPITAL_COMMUNITY): Payer: 59

## 2019-08-04 LAB — GLUCOSE, CAPILLARY
Glucose-Capillary: 114 mg/dL — ABNORMAL HIGH (ref 70–99)
Glucose-Capillary: 116 mg/dL — ABNORMAL HIGH (ref 70–99)
Glucose-Capillary: 107 mg/dL — ABNORMAL HIGH (ref 70–99)
Glucose-Capillary: 103 mg/dL — ABNORMAL HIGH (ref 70–99)

## 2019-08-04 NOTE — Progress Notes (Signed)
Modified Barium Swallow Progress Note  Patient Details  Name: Gary [name] MRN: [id number] Date of Birth: 1945-07-16  Today's Date: 08/04/2019  Modified Barium Swallow completed.  Full report located under Chart Review in the Imaging Section.  Brief recommendations include the following:  Clinical Impression  MBS was completed using thin liquids via spoon, cup and straw, nectar thick liquids via cup, pureed material and dual textured solids.  The patient was unable to self feed during this exam.  He appeared more confused then during visit this AM.  He presented with an oral and pharyngeal dysphagia.  Recommend begin a pureed diet with nectar thick liquids.  He should be completely upright for all intake, take one sip at a time and alternate food with liquids to faciliate clearance of esophagus.  ST will follow during acute stay to initiate swallowing therapy.  Suspect he will improve rapidly.  He may require therapy to address swallowing after discharge.   See below for information regarding swallowing physiology   ORAL PHASE -Decreased lingual motion which led to delayed oral transit for dual textured solids. -Decreased lingual control which led to premature loss particularly given thin liquids. -Trace to mild oral residue noted.   -Slow but functional mastication of dual textured solids.  PHARYNGEAL PHASE -Decreased laryngeal elevation which led to decreased laryngeal vestibule closure. -Penetration into the laryngeal vestibule during the swallowing given cup sips of thin liquids. -Penetration into the laryngeal vestibule on first straw sip of thin liquids.  Penetration to the cords with subsequent aspiration of thin liquids via straw sips prior to the swallow.  This material was noted to be ejected from the airway at completion of the swallow.  The patient never coughed.    ESOPHAGEAL PHASE -Sweep revealed it slow to clear.     Swallow Evaluation Recommendations       SLP  Diet Recommendations: Nectar thick liquid;Dysphagia 1 (Puree) solids   Liquid Administration via: Cup;No straw   Medication Administration: Whole meds with puree   Supervision: Full assist for feeding   Compensations: Slow rate;Small sips/bites   Postural Changes: Remain semi-upright after after feeds/meals (Comment);Seated upright at 90 degrees   Oral Care Recommendations: Oral care BID   Other Recommendations: Have oral suction available   Shelly Flatten, MA, [place] [facility] SLP [phone]  Lamar Sprinkles 08/04/2019,3:05 PM

## 2019-08-04 NOTE — Progress Notes (Signed)
Patient ID: Gary [name], male   DOB: 03/15/1945, 74 y.o.   MRN: [id number] BP (!) 131/88 (BP Location: Left Arm)   Pulse 95   Temp 98.5 F (36.9 C) (Axillary)   Resp 20   Ht 6' (1.829 m)   Wt 73.7 kg   SpO2 95%   BMI 22.04 kg/m  Alert and oriented x3 Speech is clear, non fluent Following commands Moving all extremities Improved, and continuing to improve

## 2019-08-04 NOTE — Progress Notes (Signed)
Inpatient Rehab Admissions Coordinator:   Discussed pt in trauma rounds; medically ready for CIR when bed available and with worker's comp approval.  [place] with [employer] worker's comp to update and see what she needed from CIR to proceed. She states that she would have to discuss with pt's employer because they were asking about "other options" but did not elaborate.  She also asked if pt was alert and able to answer questions, and I referred her back to the trauma service for that information.  Will continue to follow.   Shann Medal, PT, DPT Admissions Coordinator [phone] 08/04/19  3:44 PM
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
## 2019-08-05 LAB — GLUCOSE, CAPILLARY
Glucose-Capillary: 101 mg/dL — ABNORMAL HIGH (ref 70–99)
Glucose-Capillary: 103 mg/dL — ABNORMAL HIGH (ref 70–99)
Glucose-Capillary: 106 mg/dL — ABNORMAL HIGH (ref 70–99)
Glucose-Capillary: 126 mg/dL — ABNORMAL HIGH (ref 70–99)
Glucose-Capillary: 131 mg/dL — ABNORMAL HIGH (ref 70–99)
Glucose-Capillary: 134 mg/dL — ABNORMAL HIGH (ref 70–99)

## 2019-08-05 MED ORDER — PROSOURCE TF PO LIQD
90.0000 mL | Freq: Three times a day (TID) | ORAL | Status: DC
  Administered 2019-08-05 – 2019-08-09 (×11): 90 mL
  Filled 2019-08-05 (×11): qty 90

## 2019-08-05 MED ORDER — PIVOT 1.5 CAL PO LIQD
1000.0000 mL | ORAL | Status: DC
  Administered 2019-08-05: 1000 mL
  Filled 2019-08-05 (×2): qty 1000

## 2019-08-05 MED ORDER — PANTOPRAZOLE SODIUM 40 MG PO PACK
40.0000 mg | PACK | Freq: Every day | ORAL | Status: DC
  Administered 2019-08-05 – 2019-08-11 (×7): 40 mg
  Filled 2019-08-05 (×6): qty 20

## 2019-08-05 MED ORDER — ENSURE ENLIVE PO LIQD
237.0000 mL | Freq: Three times a day (TID) | ORAL | Status: DC
  Administered 2019-08-05 – 2019-08-10 (×11): 237 mL via ORAL

## 2019-08-05 NOTE — Progress Notes (Signed)
SLP Cancellation Note  Patient Details Name: Gary [name] MRN: [id number] DOB: 15-Nov-1945   Cancelled treatment:        Attempted to see pt for ongoing swallowing treatment and cognitive linguistic therapy.  Pt had just worked with PT and is very lethargic. Will defer treatment this date and follow up at SLP schedule permits.   Celedonio Savage, [place], [place] Office: [phone] 08/05/2019, 11:24 AM
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
## 2019-08-05 NOTE — Progress Notes (Signed)
Patient ID: Gary [name], male   DOB: [birth year], 74 y.o.   MRN: [id number]  BP (!) 150/86 (BP Location: Left Arm)   Pulse 86   Temp 98.2 F (36.8 C) (Oral)   Resp 17   Ht 6' (1.829 m)   Wt 71.3 kg   SpO2 99%   BMI 21.32 kg/m   Alert, voice is raspy Follows commands Speech is nonfluent, difficulty concentrating, or expressing complex thoughts Continuing to improve

## 2019-08-05 NOTE — Progress Notes (Signed)
Stopped tube feeding during day per PA, nocturnal tube feeding to restart at 1900.
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
## 2019-08-06 ENCOUNTER — Inpatient Hospital Stay (HOSPITAL_COMMUNITY): Payer: 59

## 2019-08-06 LAB — GLUCOSE, CAPILLARY
Glucose-Capillary: 104 mg/dL — ABNORMAL HIGH (ref 70–99)
Glucose-Capillary: 104 mg/dL — ABNORMAL HIGH (ref 70–99)
Glucose-Capillary: 107 mg/dL — ABNORMAL HIGH (ref 70–99)
Glucose-Capillary: 110 mg/dL — ABNORMAL HIGH (ref 70–99)
Glucose-Capillary: 110 mg/dL — ABNORMAL HIGH (ref 70–99)
Glucose-Capillary: 113 mg/dL — ABNORMAL HIGH (ref 70–99)
Glucose-Capillary: 156 mg/dL — ABNORMAL HIGH (ref 70–99)

## 2019-08-06 MED ORDER — METOPROLOL TARTRATE 5 MG/5ML IV SOLN
2.5000 mg | Freq: Once | INTRAVENOUS | Status: AC
  Administered 2019-08-06: 2.5 mg via INTRAVENOUS
  Filled 2019-08-06: qty 5

## 2019-08-06 MED ORDER — PIVOT 1.5 CAL PO LIQD
1000.0000 mL | ORAL | Status: DC
  Administered 2019-08-06: 1000 mL
  Filled 2019-08-06 (×5): qty 1000

## 2019-08-06 NOTE — Progress Notes (Signed)
No new issues or problems.  Patient slowly recovering.  No new recommendations.  Continue supportive care.
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
## 2019-08-07 LAB — GLUCOSE, CAPILLARY
Glucose-Capillary: 102 mg/dL — ABNORMAL HIGH (ref 70–99)
Glucose-Capillary: 105 mg/dL — ABNORMAL HIGH (ref 70–99)
Glucose-Capillary: 126 mg/dL — ABNORMAL HIGH (ref 70–99)
Glucose-Capillary: 127 mg/dL — ABNORMAL HIGH (ref 70–99)
Glucose-Capillary: 151 mg/dL — ABNORMAL HIGH (ref 70–99)
Glucose-Capillary: 183 mg/dL — ABNORMAL HIGH (ref 70–99)
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
## 2019-08-07 NOTE — Progress Notes (Signed)
Per patient's wife at around 19:30, patient was lethargic, and responding sluggishly. According patient's wife, she has watched him trending down progressively. MD was notified. CT Scan and X-ray ordered. Patient was also noted to have sustained and elevated HR. To this end, MD ordered metoprolol 2.36ml IVPush. Marland Kitchen

## 2019-08-07 NOTE — Progress Notes (Signed)
Patient ID: Gary [name], male   DOB: 1945-12-16, 74 y.o.   MRN: [id number]  BP (!) 105/51 (BP Location: Left Arm)   Pulse (!) 118   Temp 97.7 F (36.5 C) (Oral)   Resp 22   Ht 6' (1.829 m)   Wt 71.3 kg   SpO2 93%   BMI 21.32 kg/m  Lethargic, arouses to voice Moving all extremities Head CT normal, no new injuries Waxing and waning status likely to continue Head injury typically  Not related to pulse or tachycardia

## 2019-08-08 ENCOUNTER — Inpatient Hospital Stay (HOSPITAL_COMMUNITY): Payer: 59

## 2019-08-08 LAB — URINALYSIS, ROUTINE W REFLEX MICROSCOPIC
Bilirubin Urine: NEGATIVE
Glucose, UA: NEGATIVE mg/dL
Ketones, ur: NEGATIVE mg/dL
Nitrite: NEGATIVE
Protein, ur: 30 mg/dL — AB
Specific Gravity, Urine: 1.02 (ref 1.005–1.030)
pH: 5 (ref 5.0–8.0)

## 2019-08-08 LAB — BASIC METABOLIC PANEL
Anion gap: 12 (ref 5–15)
BUN: 82 mg/dL — ABNORMAL HIGH (ref 8–23)
CO2: 22 mmol/L (ref 22–32)
Calcium: 8.8 mg/dL — ABNORMAL LOW (ref 8.9–10.3)
Chloride: 106 mmol/L (ref 98–111)
Creatinine, Ser: 2.28 mg/dL — ABNORMAL HIGH (ref 0.61–1.24)
GFR calc Af Amer: 32 mL/min — ABNORMAL LOW (ref >60)
GFR calc non Af Amer: 28 mL/min — ABNORMAL LOW (ref >60)
Glucose, Bld: 131 mg/dL — ABNORMAL HIGH (ref 70–99)
Potassium: 3.7 mmol/L (ref 3.5–5.1)
Sodium: 140 mmol/L (ref 135–145)

## 2019-08-08 LAB — CBC
HCT: 37.7 % — ABNORMAL LOW (ref 39.0–52.0)
Hemoglobin: 12.1 g/dL — ABNORMAL LOW (ref 13.0–17.0)
MCH: 29.4 pg (ref 26.0–34.0)
MCHC: 32.1 g/dL (ref 30.0–36.0)
MCV: 91.5 fL (ref 80.0–100.0)
Platelets: 353 10*3/uL (ref 150–400)
RBC: 4.12 MIL/uL — ABNORMAL LOW (ref 4.22–5.81)
RDW: 14.8 % (ref 11.5–15.5)
WBC: 20.5 10*3/uL — ABNORMAL HIGH (ref 4.0–10.5)
nRBC: 0 % (ref 0.0–0.2)

## 2019-08-08 LAB — GLUCOSE, CAPILLARY
Glucose-Capillary: 187 mg/dL — ABNORMAL HIGH (ref 70–99)
Glucose-Capillary: 100 mg/dL — ABNORMAL HIGH (ref 70–99)
Glucose-Capillary: 101 mg/dL — ABNORMAL HIGH (ref 70–99)
Glucose-Capillary: 107 mg/dL — ABNORMAL HIGH (ref 70–99)

## 2019-08-08 LAB — PHOSPHORUS: Phosphorus: 3.4 mg/dL (ref 2.5–4.6)

## 2019-08-08 LAB — MAGNESIUM: Magnesium: 2.4 mg/dL (ref 1.7–2.4)

## 2019-08-08 MED ORDER — METOPROLOL TARTRATE 5 MG/5ML IV SOLN: 5.0000 mg | Freq: Four times a day (QID) | INTRAVENOUS | Status: DC | PRN

## 2019-08-08 MED ORDER — SODIUM CHLORIDE 0.9 % IV SOLN: INTRAVENOUS | Status: DC

## 2019-08-08 MED ORDER — SODIUM CHLORIDE 0.9 % IV SOLN
2.0000 g | Freq: Three times a day (TID) | INTRAVENOUS | Status: DC
  Administered 2019-08-08: 2 g via INTRAVENOUS
  Filled 2019-08-08 (×2): qty 2

## 2019-08-08 MED ORDER — SODIUM CHLORIDE 0.9 % IV SOLN
2.0000 g | Freq: Two times a day (BID) | INTRAVENOUS | Status: DC
  Administered 2019-08-08 – 2019-08-10 (×5): 2 g via INTRAVENOUS
  Filled 2019-08-08 (×7): qty 2

## 2019-08-08 NOTE — Progress Notes (Signed)
OT Cancellation Note  Patient Details Name: Wilfrid Hyser MRN: [id number] DOB: [birth year]   Cancelled Treatment:    Reason Eval/Treat Not Completed: Patient not medically ready RN requesting therapy hold at this time. Pt with elevated WBC and febrile, decreased arousal this date as well. OT will return as time allows and pt is appropriate.   [facility] OTR/L Acute Rehabilitation Services Office: [place] 08/08/2019, 1:02 PM

## 2019-08-08 NOTE — Progress Notes (Signed)
PT Cancellation Note  Patient Details Name: Gary [name] MRN: [id number] DOB: 1945-09-20   Cancelled Treatment:    Reason Eval/Treat Not Completed: Patient not medically ready RN requesting therapy hold at this time. Pt with elevated WBC and febrile, decreased arousal this date as well. PT will return as time allows and pt is appropriate   Mason Burleigh A Jairo Bellew 08/08/2019, 1:07 PM Marisa Severin, PT, DPT Acute Rehabilitation Services Pager [phone] Office [phone]

## 2019-08-08 NOTE — Progress Notes (Signed)
SLP Cancellation Note  Patient Details Name: Gary [name] MRN: [id number] DOB: [birth year]   Cancelled treatment:       Reason Eval/Treat Not Completed: Fatigue/lethargy limiting ability to participate. Note changes in medical status as well including increased WBC, fever. Will continue to follow as able.    Osie Bond., M.A. [employer] Acute Rehabilitation Services Pager [phone] Office [phone]  08/08/2019, 1:54 PM

## 2019-08-08 NOTE — Progress Notes (Signed)
   08/08/19 0800  Assess: MEWS Score  Temp (!) 103.4 F (39.7 C)  BP (!) 114/51  Pulse Rate (!) 115  ECG Heart Rate (!) 0  Resp 22  Level of Consciousness Responds to Voice  SpO2 97 %  O2 Device Room Air  NT notified RN of [name] temp, temp was rechecked rectally and stayed at 103.4, Trauma paged. RN gave PRN rectal tylenol, and packed Pt with ice. PA at bedside. New orders placed.
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
## 2019-08-09 DIAGNOSIS — R638 Other symptoms and signs concerning food and fluid intake: Secondary | ICD-10-CM

## 2019-08-09 DIAGNOSIS — R531 Weakness: Secondary | ICD-10-CM

## 2019-08-09 LAB — BASIC METABOLIC PANEL
Anion gap: 13 (ref 5–15)
BUN: 90 mg/dL — ABNORMAL HIGH (ref 8–23)
CO2: 19 mmol/L — ABNORMAL LOW (ref 22–32)
Calcium: 8.9 mg/dL (ref 8.9–10.3)
Chloride: 111 mmol/L (ref 98–111)
Creatinine, Ser: 2.09 mg/dL — ABNORMAL HIGH (ref 0.61–1.24)
GFR calc Af Amer: 36 mL/min — ABNORMAL LOW (ref >60)
GFR calc non Af Amer: 31 mL/min — ABNORMAL LOW (ref >60)
Glucose, Bld: 103 mg/dL — ABNORMAL HIGH (ref 70–99)
Potassium: 4.3 mmol/L (ref 3.5–5.1)
Sodium: 143 mmol/L (ref 135–145)

## 2019-08-09 LAB — URINE CULTURE

## 2019-08-09 LAB — CREATININE, URINE, RANDOM: Creatinine, Urine: 112.67 mg/dL

## 2019-08-09 LAB — GLUCOSE, CAPILLARY
Glucose-Capillary: 107 mg/dL — ABNORMAL HIGH (ref 70–99)
Glucose-Capillary: 107 mg/dL — ABNORMAL HIGH (ref 70–99)
Glucose-Capillary: 109 mg/dL — ABNORMAL HIGH (ref 70–99)
Glucose-Capillary: 109 mg/dL — ABNORMAL HIGH (ref 70–99)
Glucose-Capillary: 133 mg/dL — ABNORMAL HIGH (ref 70–99)
Glucose-Capillary: 140 mg/dL — ABNORMAL HIGH (ref 70–99)
Glucose-Capillary: 83 mg/dL (ref 70–99)
Glucose-Capillary: 97 mg/dL (ref 70–99)

## 2019-08-09 LAB — CBC
HCT: 34.1 % — ABNORMAL LOW (ref 39.0–52.0)
Hemoglobin: 11.4 g/dL — ABNORMAL LOW (ref 13.0–17.0)
MCH: 30.2 pg (ref 26.0–34.0)
MCHC: 33.4 g/dL (ref 30.0–36.0)
MCV: 90.5 fL (ref 80.0–100.0)
Platelets: 377 10*3/uL (ref 150–400)
RBC: 3.77 MIL/uL — ABNORMAL LOW (ref 4.22–5.81)
RDW: 14.8 % (ref 11.5–15.5)
WBC: 10.5 10*3/uL (ref 4.0–10.5)
nRBC: 0 % (ref 0.0–0.2)

## 2019-08-09 LAB — SODIUM, URINE, RANDOM: Sodium, Ur: 32 mmol/L

## 2019-08-09 MED ORDER — PIVOT 1.5 CAL PO LIQD
1120.0000 mL | ORAL | Status: DC
  Administered 2019-08-09: 1120 mL
  Administered 2019-08-10: 1000 mL
  Administered 2019-08-10: 1120 mL
  Filled 2019-08-09 (×3): qty 2000

## 2019-08-09 MED ORDER — PROSOURCE TF PO LIQD
45.0000 mL | Freq: Three times a day (TID) | ORAL | Status: DC
  Administered 2019-08-09 – 2019-08-11 (×7): 45 mL
  Filled 2019-08-09 (×7): qty 45

## 2019-08-09 MED ORDER — CLONAZEPAM 0.125 MG PO TBDP
0.1250 mg | ORAL_TABLET | Freq: Two times a day (BID) | ORAL | Status: DC
  Administered 2019-08-09 – 2019-08-11 (×4): 0.125 mg
  Filled 2019-08-09 (×4): qty 1

## 2019-08-09 MED ORDER — TAMSULOSIN HCL 0.4 MG PO CAPS
0.4000 mg | ORAL_CAPSULE | Freq: Every day | ORAL | Status: DC
  Administered 2019-08-09 – 2019-08-11 (×3): 0.4 mg via ORAL
  Filled 2019-08-09 (×3): qty 1

## 2019-08-09 MED ORDER — LACTATED RINGERS IV BOLUS
1000.0000 mL | Freq: Once | INTRAVENOUS | Status: AC
  Administered 2019-08-09: 1000 mL via INTRAVENOUS
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
## 2019-08-09 NOTE — Progress Notes (Signed)
Patient having continuous loose brown stools since tube feedings were started this evening. This RN placed a rectal pouch at 0250.

## 2019-08-09 NOTE — Progress Notes (Signed)
Patient ID: [name], male   DOB: 1945/02/01, 74 y.o.   MRN: [id number]  This NP visited patient at the bedside as a follow up for palliative medicine needs and emotional support.    [name]  at bedside.    Today is day 18 of his hospitalization.  [place] continues to show improvements, hope is for continued team approach rehab at [facility].  Education offered regarding the ongoing medical challenges and steps to recovery.   Family is open to all offered and available medical interventions to prolong life.  Education offered on the importance of revisiting advanced directives and treatment option decisions along the course and trajectory of this trauma.  Wife is requesting to speak with unit director regarding possible visitation for patient's second daughter.  Questions and concerns addressed.   Emotional support offered.  I discussed case with Dr Grandville Silos and bedside nursing and speech Therapy/Bonnie  Total time spent on the unit was 25 minutes   Greater than 50% of the time was spent in counseling and coordination of care   Wadie Lessen NP  Palliative Medicine Team Team Phone # [phone] Pager [phone]
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
## 2019-08-09 NOTE — TOC Progression Note (Signed)
Transition of Care [facility]) - Progression Note    Patient Details  Name: Gary [name] MRN: [id number] Date of Birth: 07-18-1945  Transition of Care [facility]) CM/SW Contact  Oren Section Cleta Alberts, RN Phone Number: 08/09/2019, 3:36 PM  Clinical Narrative: Updated clinical information faxed to Worker's Comp. case manager, Meredeth Ide; fax number [phone].  Spoke with Meredeth Ide by phone; informed her that patient with likely urinary infection on IV antibiotics currently.  Rechecking urine culture today.  Worker's Comp. Tourist information centre manager states she is discussing options for rehab with patient's spouse, including possible Engineer, civil (consulting) at [facility] in [place].  This has not been approved by adjustor yet, and wife has not agreed to this facility.  Case manager aware that wife will need to agree and adjustor approve prior to referral being made.  Will follow with updates as they become available.    Expected Discharge Plan: IP Rehab Facility Barriers to Discharge: Continued Medical Work up  Expected Discharge Plan and Services Expected Discharge Plan: [place]   Discharge Planning Services: CM Consult   Living arrangements for the past 2 months: Single Family Home                                       Social Determinants of Health (SDOH) Interventions    Readmission Risk Interventions No flowsheet data found.  Reinaldo Raddle, RN, BSN  Trauma/Neuro ICU Case Manager [phone]

## 2019-08-09 NOTE — Progress Notes (Signed)
Patient ID: Gary [name], male   DOB: 1945-06-14, 74 y.o.   MRN: [id number] BP (!) 122/53 (BP Location: Left Leg)   Pulse 100   Temp 98.1 F (36.7 C) (Oral)   Resp 15   Ht 6' (1.829 m)   Wt 67.4 kg   SpO2 100%   BMI 20.15 kg/m  Lethargic. Has been talking earlier. Just went back to bed Improving slowly Continue with rehab
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
## 2019-08-10 DIAGNOSIS — R638 Other symptoms and signs concerning food and fluid intake: Secondary | ICD-10-CM

## 2019-08-10 DIAGNOSIS — R531 Weakness: Secondary | ICD-10-CM

## 2019-08-10 LAB — BASIC METABOLIC PANEL
Anion gap: 13 (ref 5–15)
BUN: 75 mg/dL — ABNORMAL HIGH (ref 8–23)
CO2: 19 mmol/L — ABNORMAL LOW (ref 22–32)
Calcium: 8.8 mg/dL — ABNORMAL LOW (ref 8.9–10.3)
Chloride: 115 mmol/L — ABNORMAL HIGH (ref 98–111)
Creatinine, Ser: 1.85 mg/dL — ABNORMAL HIGH (ref 0.61–1.24)
GFR calc Af Amer: 42 mL/min — ABNORMAL LOW (ref >60)
GFR calc non Af Amer: 36 mL/min — ABNORMAL LOW (ref >60)
Glucose, Bld: 131 mg/dL — ABNORMAL HIGH (ref 70–99)
Potassium: 3.7 mmol/L (ref 3.5–5.1)
Sodium: 147 mmol/L — ABNORMAL HIGH (ref 135–145)

## 2019-08-10 LAB — CBC
HCT: 32.8 % — ABNORMAL LOW (ref 39.0–52.0)
Hemoglobin: 10.7 g/dL — ABNORMAL LOW (ref 13.0–17.0)
MCH: 29.8 pg (ref 26.0–34.0)
MCHC: 32.6 g/dL (ref 30.0–36.0)
MCV: 91.4 fL (ref 80.0–100.0)
Platelets: 393 10*3/uL (ref 150–400)
RBC: 3.59 MIL/uL — ABNORMAL LOW (ref 4.22–5.81)
RDW: 14.7 % (ref 11.5–15.5)
WBC: 6.3 10*3/uL (ref 4.0–10.5)
nRBC: 0 % (ref 0.0–0.2)

## 2019-08-10 LAB — GLUCOSE, CAPILLARY
Glucose-Capillary: 106 mg/dL — ABNORMAL HIGH (ref 70–99)
Glucose-Capillary: 127 mg/dL — ABNORMAL HIGH (ref 70–99)
Glucose-Capillary: 98 mg/dL (ref 70–99)
Glucose-Capillary: 83 mg/dL (ref 70–99)
Glucose-Capillary: 108 mg/dL — ABNORMAL HIGH (ref 70–99)

## 2019-08-10 LAB — URINE CULTURE: Culture: <10000 — AB

## 2019-08-10 MED ORDER — RESOURCE THICKENUP CLEAR PO POWD
ORAL | Status: DC | PRN
  Filled 2019-08-10: qty 125

## 2019-08-10 MED ORDER — SODIUM CHLORIDE 0.45 % IV SOLN: INTRAVENOUS | Status: DC
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
## 2019-08-10 NOTE — Progress Notes (Signed)
Inpatient Rehab Admissions Coordinator:     Met with pt and wife at bedside.  Wife prefers [facility] so pt will be close to his providers. I let Cindy with worker's comp know, and she wanted to call the wife.  Received a voicemail back saying that employer wanted to investigate [facility].  Will continue to follow.    Shann Medal, PT, DPT Admissions Coordinator [phone] 08/10/19  2:00 PM
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
## 2019-08-10 NOTE — Progress Notes (Signed)
Patient ID: Gary [name], male   DOB: 1945-03-29, 74 y.o.   MRN: [id number] BP 140/68 (BP Location: Left Arm)   Pulse 93   Temp 97.6 F (36.4 C) (Axillary)   Resp 17   Ht 6' (1.829 m)   Wt 67.5 kg   SpO2 99%   BMI 20.18 kg/m  Arouses to mild stimulation Tells me his first name All movements highly coordinated No real change neurologically Perrl, full eom Normal shoulder shrug Symmetric facial movements

## 2019-08-10 NOTE — Progress Notes (Signed)
Patients blood sugar was 83 and the wife gave him apple sauce and apple juice but patient was refusing blood sugar stick again.
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
## 2019-08-11 ENCOUNTER — Other Ambulatory Visit: Payer: Self-pay

## 2019-08-11 ENCOUNTER — Inpatient Hospital Stay (HOSPITAL_COMMUNITY)
Admission: RE | Admit: 2019-08-11 | Discharge: 2019-09-04 | DRG: 945 | Disposition: A | Discharge status: Home / Self Care | Payer: No Typology Code available for payment source | Source: Intra-hospital | Attending: Physical Medicine & Rehabilitation | Admitting: Physical Medicine & Rehabilitation

## 2019-08-11 ENCOUNTER — Encounter (HOSPITAL_COMMUNITY): Payer: Self-pay | Admitting: Physical Medicine & Rehabilitation

## 2019-08-11 ENCOUNTER — Encounter: Payer: Self-pay | Admitting: Physician Assistant

## 2019-08-11 DIAGNOSIS — N189 Chronic kidney disease, unspecified: Secondary | ICD-10-CM | POA: Diagnosis present

## 2019-08-11 DIAGNOSIS — G478 Other sleep disorders: Secondary | ICD-10-CM | POA: Diagnosis not present

## 2019-08-11 DIAGNOSIS — N390 Urinary tract infection, site not specified: Secondary | ICD-10-CM | POA: Diagnosis present

## 2019-08-11 DIAGNOSIS — S069X3D Unspecified intracranial injury with loss of consciousness of 1 hour to 5 hours 59 minutes, subsequent encounter: Secondary | ICD-10-CM | POA: Diagnosis not present

## 2019-08-11 DIAGNOSIS — N179 Acute kidney failure, unspecified: Secondary | ICD-10-CM | POA: Diagnosis present

## 2019-08-11 DIAGNOSIS — Z6821 Body mass index (BMI) 21.0-21.9, adult: Secondary | ICD-10-CM | POA: Diagnosis not present

## 2019-08-11 DIAGNOSIS — S0240CD Maxillary fracture, right side, subsequent encounter for fracture with routine healing: Secondary | ICD-10-CM | POA: Diagnosis not present

## 2019-08-11 DIAGNOSIS — E870 Hyperosmolality and hypernatremia: Secondary | ICD-10-CM | POA: Diagnosis present

## 2019-08-11 DIAGNOSIS — K219 Gastro-esophageal reflux disease without esophagitis: Secondary | ICD-10-CM | POA: Diagnosis present

## 2019-08-11 DIAGNOSIS — S069X9S Unspecified intracranial injury with loss of consciousness of unspecified duration, sequela: Secondary | ICD-10-CM

## 2019-08-11 DIAGNOSIS — R414 Neurologic neglect syndrome: Secondary | ICD-10-CM | POA: Diagnosis present

## 2019-08-11 DIAGNOSIS — R1312 Dysphagia, oropharyngeal phase: Secondary | ICD-10-CM | POA: Diagnosis not present

## 2019-08-11 DIAGNOSIS — R4182 Altered mental status, unspecified: Secondary | ICD-10-CM | POA: Diagnosis present

## 2019-08-11 DIAGNOSIS — F900 Attention-deficit hyperactivity disorder, predominantly inattentive type: Secondary | ICD-10-CM | POA: Diagnosis present

## 2019-08-11 DIAGNOSIS — Z87891 Personal history of nicotine dependence: Secondary | ICD-10-CM

## 2019-08-11 DIAGNOSIS — R42 Dizziness and giddiness: Secondary | ICD-10-CM | POA: Diagnosis present

## 2019-08-11 DIAGNOSIS — R197 Diarrhea, unspecified: Secondary | ICD-10-CM

## 2019-08-11 DIAGNOSIS — S069X3S Unspecified intracranial injury with loss of consciousness of 1 hour to 5 hours 59 minutes, sequela: Secondary | ICD-10-CM

## 2019-08-11 DIAGNOSIS — F909 Attention-deficit hyperactivity disorder, unspecified type: Secondary | ICD-10-CM | POA: Diagnosis present

## 2019-08-11 DIAGNOSIS — S069X0D Unspecified intracranial injury without loss of consciousness, subsequent encounter: Secondary | ICD-10-CM | POA: Diagnosis not present

## 2019-08-11 DIAGNOSIS — Z7189 Other specified counseling: Secondary | ICD-10-CM | POA: Diagnosis not present

## 2019-08-11 DIAGNOSIS — S069X4D Unspecified intracranial injury with loss of consciousness of 6 hours to 24 hours, subsequent encounter: Secondary | ICD-10-CM | POA: Diagnosis not present

## 2019-08-11 DIAGNOSIS — R1310 Dysphagia, unspecified: Secondary | ICD-10-CM | POA: Diagnosis present

## 2019-08-11 DIAGNOSIS — H532 Diplopia: Secondary | ICD-10-CM | POA: Diagnosis present

## 2019-08-11 DIAGNOSIS — S065X9D Traumatic subdural hemorrhage with loss of consciousness of unspecified duration, subsequent encounter: Secondary | ICD-10-CM | POA: Diagnosis not present

## 2019-08-11 DIAGNOSIS — S069X3A Unspecified intracranial injury with loss of consciousness of 1 hour to 5 hours 59 minutes, initial encounter: Secondary | ICD-10-CM | POA: Diagnosis not present

## 2019-08-11 DIAGNOSIS — I129 Hypertensive chronic kidney disease with stage 1 through stage 4 chronic kidney disease, or unspecified chronic kidney disease: Secondary | ICD-10-CM | POA: Diagnosis present

## 2019-08-11 DIAGNOSIS — R41840 Attention and concentration deficit: Secondary | ICD-10-CM

## 2019-08-11 DIAGNOSIS — E871 Hypo-osmolality and hyponatremia: Secondary | ICD-10-CM | POA: Diagnosis not present

## 2019-08-11 DIAGNOSIS — E440 Moderate protein-calorie malnutrition: Secondary | ICD-10-CM | POA: Insufficient documentation

## 2019-08-11 DIAGNOSIS — S069XAA Unspecified intracranial injury with loss of consciousness status unknown, initial encounter: Secondary | ICD-10-CM

## 2019-08-11 DIAGNOSIS — Z515 Encounter for palliative care: Secondary | ICD-10-CM | POA: Diagnosis not present

## 2019-08-11 DIAGNOSIS — S069X9A Unspecified intracranial injury with loss of consciousness of unspecified duration, initial encounter: Secondary | ICD-10-CM | POA: Diagnosis present

## 2019-08-11 DIAGNOSIS — I10 Essential (primary) hypertension: Secondary | ICD-10-CM | POA: Diagnosis not present

## 2019-08-11 HISTORY — DX: Unspecified intracranial injury with loss of consciousness status unknown, initial encounter: S06.9XAA

## 2019-08-11 LAB — BASIC METABOLIC PANEL
Anion gap: 11 (ref 5–15)
BUN: 60 mg/dL — ABNORMAL HIGH (ref 8–23)
CO2: 23 mmol/L (ref 22–32)
Calcium: 9.1 mg/dL (ref 8.9–10.3)
Chloride: 114 mmol/L — ABNORMAL HIGH (ref 98–111)
Creatinine, Ser: 1.46 mg/dL — ABNORMAL HIGH (ref 0.61–1.24)
GFR calc Af Amer: 55 mL/min — ABNORMAL LOW (ref >60)
GFR calc non Af Amer: 48 mL/min — ABNORMAL LOW (ref >60)
Glucose, Bld: 123 mg/dL — ABNORMAL HIGH (ref 70–99)
Potassium: 4.3 mmol/L (ref 3.5–5.1)
Sodium: 148 mmol/L — ABNORMAL HIGH (ref 135–145)

## 2019-08-11 LAB — GLUCOSE, CAPILLARY
Glucose-Capillary: 105 mg/dL — ABNORMAL HIGH (ref 70–99)
Glucose-Capillary: 106 mg/dL — ABNORMAL HIGH (ref 70–99)
Glucose-Capillary: 110 mg/dL — ABNORMAL HIGH (ref 70–99)
Glucose-Capillary: 110 mg/dL — ABNORMAL HIGH (ref 70–99)
Glucose-Capillary: 110 mg/dL — ABNORMAL HIGH (ref 70–99)
Glucose-Capillary: 117 mg/dL — ABNORMAL HIGH (ref 70–99)

## 2019-08-11 MED ORDER — TRAZODONE HCL 50 MG PO TABS
25.0000 mg | ORAL_TABLET | Freq: Every evening | ORAL | Status: DC | PRN
  Administered 2019-08-12 – 2019-08-19 (×7): 50 mg via ORAL
  Administered 2019-08-20: 25 mg via ORAL
  Administered 2019-08-21 – 2019-08-23 (×3): 50 mg via ORAL
  Filled 2019-08-11 (×11): qty 1

## 2019-08-11 MED ORDER — ACETAMINOPHEN 325 MG PO TABS
325.0000 mg | ORAL_TABLET | ORAL | Status: DC | PRN
  Administered 2019-08-13 – 2019-09-04 (×9): 650 mg via ORAL
  Filled 2019-08-11 (×10): qty 2

## 2019-08-11 MED ORDER — ONDANSETRON HCL 4 MG/2ML IJ SOLN: 4.0000 mg | Freq: Four times a day (QID) | INTRAMUSCULAR | Status: DC | PRN

## 2019-08-11 MED ORDER — SENNOSIDES 8.8 MG/5ML PO SYRP
10.0000 mL | ORAL_SOLUTION | Freq: Every evening | ORAL | Status: DC | PRN
  Filled 2019-08-11: qty 10

## 2019-08-11 MED ORDER — RESOURCE THICKENUP CLEAR PO POWD
ORAL | Status: DC | PRN
  Filled 2019-08-11: qty 125

## 2019-08-11 MED ORDER — ENOXAPARIN SODIUM 30 MG/0.3ML ~~LOC~~ SOLN
30.0000 mg | Freq: Two times a day (BID) | SUBCUTANEOUS | Status: DC
Start: 2019-08-11 — End: 2019-08-26
  Administered 2019-08-11 – 2019-08-26 (×30): 30 mg via SUBCUTANEOUS
  Filled 2019-08-11 (×30): qty 0.3

## 2019-08-11 MED ORDER — INSULIN ASPART 100 UNIT/ML ~~LOC~~ SOLN: 0.0000 [IU] | Freq: Every day | SUBCUTANEOUS | Status: DC

## 2019-08-11 MED ORDER — ALUM & MAG HYDROXIDE-SIMETH 200-200-20 MG/5ML PO SUSP
30.0000 mL | ORAL | Status: DC | PRN
  Administered 2019-08-19: 30 mL via ORAL
  Filled 2019-08-11: qty 30

## 2019-08-11 MED ORDER — INSULIN ASPART 100 UNIT/ML ~~LOC~~ SOLN: 0.0000 [IU] | Freq: Three times a day (TID) | SUBCUTANEOUS | Status: DC

## 2019-08-11 MED ORDER — CLONAZEPAM 0.25 MG PO TBDP: 0.2500 mg | ORAL_TABLET | Freq: Two times a day (BID) | ORAL | Status: DC

## 2019-08-11 MED ORDER — AMPHETAMINE-DEXTROAMPHET ER 5 MG PO CP24: 15.0000 mg | ORAL_CAPSULE | Freq: Every day | ORAL | Status: DC | PRN

## 2019-08-11 MED ORDER — FREE WATER
200.0000 mL | Freq: Three times a day (TID) | Status: DC
  Administered 2019-08-11 (×2): 200 mL

## 2019-08-11 MED ORDER — CLONAZEPAM 0.25 MG PO TBDP
0.2500 mg | ORAL_TABLET | Freq: Two times a day (BID) | ORAL | Status: DC
  Administered 2019-08-11 – 2019-08-12 (×2): 0.25 mg
  Filled 2019-08-11 (×2): qty 1

## 2019-08-11 MED ORDER — PIVOT 1.5 CAL PO LIQD
1120.0000 mL | ORAL | Status: DC
  Administered 2019-08-11: 1120 mL
  Filled 2019-08-11 (×2): qty 2000

## 2019-08-11 MED ORDER — PROSOURCE TF PO LIQD
45.0000 mL | Freq: Three times a day (TID) | ORAL | Status: DC
  Administered 2019-08-11 – 2019-08-19 (×22): 45 mL
  Filled 2019-08-11 (×24): qty 45

## 2019-08-11 MED ORDER — PANTOPRAZOLE SODIUM 40 MG PO PACK
40.0000 mg | PACK | Freq: Every day | ORAL | Status: DC
  Administered 2019-08-12 – 2019-08-28 (×16): 40 mg
  Filled 2019-08-11 (×16): qty 20

## 2019-08-11 MED ORDER — CHLORHEXIDINE GLUCONATE 0.12% ORAL RINSE (MEDLINE KIT)
15.0000 mL | Freq: Two times a day (BID) | OROMUCOSAL | Status: DC
  Administered 2019-08-11 – 2019-08-18 (×9): 15 mL via OROMUCOSAL

## 2019-08-11 MED ORDER — ORAL CARE MOUTH RINSE
15.0000 mL | Freq: Two times a day (BID) | OROMUCOSAL | Status: DC
  Administered 2019-08-11 – 2019-09-03 (×26): 15 mL via OROMUCOSAL

## 2019-08-11 MED ORDER — CHLORHEXIDINE GLUCONATE CLOTH 2 % EX PADS: 6.0000 | MEDICATED_PAD | Freq: Every day | CUTANEOUS | Status: DC

## 2019-08-11 MED ORDER — ONDANSETRON 4 MG PO TBDP: 4.0000 mg | ORAL_TABLET | Freq: Four times a day (QID) | ORAL | Status: DC | PRN

## 2019-08-11 MED ORDER — BISACODYL 10 MG RE SUPP: 10.0000 mg | Freq: Every day | RECTAL | Status: DC | PRN

## 2019-08-11 MED ORDER — SODIUM CHLORIDE 0.9 % IV SOLN
2.0000 g | Freq: Two times a day (BID) | INTRAVENOUS | Status: AC
  Administered 2019-08-11 – 2019-08-12 (×3): 2 g via INTRAVENOUS
  Filled 2019-08-11 (×3): qty 2

## 2019-08-11 MED ORDER — GUAIFENESIN-DM 100-10 MG/5ML PO SYRP: 5.0000 mL | ORAL_SOLUTION | Freq: Four times a day (QID) | ORAL | Status: DC | PRN

## 2019-08-11 MED ORDER — OXYCODONE HCL 5 MG/5ML PO SOLN: 10.0000 mg | ORAL | Status: DC | PRN

## 2019-08-11 MED ORDER — FREE WATER
200.0000 mL | Freq: Three times a day (TID) | Status: DC
  Administered 2019-08-11 – 2019-08-15 (×15): 200 mL

## 2019-08-11 MED ORDER — DIPHENHYDRAMINE HCL 12.5 MG/5ML PO ELIX: 12.5000 mg | ORAL_SOLUTION | Freq: Four times a day (QID) | ORAL | Status: DC | PRN

## 2019-08-11 MED ORDER — HYDRALAZINE HCL 25 MG PO TABS: 25.0000 mg | ORAL_TABLET | Freq: Four times a day (QID) | ORAL | Status: DC | PRN

## 2019-08-11 MED ORDER — SODIUM CHLORIDE 0.9 % IV SOLN
2.0000 g | Freq: Two times a day (BID) | INTRAVENOUS | Status: DC
  Administered 2019-08-11: 2 g via INTRAVENOUS
  Filled 2019-08-11 (×2): qty 2

## 2019-08-11 MED ORDER — FLEET ENEMA 7-19 GM/118ML RE ENEM: 1.0000 | ENEMA | Freq: Once | RECTAL | Status: DC | PRN
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
## 2019-08-11 NOTE — TOC Transition Note (Signed)
Transition of Care [facility]) - CM/SW Discharge Note   Patient Details  Name: Gary [name] MRN: [id number] Date of Birth: 1945/02/25  [facility] CM/SW Contact:  Ella Bodo, RN Phone Number: 08/11/2019, 3:30 PM   Clinical Narrative: Asked by Elvin So. case manager, Meredeth Ide, to check bed availability with [facility]); currently they have no beds available, per Kendal Hymen, admissions coordinator with facility.  Ms. Philippa Chester notified of this, and made aware that patient is medically stable for discharge to inpatient rehab today.  [facility] inpatient rehab does have available beds; Worker's Comp. Tourist information centre manager states that patient can discharge to [facility] inpatient rehab today.  Notified attending and CIR admissions coordinator.  We will send updated clinical information to Worker's Comp. case Freight forwarder.    Final next level of care: IP Rehab Facility Barriers to Discharge: Barriers Resolved   Patient Goals and CMS Choice   CMS Medicare.gov Compare Post Acute Care list provided to:: Patient Represenative (must comment) (spouse)                        Discharge Plan and Services   Discharge Planning Services: CM Consult                                 Social Determinants of Health (SDOH) Interventions     Readmission Risk Interventions Readmission Risk Prevention Plan 08/11/2019  Transportation Screening Complete  PCP or Specialist Appt within 5-7 Days Not Complete  Not Complete comments Pt discharging to [place] Not Complete  Home Care Screening Not Completed Comments Pt discharging to CIR  Medication Review (RN CM) Complete   Reinaldo Raddle, RN, BSN  Trauma/Neuro ICU Case Manager [phone]

## 2019-08-11 NOTE — Progress Notes (Signed)
Inpatient Rehabilitation Medication Review by a Pharmacist  A complete drug regimen review was completed for this patient to identify any potential clinically significant medication issues.  Clinically significant medication issues were identified:  Yes   Type of Medication Issue Identified Description of Issue Urgent (address now) Non-Urgent (address on AM team rounds) Plan Plan Accepted by Provider? (Yes / No / Pending AM Rounds)  Drug Interaction(s) (clinically significant)       Duplicate Therapy       Allergy       No Medication Administration End Date       Incorrect Dose       Additional Drug Therapy Needed  PTA aspirin, atorvastatin, tamsulosin, ferrous gluconate, fluticasone nasal spray, magnesium, multivitamin, vit B12, vit C and vit D not yet ordered which appears appropriate considering patient's current condition.  PTA Lisinopril currently on hold.  Non-Urgent Open I-Vent Team to re-evaluate for appropriateness on when to continue PTA medications.  Pending AM Rounds  Other         For non-urgent medication issues to be resolved on team rounds tomorrow morning an open I-Vent placed and F/U Due comment left in pharmacist handoff.     Time spent performing this drug regimen review (minutes):  20   Efraim Kaufmann 08/11/2019 6:28 PM

## 2019-08-11 NOTE — Progress Notes (Signed)
Inpatient Rehab Admissions Coordinator:   I have approval from worker's comp to proceed with admit to CIR.  Trauma team states pt medically ready.  Will let pt's wife know.   Shann Medal, PT, DPT Admissions Coordinator [phone] 08/11/19  3:10 PM

## 2019-08-11 NOTE — Progress Notes (Signed)
Pharmacy Antibiotic Note  Gary [name] is a 74 y.o. male admitted on 07/21/2019 with TBI now with fevers of unknown origin.  Pharmacy has been consulted for Cefepime dosing. WBC has normalized. Fevers resolved. SCr improving. Culture negative so far.   Plan: -Continue cefepime 2 gm IV Q 12 hours. D4/5  -Monitor CBC, renal fx, cultures and clinical progress  Height: 6' (182.9 cm) Weight: 69.5 kg (153 lb 3.5 oz) IBW/kg (Calculated) : 77.6  Temp (24hrs), Avg:97.8 F (36.6 C), Min:97.4 F (36.3 C), Max:98.5 F (36.9 C)  Recent Labs  Lab 08/08/19 0848 08/09/19 0810 08/10/19 0155 08/11/19 0542  WBC 20.5* 10.5 6.3  --   CREATININE 2.28* 2.09* 1.85* 1.46*    Estimated Creatinine Clearance: 45.6 mL/min (A) (by C-G formula based on SCr of 1.46 mg/dL (H)).    Allergies  Allergen Reactions  . Shellfish Allergy Hives    Antimicrobials this admission: Cefepime 8/2 >>   Dose adjustments this admission:   Microbiology results: 8/2 Bcx >> ngtd 8/2 UCx >> negF  7/16 MRSA PCR: neg   Thank you for allowing pharmacy to be a part of this patient's care.  Albertina Parr, PharmD., BCPS, BCCCP Clinical Pharmacist Clinical phone for 08/11/19 until 3:30pm: [phone] If after 3:30pm, please refer to [facility] for unit-specific pharmacist

## 2019-08-11 NOTE — Plan of Care (Signed)
  Problem: Education: Goal: Knowledge of the prescribed therapeutic regimen 08/11/2019 1907 by Mikki Harbor, RN Outcome: Not Applicable 04/13/8889 6945 by Mikki Harbor, RN Outcome: Progressing Goal: Knowledge of disease or condition will improve 08/11/2019 1907 by Mikki Harbor, RN Outcome: Not Applicable 0/03/8880 8003 by Mikki Harbor, RN Outcome: Progressing   Problem: Clinical Measurements: Goal: Neurologic status will improve 08/11/2019 1907 by Mikki Harbor, RN Outcome: Not Applicable 04/14/1789 5056 by Mikki Harbor, RN Outcome: Progressing   Problem: Tissue Perfusion: Goal: Ability to maintain intracranial pressure will improve Outcome: Not Applicable   Problem: Respiratory: Goal: Will regain and/or maintain adequate ventilation Outcome: Not Applicable   Problem: Skin Integrity: Goal: Risk for impaired skin integrity will decrease 08/11/2019 1907 by Mikki Harbor, RN Outcome: Not Applicable 09/13/9478 1655 by Mikki Harbor, RN Outcome: Progressing Goal: Demonstration of wound healing without infection will improve Outcome: Not Applicable   Problem: Psychosocial: Goal: Ability to verbalize positive feelings about self will improve 08/11/2019 1907 by Mikki Harbor, RN Outcome: Not Applicable 03/12/4825 0786 by Mikki Harbor, RN Outcome: Progressing Goal: Ability to participate in self-care as condition permits will improve 08/11/2019 1907 by Mikki Harbor, RN Outcome: Not Applicable 07/11/4490 0100 by Mikki Harbor, RN Outcome: Progressing Goal: Ability to identify appropriate support needs will improve 08/11/2019 1907 by Mikki Harbor, RN Outcome: Not Applicable 07/07/2195 5883 by Mikki Harbor, RN Outcome: Progressing   Problem: Health Behavior/Discharge Planning: Goal: Ability to manage health-related needs will improve 08/11/2019 1907 by Mikki Harbor, RN Outcome: Not Applicable 02/11/4980 6415 by Mikki Harbor, RN Outcome: Progressing   Problem: Nutritional: Goal: Risk of aspiration will decrease 08/11/2019 1907 by Mikki Harbor, RN Outcome: Not Applicable 08/09/938 7680 by Mikki Harbor, RN Outcome: Progressing Goal: Dietary intake will improve 08/11/2019 1907 by Mikki Harbor, RN Outcome: Not Applicable 08/13/1101 1594 by Mikki Harbor, RN Outcome: Progressing   Problem: Communication: Goal: Ability to communicate needs accurately will improve 08/11/2019 1907 by Mikki Harbor, RN Outcome: Not Applicable 05/14/5927 2446 by Mikki Harbor, RN Outcome: Progressing
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
## 2019-08-12 ENCOUNTER — Inpatient Hospital Stay (HOSPITAL_COMMUNITY): Payer: Medicare Other

## 2019-08-12 ENCOUNTER — Inpatient Hospital Stay (HOSPITAL_COMMUNITY): Payer: Medicare Other | Admitting: Speech Pathology

## 2019-08-12 ENCOUNTER — Inpatient Hospital Stay (HOSPITAL_COMMUNITY): Payer: Medicare Other | Admitting: Occupational Therapy

## 2019-08-12 ENCOUNTER — Inpatient Hospital Stay (HOSPITAL_COMMUNITY): Payer: Medicare Other | Admitting: Physical Therapy

## 2019-08-12 DIAGNOSIS — R1312 Dysphagia, oropharyngeal phase: Secondary | ICD-10-CM

## 2019-08-12 DIAGNOSIS — E440 Moderate protein-calorie malnutrition: Secondary | ICD-10-CM | POA: Insufficient documentation

## 2019-08-12 DIAGNOSIS — S069X3A Unspecified intracranial injury with loss of consciousness of 1 hour to 5 hours 59 minutes, initial encounter: Secondary | ICD-10-CM

## 2019-08-12 DIAGNOSIS — E871 Hypo-osmolality and hyponatremia: Secondary | ICD-10-CM

## 2019-08-12 DIAGNOSIS — G478 Other sleep disorders: Secondary | ICD-10-CM

## 2019-08-12 LAB — CBC WITH DIFFERENTIAL/PLATELET
Abs Immature Granulocytes: 0.08 10*3/uL — ABNORMAL HIGH (ref 0.00–0.07)
Basophils Absolute: 0 10*3/uL (ref 0.0–0.1)
Basophils Relative: 1 %
Eosinophils Absolute: 0.3 10*3/uL (ref 0.0–0.5)
Eosinophils Relative: 4 %
HCT: 30.1 % — ABNORMAL LOW (ref 39.0–52.0)
Hemoglobin: 10.6 g/dL — ABNORMAL LOW (ref 13.0–17.0)
Immature Granulocytes: 1 %
Lymphocytes Relative: 14 %
Lymphs Abs: 1 10*3/uL (ref 0.7–4.0)
MCH: 33.3 pg (ref 26.0–34.0)
MCHC: 35.2 g/dL (ref 30.0–36.0)
MCV: 94.7 fL (ref 80.0–100.0)
Monocytes Absolute: 0.7 10*3/uL (ref 0.1–1.0)
Monocytes Relative: 9 %
Neutro Abs: 5.1 10*3/uL (ref 1.7–7.7)
Neutrophils Relative %: 71 %
Platelets: 413 10*3/uL — ABNORMAL HIGH (ref 150–400)
RBC: 3.18 MIL/uL — ABNORMAL LOW (ref 4.22–5.81)
RDW: 14.5 % (ref 11.5–15.5)
WBC: 7.3 10*3/uL (ref 4.0–10.5)
nRBC: 0 % (ref 0.0–0.2)

## 2019-08-12 LAB — COMPREHENSIVE METABOLIC PANEL
ALT: 35 U/L (ref 0–44)
AST: 33 U/L (ref 15–41)
Albumin: 2.3 g/dL — ABNORMAL LOW (ref 3.5–5.0)
Alkaline Phosphatase: 75 U/L (ref 38–126)
Anion gap: 12 (ref 5–15)
BUN: 50 mg/dL — ABNORMAL HIGH (ref 8–23)
CO2: 22 mmol/L (ref 22–32)
Calcium: 9.1 mg/dL (ref 8.9–10.3)
Chloride: 111 mmol/L (ref 98–111)
Creatinine, Ser: 1.48 mg/dL — ABNORMAL HIGH (ref 0.61–1.24)
GFR calc Af Amer: 54 mL/min — ABNORMAL LOW (ref >60)
GFR calc non Af Amer: 47 mL/min — ABNORMAL LOW (ref >60)
Glucose, Bld: 126 mg/dL — ABNORMAL HIGH (ref 70–99)
Potassium: 3.9 mmol/L (ref 3.5–5.1)
Sodium: 145 mmol/L (ref 135–145)
Total Bilirubin: 0.4 mg/dL (ref 0.3–1.2)
Total Protein: 6.5 g/dL (ref 6.5–8.1)

## 2019-08-12 LAB — HEMOGLOBIN A1C
Hgb A1c MFr Bld: 5.5 % (ref 4.8–5.6)
Mean Plasma Glucose: 111.15 mg/dL

## 2019-08-12 LAB — GLUCOSE, CAPILLARY
Glucose-Capillary: 127 mg/dL — ABNORMAL HIGH (ref 70–99)
Glucose-Capillary: 95 mg/dL (ref 70–99)
Glucose-Capillary: 123 mg/dL — ABNORMAL HIGH (ref 70–99)
Glucose-Capillary: 94 mg/dL (ref 70–99)
Glucose-Capillary: 104 mg/dL — ABNORMAL HIGH (ref 70–99)

## 2019-08-12 MED ORDER — PNEUMOCOCCAL VAC POLYVALENT 25 MCG/0.5ML IJ INJ
0.5000 mL | INJECTION | INTRAMUSCULAR | Status: DC
  Filled 2019-08-12 (×2): qty 0.5

## 2019-08-12 MED ORDER — AMPHETAMINE-DEXTROAMPHETAMINE 10 MG PO TABS
10.0000 mg | ORAL_TABLET | Freq: Two times a day (BID) | ORAL | Status: DC
  Administered 2019-08-12 – 2019-08-18 (×12): 10 mg via ORAL
  Filled 2019-08-12 (×12): qty 1

## 2019-08-12 MED ORDER — PIVOT 1.5 CAL PO LIQD
1020.0000 mL | ORAL | Status: DC
  Administered 2019-08-12 – 2019-08-15 (×4): 1000 mL
  Filled 2019-08-12 (×4): qty 2000

## 2019-08-12 NOTE — Care Management (Addendum)
Inpatient [facility] Individual Statement of Services  Patient Name:  Gary [name]  Date:  08/12/2019  Welcome to the [facility].  Our goal is to provide you with an individualized program based on your diagnosis and situation, designed to meet your specific needs.  With this comprehensive rehabilitation program, you will be expected to participate in at least 3 hours of rehabilitation therapies Monday-Friday, with modified therapy programming on the weekends.  Your rehabilitation program will include the following services:  Physical Therapy (PT), Occupational Therapy (OT), Speech Therapy (ST), 24 hour per day rehabilitation nursing, Therapeutic Recreaction (TR), Neuropsychology, Care Coordinator, Rehabilitation Medicine, Nutrition Services and Pharmacy Services.  Weekly team conferences will be held on Tuesdays to discuss your progress.  Your Inpatient Rehabilitation Care Coordinator will talk with you frequently to get your input and to update you on team discussions.  Team conferences with you and your family in attendance may also be held.  Expected length of stay: 20-22 days Overall anticipated outcome: Min assist  Depending on your progress and recovery, your program may change. Your Inpatient Rehabilitation Care Coordinator will coordinate services and will keep you informed of any changes. Your Inpatient Rehabilitation Care Coordinator's name and contact numbers are listed  below.  The following services may also be recommended but are not provided by the [facility]:    [place] will be made to provide these services after discharge if needed.  Arrangements include referral to agencies that provide these services.  Your insurance has been verified to be:  Worker's Comp Your primary doctor is: [facility]  Pertinent information will be shared with your doctor and your insurance company.  Inpatient Rehabilitation Care Coordinator:  Cathleen Corti [phone] or ([phone]  Information discussed with and copy given to patient by: Cristi Loron, 08/12/2019, 9:20 AM

## 2019-08-12 NOTE — Progress Notes (Signed)
Occupational Therapy Session Note  Patient Details  Name: Gary [name] MRN: [id number] Date of Birth: [birth year]  Today's Date: 08/12/2019 OT Individual Time: 0100-7121 OT Individual Time Calculation (min): 23 min   Skilled Therapeutic Interventions/Progress Updates:    Pt greeted in bed, asleep and difficult to rouse. OT tried wash cloth to face and using auditory/tactile cues to increase alertness with no improvement. Tried to facilitate [facility] during basic self care tasks including face washing, hand washing, and lotion application bedlevel. Pt with no s/s aggression, tolerated HOH but exhibited no voluntary movement due to sleepiness. Total A for stated tasks, provided him with orientation information and then donned bilateral hand mitts. Left pt with soft call bell, bed in lowest position and fall mat in place.    Therapy Documentation Precautions:  Precautions Precautions: Fall Precaution Comments: cortrak, BUE mittens on eval Restrictions Weight Bearing Restrictions: No Vital Signs: Therapy Vitals Temp: 97.8 F (36.6 C) Temp Source: Oral Pulse Rate: 84 Resp: 16 BP: (!) 144/78 Patient Position (if appropriate): Sitting Oxygen Therapy SpO2: 97 % O2 Device: Room Air Pain: no s/s pain during tx, when pt was asked about pain he did not respond due to sleepiness  ADL:       Therapy/Group: Individual Therapy  Roger Fasnacht A Kataleya Zaugg 08/12/2019, 3:58 PM
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
## 2019-08-12 NOTE — Progress Notes (Signed)
Patient information reviewed and entered into [facility] System by Becky Sopheap Basic, PPS coordinator. Information including medical coding, function ability, and quality indicators will be reviewed and updated through discharge.   
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
## 2019-08-12 NOTE — Plan of Care (Signed)
  Problem: Consults Goal: RH BRAIN INJURY PATIENT EDUCATION Description: Description: See Patient Education module for eduction specifics Outcome: Progressing Goal: Skin Care Protocol Initiated - if Braden Score 18 or less Description: If consults are not indicated, leave blank or document N/A Outcome: Progressing Goal: Nutrition Consult-if indicated Outcome: Progressing Goal: Diabetes Guidelines if Diabetic/Glucose > 140 Description: If diabetic or lab glucose is > 140 mg/dl - Initiate Diabetes/Hyperglycemia Guidelines & Document Interventions  Outcome: Progressing   Problem: RH BOWEL ELIMINATION Goal: RH STG MANAGE BOWEL WITH ASSISTANCE Description: STG Manage Bowel with min Assistance. Outcome: Progressing Goal: RH STG MANAGE BOWEL W/MEDICATION W/ASSISTANCE Description: STG Manage Bowel with Medication with mod Assistance. Outcome: Progressing   Problem: RH BLADDER ELIMINATION Goal: RH STG MANAGE BLADDER WITH ASSISTANCE Description: STG Manage Bladder With min Assistance Outcome: Progressing   Problem: RH SKIN INTEGRITY Goal: RH STG MAINTAIN SKIN INTEGRITY WITH ASSISTANCE Description: STG Maintain Skin Integrity With min Assistance. Outcome: Progressing   Problem: RH SAFETY Goal: RH STG ADHERE TO SAFETY PRECAUTIONS W/ASSISTANCE/DEVICE Description: STG Adhere to Safety Precautions With mod Assistance/Device. Outcome: Progressing Goal: RH STG DECREASED RISK OF FALL WITH ASSISTANCE Description: STG Decreased Risk of Fall With min Assistance. Outcome: Progressing   Problem: RH COGNITION-NURSING Goal: RH STG USES MEMORY AIDS/STRATEGIES W/ASSIST TO PROBLEM SOLVE Description: STG Uses Memory Aids/Strategies With min Assistance to Problem Solve. Outcome: Progressing Goal: RH STG ANTICIPATES NEEDS/CALLS FOR ASSIST W/ASSIST/CUES Description: STG Anticipates Needs/Calls for Assist With min Assistance/Cues. Outcome: Progressing   Problem: RH PAIN MANAGEMENT Goal: RH STG PAIN  MANAGED AT OR BELOW [name] PAIN GOAL Description: Pt pain will be managed at stated goal of 2 Outcome: Progressing   Problem: RH KNOWLEDGE DEFICIT BRAIN INJURY Goal: RH STG INCREASE KNOWLEDGE OF SELF CARE AFTER BRAIN INJURY Outcome: Progressing Goal: RH STG INCREASE KNOWLEDGE OF DYSPHAGIA/FLUID INTAKE Outcome: Progressing

## 2019-08-12 NOTE — Progress Notes (Signed)
Patient Details  Name: Gary [name] MRN: [id number] Date of Birth: 08/18/1945  Today's Date: 08/12/2019  Hospital Problems: Active Problems:   TBI (traumatic brain injury) [facility])  Past Medical History:  Past Medical History:  Diagnosis Date  . ADHD   . Allergy   . CKD (chronic kidney disease)   . GERD (gastroesophageal reflux disease)   . History of chickenpox   . History of diverticulitis 2007  . History of kidney stones   . HTN (hypertension)   . Hypertension   . Reflux   . Renal disorder    kidney stones   Past Surgical History:  Past Surgical History:  Procedure Laterality Date  . ORIF MANDIBULAR FRACTURE Bilateral 07/27/2019   Procedure: OPEN REDUCTION INTERNAL FIXATION (ORIF) OF COMPLEX ZYGOMATIC FRACTURE;  Surgeon: [name], DO;  Location: [place];  Service: Plastics;  Laterality: Bilateral;  2 hours, please   Social History:  reports that he has quit smoking. He has never used smokeless tobacco. He reports that he does not drink alcohol and does not use drugs.  Family / Support Systems Marital Status: Married How Long?: 34 yrs Patient Roles: Spouse, Parent Spouse/Significant Other: Gary [name] Children: Gary [name], +1 younger daughter Anticipated Caregiver: Spouse and daughers Ability/Limitations of Caregiver: Good caregiver support Caregiver Availability: 24/7 Family Dynamics: Spouse and youngest daughter live with patient. Oldest daughter lives 15 min away.  Social History Preferred language: English Religion: Jewish Cultural Background: Jewish Education: Some College Read: Yes Write: Yes Employment Status: Employed Name of Employer: [employer]. Length of Employment: 3 Return to Work Plans: Not returning to work Public relations account executive Issues: None Guardian/Conservator: No   Abuse/Neglect Abuse/Neglect Assessment Can Be Completed: Unable to assess, patient is non-responsive or altered mental status Physical Abuse:  Denies Verbal Abuse: Denies Sexual Abuse: Denies Exploitation of patient/patient's resources: Denies Self-Neglect: Denies  Emotional Status [name] affect, behavior and adjustment status: Asleep Recent Psychosocial Issues: None Psychiatric History: None Substance Abuse History: None  Patient / Family Perceptions, Expectations & Goals Pt/Family understanding of illness & functional limitations: Yes Premorbid pt/family roles/activities: Spouse manages household, and also has an Designer, television/film set business. Anticipated changes in roles/activities/participation: Spouse will become 24/7 caregiver to patient. Pt/family expectations/goals: Spouse is concerned about patient's cognition at time of discharge.  Community Resources Express Scripts: None Premorbid Home Care/DME Agencies: None Transportation available at discharge: Yes Resource referrals recommended: Neuropsychology, Support group (specify)  Discharge Planning Living Arrangements: Spouse/significant other, Children Support Systems: Spouse/significant other, Children Type of Residence: Private residence Insurance Resources:  (Worker's Comp) Museum/gallery curator Resources: Employment Museum/gallery curator Screen Referred: No Living Expenses: Education officer, community Management: Spouse Does the patient have any problems obtaining your medications?: No Home Management: Spouse manages the home. Patient/Family Preliminary Plans: Spouse and daughters are still processing everything that as happened to the patient. Care Coordinator Barriers to Discharge: Inaccessible home environment, Behavior, Home environment access/layout, Nutrition means Care Coordinator Barriers to Discharge Comments: Bedrooms on 2nd floor of home, Education on TBI management, Patient on Dys1/honey diet. Care Coordinator Anticipated Follow Up Needs: HH/OP DC Planning Additional Notes/Comments: Family education as recommended by therapy. Expected length of stay: 20-22 days  Clinical Impression I went into  room, and introduced myself to patient's spouse, explained role, and discussed discharge process. Patient was sleeping at the time I spoke with Gary [name], his wife who plans to take him home, and provide 24/7 care. She stated he will not be returning to his previous job. She verified he does have a PCP  the [facility] in [place], and the [facility] in [place]. She also verified that this is a Worker's Comp Case. She is concerned about his cognition level when it is time for discharge. She finds it frustrating, and sad when he has days that he doesn't know who she is, and I reminded her that she can bring in photos to help trigger his memory.  Gary [name] G 08/12/2019, 11:05 AM

## 2019-08-12 NOTE — Progress Notes (Signed)
Pt arrived to floor by bed. Pt assessment done and documented. Skin assessment completed with Nunzio Cobbs RN. Family at bedside. Oriented to floor and Therapy routines. Call bell in reach
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
## 2019-08-13 ENCOUNTER — Inpatient Hospital Stay (HOSPITAL_COMMUNITY): Payer: 59 | Admitting: Physical Therapy

## 2019-08-13 ENCOUNTER — Inpatient Hospital Stay (HOSPITAL_COMMUNITY): Payer: 59

## 2019-08-13 ENCOUNTER — Inpatient Hospital Stay (HOSPITAL_COMMUNITY): Payer: 59 | Admitting: Speech Pathology

## 2019-08-13 DIAGNOSIS — S069X0D Unspecified intracranial injury without loss of consciousness, subsequent encounter: Secondary | ICD-10-CM

## 2019-08-13 LAB — CULTURE, BLOOD (ROUTINE X 2)
Culture: NO GROWTH
Culture: NO GROWTH
Special Requests: ADEQUATE

## 2019-08-13 LAB — GLUCOSE, CAPILLARY
Glucose-Capillary: 129 mg/dL — ABNORMAL HIGH (ref 70–99)
Glucose-Capillary: 88 mg/dL (ref 70–99)
Glucose-Capillary: 118 mg/dL — ABNORMAL HIGH (ref 70–99)
Glucose-Capillary: 110 mg/dL — ABNORMAL HIGH (ref 70–99)

## 2019-08-13 NOTE — Plan of Care (Signed)
  Problem: RH BOWEL ELIMINATION Goal: RH STG MANAGE BOWEL WITH ASSISTANCE Description: STG Manage Bowel with min Assistance. Outcome: Not Progressing; incontinence   Problem: RH BLADDER ELIMINATION Goal: RH STG MANAGE BLADDER WITH ASSISTANCE Description: STG Manage Bladder With min Assistance Outcome: Not Progressing; incontinence   

## 2019-08-13 NOTE — Progress Notes (Signed)
Occupational Therapy Session Note  Patient Details  Name: Gary [name] MRN: [id number] Date of Birth: Oct 13, 1945  Today's Date: 08/13/2019 OT Individual Time: 1330-1400 OT Individual Time Calculation (min): 30 min    Short Term Goals: Week 1:  OT Short Term Goal 1 (Week 1): Pt will initate threading 1LE into pants OT Short Term Goal 2 (Week 1): Pt will stand pivot transfer to toilet wiht MOD A of 1 to decrease caregiver burden OT Short Term Goal 3 (Week 1): Pt will bathe UB with mod VC for sequencing OT Short Term Goal 4 (Week 1): Pt will complete UB dressing with MOD A  Skilled Therapeutic Interventions/Progress Updates:    1:1. Pt received in TIS without wife present, mits in place and call belt alarm on. Pt completes seated dynavision requring 15-20 seconds to reach for stimuli and locate it. Pt consistently sreaching to locate button directly under stimuli. Pt reporting no double vision. Pt completes seated ball toss 3x10 for bimanual coordination, attention and serial counting task. Exited session with pt seatd in TIS, tilted back, feet up on bed, belt alarm on, mits in place and RN aware/ok with location. Therapy Documentation Precautions:  Precautions Precautions: Fall Precaution Comments: cortrak, BUE mittens on eval Required Braces or Orthoses: Other Brace Other Brace: posey belt in chair Restrictions Weight Bearing Restrictions: No General:   Vital Signs:  Pain:   ADL: ADL Upper Body Bathing: Maximal assistance Where Assessed-Upper Body Bathing: Sitting at sink Lower Body Bathing: Dependent Where Assessed-Lower Body Bathing: Standing at sink, Sitting at sink Upper Body Dressing: Dependent Where Assessed-Upper Body Dressing: Sitting at sink Lower Body Dressing: Dependent (+2 sit to stand) Where Assessed-Lower Body Dressing: Sitting at sink, Standing at sink Toileting: Dependent (+2) Where Assessed-Toileting: Glass blower/designer: Dependent (+2) Toilet  Transfer Method: Arts development officer: Grab bars ADL Comments: +2 A for mobility for safety- able to sit to stand wiht MOD A, however motor planning deficits impacting safety during pivot and stand.sit transitions Vision   Perception    Praxis   Exercises:   Other Treatments:     Therapy/Group: Individual Therapy  [name] 08/13/2019, 2:01 PM
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
## 2019-08-13 NOTE — Progress Notes (Signed)
RN was called to room by OT she said patient bumped his nose while getting him up from shower. Small scratch noted. No bleeding noted at this time. Will continue to monitor.

## 2019-08-13 NOTE — Progress Notes (Signed)
Occupational Therapy Session Note  Patient Details  Name: Gary [name] MRN: [id number] Date of Birth: December 23, 1945  Today's Date: 08/13/2019 OT Individual Time: 1000-1100 OT Individual Time Calculation (min): 60 min    Short Term Goals: Week 1:  OT Short Term Goal 1 (Week 1): Pt will initate threading 1LE into pants OT Short Term Goal 2 (Week 1): Pt will stand pivot transfer to toilet wiht MOD A of 1 to decrease caregiver burden OT Short Term Goal 3 (Week 1): Pt will bathe UB with mod VC for sequencing OT Short Term Goal 4 (Week 1): Pt will complete UB dressing with MOD A  Skilled Therapeutic Interventions/Progress Updates:    1:1. Pt received in bed agreeable to OT. Pt agreeable to takeing shower with encouragement from OT and wife. Pt completes stand pivot transfer from w/c to shower with MIN A and significantly increased time for initiation including counting and VC for closed chain hand placement for transfers. Pt completes face washing and sit to stand with MIN A overall but requries total A to wash body parts despite multiple HOH attempts for pt to wash but easily internally distracted and externally distracted by ringing water out of cloth. When standing for doing peri care and buttock hygiene pt leans forward and bumps nose on vertical grab bar. Small laceration supeficially occurs. No bleeding, RN alerted and safety zone filled out. Pt reporting it doesn't hurt after dressing. Pt requires MIN A for threading head when donning shirt and total A for donning pants d/t time restraints. Exited sessionw iht pt seated in TIS, belt alarm on, wife in room and mittens donned  Therapy Documentation Precautions:  Precautions Precautions: Fall Precaution Comments: cortrak, BUE mittens on eval Required Braces or Orthoses: Other Brace Other Brace: posey belt in chair Restrictions Weight Bearing Restrictions: No General:   Vital Signs: Therapy Vitals Temp: 98.4 F (36.9 C) Pulse Rate:  86 Resp: 17 BP: (!) 146/74 Patient Position (if appropriate): Lying Oxygen Therapy SpO2: 100 % O2 Device: Room Air Pain:   ADL: ADL Upper Body Bathing: Maximal assistance Where Assessed-Upper Body Bathing: Sitting at sink Lower Body Bathing: Dependent Where Assessed-Lower Body Bathing: Standing at sink, Sitting at sink Upper Body Dressing: Dependent Where Assessed-Upper Body Dressing: Sitting at sink Lower Body Dressing: Dependent (+2 sit to stand) Where Assessed-Lower Body Dressing: Sitting at sink, Standing at sink Toileting: Dependent (+2) Where Assessed-Toileting: Glass blower/designer: Dependent (+2) Toilet Transfer Method: Arts development officer: Grab bars ADL Comments: +2 A for mobility for safety- able to sit to stand wiht MOD A, however motor planning deficits impacting safety during pivot and stand.sit transitions Vision   Perception    Praxis   Exercises:   Other Treatments:     Therapy/Group: Individual Therapy  [name] 08/13/2019, 6:48 AM

## 2019-08-13 NOTE — Progress Notes (Signed)
Speech Language Pathology Daily Session Note  Patient Details  Name: Gary [name] MRN: [id number] Date of Birth: 07-03-1945  Today's Date: 08/13/2019 SLP Individual Time: 1450-1530 SLP Individual Time Calculation (min): 40 min  Short Term Goals: Week 1: SLP Short Term Goal 1 (Week 1): Patient will consume current diet with minimal overt s/s of aspiration and Mod A verbal cues for use of swallowing compensatory strategies. SLP Short Term Goal 2 (Week 1): Patient will consume trials of nectar-thick liquids via cup without overt s/s of aspiration in 90% of trials over 2 sessions prior to upgrade. SLP Short Term Goal 3 (Week 1): Patient will initiate verbal responses in 50% of opportunities with Max A multimodal cues. SLP Short Term Goal 4 (Week 1): Patient will follow 1-step commands in 50% of opportunities with Max A multimodal cues. SLP Short Term Goal 5 (Week 1): Patient will demonstrate sustained attention to functional tasks for 2 minutes with Max A multimodal cues. SLP Short Term Goal 6 (Week 1): Patient will utilize a sign for orientation to place with Max A multimodal cues.  Skilled Therapeutic Interventions:  Pt was seen for skilled ST targeting cognitive goals. Pt was finishing lunch upon therapist's arrival.  SLP attempted to gently encourage pt to eat more as he had only eaten a magic cup and ~1oz of honey thick liquids; however, pt repeatedly declined despite multiple offers.  [name] daughter arrived shortly after session began and a great deal of time was spent discussing family photos, including labeling various family members in an attempt to get pt to initiate verbal responses and sustain his attention to tasks.  Pt was unable to identify family members from photos when asked directly, so therapist lead functional conversations about the photographs, frequently incorporating familiar names in order to facilitate prefunctional skills/attention for storage of basic biographical  information (I.e."That's a great picture of you and Gary [name] swimming at the pool.  You look like you had so much fun!" "You and Gary [name] must like to go get ice cream at [employer].") Pt remained largely disengaged from conversations but was attentive enough at one point to laugh in response to humor.  Pt was left in wheelchair with daughter at bedside.  Continue per current plan of care.    Pain Pain Assessment Pain Scale: 0-10 Pain Score: 0-No pain  Therapy/Group: Individual Therapy  Gary [name], Gary [name] 08/13/2019, 4:12 PM

## 2019-08-13 NOTE — Progress Notes (Signed)
Physical Therapy Session Note  Patient Details  Name: Gary [name] MRN: [id number] Date of Birth: [birth year]  Today's Date: 08/13/2019 PT Individual Time: 0803-0859 PT Individual Time Calculation (min): 56 min   Short Term Goals: Week 1:  PT Short Term Goal 1 (Week 1): Pt will complete bed<>w/c with max assist +1. PT Short Term Goal 2 (Week 1): Pt will ambulate 20 ft with LRAD & +2 assist. PT Short Term Goal 3 (Week 1): Pt will complete bed mobility with max assist +1.  Skilled Therapeutic Interventions/Progress Updates: Pt presents sidelying in bed w/ head on pillow between the 2 rails on right of bed.  Pt reluctantly agrees to therapy, w/ encouragement.  Pt required total A for changing brief 2/2 incontinence of urine.  Pt able to bridge to remove and apply clean after pericare.  Pt required assist to thread legs through pants in supine, but then pulls up over hips w/ bridging.  Pt required mod A for sup to sit, although reaches for assist.  Pt attempted to sit up by holding LEs and rocking. Pt sat EOB w/ tactile and verbal cueing to pull shirt over head.  Pt performed sit to stand transfers w/ min A from multiple surfaces and stepped to TIS w/ min A and RW.  Pt wheeled to gym for time conservation.  Pt performed car transfer w/ mod A and increased verbal cues for safety as puts foot in first before sitting, but no LOB.  Pt also transferred w/c <> mat table w/ min to mod A and HHA, verbal cues for safety, manual assist for hand placement.  Education on safety given.  Pt returned to room and left in TIS, tilted back, chair alarm on and needs in reach.  Spouse present at conclusion.     Therapy Documentation Precautions:  Precautions Precautions: Fall Precaution Comments: cortrak, BUE mittens on eval Required Braces or Orthoses: Other Brace Other Brace: posey belt in chair Restrictions Weight Bearing Restrictions: No General:   Vital Signs:  Pain:no c/o.      Therapy/Group:  Individual Therapy  Ladoris Gene 08/13/2019, 9:06 AM
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
## 2019-08-14 ENCOUNTER — Inpatient Hospital Stay (HOSPITAL_COMMUNITY): Payer: 59

## 2019-08-14 LAB — GLUCOSE, CAPILLARY
Glucose-Capillary: 133 mg/dL — ABNORMAL HIGH (ref 70–99)
Glucose-Capillary: 105 mg/dL — ABNORMAL HIGH (ref 70–99)
Glucose-Capillary: 100 mg/dL — ABNORMAL HIGH (ref 70–99)
Glucose-Capillary: 105 mg/dL — ABNORMAL HIGH (ref 70–99)

## 2019-08-14 MED ORDER — STARCH (THICKENING) PO POWD: ORAL | Status: DC | PRN

## 2019-08-14 NOTE — Plan of Care (Signed)
  Problem: RH BLADDER ELIMINATION Goal: RH STG MANAGE BLADDER WITH ASSISTANCE Description: STG Manage Bladder With min Assistance Outcome: Not Progressing; incontinence   Problem: RH SAFETY Goal: RH STG ADHERE TO SAFETY PRECAUTIONS W/ASSISTANCE/DEVICE Description: STG Adhere to Safety Precautions With mod Assistance/Device. Outcome: Not Progressing; telesitter   

## 2019-08-14 NOTE — Progress Notes (Signed)
Wife tearful at shift change, "I'm just tired." Brought patient to nurse's station, providing 1:1 supervision. Pivot infusing via cortrak without problems. PRN tylenol and trazodone given at 2130. Removes mittens. Hitting at staff when providing personal care.Requires repositioning frequently, to maintain head of bed at 30 degrees. 2 assist for safety. Patrici Ranks A

## 2019-08-14 NOTE — Progress Notes (Signed)
Per wife patient is having paranoid thoughts claims he does not trust the doctors and the nurses. Family is present all though out the shift and was very helpful in calming the patient.
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
## 2019-08-14 NOTE — Progress Notes (Signed)
Physical Therapy Session Note  Patient Details  Name: Gary [name] MRN: [id number] Date of Birth: Feb 17, 1945  Today's Date: 08/14/2019 PT Individual Time: 0845-1000 PT Individual Time Calculation (min): 75 min   Short Term Goals: Week 1:  PT Short Term Goal 1 (Week 1): Pt will complete bed<>w/c with max assist +1. PT Short Term Goal 2 (Week 1): Pt will ambulate 20 ft with LRAD & +2 assist. PT Short Term Goal 3 (Week 1): Pt will complete bed mobility with max assist +1.  Skilled Therapeutic Interventions/Progress Updates:     Patient in TIS w/c with seat belt alarm set watching TV upon PT arrival. Patient alert and agreeable to PT session. Patient reported mild-moderate sore throat during session, RN and MD made aware. PT provided repositioning, rest breaks, and distraction as pain interventions throughout session.   Patient presents with dysmetria, hypometria, delayed initiation, internal and external distractibility (reaching for objects, unable to attend to task or conversation), decreased balance and safety awareness with poor proprioception of UEs and LEs with mild scissoring gait during session.  Focused session on orientation, communication, functional mobility, and patient and caregiver education.   Patient remains hypophonic with speech. Discussed job history, is retired [employer] before joining the [employer], provided details about living in [place], unable to state what city or state we are in now. Provided details about his granddaughter and taking her to the water park. All details confirmed and accurate per patient's wife upon her arrival during session.  Patient requested coffee during session, patient's wife brought in ice coffee and thickened it appropriately to honey thick. Educated on removing ice to prevent thinning of liquid as it melts. Patient took small sips through a straw with full supervision with no signs of aspiration throughout.   RN provided  morning medications during session, PT provided distraction and educated staff of reducing the number of people in the room and using distractions during activities that may be aggravating to the patient. Also, explained what the RN was doing throughout and patient remained calm without signs of agitation.   MD rounded during session, updated MD on patient's deficits and communication this morning during assessment.   Mobility: Transfers: Patient performed sit to/from stand x1 and a toilet transfer x1 with min A for steadying assist, patient reaching out for support, undershoots with reach for stabilizing objects. Provided verbal cues for erect posture and HHA rather than reaching due to poor proprioception. Patient sat on the toilet for an increased time. PT doffed non-skid socks, as they are too big for his feet, and donned B socks and tennis shoes with patient on the toilet. Patient unsuccessful with voiding at this time. Re-oriented patient to task and provided increased time without distraction. Patient still unable to void and requesting to stand. Performed LB clothing management with min A and mod A for balance due to patient letting go of the grab bar to assist with pulling up his pants.   Gait Training:  Patient ambulated ~15 feet to/from the bathrrom using HHA with min-mod A. Ambulated with significantly decreased speed, intermittent scissoring and decreased BOS, variable foot placement, decreased attention (reaching out to pick up things off the floor or straighten the bed covers), and forward trunk lean (increased with fatigue). Provided verbal cues for erect posture, use of HHA throughout rather than reaching for objects for stabilization, redirection to the task, and safety awareness throughout.  Discussed patient's deficits with the patient and his wife, provided general BI education on  attention, agitation, arousal, and emotional lability. Patient with ADHD at baseline, per wife's report.  Will continue to discuss with medical team about attention deficits based on medication alterations. Patient did state that he does not like being told what to do, agreed to being provided options and making a selection instead, will pass on to team and address in behavior plan meeting tomorrow.   Patient in TIW w/c with his wife in the room at end of session with breaks locked, seat belt alarm set, and all needs within reach.    Therapy Documentation Precautions:  Precautions Precautions: Fall Precaution Comments: cortrak, BUE mittens on eval Required Braces or Orthoses: Other Brace Other Brace: posey belt in chair Restrictions Weight Bearing Restrictions: No   Therapy/Group: Individual Therapy  Neizan Debruhl L Christophr Calix PT, DPT  08/14/2019, 3:58 PM

## 2019-08-15 ENCOUNTER — Inpatient Hospital Stay (HOSPITAL_COMMUNITY): Payer: 59

## 2019-08-15 ENCOUNTER — Inpatient Hospital Stay (HOSPITAL_COMMUNITY): Payer: 59 | Admitting: Speech Pathology

## 2019-08-15 DIAGNOSIS — S069X3D Unspecified intracranial injury with loss of consciousness of 1 hour to 5 hours 59 minutes, subsequent encounter: Secondary | ICD-10-CM

## 2019-08-15 LAB — BASIC METABOLIC PANEL
Anion gap: 10 (ref 5–15)
BUN: 59 mg/dL — ABNORMAL HIGH (ref 8–23)
CO2: 24 mmol/L (ref 22–32)
Calcium: 9.2 mg/dL (ref 8.9–10.3)
Chloride: 106 mmol/L (ref 98–111)
Creatinine, Ser: 1.44 mg/dL — ABNORMAL HIGH (ref 0.61–1.24)
GFR calc Af Amer: 56 mL/min — ABNORMAL LOW (ref >60)
GFR calc non Af Amer: 49 mL/min — ABNORMAL LOW (ref >60)
Glucose, Bld: 99 mg/dL (ref 70–99)
Potassium: 4 mmol/L (ref 3.5–5.1)
Sodium: 140 mmol/L (ref 135–145)

## 2019-08-15 LAB — GLUCOSE, CAPILLARY
Glucose-Capillary: 108 mg/dL — ABNORMAL HIGH (ref 70–99)
Glucose-Capillary: 101 mg/dL — ABNORMAL HIGH (ref 70–99)
Glucose-Capillary: 95 mg/dL (ref 70–99)
Glucose-Capillary: 116 mg/dL — ABNORMAL HIGH (ref 70–99)

## 2019-08-15 LAB — CBC
HCT: 33.8 % — ABNORMAL LOW (ref 39.0–52.0)
Hemoglobin: 11 g/dL — ABNORMAL LOW (ref 13.0–17.0)
MCH: 29.8 pg (ref 26.0–34.0)
MCHC: 32.5 g/dL (ref 30.0–36.0)
MCV: 91.6 fL (ref 80.0–100.0)
Platelets: 496 10*3/uL — ABNORMAL HIGH (ref 150–400)
RBC: 3.69 MIL/uL — ABNORMAL LOW (ref 4.22–5.81)
RDW: 14.5 % (ref 11.5–15.5)
WBC: 5.4 10*3/uL (ref 4.0–10.5)
nRBC: 0.4 % — ABNORMAL HIGH (ref 0.0–0.2)

## 2019-08-15 MED ORDER — FREE WATER
250.0000 mL | Freq: Three times a day (TID) | Status: DC
  Administered 2019-08-15 – 2019-08-19 (×15): 250 mL

## 2019-08-15 NOTE — Progress Notes (Signed)
Physical Therapy Session Note  Patient Details  Name: Gary [name] MRN: [id number] Date of Birth: 06/30/1945  Today's Date: 08/15/2019 PT Individual Time: 0955-1105 PT Individual Time Calculation (min): 70 min   Short Term Goals: Week 1:  PT Short Term Goal 1 (Week 1): Pt will complete bed<>w/c with max assist +1. PT Short Term Goal 2 (Week 1): Pt will ambulate 20 ft with LRAD & +2 assist. PT Short Term Goal 3 (Week 1): Pt will complete bed mobility with max assist +1.  Skilled Therapeutic Interventions/Progress Updates:     Patient in bed asleep with his wife at bedside upon PT arrival. Patient easily aroused to verbal stimulus and agreeable to PT session. Patient denied pain during session.  Patient with flat affect throughout session. Required redirection to attend and initiate tasks throughout session. Required frequent rest breaks due to decreased activity tolerance in mild-moderately busy environment during session. Patient demonstrated mild agitation during toileting due to desire for privacy. Patient was easily redirectable with an explanation for safety concerns and PT turned away from patient during toileting for increased privacy with patient within arms reach for safety.   Therapeutic Activity: Bed Mobility: Patient performed supine to sit with mod A for trunk management due to decreased initiation. Provided verbal cues for sequencing, initiation, and attending to task throughout. Transfers: Patient performed sit to/from stand x3, stand pivot bed>TIS w/c, and a toilet transfer with min A and intermittent mod A on pivots. Utilized RW for sit-to stand x1 and toilet transfer with min A-CGA. Provided verbal cues for hand placement, reaching back to sit to control descent, and for initiation.  Gait Training:  Patient ambulated 50 feet X3 using B HHA with min A +2 x1, B UE on therapist's shoulders min A of 1 x1, and using a RW min A x1. He also ambulated 15 feet into the bathroom at  end of session. Ambulated with narrow BOS with intermittent scissoring, improved each trial until patient became fatigued, downward head gaze, decreased attention to task (unsafely scratching his leg with one foot, moving a thread on the floor with his foot), and decreased control of AD. Provided verbal cues for looking ahead, sequencing, safe use of AD, increased BOS, and redirection for attending to gait. On last trial patient had increased difficulty turning to sit in the TIS w/c, requiring >1 min and heavy cues for problem solving and motor planning turning to the chair. Maintained balance throughout with min-mod A and assist for AD throughout.  Wheelchair Mobility:  Patient was transported in the TISw/c with total A throughout session for energy conservation and time management.  Between gait trials, discussed leisure activities the patient participated in PTA. Reports that he worked out a lot at home or in the gym, lifted [employer] and ran out doors or on a treadmill. Also, plays with his granddaughter. He was unable to provide activities that he does with his granddaughter. Did select "playing at a park" from a selection of 3 options.   Patient in bathroom handed off to [place], SLP at end of session with. Educated SLP on safe assist for patient back to with TIS w/c.    Therapy Documentation Precautions:  Precautions Precautions: Fall Precaution Comments: cortrak, BUE mittens on eval Required Braces or Orthoses: Other Brace Other Brace: posey belt in chair Restrictions Weight Bearing Restrictions: No    Therapy/Group: Individual Therapy  Crews Mccollam L Oiva Dibari PT, DPT  08/15/2019, 12:12 PM

## 2019-08-15 NOTE — Progress Notes (Signed)
Speech Language Pathology Daily Session Note  Patient Details  Name: Gary [name] MRN: [id number] Date of Birth: July 15, 1945  Today's Date: 08/15/2019 SLP Individual Time: 4008-6761 SLP Individual Time Calculation (min): 41 min  Short Term Goals: Week 1: SLP Short Term Goal 1 (Week 1): Patient will consume current diet with minimal overt s/s of aspiration and Mod A verbal cues for use of swallowing compensatory strategies. SLP Short Term Goal 2 (Week 1): Patient will consume trials of nectar-thick liquids via cup without overt s/s of aspiration in 90% of trials over 2 sessions prior to upgrade. SLP Short Term Goal 3 (Week 1): Patient will initiate verbal responses in 50% of opportunities with Max A multimodal cues. SLP Short Term Goal 4 (Week 1): Patient will follow 1-step commands in 50% of opportunities with Max A multimodal cues. SLP Short Term Goal 5 (Week 1): Patient will demonstrate sustained attention to functional tasks for 2 minutes with Max A multimodal cues. SLP Short Term Goal 6 (Week 1): Patient will utilize a sign for orientation to place with Max A multimodal cues.  Skilled Therapeutic Interventions: Pt was seen for skilled ST targeting swallow and cognitive goals. Pt received from PT in restroom, attempting to have a bowel movement. Pt was continent of bowel during session. He required Max-Total A multimodal cues for sequencing, sustained attention, and initiation during this and other functional tasks. Max A verbal cueing also provided for safety awareness when ambulating with rolling walker back to his chair. Although pt unable to perform oral care despite Max encouragement and attempts to physically assist pt, he was agreeable to consume trials of nectar thick juice via straw and eat part of a chocolate magic cup. Pt was safe with intake in terms of rate and cues for initiation of self-feeding could be faded from Max to [place] during session. Pt exhibited immediate cough with  nectar in 1 out of 2 trials. He was only agreeable to sip it via straw, therefore unable to attempt cup sips for comparison this session. No overt s/sx aspiration noted with consumption of magic cup, however oral transit was generally slow. Pt left sitting upright in chair with alarm set, needs within reach, wife present to resume full supervision with magic cup. Continue per current plan of care.          Pain Pain Assessment Pain Scale: Faces Pain Score: 0-No pain Faces Pain Scale: No hurt  Therapy/Group: Individual Therapy  Arbutus Leas 08/15/2019, 12:16 PM

## 2019-08-15 NOTE — Progress Notes (Signed)
Patient's family is requesting exception to visitor policy rule, patient's wife states he does much better when family is present and it would be helpful if her daughter could come relieve her while possibly staying the night with him to help him remain calm. [name] states patient's paranoia about doctors and nurses is increasing and having his daughter there would make him more cooperative. [name] is requesting doctor call her to discuss this. Her name is [name] and her phone number is [phone]

## 2019-08-15 NOTE — Progress Notes (Signed)
Occupational Therapy Session Note  Patient Details  Name: Gary [name] MRN: [id number] Date of Birth: May 13, 1945  Today's Date: 08/15/2019 OT Individual Time: 1330-1445 OT Individual Time Calculation (min): 75 min    Short Term Goals: Week 1:  OT Short Term Goal 1 (Week 1): Pt will initate threading 1LE into pants OT Short Term Goal 2 (Week 1): Pt will stand pivot transfer to toilet wiht MOD A of 1 to decrease caregiver burden OT Short Term Goal 3 (Week 1): Pt will bathe UB with mod VC for sequencing OT Short Term Goal 4 (Week 1): Pt will complete UB dressing with MOD A  Skilled Therapeutic Interventions/Progress Updates:    Focus of session on building therapeutic rapport with pt, engagement in activity without agitation or frustration, generalized functional activity tolerance, and TBI education. Pt initially received with his wife not present and in TIS w/c. He denied pain and politely declined activity stating he would wait for his wife to come. With distraction and request to see family pictures pt was agreeable to complete sit > stand from TIS with min A. He used RW to complete functional mobility in the room with min A overall. Slight scissoring of gait and balance perturbations requiring min A to correct. Pt had one major LOB with mod A required. Pt able to stand at the window where family pictures were kept and describe family members to OT. He was able to sustain attention with min cueing, environment controlled and no major distractions present. Pt also able to stay on topic of conversation with min-mod cueing overall. He maintained standing with BUE support on RW for 30 min overall. [name] wife entered the room and was given TBI booklet. Both pt and his wife were edu on pt's current RLAS level and cognitive recovery. Discussed d/c planning with both as well. Pt reported diplopia when looking at his wife- will trial occlusion tomorrow's session. Pt was left sitting up in the TIS w/c with  his wife assisting with  Lunch. Chair alarm belt fastened.   Therapy Documentation Precautions:  Precautions Precautions: Fall Precaution Comments: cortrak, BUE mittens on eval Required Braces or Orthoses: Other Brace Other Brace: posey belt in chair Restrictions Weight Bearing Restrictions: No   Therapy/Group: Individual Therapy  Curtis Sites 08/15/2019, 6:35 AM

## 2019-08-15 NOTE — Progress Notes (Signed)
Patient ID: Gary [name], male   DOB: 01/05/1946, 74 y.o.   MRN: [id number]   Patient caseworker [name]) requesting follow up call after conference on Tuesday.   [phone]

## 2019-08-15 NOTE — Progress Notes (Signed)
Slept good. 1 attempt OOB without assistance. Incontinent of urine X 3, Continent X1. Tylenol and trazodone given at 2040. Gary [name] A

## 2019-08-15 NOTE — Plan of Care (Addendum)
Behavioral Plan   [place] Level: VII  Behavior to decrease/ eliminate:  -pt getting up on his own   Changes to environment:  -Place signs in room to call for assistance -minimize visual distractions during interventions/feeding -keep blinds open, lights on during the day and minimize noise, interruptions at night as able  Interventions: -timed toileting -sleep chart   Recommendations for interactions with patient: -tell pt what you are doing before you do it -minimize number of people in the room at one time   Attendees:  Laverle Hobby, OT, Jettie Booze, SLP, Apolinar Junes, PT, Nolon Rod, RN
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
## 2019-08-16 ENCOUNTER — Inpatient Hospital Stay (HOSPITAL_COMMUNITY): Payer: 59

## 2019-08-16 ENCOUNTER — Inpatient Hospital Stay (HOSPITAL_COMMUNITY): Payer: 59 | Admitting: Physical Therapy

## 2019-08-16 ENCOUNTER — Inpatient Hospital Stay (HOSPITAL_COMMUNITY): Payer: 59 | Admitting: Speech Pathology

## 2019-08-16 LAB — GLUCOSE, CAPILLARY
Glucose-Capillary: 90 mg/dL (ref 70–99)
Glucose-Capillary: 88 mg/dL (ref 70–99)
Glucose-Capillary: 84 mg/dL (ref 70–99)
Glucose-Capillary: 107 mg/dL — ABNORMAL HIGH (ref 70–99)

## 2019-08-16 MED ORDER — PIVOT 1.5 CAL PO LIQD: 1020.0000 mL | ORAL | Status: DC

## 2019-08-16 MED ORDER — PIVOT 1.5 CAL PO LIQD
600.0000 mL | ORAL | Status: DC
  Administered 2019-08-16 – 2019-08-17 (×2): 600 mL
  Filled 2019-08-16 (×2): qty 1000

## 2019-08-16 MED ORDER — MELATONIN 3 MG PO TABS
3.0000 mg | ORAL_TABLET | Freq: Every day | ORAL | Status: DC
  Administered 2019-08-16 – 2019-09-03 (×18): 3 mg via ORAL
  Filled 2019-08-16 (×19): qty 1

## 2019-08-16 NOTE — Progress Notes (Signed)
Occupational Therapy Session Note  Patient Details  Name: Gary [name] MRN: [id number] Date of Birth: March 31, 1945  Today's Date: 08/16/2019 OT Individual Time: 1320-1454 OT Individual Time Calculation (min): 94 min    Short Term Goals: Week 1:  OT Short Term Goal 1 (Week 1): Pt will initate threading 1LE into pants OT Short Term Goal 2 (Week 1): Pt will stand pivot transfer to toilet wiht MOD A of 1 to decrease caregiver burden OT Short Term Goal 3 (Week 1): Pt will bathe UB with mod VC for sequencing OT Short Term Goal 4 (Week 1): Pt will complete UB dressing with MOD A  Skilled Therapeutic Interventions/Progress Updates:    Pt received sitting up in the TIS with his daughter present. Pt eating lunch. Supervision provided with redirection to task occasionally, with pt getting distracted by external and internal stimuli. Pt scooping ice cream into his coffee drink and perseverating on mixing it up. Increased perseveration overall this session. Pt finished eating and reported diplopia again when looking at his daughter. Retrieved clear glasses and tape was placed on one lens to occlude. Unclear results as pt counting 1-5 when asked how many of his daughter, spoon, etc he see's. Pt did still have significant overshooting with reaching for chapstick with these occluded lens. Pt required MAX extra time for initiation this session. Significant time spent cueing multimodally for pt to get out of TIS and into standing by both OT and his daughter. Education provided throughout session to pt's daughter re TBI education, she was receptive and supportive. Also discussed today's conference. Pt eventually came into standing with min A. 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 Pt completed 300 ft of functional mobility at slow pace, with very narrow BOS and multiple balance perturbations. Min A provided throughout and pt  unable to self correct/monitor scissoring of BLE during gait despite cueing. Pt occasionally requiring manual facilitation for Rw management. Pt returned to his room with max cueing for initiation of choosing a seat. Pt also perseverative of dirt on floor as he walked, requiring OT to "squish the bugs" in order to move on. Pt was left sitting up in the recliner with all needs met, chair alarm set. Daughter still present.   Therapy Documentation Precautions:  Precautions Precautions: Fall Precaution Comments: cortrak, BUE mittens on eval Required Braces or Orthoses: Other Brace Other Brace: posey belt in chair Restrictions Weight Bearing Restrictions: No   Therapy/Group: Individual Therapy  Curtis Sites 08/16/2019, 6:48 AM

## 2019-08-16 NOTE — Progress Notes (Signed)
Physical Therapy Session Note  Patient Details  Name: Gary [name] MRN: [id number] Date of Birth: [birth year]  Today's Date: 08/16/2019 PT Individual Time: 1006-1045 Skilled PT minutes: 39 min  Short Term Goals: Week 1:  PT Short Term Goal 1 (Week 1): Pt will complete bed<>w/c with max assist +1. PT Short Term Goal 2 (Week 1): Pt will ambulate 20 ft with LRAD & +2 assist. PT Short Term Goal 3 (Week 1): Pt will complete bed mobility with max assist +1.  Skilled Therapeutic Interventions/Progress Updates:   Pt received seated in TIS with wife present. Agreeable to therapy. No complaint of pain. PT provides maxA to don socks and shoes. TIS transport to ortho gym for time management. Pt performs multiple sit to stand transfers throughout session, completing with CGA and cuing on hand placement and sequencing. In standing, pt marches in place with RW and CGA. Pt progresses to standing and performing biceps curls with 7lb weight and alternate UE support. Progression to standing with no UE support, performing biceps curls and overhead press with 7lb weight. PT provides minA stability at hips. Activity performed for UE strengthening as well as NMR for standing balance training.  Pt ambulates 71' x2 with seated rest break. Performs without AD and PT providing minA at hips and trunk for stability and upright posture. Pt requires redirection to task due to distractibility and increased postural sway when distracted.   TIS back to room. Left seated in TIS with alarm intact and all needs within reach.  Therapy Documentation Precautions:  Precautions Precautions: Fall Precaution Comments: cortrak, BUE mittens on eval Required Braces or Orthoses: Other Brace Other Brace: posey belt in chair Restrictions Weight Bearing Restrictions: No    Therapy/Group: Individual Therapy  Breck Coons 08/16/2019, 12:07 PM

## 2019-08-16 NOTE — Progress Notes (Signed)
Physical Therapy Session Note  Patient Details  Name: Gary [name] MRN: [id number] Date of Birth: 1945-02-04  Today's Date: 08/16/2019 PT Individual Time: 1624-4695 PT Individual Time Calculation (min): 30 min   Short Term Goals: Week 1:  PT Short Term Goal 1 (Week 1): Pt will complete bed<>w/c with max assist +1. PT Short Term Goal 2 (Week 1): Pt will ambulate 20 ft with LRAD & +2 assist. PT Short Term Goal 3 (Week 1): Pt will complete bed mobility with max assist +1.  Skilled Therapeutic Interventions/Progress Updates:    Pt received sitting in w/c with his wife present having just completed assisting pt with breakfast. [name] wife reports pt is tolerating current diet very well with no signs of aspiration - notified [place], [place]. Pt agreeable to therapy session. Pt speaks very quietly and is slow to initiate a task requiring increased time and cuing via counting to 3 to initiate coming to stand. Sit<>stands with CGA/min assist for steadying/balance during session.  Performed 3 gait training trails: - ~64ft using RW - demos significant anterior trunk lean, extensive scissoring gait pattern, and poor AD management - 2x with R HHA ~32ft and ~51ft with min/mod assist for balance - demos increased upright runk posture, narrow BOS but no significant scissoring as noted w/ AD, but does have slight bilateral knee flexed posture when becoming fatigued. Pt inquiring about how long his recovery will take - educated that he will likely be in CIR for a few weeks based on ELOS. Pt left seated in w/c with needs in reach, seat belt alarm on, pt's wife present, and RN present.  Therapy Documentation Precautions:  Precautions Precautions: Fall Precaution Comments: cortrak, BUE mittens on eval Required Braces or Orthoses: Other Brace Other Brace: posey belt in chair Restrictions Weight Bearing Restrictions: No  Pain:  Reports some soreness on face when donning mask but otherwise no complaints of pain.    Therapy/Group: Individual Therapy  Tawana Scale , PT, DPT, CSRS  08/16/2019, 7:52 AM

## 2019-08-16 NOTE — Progress Notes (Signed)
Patient ID: Gary [name], male   DOB: 18-Jun-1945, 74 y.o.   MRN: [id number]  SW spoke with RN/CM Meredeth Ide (p: [phone]/[phone]) to provide updates from team conference, and d/c date 8/27.  SW met with pt in room to provide above updates. SW called pt wife to provide above updates, however, SW to follow-up tomorrow as she was unavailable at the time. She reported she will be at the hospital most of tomorrow to discuss any details.   Loralee Pacas, MSW, [place] Office: [phone] Cell: [phone] Fax: [phone]
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
## 2019-08-16 NOTE — Progress Notes (Signed)
Speech Language Pathology Daily Session Note  Patient Details  Name: Gary [name] MRN: [id number] Date of Birth: 05-09-1945  Today's Date: 08/16/2019 SLP Individual Time: 1100-1159 SLP Individual Time Calculation (min): 59 min  Short Term Goals: Week 1: SLP Short Term Goal 1 (Week 1): Patient will consume current diet with minimal overt s/s of aspiration and Mod A verbal cues for use of swallowing compensatory strategies. SLP Short Term Goal 2 (Week 1): Patient will consume trials of nectar-thick liquids via cup without overt s/s of aspiration in 90% of trials over 2 sessions prior to upgrade. SLP Short Term Goal 3 (Week 1): Patient will initiate verbal responses in 50% of opportunities with Max A multimodal cues. SLP Short Term Goal 4 (Week 1): Patient will follow 1-step commands in 50% of opportunities with Max A multimodal cues. SLP Short Term Goal 5 (Week 1): Patient will demonstrate sustained attention to functional tasks for 2 minutes with Max A multimodal cues. SLP Short Term Goal 6 (Week 1): Patient will utilize a sign for orientation to place with Max A multimodal cues.  Skilled Therapeutic Interventions: Pt was seen for skilled ST targeting dysphagia and cognitive goals. Pt was independently oriented to place and situation. He required Min A cues to orient to month (initially stated it was October), Max A to recall date several times throughout session. Pt initially required Max A verbal and visual cues for functional problem solving use of call bell to request assistance and identifying situations in which he needed to do so. He demonstrated learning and recall of this information with ability to verbalize which button to press for help at end of session with only a question cue. He also verbalized understanding/insight into reason behind not being able get up alone while in hospital. Pt sustained attention for 1-2 minute intervals with Mod-Max A verbal cues for redirection throughout  session. Max A verbal and visual cues provided for following 1-step directions during a basic calendar making task. Signs for safety awareness, recall, and orientation posted in room.   Pt also accepted trials of ~4oz nectar thick juice without overt s/sx aspiration. He also consumed small snacks of Dys 2 and Dys 3 textures. Trace oral residue and mildly prolonged mastication noted with Dys 3 solids, with a verbal cue required for liquid wash for oral clearance. Dys 2 textures were consumed efficiently, but would recommend larger trial (preferably trial meal) prior to upgrade. Education regarding oral and pharyngeal swallow deficits provided for wife. Recommend pt continue Dys 1 solids for now but advance back to nectar thick liquids, given that pt's last MBSS cleared him for nectar thick liquids and bedside presentation with nectar has improved since CIR evaluation. Will need repeat instrumental prior to any further advancement of liquids - wife and pt verbalized understanding. Pt left sitting in chair with alarm set and needs within reach. Continue per current plan of care.        Pain Pain Assessment Pain Scale: 0-10 Pain Score: 0-No pain  Therapy/Group: Individual Therapy  Arbutus Leas 08/16/2019, 12:22 PM

## 2019-08-16 NOTE — Progress Notes (Signed)
Patient was noted at the beginning of the shift with his daughter present. He was in his wheelchair. She brought him a smoothie & asked about how much thickener she should use. She was under the impression that he was upgraded to nectar thick liquids but the order for honey thick had not been changed. She was assisted in thickening his drink. At the time of medication administration, patient started to swing his right hand towards this nurse. His daughter held his arms to allow the lovenox to be given. Meds were given & he c/o a headache, so tylenol was given with relief. His daughter stayed until he went to sleep. Tube feeding was infusing at the prescribed rate. At one point during the night, one of the caps was loose & his bed had tube feeding in it. He had to have a whole bed change & he was wet as well, so hygiene care was given, along with his clothing.His clothing & personal blanket were put into the washer. No acute distress noted throughout the night. He was found to be touching & winding up the tubing for the tube feeding in his hands. He was discouraged against it. Will continue to monitor & pass on to the on coming nurse
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
## 2019-08-16 NOTE — Plan of Care (Signed)
  Problem: RH Balance Goal: LTG Patient will maintain dynamic standing balance (PT) Description: LTG:  Patient will maintain dynamic standing balance with assistance during mobility activities (PT) Flowsheets (Taken 08/16/2019 0614) LTG: Pt will maintain dynamic standing balance during mobility activities with:: (Upgraded goal due to improved arousal and patient's progress with mobility.) Supervision/Verbal cueing Note: Upgraded goal due to improved arousal and patient's progress with mobility.   Problem: Sit to Stand Goal: LTG:  Patient will perform sit to stand with assistance level (PT) Description: LTG:  Patient will perform sit to stand with assistance level (PT) Flowsheets (Taken 08/16/2019 0614) LTG: PT will perform sit to stand in preparation for functional mobility with assistance level: (Upgraded goal due to improved arousal and patient's progress with mobility.) Supervision/Verbal cueing Note: Upgraded goal due to improved arousal and patient's progress with mobility.   Problem: RH Bed to Chair Transfers Goal: LTG Patient will perform bed/chair transfers w/assist (PT) Description: LTG: Patient will perform bed to chair transfers with assistance (PT). Flowsheets (Taken 08/16/2019 [phone]) LTG: Pt will perform Bed to Chair Transfers with assistance level: (Upgraded goal due to improved arousal and patient's progress with mobility.) Supervision/Verbal cueing Note: Upgraded goal due to improved arousal and patient's progress with mobility.   Problem: RH Car Transfers Goal: LTG Patient will perform car transfers with assist (PT) Description: LTG: Patient will perform car transfers with assistance (PT). Flowsheets (Taken 08/16/2019 [phone]) LTG: Pt will perform car transfers with assist:: (Upgraded goal due to improved arousal and patient's progress with mobility.) Contact Guard/Touching assist Note: Upgraded goal due to improved arousal and patient's progress with mobility.   Problem: RH  Ambulation Goal: LTG Patient will ambulate in controlled environment (PT) Description: LTG: Patient will ambulate in a controlled environment, # of feet with assistance (PT). Flowsheets Taken 08/16/2019 0614 by Apolinar Junes L, PT LTG: Pt will ambulate in controlled environ  assist needed:: (Upgraded goal due to improved arousal and patient's progress with mobility.) Contact Guard/Touching assist Taken 08/12/2019 1252 by Waunita Schooner, PT LTG: Ambulation distance in controlled environment: 150 ft with LRAD Goal: LTG Patient will ambulate in home environment (PT) Description: LTG: Patient will ambulate in home environment, # of feet with assistance (PT). Flowsheets Taken 08/16/2019 0614 by Apolinar Junes L, PT LTG: Pt will ambulate in home environ  assist needed:: (Upgraded goal due to improved arousal and patient's progress with mobility.) Contact Guard/Touching assist Taken 08/12/2019 1252 by Waunita Schooner, PT LTG: Ambulation distance in home environment: 50 ft with LRAD Note: Upgraded goal due to improved arousal and patient's progress with mobility.
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
## 2019-08-17 ENCOUNTER — Inpatient Hospital Stay (HOSPITAL_COMMUNITY): Payer: 59 | Admitting: Physical Therapy

## 2019-08-17 ENCOUNTER — Inpatient Hospital Stay (HOSPITAL_COMMUNITY): Payer: 59

## 2019-08-17 ENCOUNTER — Encounter (HOSPITAL_COMMUNITY): Payer: 59 | Admitting: Psychology

## 2019-08-17 DIAGNOSIS — S069X4D Unspecified intracranial injury with loss of consciousness of 6 hours to 24 hours, subsequent encounter: Secondary | ICD-10-CM

## 2019-08-17 LAB — GLUCOSE, CAPILLARY
Glucose-Capillary: 107 mg/dL — ABNORMAL HIGH (ref 70–99)
Glucose-Capillary: 84 mg/dL (ref 70–99)
Glucose-Capillary: 83 mg/dL (ref 70–99)
Glucose-Capillary: 112 mg/dL — ABNORMAL HIGH (ref 70–99)

## 2019-08-17 NOTE — Progress Notes (Signed)
Physical Therapy Session Note  Patient Details  Name: Gary [name] MRN: [id number] Date of Birth: 1945/10/11  Today's Date: 08/17/2019 PT Individual Time: 1000-1115 PT Individual Time Calculation (min): 75 min   Short Term Goals: Week 1:  PT Short Term Goal 1 (Week 1): Pt will complete bed<>w/c with max assist +1. PT Short Term Goal 2 (Week 1): Pt will ambulate 20 ft with LRAD & +2 assist. PT Short Term Goal 3 (Week 1): Pt will complete bed mobility with max assist +1.  Skilled Therapeutic Interventions/Progress Updates:     Patient in the bathroom with NT and his daughter in the room upon PT arrival. Patient alert and agreeable to PT session. Patient denied pain during session. Following toileting, the patient expressed frustration about requiring assistance with mobility and toileting. Discussed current deficits, including fall risk, and progress with mobility since admission. Educated on PT goals for supervision assistance with improved balance and awareness. Provided general BI education to patient and his daughter on current deficits and recovery. Patient stated understanding and was attentive throughout education.  Patient initially asking to perform mobility at a later time. Educated on importance of therapy for recovery and regaining increased independence. With encouragement patient was agreeable to mobility.   Donned B tennis shoes with total A for time management and energy conservation prior to mobility  Therapeutic Activity: Bed Mobility: Patient performed sit to supine with supervision for safety. Provided verbal cues for positioning in the bed. Transfers: Patient performed sit to/from stand x4 and stand pivot toilet>w/c with min A. Provided verbal cues for initiation and forward weight shift. Patient was continent of bowl during session. Required supervision for peri-care and max A for LB dressing during toileting.  Patient reported feeling "light headed" in sitting  following toileting. In sitting: BP 113/60, HR 93; in standing: BP 99/63, HR 98; after ambulation: BP 120/64, HR 87. No change in symptoms in standing and improved symptoms after ambulation.   Gait Training:  Patient ambulated >250 feet and 40 feet using R HHA with min A for weight shift and balance. Ambulated with variable gait speed and foot placement, mild forward trunk lean with downward head gaze at his feet, decreased attention in busier environment (main hallway), and intermittent scissoring gait. Provided verbal cues for looking ahead, pacing for improved gait speed and reduced variability, and increased BOS.  Wheelchair Mobility:  Patient was transported in the [place] w/c with total A throughout session for energy conservation and time management.  Patient required increased time for initiation and mobility due to internal and external distractions requiring min-mod cues for redirection throughout session.    Patient in bed, doffed shoes independently EOB, with Telesitter and his daughter in the room at end of session with breaks locked, bed alarm set, and all needs within reach.    Therapy Documentation Precautions:  Precautions Precautions: Fall Precaution Comments: cortrak, BUE mittens on eval Required Braces or Orthoses: Other Brace Other Brace: posey belt in chair Restrictions Weight Bearing Restrictions: No   Therapy/Group: Individual Therapy  Estefana Taylor L Kataryna Mcquilkin PT, DPT  08/17/2019, 3:30 PM

## 2019-08-17 NOTE — Progress Notes (Signed)
Patient ID: Gary [name], male   DOB: [birth year], 74 y.o.   MRN: [id number]   SW followed up with pt spoke with RN/CM Gary [name] (p: [phone]/[phone]) to provide updates from team conference, and d/c date 8/27.  SW met with pt in room to provide above updates. SW called pt wife to provide above updates, however, SW to follow-up tomorrow as she was unavailable at the time. She reported she will be at the hospital most of tomorrow to discuss any details.  Gary [name], MSW, [place] Office: [phone] Cell: [phone] Fax: [phone]
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
## 2019-08-17 NOTE — Progress Notes (Signed)
Physical Therapy Session Note  Patient Details  Name: Gary [name] MRN: [id number] Date of Birth: [birth year]  Today's Date: 08/17/2019 PT Individual Time: 6333-5456 PT Individual Time Calculation (min): 55 min   Short Term Goals: Week 1:  PT Short Term Goal 1 (Week 1): Pt will complete bed<>w/c with max assist +1. PT Short Term Goal 2 (Week 1): Pt will ambulate 20 ft with LRAD & +2 assist. PT Short Term Goal 3 (Week 1): Pt will complete bed mobility with max assist +1.   Skilled Therapeutic Interventions/Progress Updates:   Pt received supine in bed, asleep. PT and wife able to wake pt and then agreeable to PT. Supine>sit transfer with mod assist and max cues for participation. PT assisted pt to don shoes. Pt spontaneously tired shoes sitting EOB with cues to attend L shoe. Sit<>sand from EOB with pt performing count down to initiate. Gait training in hall 2x 167f and mod assist on first bout with HHA and min assist to return to room with arm over PT shoulder. While sitting EOM pt performed sorting task to locate 3 fields from 5. Unable to spontaneously sort 2 colors, but located and sort when given specific cues target [facility] returned to room and [facility] reach, lap belt in place and all needs met.  RN aware of pt position.        Therapy Documentation Precautions:  Precautions Precautions: Fall Precaution Comments: cortrak, BUE mittens on eval Required Braces or Orthoses: Other Brace Other Brace: posey belt in chair Restrictions Weight Bearing Restrictions: No Vital Signs: Therapy Vitals Temp: 98.1 F (36.7 C) Pulse Rate: 86 Resp: 16 BP: 109/60 Patient Position (if appropriate): Sitting Oxygen Therapy SpO2: 97 % O2 Device: Room Air Pain: denies Therapy/Group: Individual Therapy  ALorie Phenix8/11/2019, 5:35 PM

## 2019-08-17 NOTE — Progress Notes (Signed)
Occupational Therapy Session Note  Patient Details  Name: Gary [name] MRN: [id number] Date of Birth: November 25, 1945  Today's Date: 08/17/2019 OT Individual Time: 1300-1405 OT Individual Time Calculation (min): 65 min    Short Term Goals: Week 1:  OT Short Term Goal 1 (Week 1): Pt will initate threading 1LE into pants OT Short Term Goal 2 (Week 1): Pt will stand pivot transfer to toilet wiht MOD A of 1 to decrease caregiver burden OT Short Term Goal 3 (Week 1): Pt will bathe UB with mod VC for sequencing OT Short Term Goal 4 (Week 1): Pt will complete UB dressing with MOD A  Skilled Therapeutic Interventions/Progress Updates:    Pt received sitting in w/c with daughter present. Supervision provided as pt took a few bites of a magic cup, feeding himself with cueing for initiation. Pt finished eating and daughter left. Large mirror brought in and was set up for pt to complete functional reaching and visual scanning task. Pt completed 1/15 line bisections before handing marker back to OT and becoming internally distracted. Attempted to redirect to task but pt no longer attending whatsoever. Mirror removed and pt was wheeled to the sink. Pt cued for initiation and sustained attention to oral care task at the sink. Pt unable to motor plan nor initiate brushing teeth, resisting any attempts at [facility]. Pt agreeable to shave at the sink. Pt followed cueing to apply shaving cream to his face. He allowed OT to shave with min cueing for head turning. Pt appropriately conversing with therapist during this task, demonstrating very simple dual processing. Pt donned shirt with backward chaining and errorless learning techniques. Mod A overall. Pt was left sitting up with his wife present. All needs met.   Therapy Documentation Precautions:  Precautions Precautions: Fall Precaution Comments: cortrak, BUE mittens on eval Required Braces or Orthoses: Other Brace Other Brace: posey belt in  chair Restrictions Weight Bearing Restrictions: No  Therapy/Group: Individual Therapy  Curtis Sites 08/17/2019, 6:49 AM
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
## 2019-08-18 ENCOUNTER — Inpatient Hospital Stay (HOSPITAL_COMMUNITY): Payer: 59

## 2019-08-18 ENCOUNTER — Inpatient Hospital Stay (HOSPITAL_COMMUNITY): Payer: 59 | Admitting: Speech Pathology

## 2019-08-18 DIAGNOSIS — Z515 Encounter for palliative care: Secondary | ICD-10-CM

## 2019-08-18 LAB — GLUCOSE, CAPILLARY
Glucose-Capillary: 107 mg/dL — ABNORMAL HIGH (ref 70–99)
Glucose-Capillary: 106 mg/dL — ABNORMAL HIGH (ref 70–99)
Glucose-Capillary: 81 mg/dL (ref 70–99)
Glucose-Capillary: 101 mg/dL — ABNORMAL HIGH (ref 70–99)

## 2019-08-18 MED ORDER — AMPHETAMINE-DEXTROAMPHETAMINE 10 MG PO TABS
20.0000 mg | ORAL_TABLET | Freq: Two times a day (BID) | ORAL | Status: AC
  Administered 2019-08-18 – 2019-09-01 (×27): 20 mg via ORAL
  Filled 2019-08-18 (×31): qty 2
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
## 2019-08-18 NOTE — Progress Notes (Addendum)
Occupational Therapy Session Note  Patient Details  Name: Gary [name] MRN: [id number] Date of Birth: 18-Mar-1945  Today's Date: 08/18/2019 OT Individual Time: 1300-1400 OT Individual Time Calculation (min): 60 min    Short Term Goals: Week 1:  OT Short Term Goal 1 (Week 1): Pt will initate threading 1LE into pants OT Short Term Goal 2 (Week 1): Pt will stand pivot transfer to toilet wiht MOD A of 1 to decrease caregiver burden OT Short Term Goal 3 (Week 1): Pt will bathe UB with mod VC for sequencing OT Short Term Goal 4 (Week 1): Pt will complete UB dressing with MOD A  Skilled Therapeutic Interventions/Progress Updates:    1;1. Pt received in TIS with wife present. Pt agreeable to shower this date ambulating with min-mod HHA with VC for not reaching out to walls/furniture while walking. Pt with much improved attention, awareness and initiation this date actually able ot sequence through bathing with MAX VC instead of being distracted by running water. Pt requires MIN A to wash buttocks for standing balance only and MAX VC for sequencing/pacing bathing and dressing tasks. Pt dresses sit to stand from [facility] over toilet with VC for pacing, orientation of shirt to self and use of stool to don socks and shoes. Exited session with pt seated in bed, exit alarm on and call light tin reach  Therapy Documentation Precautions:  Precautions Precautions: Fall Precaution Comments: cortrak, BUE mittens on eval Required Braces or Orthoses: Other Brace Other Brace: posey belt in chair Restrictions Weight Bearing Restrictions: No General:   Vital Signs:   Pain:   ADL: ADL Upper Body Bathing: Maximal assistance Where Assessed-Upper Body Bathing: Sitting at sink Lower Body Bathing: Dependent Where Assessed-Lower Body Bathing: Standing at sink, Sitting at sink Upper Body Dressing: Dependent Where Assessed-Upper Body Dressing: Sitting at sink Lower Body Dressing: Dependent (+2 sit to  stand) Where Assessed-Lower Body Dressing: Sitting at sink, Standing at sink Toileting: Dependent (+2) Where Assessed-Toileting: Glass blower/designer: Dependent (+2) Toilet Transfer Method: Arts development officer: Grab bars ADL Comments: +2 A for mobility for safety- able to sit to stand wiht MOD A, however motor planning deficits impacting safety during pivot and stand.sit transitions Vision   Perception    Praxis   Exercises:   Other Treatments:     Therapy/Group: Individual Therapy  [name] 08/18/2019, 12:07 PM

## 2019-08-18 NOTE — Plan of Care (Signed)
  Problem: RH BLADDER ELIMINATION Goal: RH STG MANAGE BLADDER WITH ASSISTANCE Description: STG Manage Bladder With min Assistance Outcome: Not Progressing; incontinence   Problem: RH SAFETY Goal: RH STG ADHERE TO SAFETY PRECAUTIONS W/ASSISTANCE/DEVICE Description: STG Adhere to Safety Precautions With mod Assistance/Device. Outcome: Not Progressing; telesitter

## 2019-08-18 NOTE — Progress Notes (Signed)
Speech Language Pathology Daily Session Note  Patient Details  Name: Gary [name] MRN: [id number] Date of Birth: 1945/05/12  Today's Date: 08/18/2019 SLP Individual Time: 6659-9357 SLP Individual Time Calculation (min): 55 min  Short Term Goals: Week 1: SLP Short Term Goal 1 (Week 1): Patient will consume current diet with minimal overt s/s of aspiration and Mod A verbal cues for use of swallowing compensatory strategies. SLP Short Term Goal 2 (Week 1): Patient will consume trials of nectar-thick liquids via cup without overt s/s of aspiration in 90% of trials over 2 sessions prior to upgrade. SLP Short Term Goal 3 (Week 1): Patient will initiate verbal responses in 50% of opportunities with Max A multimodal cues. SLP Short Term Goal 4 (Week 1): Patient will follow 1-step commands in 50% of opportunities with Max A multimodal cues. SLP Short Term Goal 5 (Week 1): Patient will demonstrate sustained attention to functional tasks for 2 minutes with Max A multimodal cues. SLP Short Term Goal 6 (Week 1): Patient will utilize a sign for orientation to place with Max A multimodal cues.  Skilled Therapeutic Interventions: Skilled treatment session focused on cognitive and dysphagia goals. SLP facilitated session by providing extra time and Max A multimodal cues for patient to initiate oral care via the suction toothbrush. Min A verbal cues were also needed for cessation of task. Patient consumed trials of ice chips and thin liquids without overt s/s of aspiration but overt coughing noted with thin via cup, suspect due to piecemeal swallowing. Despite Max A multimodal cues, patient unable to utilize small, single swallows. Recommend ongoing trials with SLP.  Patient demonstrated efficient mastication with trials of Dys. 2 textures without overt s/s of aspiration. Recommend trial tray prior to upgrade. Patient demonstrates improved initiation of verbal responses but continues to be aphonic despite Max A  multimodal cues, suspect due to cognitive impairments vs true voice impairment but ongoing diagnostic treatment is needed. Patient left upright in the wheelchair with alarm on and family present. Continue with current plan of care.      Pain No/Denies Pain  Therapy/Group: Individual Therapy  Shakeitha Umbaugh 08/18/2019, 3:09 PM

## 2019-08-18 NOTE — Progress Notes (Signed)
Physical Therapy Weekly Progress Note  Patient Details  Name: Gary [name] MRN: [id number] Date of Birth: September 17, 1945  Beginning of progress report period: August 12, 2019 End of progress report period: August 18, 2019  Today's Date: 08/18/2019 PT Individual Time: 6606-3016 PT Individual Time Calculation (min): 74 min   Patient has met 3 of 3 short term goals.  Patient has made good progress this week. Arousal has improved leading to significant gains in mobility sine evaluation. He continues to present with impairments in attention, distracted both internally and externally during sessions, and requires increased time and encouragement for initiation. He currently requires supervision for bed mobility, min A-CGA for transfers, min A gait >250 ft HHA, and min A-CGA for 12 stairs using B rails. General BI education has been initiated with patient and family, with patient asking appropriate questions about his deficits and recovery this week.     Patient continues to demonstrate the following deficits muscle weakness, decreased cardiorespiratoy endurance, ataxia, decreased coordination and decreased motor planning, decreased visual acuity and decreased visual motor skills, decreased motor planning, decreased initiation, decreased attention, decreased awareness, decreased problem solving, decreased safety awareness, decreased memory and delayed processing and decreased sitting balance, decreased standing balance, decreased postural control and decreased balance strategies and therefore will continue to benefit from skilled PT intervention to increase functional independence with mobility.  Patient progressing toward long term goals..  Plan of care revisions: Upgraded long term goals this week due to patient's progress with functional mobility. .  PT Short Term Goals Week 1:  PT Short Term Goal 1 (Week 1): Pt will complete bed<>w/c with max assist +1. PT Short Term Goal 1 - Progress (Week 1): Met  PT Short Term Goal 2 (Week 1): Pt will ambulate 20 ft with LRAD & +2 assist. PT Short Term Goal 2 - Progress (Week 1): Met PT Short Term Goal 3 (Week 1): Pt will complete bed mobility with max assist +1. PT Short Term Goal 3 - Progress (Week 1): Met Week 2:  PT Short Term Goal 1 (Week 2): Patient will perform basic transfers with CGA consistently using LRAD. PT Short Term Goal 2 (Week 2): Patient will ambulate >100 feet with CGA using LRAD. PT Short Term Goal 3 (Week 2): Patient will attend to a task in standing >5 min with CGA.  Skilled Therapeutic Interventions/Progress Updates:     Patient in TIS w/c eating breakfast with his wife in the room upon PT arrival. Patient alert and agreeable to PT session. Patient denied pain during session. Reported that the pain in his throat had improved and was mild today. Patient unable to increased speaking volume with cues/encouragement, stating "I can hear myself." NP came by during session to see how the patient was progressing in rehab. Provided report with assist from patient and his wife.   Patient using his L hand to eat breakfast this morning. Reports, "I will spill it all over myself," when asked why he is not using his R hand, as he is R handed. With encouragement patient used his R hand to eat without spilling or difficulty. Patient continued taking bites using his R hand. He did reach for purred sausage with his fingers x1, easily redirected to use his spoon. Patient continues to report diplopia, reports that it splints vertically and does not disappear with either eye closed. Will continue to assess effective interventions for visual deficits.   Provided patient with his shoes and socks and instructed him to doff his non-skid  socks and put on his socks in shoes to walk. Patient compliant and performed task with set-up assist only, however required significant time and cues for initiation and attention during task. Encouraged the patient to perform  tasks independently to increased overall independence with mobility and ADLs. Patient agreed that this is in line with his goals.  Therapeutic Activity: Transfers: Patient performed sit to/from stand x2 with CGA-close supervision. Provided verbal cues for scooting forward to stand and reaching back to sit for safety.  Gait Training:  Patient ambulated 185 feet x2 using R HHA with min A-CGA. Ambulated with variable foot placement, narrow BOS, with scissoring gait less frequent today, improved weight shifts with only intermittent facilitation, and downward head gaze, preferring to look at where he was putting his feet. Provided verbal cues for increased BOS, increased R step height with fatigue, and watching his feet for several steps then looking ahead for several steps to assess retention of corrected gait deviation without visual feedback. Patient ascended/descended 12 steps using B rails with min A-CGA. Performed reciprocal gait pattern while ascending and step-to gait pattern while descending. Provided cues for technique and sequencing.   Patient was appropriate and conversive throughout session. Had several questions about deficits and recovery during session. Provided education throughout.  Patient in [place] w/c with Telesitter and his wife in the room when handed off to SLP at end of session.    Therapy Documentation Precautions:  Precautions Precautions: Fall Precaution Comments: cortrak, BUE mittens on eval Required Braces or Orthoses: Other Brace Other Brace: posey belt in chair Restrictions Weight Bearing Restrictions: No  Therapy/Group: Individual Therapy  Laban Orourke L Harlyn Rathmann PT, DPT  08/18/2019, 12:46 PM

## 2019-08-18 NOTE — Progress Notes (Signed)
Patient ID: Gary [name], male   DOB: [birth year], 74 y.o.   MRN: [id number]  This NP visited patient at the bedside as a follow up for palliative medicine needs and emotional support.    [name]  at bedside.    Patient has transition to CIR and showing signs of improvement.    Created space and opportunity for wife to explore her thoughts and feelings regarding current medical situation.  Understandably this is an overwhelming situation.  Emotional support offered  Education offered regarding the ongoing medical challenges and steps to recovery.   Family is open to all offered and available medical interventions to prolong life.  Education offered on the importance of revisiting advanced directives and treatment option decisions along the course and trajectory of this trauma.  Questions and concerns addressed.   Emotional support offered.  No charge  Greater than 50% of the time was spent in counseling and coordination of care   Wadie Lessen NP  Palliative Medicine Team Team Phone # [phone] Pager [phone]

## 2019-08-19 ENCOUNTER — Inpatient Hospital Stay (HOSPITAL_COMMUNITY): Payer: 59 | Admitting: Speech Pathology

## 2019-08-19 ENCOUNTER — Inpatient Hospital Stay (HOSPITAL_COMMUNITY): Payer: 59 | Admitting: Physical Therapy

## 2019-08-19 ENCOUNTER — Inpatient Hospital Stay (HOSPITAL_COMMUNITY): Payer: 59

## 2019-08-19 DIAGNOSIS — I10 Essential (primary) hypertension: Secondary | ICD-10-CM

## 2019-08-19 DIAGNOSIS — S069X0D Unspecified intracranial injury without loss of consciousness, subsequent encounter: Secondary | ICD-10-CM

## 2019-08-19 LAB — BASIC METABOLIC PANEL
Anion gap: 12 (ref 5–15)
BUN: 48 mg/dL — ABNORMAL HIGH (ref 8–23)
CO2: 22 mmol/L (ref 22–32)
Calcium: 9.8 mg/dL (ref 8.9–10.3)
Chloride: 106 mmol/L (ref 98–111)
Creatinine, Ser: 1.54 mg/dL — ABNORMAL HIGH (ref 0.61–1.24)
GFR calc Af Amer: 52 mL/min — ABNORMAL LOW (ref >60)
GFR calc non Af Amer: 45 mL/min — ABNORMAL LOW (ref >60)
Glucose, Bld: 100 mg/dL — ABNORMAL HIGH (ref 70–99)
Potassium: 4.2 mmol/L (ref 3.5–5.1)
Sodium: 140 mmol/L (ref 135–145)

## 2019-08-19 LAB — PREALBUMIN: Prealbumin: 29.9 mg/dL (ref 18–38)

## 2019-08-19 LAB — GLUCOSE, CAPILLARY: Glucose-Capillary: 97 mg/dL (ref 70–99)

## 2019-08-19 MED ORDER — SODIUM CHLORIDE 0.45 % IV SOLN: INTRAVENOUS | Status: DC

## 2019-08-19 MED ORDER — SENNOSIDES-DOCUSATE SODIUM 8.6-50 MG PO TABS
2.0000 | ORAL_TABLET | Freq: Every day | ORAL | Status: DC
  Administered 2019-08-21 – 2019-08-27 (×6): 2 via ORAL
  Filled 2019-08-19 (×9): qty 2

## 2019-08-19 NOTE — Progress Notes (Signed)
Occupational Therapy Weekly Progress Note  Patient Details  Name: Gary [name] MRN: [id number] Date of Birth: [birth year]  Beginning of progress report period: July 12, 2019 End of progress report period: July 19, 2019  Today's Date: 08/19/2019 OT Individual Time: 1330-1445 OT Individual Time Calculation (min): 75 min    Patient has met 4 of 4 short term goals.  Pt has made excellent progress this reporting period improving from +2 A to min-mod A level for mobilty, balance and ADLs. Pt has improved in all areas of functional cognition and is able to process through ADLs/complete with mod-max cuing for sequencing and safety awareness. Pt continues to be impaired in initiation, processing speed, memory and awareness into deficits impacting efficiency and effort required to perform ADLs. Pt would continue to benefit from skilled OT to improver performance of ADLs toward supervision level and decrease caregiver burden  Patient continues to demonstrate the following deficits: muscle weakness, decreased cardiorespiratoy endurance, impaired timing and sequencing, unbalanced muscle activation, motor apraxia, decreased coordination and decreased motor planning, decreased visual acuity and decreased visual perceptual skills, decreased midline orientation and decreased motor planning, decreased initiation, decreased attention, decreased awareness, decreased problem solving, decreased safety awareness, decreased memory and delayed processing and decreased sitting balance, decreased standing balance, decreased postural control and decreased balance strategies and therefore will continue to benefit from skilled OT intervention to enhance overall performance with BADL and iADL.  Patient progressing toward long term goals..  Continue plan of care.  OT Short Term Goals Week 1:  OT Short Term Goal 1 (Week 1): Pt will initate threading 1LE into pants OT Short Term Goal 1 - Progress (Week 1): Met OT Short Term  Goal 2 (Week 1): Pt will stand pivot transfer to toilet wiht MOD A of 1 to decrease caregiver burden OT Short Term Goal 2 - Progress (Week 1): Met OT Short Term Goal 3 (Week 1): Pt will bathe UB with mod VC for sequencing OT Short Term Goal 3 - Progress (Week 1): Met OT Short Term Goal 4 (Week 1): Pt will complete UB dressing with MOD A OT Short Term Goal 4 - Progress (Week 1): Met Week 2:  OT Short Term Goal 1 (Week 2): Pt will don pants wiht CGA for standing balance only OT Short Term Goal 2 (Week 2): Pt will thread BLE seated wiht no VC for positioning to demo improved safety awareness OT Short Term Goal 3 (Week 2): Pt will bathe with no more than min VC for sequencing to demo improved cognition OT Short Term Goal 4 (Week 2): Pt will groom in standing at sink to demo improved endurance with CGA OT Short Term Goal 5 (Week 2): Pt will transfer to toilet wiht CGA  Skilled Therapeutic Interventions/Progress Updates:    1;1. Pt received in bed agreeable to OT. Pt with no report of pain but reports souble vision even with occluded glasses with OT head "going diagonal." pt agreeable to self feeding with increased time to scan tray to locate utensils and VC for initiation of self feeding. TV turned off to eliminate distractions. Pt often tapping utenils and overall internally distracted during activity. Pt declines bathing/dressing. Pt completes ambulation in hallway to dayroom with MIN A overall and HHA. Pt completes seated and standing bean bag toss with min A for standing balance and pt unable to keep score of cornhole game d/t double vision/cognitive deficits. Pt able to walk to cornhole board and obtain bean bags from ground. Pt able to  complete seated pipe tree activity with needed pieces selected with pt requiring MOD question cues for orientation of pieces/pacing. Exited session with pt seate din bed, exit alarm on and call light in reach.  Therapy Documentation Precautions:   Precautions Precautions: Fall Precaution Comments: cortrak, BUE mittens on eval Required Braces or Orthoses: Other Brace Other Brace: posey belt in chair Restrictions Weight Bearing Restrictions: No General:   Vital Signs: Therapy Vitals Temp: 98.2 F (36.8 C) Pulse Rate: 84 Resp: 18 BP: 105/64 Oxygen Therapy SpO2: 98 % Pain:   ADL: ADL Upper Body Bathing: Maximal assistance Where Assessed-Upper Body Bathing: Sitting at sink Lower Body Bathing: Dependent Where Assessed-Lower Body Bathing: Standing at sink, Sitting at sink Upper Body Dressing: Dependent Where Assessed-Upper Body Dressing: Sitting at sink Lower Body Dressing: Dependent (+2 sit to stand) Where Assessed-Lower Body Dressing: Sitting at sink, Standing at sink Toileting: Dependent (+2) Where Assessed-Toileting: Glass blower/designer: Dependent (+2) Toilet Transfer Method: Arts development officer: Grab bars ADL Comments: +2 A for mobility for safety- able to sit to stand wiht MOD A, however motor planning deficits impacting safety during pivot and stand.sit transitions Vision   Perception    Praxis   Exercises:   Other Treatments:     Therapy/Group: Individual Therapy  [name] 08/19/2019, 6:41 AM
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
## 2019-08-19 NOTE — Progress Notes (Signed)
Physical Therapy Session Note  Patient Details  Name: Gary [name] MRN: [id number] Date of Birth: [birth year]  Today's Date: 08/19/2019 PT Individual Time: 0920-1015 PT Individual Time Calculation (min): 55 min   Short Term Goals:  Week 2:  PT Short Term Goal 1 (Week 2): Patient will perform basic transfers with CGA consistently using LRAD. PT Short Term Goal 2 (Week 2): Patient will ambulate >100 feet with CGA using LRAD. PT Short Term Goal 3 (Week 2): Patient will attend to a task in standing >5 min with CGA.   Skilled Therapeutic Interventions/Progress Updates:   Pt received sitting in WC and agreeable to PT. Pt able to don shoes with set up assist from PT.   Pt transported to rehab gym in [facility]. Gait training with 1 arm over therapist shoulder x 253f with min assist. Dynamic gait training without UE support forward/reverse 3 x 152f Side stepping R and L. 3X 10 ft bil. Weave through 8 cones x 2 with min assist from PT and no UE support. Min cues for decreased speed in turns, but no overt LOB noted.   Stair management training 2 x 4 with BUE support and min assist from PT. Cues for LE position initially then noted to self select step over gait pattern without LOB.   Dynamic balance training and sustained attention to perform cross body and  lateral reach to obtian horseshoe then place on basket ball rim. CGA to maintain balance. Pt able to intiate and follow through with multistep instructions with only occasional cues for attention to task and technique.   Throughout session, pt performed all sit<>stand transfers with supervision assist and BUE pushing from arm rest on WC.   [facility] and all needs met.          Therapy Documentation Precautions:  Precautions Precautions: Fall Precaution Comments: cortrak, BUE mittens on eval Required Braces or Orthoses: Other Brace Other Brace: posey belt in  chair Restrictions Weight Bearing Restrictions: No  Pain: Pain Assessment Pain Scale: 0-10 Pain Score: 0-No pain     Therapy/Group: Individual Therapy  AuLorie Phenix/13/2021, 10:23 AM

## 2019-08-19 NOTE — Progress Notes (Signed)
Speech Language Pathology Weekly Progress and Session Note  Patient Details  Name: Damier Disano MRN: [id number] Date of Birth: 12/05/1945  Beginning of progress report period: August 12, 2019 End of progress report period: August 19, 2019  Today's Date: 08/19/2019 SLP Individual Time: 1100-1200 SLP Individual Time Calculation (min): 60 min  Short Term Goals: Week 1: SLP Short Term Goal 1 (Week 1): Patient will consume current diet with minimal overt s/s of aspiration and Mod A verbal cues for use of swallowing compensatory strategies. SLP Short Term Goal 1 - Progress (Week 1): Met SLP Short Term Goal 2 (Week 1): Patient will consume trials of nectar-thick liquids via cup without overt s/s of aspiration in 90% of trials over 2 sessions prior to upgrade. SLP Short Term Goal 2 - Progress (Week 1): Met SLP Short Term Goal 3 (Week 1): Patient will initiate verbal responses in 50% of opportunities with Max A multimodal cues. SLP Short Term Goal 3 - Progress (Week 1): Met SLP Short Term Goal 4 (Week 1): Patient will follow 1-step commands in 50% of opportunities with Max A multimodal cues. SLP Short Term Goal 4 - Progress (Week 1): Met SLP Short Term Goal 5 (Week 1): Patient will demonstrate sustained attention to functional tasks for 2 minutes with Max A multimodal cues. SLP Short Term Goal 5 - Progress (Week 1): Met SLP Short Term Goal 6 (Week 1): Patient will utilize a sign for orientation to place with Max A multimodal cues. SLP Short Term Goal 6 - Progress (Week 1): Met    New Short Term Goals: Week 2: SLP Short Term Goal 1 (Week 2): Patient will consume current diet with minimal overt s/s of aspiration and Min A verbal cues for use of swallowing compensatory strategies. SLP Short Term Goal 2 (Week 2): Patient will consume trials of thin liquids without overt s/s of aspiration in 75% of trials over 2 sessions to assess readiness for repeat MBS. SLP Short Term Goal 3 (Week 2): Patient  will utilize an increased vocal intensity at the word level to achieve ~75% intelligibility with Mod A verbal cues. SLP Short Term Goal 4 (Week 2): Patient will sustain attention to functional tasks for 15 minutes with Mod verbal cues for redirection. SLP Short Term Goal 5 (Week 2): Patient will demonstrate functional problem solving for basic and familiar tasks with Mod A verbal cues. SLP Short Term Goal 6 (Week 2): Patient will utilize external memory aids to recall daily information with Max A multimodal cues.  Weekly Progress Updates: Patient has made excellent gains and has met 6 of 6 STGs this reporting period. Currently, patient demonstrates behaviors consistent with a Rancho Level VI and requires extra time and overall Mod-Max A multimodal cues to complete functional and familiar tasks safely in regards to orientation, attention, problem solving, recall and awareness. Patient is also consuming Dys. 1 textures with nectar-thick liquids with Mod A verbal cues needed for use of swallowing compensatory strategies. Patient is participating in trials of thin liquids but demonstrates consistent overt s/s of aspiration with cup sips of thin liquids, suspect due to large sips with piecemeal swallows. Patient also demonstrates improved verbal expression with appropriate responses, however, intelligibility is reduced due to aphonia. Question cognitive impairment vs true vocal cord dysfunction. Patient and family education ongoing. Patient would benefit from continued SLP intervention to maximize his swallowing and cognitive functioning prior to discharge.    Intensity: Minumum of 1-2 x/day, 30 to 90 minutes Frequency: 3 to 5  out of 7 days Duration/Length of Stay: 09/02/19 Treatment/Interventions: Cognitive remediation/compensation;Dysphagia/aspiration precaution training;Internal/external aids;Speech/Language facilitation;Therapeutic Activities;Environmental controls;Cueing hierarchy;Functional  tasks;Patient/family education   Daily Session  Skilled Therapeutic Interventions: Skilled treatment session focused on cognitive and dysphagia goals. Upon arrival, patient was awake in the wheelchair but appeared lethargic. SLP facilitated session by providing extra time and Min A verbal and tactile cues for initiation with oral care. Patient consumed trials of thin liquids via tsp with overt coughing in 25% of trials. Coughing was reduced with small, controlled sips via tsp. Therefore, patient may benefit from a provale cup, will attempt trials at next session. SLP also provided skilled observation with trial tray of Dys. 2 textures. Patient demonstrated mildly prolonged mastication and suspected premature spillage resulting in consistent overt s/s of aspiration. Mod-Max A verbal and tactile cues were also needed for use of small bites/sips. Therefore, recommend patient downgrade back to Dys. 1 textures. [name] in agreement and education provided regarding appropriate foods she can bring from home to maximize PO intake.  Patient left upright in the wheelchair with wife present and all needs within reach. Continue with current plan of care.      Pain No/Denies Pain   Therapy/Group: Individual Therapy  Larance Ratledge 08/19/2019, 6:23 AM
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
## 2019-08-20 NOTE — Plan of Care (Signed)
  Problem: Consults Goal: RH BRAIN INJURY PATIENT EDUCATION Description: Description: See Patient Education module for eduction specifics Outcome: Progressing Goal: Skin Care Protocol Initiated - if Braden Score 18 or less Description: If consults are not indicated, leave blank or document N/A Outcome: Progressing Goal: Nutrition Consult-if indicated Outcome: Progressing   Problem: RH BOWEL ELIMINATION Goal: RH STG MANAGE BOWEL WITH ASSISTANCE Description: STG Manage Bowel with min Assistance. Outcome: Progressing Goal: RH STG MANAGE BOWEL W/MEDICATION W/ASSISTANCE Description: STG Manage Bowel with Medication with mod Assistance. Outcome: Progressing   Problem: RH BLADDER ELIMINATION Goal: RH STG MANAGE BLADDER WITH ASSISTANCE Description: STG Manage Bladder With min Assistance Outcome: Progressing   Problem: RH SKIN INTEGRITY Goal: RH STG MAINTAIN SKIN INTEGRITY WITH ASSISTANCE Description: STG Maintain Skin Integrity With min Assistance. Outcome: Progressing   Problem: RH SAFETY Goal: RH STG ADHERE TO SAFETY PRECAUTIONS W/ASSISTANCE/DEVICE Description: STG Adhere to Safety Precautions With mod Assistance/Device. Outcome: Progressing Goal: RH STG DECREASED RISK OF FALL WITH ASSISTANCE Description: STG Decreased Risk of Fall With min Assistance. Outcome: Progressing   Problem: RH COGNITION-NURSING Goal: RH STG USES MEMORY AIDS/STRATEGIES W/ASSIST TO PROBLEM SOLVE Description: STG Uses Memory Aids/Strategies With min Assistance to Problem Solve. Outcome: Progressing Goal: RH STG ANTICIPATES NEEDS/CALLS FOR ASSIST W/ASSIST/CUES Description: STG Anticipates Needs/Calls for Assist With min Assistance/Cues. Outcome: Progressing   Problem: RH PAIN MANAGEMENT Goal: RH STG PAIN MANAGED AT OR BELOW [name] PAIN GOAL Description: Pt pain will be managed at stated goal of 2 Outcome: Progressing   Problem: RH KNOWLEDGE DEFICIT BRAIN INJURY Goal: RH STG INCREASE KNOWLEDGE OF SELF  CARE AFTER BRAIN INJURY Outcome: Progressing Goal: RH STG INCREASE KNOWLEDGE OF DYSPHAGIA/FLUID INTAKE Outcome: Progressing

## 2019-08-20 NOTE — Progress Notes (Signed)
Speech Language Pathology Daily Session Note  Patient Details  Name: Moustafa Mossa MRN: [id number] Date of Birth: 04/01/1945  Today's Date: 08/20/2019 SLP Individual Time: 1345-1355 SLP Individual Time Calculation (min): 10 min  Short Term Goals: Week 2: SLP Short Term Goal 1 (Week 2): Patient will consume current diet with minimal overt s/s of aspiration and Min A verbal cues for use of swallowing compensatory strategies. SLP Short Term Goal 2 (Week 2): Patient will consume trials of thin liquids without overt s/s of aspiration in 75% of trials over 2 sessions to assess readiness for repeat MBS. SLP Short Term Goal 3 (Week 2): Patient will utilize an increased vocal intensity at the word level to achieve ~75% intelligibility with Mod A verbal cues. SLP Short Term Goal 4 (Week 2): Patient will sustain attention to functional tasks for 15 minutes with Mod verbal cues for redirection. SLP Short Term Goal 5 (Week 2): Patient will demonstrate functional problem solving for basic and familiar tasks with Mod A verbal cues. SLP Short Term Goal 6 (Week 2): Patient will utilize external memory aids to recall daily information with Max A multimodal cues.  Skilled Therapeutic Interventions: Skilled treatment session focused on cognitive goals. SLP attempted to see patient for make-up time, however, patient extremely lethargic and required more than a reasonable amount of time for arousal. SLP attempted environmental as well as tactile cues to facilitate initiation sitting EOB without success.  Patient reported he would get up "eventualy" and proceeded to jokingly take his pillow and put if over the SLPs face and requested, "go away." Patient left supine in bed with alarm on and all needs within reach. Continue with current plan of care.      Pain No/Denies Pain   Therapy/Group: Individual Therapy  Jacci Ruberg, [place] 08/20/2019, 1:58 PM

## 2019-08-21 ENCOUNTER — Inpatient Hospital Stay (HOSPITAL_COMMUNITY): Payer: 59

## 2019-08-21 ENCOUNTER — Inpatient Hospital Stay (HOSPITAL_COMMUNITY): Payer: 59 | Admitting: Occupational Therapy

## 2019-08-21 MED ORDER — RESOURCE THICKENUP CLEAR PO POWD: ORAL | Status: DC | PRN

## 2019-08-21 MED ORDER — MECLIZINE HCL 25 MG PO TABS
12.5000 mg | ORAL_TABLET | Freq: Three times a day (TID) | ORAL | Status: DC
  Administered 2019-08-21 – 2019-08-29 (×24): 12.5 mg via ORAL
  Filled 2019-08-21 (×24): qty 1

## 2019-08-21 NOTE — Progress Notes (Signed)
Occupational Therapy Session Note  Patient Details  Name: Gary [name] MRN: [id number] Date of Birth: [birth year]  Today's Date: 08/21/2019 OT Individual Time: 7858-8502 and 7741-2878 OT Individual Time Calculation (min): 41 min and 27 min   Short Term Goals: Week 2:  OT Short Term Goal 1 (Week 2): Pt will don pants wiht CGA for standing balance only OT Short Term Goal 2 (Week 2): Pt will thread BLE seated wiht no VC for positioning to demo improved safety awareness OT Short Term Goal 3 (Week 2): Pt will bathe with no more than min VC for sequencing to demo improved cognition OT Short Term Goal 4 (Week 2): Pt will groom in standing at sink to demo improved endurance with CGA OT Short Term Goal 5 (Week 2): Pt will transfer to toilet wiht CGA  Skilled Therapeutic Interventions/Progress Updates:    Session 1: Pt greeted at time of session reclined in bed agreeable to OT session, needing to use bathroom. No c/o pain throughout. Wife Candy present throughout. Supine to sit EOB supervision with cues for initiation. Sit to stand CGA, ambulated to bathroom in same manner with RW with extended time. SPT to toilet CGA/Min and doffed brief with Min A in standing. Did not perform hygiene as he stated he did not go, donned new brief as underwear, able to forward weight shift and thread feet into "underwear" Min A and static stand without UE support to don over hips. Ambulated back to room, to [place] chair with CGA/Min with RW, cues for fully turning and maneuvering RW. Set up at sink level for UB bathing, axilla area and face washing with supervision for thoroughness. UB dress Supervision for problem solving, LB dress Min with pt able to bring feet up to himself in sitting to thread, static stand with CGA while donning over hips and tying bow with laces. Donned socks and shoes with supervision in sitting, able to again bring feet up to himself in sitting to don socks and shoes as well. Extended time required  for all tasks d/t slow initiation and processing. Encouragement provided to focus on his progress and improvements. Up in chair with alarm on, call bell in reach.   Session 2: Pt greeted at time of session sitting up in wheelchair agreeable to OT session, no c/o pain. SPT TIS to standard wheelchair CGA/Min with RW, brought to gym via wheelchair for time management and performed dynamic standing activity with bean bag toss with unilateral support on AD tossing with R hand to improve balance and hand eye coordination. Ambulated back to room with RW with CGA and SPT back to TIS chair in the same manner. Call bell in reach, alarm on.     Therapy Documentation Precautions:  Precautions Precautions: Fall Precaution Comments: cortrak, BUE mittens on eval Required Braces or Orthoses: Other Brace Other Brace: posey belt in chair Restrictions Weight Bearing Restrictions: No    Therapy/Group: Individual Therapy  Viona Gilmore 08/21/2019, 10:47 AM
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
## 2019-08-21 NOTE — Plan of Care (Signed)
  Problem: Consults Goal: RH BRAIN INJURY PATIENT EDUCATION Description: Description: See Patient Education module for eduction specifics Outcome: Progressing Goal: Skin Care Protocol Initiated - if Braden Score 18 or less Description: If consults are not indicated, leave blank or document N/A Outcome: Progressing Goal: Nutrition Consult-if indicated Outcome: Progressing   Problem: RH BOWEL ELIMINATION Goal: RH STG MANAGE BOWEL WITH ASSISTANCE Description: STG Manage Bowel with min Assistance. Outcome: Progressing Goal: RH STG MANAGE BOWEL W/MEDICATION W/ASSISTANCE Description: STG Manage Bowel with Medication with mod Assistance. Outcome: Progressing   Problem: RH BLADDER ELIMINATION Goal: RH STG MANAGE BLADDER WITH ASSISTANCE Description: STG Manage Bladder With min Assistance Outcome: Progressing   Problem: RH SKIN INTEGRITY Goal: RH STG MAINTAIN SKIN INTEGRITY WITH ASSISTANCE Description: STG Maintain Skin Integrity With min Assistance. Outcome: Progressing   Problem: RH SAFETY Goal: RH STG ADHERE TO SAFETY PRECAUTIONS W/ASSISTANCE/DEVICE Description: STG Adhere to Safety Precautions With mod Assistance/Device. Outcome: Progressing Goal: RH STG DECREASED RISK OF FALL WITH ASSISTANCE Description: STG Decreased Risk of Fall With min Assistance. Outcome: Progressing   Problem: RH COGNITION-NURSING Goal: RH STG USES MEMORY AIDS/STRATEGIES W/ASSIST TO PROBLEM SOLVE Description: STG Uses Memory Aids/Strategies With min Assistance to Problem Solve. Outcome: Progressing Goal: RH STG ANTICIPATES NEEDS/CALLS FOR ASSIST W/ASSIST/CUES Description: STG Anticipates Needs/Calls for Assist With min Assistance/Cues. Outcome: Progressing   Problem: RH PAIN MANAGEMENT Goal: RH STG PAIN MANAGED AT OR BELOW [name] PAIN GOAL Description: Pt pain will be managed at stated goal of 2 Outcome: Progressing   Problem: RH KNOWLEDGE DEFICIT BRAIN INJURY Goal: RH STG INCREASE KNOWLEDGE OF SELF  CARE AFTER BRAIN INJURY Outcome: Progressing Goal: RH STG INCREASE KNOWLEDGE OF DYSPHAGIA/FLUID INTAKE Outcome: Progressing

## 2019-08-22 ENCOUNTER — Inpatient Hospital Stay (HOSPITAL_COMMUNITY): Payer: 59

## 2019-08-22 ENCOUNTER — Inpatient Hospital Stay (HOSPITAL_COMMUNITY): Payer: 59 | Admitting: Speech Pathology

## 2019-08-22 LAB — TSH: TSH: 37.411 u[IU]/mL — ABNORMAL HIGH (ref 0.350–4.500)

## 2019-08-22 LAB — BASIC METABOLIC PANEL
Anion gap: 10 (ref 5–15)
BUN: 31 mg/dL — ABNORMAL HIGH (ref 8–23)
CO2: 23 mmol/L (ref 22–32)
Calcium: 9.3 mg/dL (ref 8.9–10.3)
Chloride: 108 mmol/L (ref 98–111)
Creatinine, Ser: 1.59 mg/dL — ABNORMAL HIGH (ref 0.61–1.24)
GFR calc Af Amer: 50 mL/min — ABNORMAL LOW (ref >60)
GFR calc non Af Amer: 43 mL/min — ABNORMAL LOW (ref >60)
Glucose, Bld: 100 mg/dL — ABNORMAL HIGH (ref 70–99)
Potassium: 3.7 mmol/L (ref 3.5–5.1)
Sodium: 141 mmol/L (ref 135–145)

## 2019-08-22 LAB — T4, FREE: Free T4: 0.6 ng/dL — ABNORMAL LOW (ref 0.61–1.12)

## 2019-08-22 LAB — VITAMIN D 25 HYDROXY (VIT D DEFICIENCY, FRACTURES): Vit D, 25-Hydroxy: 42.61 ng/mL (ref 30–100)

## 2019-08-22 NOTE — Progress Notes (Signed)
Speech Language Pathology Daily Session Note  Patient Details  Name: Gary [name] MRN: [id number] Date of Birth: 1945/06/14  Today's Date: 08/22/2019 SLP Individual Time: 6438-3779 SLP Individual Time Calculation (min): 55 min  Short Term Goals: Week 2: SLP Short Term Goal 1 (Week 2): Patient will consume current diet with minimal overt s/s of aspiration and Min A verbal cues for use of swallowing compensatory strategies. SLP Short Term Goal 2 (Week 2): Patient will consume trials of thin liquids without overt s/s of aspiration in 75% of trials over 2 sessions to assess readiness for repeat MBS. SLP Short Term Goal 3 (Week 2): Patient will utilize an increased vocal intensity at the word level to achieve ~75% intelligibility with Mod A verbal cues. SLP Short Term Goal 4 (Week 2): Patient will sustain attention to functional tasks for 15 minutes with Mod verbal cues for redirection. SLP Short Term Goal 5 (Week 2): Patient will demonstrate functional problem solving for basic and familiar tasks with Mod A verbal cues. SLP Short Term Goal 6 (Week 2): Patient will utilize external memory aids to recall daily information with Max A multimodal cues.  Skilled Therapeutic Interventions: Skilled treatment session focused on cognitive and dysphagia goals. SLP facilitated session by providing overall Min A verbal and visual cues for problem solving with a basic money management task. Suspect patient's function with task was impacted by decreased visual acuity. Patient engaged in an informal conversation about his family for ~5 minutes. All interactions/verbal responses were timely, appropriate and 100% intelligible due to an increased vocal intensity. SLP also facilitated session by providing trials of thin liquids via cup. Patient consumed trials without overt s/s of aspiration, therefore, recommend repeat MBS tomorrow to assess function. Patient left upright in wheelchair with alarm on and all needs  within reach. Continue with current plan of care.      Pain No/Denies Pain   Therapy/Group: Individual Therapy  Latoya Diskin, [place] 08/22/2019, 12:14 PM

## 2019-08-22 NOTE — Progress Notes (Signed)
Occupational Therapy Session Note  Patient Details  Name: Gary [name] MRN: [id number] Date of Birth: 26-Oct-1945  Today's Date: 08/22/2019 OT Individual Time: 1300-1414 OT Individual Time Calculation (min): 74 min    Short Term Goals: Week 2:  OT Short Term Goal 1 (Week 2): Pt will don pants wiht CGA for standing balance only OT Short Term Goal 2 (Week 2): Pt will thread BLE seated wiht no VC for positioning to demo improved safety awareness OT Short Term Goal 3 (Week 2): Pt will bathe with no more than min VC for sequencing to demo improved cognition OT Short Term Goal 4 (Week 2): Pt will groom in standing at sink to demo improved endurance with CGA OT Short Term Goal 5 (Week 2): Pt will transfer to toilet wiht CGA  Skilled Therapeutic Interventions/Progress Updates:    Pt received supine with wife [name] present initially, leaving soon after OT arrival. Pt c/o double and/or triple vision and was willing to trial occlusion glasses again. Pt reporting only seeing one when OT held up functional items. Pt requested to switch occlusion to R side of glasses d/t vision being slightly better in that eye. Pt BP while supine was 113/65. Pt stood from EOB and in standing BP was 99/38. Pt asymptomatic. Pt completed ~20 ft of functional mobility around the room to monitor hemodynamic stability- BP following was 102/64. Pt agreeable to leave room. Pt completed 400 ft of functional mobility with no rest break with overall CGA, heavy UE reliance on RW. Pt much more alert and conversing appropriately with OT. Pt took extended rest break in therapy gym. Pt then reported need to use the bathroom and requested to return to room. Slow, steady pace with use of RW, min cueing for upright posture and looking ahead/scanning environment. Pt transferred onto toilet with CGA and managed all his clothing with close (S). He voided urine and completed peri hygiene as though he voided BM but none was seen in the toilet. Pt  used RW to ambulate to the sink where he brushed his teeth with close (S). Pt returned to supine in bed. Bed alarm set and all needs met.   Therapy Documentation Precautions:  Precautions Precautions: Fall Precaution Comments: cortrak, BUE mittens on eval Required Braces or Orthoses: Other Brace Other Brace: posey belt in chair Restrictions Weight Bearing Restrictions: No   Therapy/Group: Individual Therapy  Curtis Sites 08/22/2019, 6:37 AM
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
## 2019-08-22 NOTE — Progress Notes (Signed)
Physical Therapy Session Note  Patient Details  Name: Gary [name] MRN: [id number] Date of Birth: November 15, 1945  Today's Date: 08/22/2019 PT Individual Time: 0900-1000 PT Individual Time Calculation (min): 60 min   Short Term Goals: Week 2:  PT Short Term Goal 1 (Week 2): Patient will perform basic transfers with CGA consistently using LRAD. PT Short Term Goal 2 (Week 2): Patient will ambulate >100 feet with CGA using LRAD. PT Short Term Goal 3 (Week 2): Patient will attend to a task in standing >5 min with CGA.  Skilled Therapeutic Interventions/Progress Updates:     Patient in [facility] w/c with his wife present upon PT arrival. Patient alert and agreeable to PT session. Patient denied pain during session. Focused session on family education/visitation session outside with his daughter and granddaughter.   Patient was transported in the [place] w/c with total A to/from the [facility] main entrance during session for energy conservation and time management. Patient sat with his daughter and granddaughter who live in the home with him and his wife. Patient and family initially were emotional about seeing each other and progressed to elevated mood and conversation. Patient remained appropriate with all verbal and non-verbal interactions throughout. Asking questions about his granddaughter returning to school and visit to the water park and his daughter about work and her new car, and joking and laughing appropriately throughout. Patient with improved vocalization both amount and volume during interaction. Provided education on patient's progress and goals while in rehab to patient's family. Also, discussed changes at home to patient's schedule (consistent from day to day) and behaviors (fatigue, low frustration tolerance, decreased STM) that will be different following d/c home. Patient's daughter and granddaughter attentive to education and denied any questions at this time.   Discussed home set up and  d/c planning with patient's wife during the visit. Reports that patient sleeps upstairs in a separate bedroom and that family sleeps upstairs, but will be downstairs most of the day. Educated on having a quiet space for the patient to rest downstairs during the day. Reports that she can close off the living room for the patient to rest, but states that there is not a place downstairs that the patient can lie down. Also discussed concerns about night time sleeping, as they sleep in separate rooms and there is not a way to arrange for either of them to sleep in the same room. Patient also with increased urinary urgency at night placing patient at increased fall risk for frequent bathroom visits. Educated on use of urinal at night to reduce trips to the bathroom. [name] reports that she is the only person that will be available 24/7 to assist the patient at home. Her daughters work during the day and will only be able to assist intermittently, but infrequently. Discussed caregiver self-care and increased caregiver burden following TBI. Will speak to CSW about services for caregiver respite during the day.  Patient in [place] with his wife in the room at end of session with breaks locked, seat belt alarm set, and all needs within reach. Patient very appreciative of the opportunity to see his granddaughter and daughter. Reporting feeling increased fatigue after. Educated on fatigue management following TBI.    Therapy Documentation Precautions:  Precautions Precautions: Fall Precaution Comments: cortrak, BUE mittens on eval Required Braces or Orthoses: Other Brace Other Brace: posey belt in chair Restrictions Weight Bearing Restrictions: No   Therapy/Group: Individual Therapy  Adleigh Mcmasters L Neala Miggins PT, DPT  08/22/2019, 12:38 PM

## 2019-08-23 ENCOUNTER — Inpatient Hospital Stay (HOSPITAL_COMMUNITY): Payer: 59

## 2019-08-23 ENCOUNTER — Inpatient Hospital Stay (HOSPITAL_COMMUNITY): Payer: No Typology Code available for payment source

## 2019-08-23 ENCOUNTER — Encounter (HOSPITAL_COMMUNITY): Payer: 59 | Admitting: Speech Pathology

## 2019-08-23 MED ORDER — ENSURE ENLIVE PO LIQD
237.0000 mL | Freq: Two times a day (BID) | ORAL | Status: DC
  Administered 2019-08-23 – 2019-09-02 (×12): 237 mL via ORAL

## 2019-08-23 NOTE — Patient Care Conference (Signed)
Inpatient RehabilitationTeam Conference and Plan of Care Update Date: 08/23/2019   Time: 10:01 AM    Patient Name: Gary [name]      Medical Record Number: [id number]  Date of Birth: February 08, 1945 Sex: Male         Room/Bed: 4W12C/4W12C-02 Payor Info: Payor: Theme park manager / Plan: [employer] OTHER / Product Type: *No Product type* /    Admit Date/Time:  08/11/2019  5:16 PM  Primary Diagnosis:  TBI (traumatic brain injury) [facility])  Hospital Problems: Principal Problem:   TBI (traumatic brain injury) ([facility]) Active Problems:   Malnutrition of moderate degree    Expected Discharge Date: Expected Discharge Date: 09/05/19  Team Members Present: Physician leading conference: Dr. Alger Simons Care Coodinator Present: Loralee Pacas, LCSWA;Jeryn Cerney Creig Hines, RN, BSN, [place] Nurse Present: Mohammed Kindle, RN PT Present: Apolinar Junes, PT OT Present: Laverle Hobby, OT SLP Present: Weston Anna, SLP PPS Coordinator present : Gunnar Fusi, Novella Olive, PT     Current Status/Progress Goal Weekly Team Focus  Bowel/Bladder   Pt is continent of b/b, LBM 08/22/19  Pt will remain continent of b/b  Timed toileting/ PRN   Swallow/Nutrition/ Hydration   Dys. 2 textures with thin liquids, Min-Mod A cues needed for use of swallow strategies  Supervision least restrictive diet  tolerance of current diet, trials of Dys. 3 textures   ADL's   Progressing well, improvements in attention, awareness, and initiation. CGA-minA transfers. Mod-max cueing sequencing through ADLs, but min A overall. Still with slow initiation and processing/sequencing  Supervision to min A  Cognitive retraining, safety awareness, attention, initiation, sequencing, functional activity tolerance, ADL retraining   Mobility   Supervision-CGA bed mobility and transfers, gait >250 ft min A HHA, CGA-min A 12 steps B rails  Supervision-CGA overall  Vestibular eval/treatment, functional mobility, activity tolerance,  gait and stair training, balance, attention, cognition, TBI education, patient/caregiver education   Communication   Supervision  Supervision  Goal Met   Safety/Cognition/ Behavioral Observations  Mod A  Min A  problem solving, recall, attention, safety awareness   Pain   Pt denies pain at this time. PRN Oxy and tylenol if neded  pain scale <2/10  Assess pain qshift/prn   Skin   NO obvious signs of skin breakdown, bruising to bilaterla armsa and abd.  no new skin break down while on CIR  Assess skin qshift/prn     Discharge Planning:  Pt is worker's comp.Worker's comp [employer] to visit pt on Wed 8am. Discharge to home with spouse. Pt has 1 daughter in the home to assist, and 1 daughter nearby available to assist. Pt has limited extended family/social support.   Team Discussion: No complaints of pain. Continent of B/B. Sleep walking at night reported, this is new. Attention and awareness is improving, still has slow initiation. Transfers are min assist with ambulation at hand held assist. Double vision is improving. Swallowing is improving and has been upgraded to a Dys 2 diet. Wife is requesting a hospital bed so the pt can have a place to rest downstairs when the family is downstairs during the daytime hours. Patient on target to meet rehab goals: yes  *See Care Plan and progress notes for long and short-term goals.   Revisions to Treatment Plan:  MD to discontinue IVF. Teaching Needs: Continue family education  Current Barriers to Discharge: Inaccessible home environment, Decreased caregiver support, Home enviroment access/layout and Lack of/limited family support  Possible Resolutions to Barriers: Hospital bed to have downstairs for pt  when family is downstairs so pt will have place to rest during the day. Pt to discharge to home with spouse, one daughter in the home, and one daughter lives nearby, and can assist.     Medical Summary Current Status: improving cognition.  more attentive. phonation increased. vertigo, baseline sx?  Barriers to Discharge: Medical stability   Possible Resolutions to Barriers/Weekly Focus: sleep wake restoration, upgrade diet and remove supplemental IVF   Continued Need for Acute Rehabilitation Level of Care: The patient requires daily medical management by a physician with specialized training in physical medicine and rehabilitation for the following reasons: Direction of a multidisciplinary physical rehabilitation program to maximize functional independence : Yes Medical management of patient stability for increased activity during participation in an intensive rehabilitation regime.: Yes Analysis of laboratory values and/or radiology reports with any subsequent need for medication adjustment and/or medical intervention. : Yes   I attest that I was present, lead the team conference, and concur with the assessment and plan of the team.   Dorthula Nettles G 08/23/2019, 1:40 PM

## 2019-08-23 NOTE — Progress Notes (Signed)
Modified Barium Swallow Progress Note  Patient Details  Name: Gary [name] MRN: [id number] Date of Birth: 08/19/1945  Today's Date: 08/23/2019  Modified Barium Swallow completed.  Full report located under Chart Review in the Imaging Section.  Brief recommendations include the following:  Clinical Impression  Patient demonstrates a mild oropharyngeal dysphagia. Oral phase is characterized by piecemeal swallowing with all liquids and mildly prolonged mastication with a delayed swallow initiation to the vallecula with solid textures.  Despite large sips and piecemeal swallows, patient was able to fully protect is airway. Mild lingual residue noted that patient cleared.  Due to patient's inconsistent level of arousal at times, recommend patient upgrade to Dys. 2 textures with thin liquids with full supervision to maximize safety with PO intake.   Swallow Evaluation Recommendations       SLP Diet Recommendations: Dysphagia 2 (Fine chop) solids;Thin liquid   Liquid Administration via: Cup;Straw   Medication Administration: Crushed with puree   Supervision: Patient able to self feed;Full supervision/cueing for compensatory strategies   Compensations: Slow rate;Small sips/bites;Minimize environmental distractions   Postural Changes: Seated upright at 90 degrees   Oral Care Recommendations: Oral care BID        Juleen Sorrels 08/23/2019,9:56 AM
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
## 2019-08-23 NOTE — Progress Notes (Signed)
Occupational Therapy Session Note  Patient Details  Name: Gary [name] MRN: [id number] Date of Birth: [birth year]  Today's Date: 08/23/2019 OT Individual Time: 1300-1414 OT Individual Time Calculation (min): 74 min    Short Term Goals: Week 2:  OT Short Term Goal 1 (Week 2): Pt will don pants wiht CGA for standing balance only OT Short Term Goal 2 (Week 2): Pt will thread BLE seated wiht no VC for positioning to demo improved safety awareness OT Short Term Goal 3 (Week 2): Pt will bathe with no more than min VC for sequencing to demo improved cognition OT Short Term Goal 4 (Week 2): Pt will groom in standing at sink to demo improved endurance with CGA OT Short Term Goal 5 (Week 2): Pt will transfer to toilet wiht CGA  Skilled Therapeutic Interventions/Progress Updates:    Pt received sitting in TIS w/c, eating lunch with NT supervision. Pt with much better PO intake than usual. Pt also much clearer cognitively today. Discussed conference and d/c. Pt requesting to use phone to call his daughter. Assisted pt in calling his daughter. Pt had appropriate conversation with his daughter. Called pt's wife to ensure proper equipment was ordered- [facility] and shower chair needed to ensure safety and independence at home. Pt completed 200 ft of functional mobility with RW, (S) overall to the therapy gym. Remainder of session focused on dynamic standing balance and visual scanning/perception. Pt completed functional stepping activity in standing without UE support, activity graded to challenge dynamic standing balance and with a smaller target to challenge visual perception. Occluded glasses were very successful at reducing diplopia this session and greatly improved dynamic standing balance. Pt required min A overall for single leg stance phase of activity. Pt completed functional mobility through cones, weaving around, with moderate difficulty, hitting about 2/5 cones each trial. Pt returned to his room and was  left sitting up with all needs met, chair alarm set and daughter present.   Therapy Documentation Precautions:  Precautions Precautions: Fall Precaution Comments: cortrak, BUE mittens on eval Required Braces or Orthoses: Other Brace Other Brace: posey belt in chair Restrictions Weight Bearing Restrictions: No  Therapy/Group: Individual Therapy  Curtis Sites 08/23/2019, 6:49 AM

## 2019-08-23 NOTE — Progress Notes (Addendum)
Patient ID: Gary [name], male   DOB: December 18, 1945, 74 y.o.   MRN: [id number]  SW followed up with RN/CM Meredeth Ide (p:[phone]/[phone])to provide updates from team conference, however, she was with a pt and will call back.  SW spoke with pt wife Gary [name] [phone]) to provide updates on change in d/c date from 8/27 to 8/30. SW also provided updates from team conference, gains made, and d/c recs: DME- 3in1 BSC and shower chair and OPT PT/OT/SLP. Wife reports that she thinks pt will require a hospital bed if she is working downstairs and he needs a place to rest. She also asked who will assist with aide care at night when he needs to get up to go the bathroom several times. SW informed will speak with medical team on if a hospital bed is needed, and would speak with worker's comp CM about evening aide assistance.SW reminded wife that worker's comp makes the final decision on any requests. When discussing family edu, she reports she is always here and knows his level of care needs. SW explained the importance of family education. She states she can come in for family edu on 8/19 9am-11am with her dtr Raquel Sarna.  *SW spoke with [name] to provide updates from team conference.  Loralee Pacas, MSW, [place] Office: [phone] Cell: [phone] Fax: [phone]

## 2019-08-24 ENCOUNTER — Inpatient Hospital Stay (HOSPITAL_COMMUNITY): Payer: 59

## 2019-08-24 ENCOUNTER — Inpatient Hospital Stay (HOSPITAL_COMMUNITY): Payer: 59 | Admitting: Speech Pathology

## 2019-08-24 NOTE — Plan of Care (Signed)
  Problem: RH SAFETY Goal: RH STG ADHERE TO SAFETY PRECAUTIONS W/ASSISTANCE/DEVICE Description: STG Adhere to Safety Precautions With mod Assistance/Device. Outcome: Not Progressing; telesitter
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
## 2019-08-24 NOTE — Progress Notes (Addendum)
Occupational Therapy Session Note  Patient Details  Name: Gary [name] MRN: [id number] Date of Birth: 1945/11/14  Today's Date: 08/24/2019 OT Individual Time: 1300-1415 OT Individual Time Calculation (min): 75 min    Short Term Goals: Week 1:  OT Short Term Goal 1 (Week 1): Pt will initate threading 1LE into pants OT Short Term Goal 1 - Progress (Week 1): Met OT Short Term Goal 2 (Week 1): Pt will stand pivot transfer to toilet wiht MOD A of 1 to decrease caregiver burden OT Short Term Goal 2 - Progress (Week 1): Met OT Short Term Goal 3 (Week 1): Pt will bathe UB with mod VC for sequencing OT Short Term Goal 3 - Progress (Week 1): Met OT Short Term Goal 4 (Week 1): Pt will complete UB dressing with MOD A OT Short Term Goal 4 - Progress (Week 1): Met  Skilled Therapeutic Interventions/Progress Updates:    1:1. Pt received in bed reporting dizziness, but willing to get up with OT. Vitals monitored throughout session and WNL and stable despite chanes in position. Pt ambulates into/out of bathroom with 1person HHA and min A with pt reaching out despite cuing to let arm swing at side. Pt requires VC for seated ADL performance except for peri hygiene and advancing pants past hips. Pt will require 24/7 supervision at home d/t decreased safety awareness deficits requiring cuing during all mobility/ADL tasks for safe positioning.Pt performs bathing and dressing at [place] level sit to stand with improved initiation and sequencing from the last time this clinician bathed with pt. No cuing for pacing required. Pt uses RW for functional mobility with VC for wider BOS when walking. Pt plays mini golf 2x in standing with improved accuracy and contact with the ball when using club with R eye occluded. Pt reports seeing double with B eyes and L eye occluded. Pt returned to bed, exit alarm on and call light in reach  Therapy Documentation Precautions:  Precautions Precautions: Fall Precaution Comments:  cortrak, BUE mittens on eval Required Braces or Orthoses: Other Brace Other Brace: posey belt in chair Restrictions Weight Bearing Restrictions: No General:   Vital Signs:   Pain:   ADL: ADL Upper Body Bathing: Maximal assistance Where Assessed-Upper Body Bathing: Sitting at sink Lower Body Bathing: Dependent Where Assessed-Lower Body Bathing: Standing at sink, Sitting at sink Upper Body Dressing: Dependent Where Assessed-Upper Body Dressing: Sitting at sink Lower Body Dressing: Dependent (+2 sit to stand) Where Assessed-Lower Body Dressing: Sitting at sink, Standing at sink Toileting: Dependent (+2) Where Assessed-Toileting: Glass blower/designer: Dependent (+2) Toilet Transfer Method: Arts development officer: Grab bars ADL Comments: +2 A for mobility for safety- able to sit to stand wiht MOD A, however motor planning deficits impacting safety during pivot and stand.sit transitions Vision   Perception    Praxis   Exercises:   Other Treatments:     Therapy/Group: Individual Therapy  [name] 08/24/2019, 2:13 PM

## 2019-08-24 NOTE — Progress Notes (Addendum)
Patient ID: Gary [name], male   DOB: Feb 27, 1945, 74 y.o.   MRN: [id number]   SWfollowed up with RN/CM Meredeth Ide (p:[phone]/[phone])inform on d/c recommendations: 1)DME- RW, 3in1 BSC, shower chair, w/c, and hospital bed; 2) Outpatient PT/OT/SLP and 24/7 care aide service. [name] reports able to assist with transportation to/from outpatient therapies, and also asked if pt will need transport home. SW to follow-up after fam edu tomorrow. SW faxed updates clinicals.  *Per medical team, [place] is better for pt. SW called [facility] with updates. SW sent updated orders.   Loralee Pacas, MSW, [place] Office: [phone] Cell: [phone] Fax: [phone]

## 2019-08-24 NOTE — Progress Notes (Signed)
Speech Language Pathology Daily Session Note  Patient Details  Name: Gary [name] MRN: [id number] Date of Birth: July 27, 1945  Today's Date: 08/24/2019 SLP Individual Time: 1125-1200 SLP Individual Time Calculation (min): 35 min  Missed Time: 25 minutes, fatigue and dizziness   Short Term Goals: Week 2: SLP Short Term Goal 1 (Week 2): Patient will consume current diet with minimal overt s/s of aspiration and Min A verbal cues for use of swallowing compensatory strategies. SLP Short Term Goal 2 (Week 2): Patient will consume trials of thin liquids without overt s/s of aspiration in 75% of trials over 2 sessions to assess readiness for repeat MBS. SLP Short Term Goal 3 (Week 2): Patient will utilize an increased vocal intensity at the word level to achieve ~75% intelligibility with Mod A verbal cues. SLP Short Term Goal 4 (Week 2): Patient will sustain attention to functional tasks for 15 minutes with Mod verbal cues for redirection. SLP Short Term Goal 5 (Week 2): Patient will demonstrate functional problem solving for basic and familiar tasks with Mod A verbal cues. SLP Short Term Goal 6 (Week 2): Patient will utilize external memory aids to recall daily information with Max A multimodal cues.  Skilled Therapeutic Interventions: Upon arrival, patient reports not feeling well with fatigue, therefore, patient missed initial 25 minutes of session. Skilled treatment session focused on dysphagia and cognitive goals. SLP facilitated session by providing skilled observation with lunch tray of Dys. 2 textures with thin liquids. Patient consumed meal without overt s/s of aspiration and overall Mod I for use of swallowing compensatory strategies. Mod A verbal cues were needed for attention to self-feeding due to being distracted by a functional conversation with the patient's wife. Recommend patient continue current diet. Patient left upright in bed with alarm on and all needs within reach. Continue with  current plan of care.      Pain No/Denies Pain but reports ongoing dizziness   Therapy/Group: Individual Therapy  Kyreese Chio 08/24/2019, 12:36 PM

## 2019-08-24 NOTE — Progress Notes (Signed)
Physical Therapy Session Note  Patient Details  Name: Gary [name] MRN: [id number] Date of Birth: 1945/12/25  Today's Date: 08/24/2019 PT Individual Time: 0915-1015 PT Individual Time Calculation (min): 60 min   Short Term Goals: Week 2:  PT Short Term Goal 1 (Week 2): Patient will perform basic transfers with CGA consistently using LRAD. PT Short Term Goal 2 (Week 2): Patient will ambulate >100 feet with CGA using LRAD. PT Short Term Goal 3 (Week 2): Patient will attend to a task in standing >5 min with CGA.  Skilled Therapeutic Interventions/Progress Updates:     Patient in bed asleep upon PT arrival. Patient easily aroused and agreeable to PT session. Patient denied pain during session, however, reported feeling "very tired" this morning and reported intermittent dizziness with mobilty. He also perseverated on his visual deficits this session. Per patient request, trialed reading glasses, prescription glasses, and modified occlusion glasses provided by OT. Patient reports no improvement in vision with any glasses. Reports horizontal double with intermittent triple vision. States it improves with L eye closed, but then returns after some time, ~30 sec with L eye closed.  Therapeutic Activity: Bed Mobility: Patient performed supine to/from sit with supervision-mod I in a flat bed with min use of bed rails.  Transfers: Patient performed sit to/from stand x4 and toilet transfers x2 with supervision with and without RW. Provided verbal cues for forward weight shift and safe hand placement with use of RW. Patient was continent of bowl and bladder x2 during toileting, performed peri-care and LB clothing management independently. He stood at the sink to wash his hands independently after toileting both times, required supervision for balance.   Gait Training:  Patient ambulated 10-15 feet to/from the bathroom x4 using the RW with close supervision and >150 feet x2 without an AD with min A-CGA,  increased lateral LOB today with decreased foot clearance and propulsion. Provided verbal cues for erect posture, increased step height and length, and increased arm swing for improved balance. Patient reported that visual deficits were making it difficult for him to walk, however, reports deficits are no worse today, just that he is more aware of them. Patient wore the occlusive glassed on second walk without an AD with mild improvement in balance.  [name] arrived during session. Discussed d/c planning, follow-up therapy, DME needs and home set-up with the patient and his wife. Per discussion, there is not a bed or place for the patient to rest during the day downstairs, recommending a hospital bed to allow for supervision during the day while patient's wife works downstairs, recommending a 16"x16" lightweight w/c for safe community mobility due to deficits in balance and activity tolerance, and recommending a RW for improved balance, safety, and increased independence with ambulation. After reviewing home set-up, determined that home health therapies would benefit patient initially to acclimate patient to home setting for safety due to 2 level home, various floor surfaces, and for family education for daily acctivities and gradual community re-integration  Patient required increased time and rest breaks with mobility today due to fatigue and perseveration on visual deficits.   Patient in bed with his wife at bedside at end of session with breaks locked, bed alarm set, and all needs within reach.    Therapy Documentation Precautions:  Precautions Precautions: Fall Precaution Comments: cortrak, BUE mittens on eval Required Braces or Orthoses: Other Brace Other Brace: posey belt in chair Restrictions Weight Bearing Restrictions: No   Therapy/Group: Individual Therapy  Shayon Trompeter L Joselin Crandell PT,  DPT  08/24/2019, 12:12 PM

## 2019-08-25 ENCOUNTER — Encounter (HOSPITAL_COMMUNITY): Payer: Medicare Other | Admitting: Speech Pathology

## 2019-08-25 ENCOUNTER — Encounter (HOSPITAL_COMMUNITY): Payer: Medicare Other

## 2019-08-25 ENCOUNTER — Inpatient Hospital Stay (HOSPITAL_COMMUNITY): Payer: Medicare Other

## 2019-08-25 ENCOUNTER — Ambulatory Visit (HOSPITAL_COMMUNITY): Payer: Medicare Other

## 2019-08-25 NOTE — Progress Notes (Signed)
Speech Language Pathology Daily Session Note  Patient Details  Name: Quincy Boy MRN: [id number] Date of Birth: 11/29/1945  Today's Date: 08/25/2019 SLP Individual Time: 1000-1055 SLP Individual Time Calculation (min): 55 min  Short Term Goals: Week 2: SLP Short Term Goal 1 (Week 2): Patient will consume current diet with minimal overt s/s of aspiration and Min A verbal cues for use of swallowing compensatory strategies. SLP Short Term Goal 2 (Week 2): Patient will consume trials of thin liquids without overt s/s of aspiration in 75% of trials over 2 sessions to assess readiness for repeat MBS. SLP Short Term Goal 3 (Week 2): Patient will utilize an increased vocal intensity at the word level to achieve ~75% intelligibility with Mod A verbal cues. SLP Short Term Goal 4 (Week 2): Patient will sustain attention to functional tasks for 15 minutes with Mod verbal cues for redirection. SLP Short Term Goal 5 (Week 2): Patient will demonstrate functional problem solving for basic and familiar tasks with Mod A verbal cues. SLP Short Term Goal 6 (Week 2): Patient will utilize external memory aids to recall daily information with Max A multimodal cues.  Skilled Therapeutic Interventions: Skilled treatment session focused on dysphagia goals and family education with the patient's wife and daughter.  SLP facilitated session by providing trials of Dys. 3 textures. Patient demonstrated efficient mastication and complete oral clearance without overt s/s of aspiration, therefore, recommend trial tray prior to upgrade. SLP also facilitated session by providing education to the patient's family in regards to the patient's current cognitive functioning and strategies to utilize at home to maximize attention, recall and overall safety. Both verbalized understanding and all questions were answered at this time. Patient left upright in the wheelchair with alarm on and all needs within reach. Continue with current  plan of care.      Pain Pain Assessment Pain Scale: 0-10 Pain Score: 0-No pain  Therapy/Group: Individual Therapy  Eliyas Suddreth 08/25/2019, 12:32 PM

## 2019-08-25 NOTE — Progress Notes (Signed)
Occupational Therapy Weekly Progress Note  Patient Details  Name: Gary [name] MRN: [id number] Date of Birth: [birth year]  Beginning of progress report period: July 19, 2019 End of progress report period: July 25, 2019  Today's Date: 08/25/2019 OT Individual Time: 5027-7412 OT Individual Time Calculation (min): 40 min    Patient has met 4 of 5 short term goals.  Pt has made good progress towards OT goals. Goal have been upgraded to reflect overall supervision level. Pt requires CGA with VC for all transfers/mobility with RW during ADLs, CGA for standing balance during ADLs and VC for seated ADL performance for safety. Pt continues to demo diplopia, BPPV symptoms, decreased procressing speed and decreased balance/ safety awareness impacting safety and independence with BADLs  Patient continues to demonstrate the following deficits: muscle weakness, decreased cardiorespiratoy endurance, impaired timing and sequencing, decreased coordination and decreased motor planning, decreased visual perceptual skills and decreased visual motor skills, decreased motor planning, decreased initiation, decreased attention, decreased awareness, decreased problem solving, decreased safety awareness, decreased memory and delayed processing and decreased sitting balance, decreased standing balance, decreased postural control and decreased balance strategies and therefore will continue to benefit from skilled OT intervention to enhance overall performance with BADL and iADL.  Patient progressing toward long term goals..  Plan of care revisions: all goals upgraded to supervision level.  OT Short Term Goals Week 1:  OT Short Term Goal 1 (Week 1): Pt will initate threading 1LE into pants OT Short Term Goal 1 - Progress (Week 1): Met OT Short Term Goal 2 (Week 1): Pt will stand pivot transfer to toilet wiht MOD A of 1 to decrease caregiver burden OT Short Term Goal 2 - Progress (Week 1): Met OT Short Term Goal 3  (Week 1): Pt will bathe UB with mod VC for sequencing OT Short Term Goal 3 - Progress (Week 1): Met OT Short Term Goal 4 (Week 1): Pt will complete UB dressing with MOD A OT Short Term Goal 4 - Progress (Week 1): Met Week 2:  OT Short Term Goal 1 (Week 2): Pt will don pants wiht CGA for standing balance only OT Short Term Goal 2 (Week 2): Pt will thread BLE seated wiht no VC for positioning to demo improved safety awareness OT Short Term Goal 3 (Week 2): Pt will bathe with no more than min VC for sequencing to demo improved cognition OT Short Term Goal 4 (Week 2): Pt will groom in standing at sink to demo improved endurance with CGA OT Short Term Goal 5 (Week 2): Pt will transfer to toilet wiht CGA  Skilled Therapeutic Interventions/Progress Updates:    1:1. Pt received in bed agreeable to OT. Family arrived late but present half way through session for family education. Pt dons shoes with set up and ambulates in hallway with CGA to elevators with VC for wider step length and looking forward when walking and meet family in hallway. In ADL apartment OT reviews RW managmenet, safety awareness, hand placement, shower stall transfers (posterior method), shower adaptations, seated bathing strategies on BSC and CGA steadying strategies. Gary [name], wife, able to cue and ambulate with pt to/from shower stall through transfer cueing correctly with no A from OT. Exited session with p tseated in TIS, exit alarm on and call light in reach awaiting SLP.   Therapy Documentation Precautions:  Precautions Precautions: Fall Precaution Comments: cortrak, BUE mittens on eval Required Braces or Orthoses: Other Brace Other Brace: posey belt in chair Restrictions Weight Bearing Restrictions: No  General:   Vital Signs: Therapy Vitals Pulse Rate: 85 Resp: 18 BP: (!) 146/74 Patient Position (if appropriate): Lying Oxygen Therapy SpO2: 99 % O2 Device: Room Air Pain: Pain Assessment Pain Score: 0-No pain ADL:  ADL Upper Body Bathing: Maximal assistance Where Assessed-Upper Body Bathing: Sitting at sink Lower Body Bathing: Dependent Where Assessed-Lower Body Bathing: Standing at sink, Sitting at sink Upper Body Dressing: Dependent Where Assessed-Upper Body Dressing: Sitting at sink Lower Body Dressing: Dependent (+2 sit to stand) Where Assessed-Lower Body Dressing: Sitting at sink, Standing at sink Toileting: Dependent (+2) Where Assessed-Toileting: Glass blower/designer: Dependent (+2) Toilet Transfer Method: Arts development officer: Grab bars ADL Comments: +2 A for mobility for safety- able to sit to stand wiht MOD A, however motor planning deficits impacting safety during pivot and stand.sit transitions Vision   Perception    Praxis   Exercises:   Other Treatments:     Therapy/Group: Individual Therapy  Gary [name] 08/25/2019, 6:48 AM

## 2019-08-25 NOTE — Progress Notes (Signed)
Physical Therapy Weekly Progress Note  Patient Details  Name: Gary [name] MRN: [id number] Date of Birth: Aug 27, 1945  Beginning of progress report period: August 18, 2019 End of progress report period: August 25, 2019  Today's Date: 08/25/2019 PT Individual Time: 1345-1445 PT Individual Time Calculation (min): 60 min   Patient has met 3 of 3 short term goals.  Patient is making steady progress towards long term goals. Demonstrating improved awareness of his deficits, memory, and attention this week. He currently requires supervision for bed mobility and transfers, CGA-supervision gait with RW, min A gait without RW >150 ft, and min A-CGA 11 steps using 1 rail. Patient has been limited by diplopia and dizziness this week. Vestibular evaluation performed and canalith repositioning maneuvers initiatied as intervention this week.  Patient continues to demonstrate the following deficits muscle weakness, decreased cardiorespiratoy endurance, impaired timing and sequencing and decreased coordination, decreased visual acuity and decreased visual perceptual skills, decreased initiation, decreased attention, decreased awareness, decreased problem solving, decreased safety awareness and decreased memory, peripheral and decreased standing balance, decreased postural control and decreased balance strategies and therefore will continue to benefit from skilled PT intervention to increase functional independence with mobility.  Patient progressing toward long term goals.  Continue plan of care.  PT Short Term Goals Week 2:  PT Short Term Goal 1 (Week 2): Patient will perform basic transfers with CGA consistently using LRAD. PT Short Term Goal 1 - Progress (Week 2): Met PT Short Term Goal 2 (Week 2): Patient will ambulate >100 feet with CGA using LRAD. PT Short Term Goal 2 - Progress (Week 2): Met PT Short Term Goal 3 (Week 2): Patient will attend to a task in standing >5 min with CGA. PT Short Term Goal  3 - Progress (Week 2): Met Week 3:  PT Short Term Goal 1 (Week 3): Patient will perform basic transfers with supervision consitently using LRAD. PT Short Term Goal 2 (Week 3): Patient will ambulate >100 feet with supervision using LRAD. PT Short Term Goal 3 (Week 3): Patient will perform 16 steps with min A using L rail.  Skilled Therapeutic Interventions/Progress Updates:     Session 1: Patient in TIS w/c with his daughter and wife present for family education upon PT arrival. Patient alert and agreeable to PT session. Patient denied pain during session. Patient continues to report diplopia with double/triple vision as main complaint. Educated patient and his family on his present deficits limiting functional mobility and safety at this time. Also, educated on patient's gains in cognitive function and mobility since admission and expected continued gains upon d/c. Educated on TBI recovery, life-style changes, fluctuation of symptoms, need for consistent schedule, rest breaks, and increased time to prepare for activities. Provided strategies for management of overstimulation and poor frustration tolerance and educated on times the patient may have more difficulty on management of these symptoms. Discussed visual deficits and described role of neuro optomoligist and vision therapy. 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 Patient and family attentive and engaged in appropriate questions throughout education.    Therapeutic Activity: Transfers: Patient performed sit to/from stand x3 with supervision using the RW. Provided verbal cues for family to remain within arms reach to guard patient for safety/balance. Patient performed a simulated SUV height car transfer with CGA using RW. Provided cues and demonstration for safe technique and guarding.  Gait Training:  Patient ambulated >250 feet  x2 using RW with supervision for safety. Ambulated with narrow BOS, worse with fatigue, decreased step height and step length R>L, decreased gait speed, and forward trunk lean. Provided verbal cues for safe use of AD, increased BOS and step height to reduce fall risk. Family provided safe guarding within arms reach on second trial and followed PT demonstration for cues appropriately.  Patient ambulated up/down a ramp, over 10 feet of mulch (unlevel surface), and up/down a curb to simulate community ambulation over unlevel surfaces with CGA using RW. Provided demonstration cues for technique and use of AD. Patient performed x1 with PT demonstrating guarding technique and x1 with his wife providing safe guarding throughout with min cues. Patient ascending/descended 11 steps in the stairwell using L rail with B UE support with CGA-min A. Performed step-to gait pattern leading with R while ascending and L while descending, had patient face forward angle on descent due to poor foot placement with side-stepping x2. Provided cues for technique and sequencing. Patient fatigued following first trial. Will continue stair training with family next week. Planned for Wednesday afternoon to aconitate family's schedule.  Patient in bed with his family and Telesitter in the room at end of session with breaks locked, bed alarm, and all needs within reach.   Session 2: Patient in bed upon PT arrival. Patient alert and agreeable to PT session. Patient denied pain during session.  Therapeutic Activity: Bed Mobility: Patient performed supine to/from sit with supervision-mod I in a flat bed without use of bed rails.  Transfers: Patient performed sit to/from stand x7 and toilet transfer x1 with supervision for safety/balance without AD. Provided verbal cues for forward weight shift due to mild posterior bias x2. Patient was continent of bowl and bladder during toileting, performed peri-care and LB clothing management  independently.   Gait Training:  Patient ambulated >300 feet and >150 feet without with CGA and min A for lateral LOB x2 without AD. Ambulated as above. Provided verbal cues for erect posture, increased visual scanning, increase BOS and step height to reduce fall risk, and increased gait speed and arm swing for improved balance.  Neuromuscular Re-ed: Patient performed the following dynamic gait activities: -weaving between 8 cones 2 feet apart down and back with CGA and intermittent min A for balance  Trial 1: hit last 4 cones, reported decreased visual acuity when he started to run into the cones  Trial 2: hit 1 cone, missed 1 cone, improved accuracy with low level distraction (walking while talking)  Trial 3: hit last cone, cues to perform task as quickly as possible without distraction  Vestibular Re-assessment: Tested R and L Dix hallpike for anterior and posterior canal and performed Head Roll Test for horizontal canal testing. All negative for symptoms and nystagmus. No intervention indicated at this time. Patient does continue to report having dizziness intermittently today when getting in/out of bed and is taking Meclizine at this time. Will continue to monitor patient for symptoms and re-assess if symptoms occur.   Patient in bed with his daughter in the room at end of session with breaks locked, bed alarm set, and all needs within reach.    Therapy Documentation Precautions:  Precautions Precautions: Fall Precaution Comments: cortrak, BUE mittens on eval Required Braces or Orthoses: Other Brace Other Brace: posey belt in chair Restrictions Weight Bearing Restrictions: No  Therapy/Group: Individual Therapy  Caterina Racine L Cornel Werber 08/25/2019, 3:53 PM

## 2019-08-25 NOTE — Progress Notes (Signed)
Patient ID: Gary [name], male   DOB: February 24, 1945, 74 y.o.   MRN: [id number]  SW called pt to check and see how well family edu went. Stated all went well. Wife continued to express concerns about pt care needs at night and if there would be assistance. SW informed WC was working on this, and she was encouraged to call them as well. SW informed about pt now having [place] instead based on concerns she presented.  SW spoke with  RN/CM Meredeth Ide ([phone]/[phone])to discuss on above. States she is working on 24/.7 care and waiting to hear from adjuster on where to order DME.   Loralee Pacas, MSW, [place] Office: [phone] Cell: [phone] Fax: [phone]
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
## 2019-08-26 ENCOUNTER — Inpatient Hospital Stay (HOSPITAL_COMMUNITY): Payer: Medicare Other

## 2019-08-26 ENCOUNTER — Inpatient Hospital Stay (HOSPITAL_COMMUNITY): Payer: Medicare Other | Admitting: Speech Pathology

## 2019-08-26 NOTE — Progress Notes (Signed)
Occupational Therapy Session Note  Patient Details  Name: Gary [name] MRN: [id number] Date of Birth: 1945/06/05  Today's Date: 08/26/2019 OT Individual Time: 0930-1030 OT Individual Time Calculation (min): 60 min    Short Term Goals: Week 1:  OT Short Term Goal 1 (Week 1): Pt will initate threading 1LE into pants OT Short Term Goal 1 - Progress (Week 1): Met OT Short Term Goal 2 (Week 1): Pt will stand pivot transfer to toilet wiht MOD A of 1 to decrease caregiver burden OT Short Term Goal 2 - Progress (Week 1): Met OT Short Term Goal 3 (Week 1): Pt will bathe UB with mod VC for sequencing OT Short Term Goal 3 - Progress (Week 1): Met OT Short Term Goal 4 (Week 1): Pt will complete UB dressing with MOD A OT Short Term Goal 4 - Progress (Week 1): Met  Skilled Therapeutic Interventions/Progress Updates:    1:1 Pt received in bed still endorsing BPPV symptoms, but agreeable to bathing and dressing at shower level. Pt completes all mobiltiy with RW to/from bathroom with CGA fading to S with VC for RW management and reaching back to sit onto shower seat/bed. Pt completes bathing with VC for seated positioning except for peri bathing. Pt completes dressing on BSC with S for locating items d/t disturbance in vision. Pt able ot don all clothign with S-CGA with RW and increased time to problem solve laces of shoes. Pt completes functional mobility in hallway around objects with RW for increased endurance as well and simulated close quarters ombiliyt. Exited sessionw ith tp seated in bed, exit alamr on and call light itn reach   Therapy Documentation Precautions:  Precautions Precautions: Fall Precaution Comments: cortrak, BUE mittens on eval Required Braces or Orthoses: Other Brace Other Brace: posey belt in chair Restrictions Weight Bearing Restrictions: No General:   Vital Signs: Therapy Vitals Temp: 97.8 F (36.6 C) Temp Source: Oral Pulse Rate: 92 Resp: 18 BP: 121/76 Patient  Position (if appropriate): Lying Oxygen Therapy SpO2: 99 % O2 Device: Room Air Pain: Pain Assessment Pain Scale: 0-10 Pain Score: 6  Pain Type: Acute pain Pain Location: Face Pain Orientation: Left Pain Descriptors / Indicators: Headache Pain Onset: Gradual Patients Stated Pain Goal: 0 Pain Intervention(s): Medication (See eMAR) ADL: ADL Upper Body Bathing: Maximal assistance Where Assessed-Upper Body Bathing: Sitting at sink Lower Body Bathing: Dependent Where Assessed-Lower Body Bathing: Standing at sink, Sitting at sink Upper Body Dressing: Dependent Where Assessed-Upper Body Dressing: Sitting at sink Lower Body Dressing: Dependent (+2 sit to stand) Where Assessed-Lower Body Dressing: Sitting at sink, Standing at sink Toileting: Dependent (+2) Where Assessed-Toileting: Glass blower/designer: Dependent (+2) Toilet Transfer Method: Arts development officer: Grab bars ADL Comments: +2 A for mobility for safety- able to sit to stand wiht MOD A, however motor planning deficits impacting safety during pivot and stand.sit transitions Vision   Perception    Praxis   Exercises:   Other Treatments:     Therapy/Group: Individual Therapy  [name] 08/26/2019, 10:14 AM

## 2019-08-26 NOTE — Plan of Care (Signed)
  Problem: Consults Goal: RH BRAIN INJURY PATIENT EDUCATION Description: Description: See Patient Education module for eduction specifics Outcome: Progressing Goal: Skin Care Protocol Initiated - if Braden Score 18 or less Description: If consults are not indicated, leave blank or document N/A Outcome: Progressing Goal: Nutrition Consult-if indicated Outcome: Progressing   Problem: RH BOWEL ELIMINATION Goal: RH STG MANAGE BOWEL WITH ASSISTANCE Description: STG Manage Bowel with min Assistance. Outcome: Progressing Goal: RH STG MANAGE BOWEL W/MEDICATION W/ASSISTANCE Description: STG Manage Bowel with Medication with mod Assistance. Outcome: Progressing   Problem: RH BLADDER ELIMINATION Goal: RH STG MANAGE BLADDER WITH ASSISTANCE Description: STG Manage Bladder With min Assistance Outcome: Progressing   Problem: RH SKIN INTEGRITY Goal: RH STG MAINTAIN SKIN INTEGRITY WITH ASSISTANCE Description: STG Maintain Skin Integrity With min Assistance. Outcome: Progressing   Problem: RH SAFETY Goal: RH STG ADHERE TO SAFETY PRECAUTIONS W/ASSISTANCE/DEVICE Description: STG Adhere to Safety Precautions With mod Assistance/Device. Outcome: Progressing Goal: RH STG DECREASED RISK OF FALL WITH ASSISTANCE Description: STG Decreased Risk of Fall With min Assistance. Outcome: Progressing   Problem: RH COGNITION-NURSING Goal: RH STG USES MEMORY AIDS/STRATEGIES W/ASSIST TO PROBLEM SOLVE Description: STG Uses Memory Aids/Strategies With min Assistance to Problem Solve. Outcome: Progressing Goal: RH STG ANTICIPATES NEEDS/CALLS FOR ASSIST W/ASSIST/CUES Description: STG Anticipates Needs/Calls for Assist With min Assistance/Cues. Outcome: Progressing   Problem: RH PAIN MANAGEMENT Goal: RH STG PAIN MANAGED AT OR BELOW [name] PAIN GOAL Description: Pt pain will be managed at stated goal of 2 Outcome: Progressing   Problem: RH KNOWLEDGE DEFICIT BRAIN INJURY Goal: RH STG INCREASE KNOWLEDGE OF SELF  CARE AFTER BRAIN INJURY Outcome: Progressing Goal: RH STG INCREASE KNOWLEDGE OF DYSPHAGIA/FLUID INTAKE Outcome: Progressing

## 2019-08-26 NOTE — Progress Notes (Signed)
Speech Language Pathology Weekly Progress and Session Note  Patient Details  Name: Gary [name] MRN: [id number] Date of Birth: January 29, 1945  Beginning of progress report period: August 19, 2019 End of progress report period: August 26, 2019  Today's Date: 08/26/2019 SLP Individual Time: 1100-1155 SLP Individual Time Calculation (min): 55 min  Short Term Goals: Week 2: SLP Short Term Goal 1 (Week 2): Patient will consume current diet with minimal overt s/s of aspiration and Min A verbal cues for use of swallowing compensatory strategies. SLP Short Term Goal 1 - Progress (Week 2): Met SLP Short Term Goal 2 (Week 2): Patient will consume trials of thin liquids without overt s/s of aspiration in 75% of trials over 2 sessions to assess readiness for repeat MBS. SLP Short Term Goal 2 - Progress (Week 2): Met SLP Short Term Goal 3 (Week 2): Patient will utilize an increased vocal intensity at the word level to achieve ~75% intelligibility with Mod A verbal cues. SLP Short Term Goal 3 - Progress (Week 2): Met SLP Short Term Goal 4 (Week 2): Patient will sustain attention to functional tasks for 15 minutes with Mod verbal cues for redirection. SLP Short Term Goal 4 - Progress (Week 2): Met SLP Short Term Goal 5 (Week 2): Patient will demonstrate functional problem solving for basic and familiar tasks with Mod A verbal cues. SLP Short Term Goal 5 - Progress (Week 2): Met SLP Short Term Goal 6 (Week 2): Patient will utilize external memory aids to recall daily information with Max A multimodal cues. SLP Short Term Goal 6 - Progress (Week 2): Met    New Short Term Goals: Week 3: SLP Short Term Goal 1 (Week 3): STGs=LTGS due to ELOS  Weekly Progress Updates: Patient continues to make excellent gains and has met 6 of 6 STGs this admission. Currently, patient demonstrates behaviors consistent with a Rancho Level VII and requires overall Min-Mod A verbal cues to complete functional and familiar tasks  safely in regards to selective attention, functional problem solving, emergent awareness and recall of functional information. Patient demonstrates improved vocal intensity and patient is 100% intelligible at the conversation level. Patient also demonstrates improved swallowing function and is consuming Dys. 3 textures with thin liquids with full supervision for utilization of swallowing compensatory strategies. Patient and family education is ongoing. Patient would benefit from continued skilled SLP intervention to maximize his cognitive and swallowing function and overall functional independence prior to discharge.      Intensity: Minumum of 1-2 x/day, 30 to 90 minutes Frequency: 3 to 5 out of 7 days Duration/Length of Stay: 09/05/19 Treatment/Interventions: Cognitive remediation/compensation;Dysphagia/aspiration precaution training;Internal/external aids;Therapeutic Activities;Environmental controls;Cueing hierarchy;Functional tasks;Patient/family education   Daily Session  Skilled Therapeutic Interventions: Skilled treatment session focused on cognitive and dysphagia goals. SLP facilitated session by providing a pair of reading glasses with the left eye taped in order to maximize visual acuity/clearity. Patient reported that he was able to read but everything continues to look "bizarre." Educated that the goal is for function and not really perfection at this time. Patient verbalized understanding. Patient completed basic money management tasks with overall supervision level verbal cues. SLP also facilitated session by providing a trial tray of Dys. 3 textures. Patient consumed meal without overt s/s of aspiration, therefore, recommend patient upgrade to Dys. 3 textures. Patient demonstrated divided attention between functional conversation and self-feeding with overall supervision level verbal cues. Patient left upright in bed with alarm on and all needs within reach. Continue with current plan of  care.      Pain No/Denies Pain   Therapy/Group: Individual Therapy  Gary [name] 08/26/2019, 6:14 AM

## 2019-08-26 NOTE — Progress Notes (Signed)
Physical Therapy Session Note  Patient Details  Name: Gary [name] MRN: [id number] Date of Birth: May 29, 1945  Today's Date: 08/26/2019 PT Individual Time: 1305-1420 PT Individual Time Calculation (min): 75 min   Short Term Goals: Week 1:  PT Short Term Goal 1 (Week 1): Pt will complete bed<>w/c with max assist +1. PT Short Term Goal 1 - Progress (Week 1): Met PT Short Term Goal 2 (Week 1): Pt will ambulate 20 ft with LRAD & +2 assist. PT Short Term Goal 2 - Progress (Week 1): Met PT Short Term Goal 3 (Week 1): Pt will complete bed mobility with max assist +1. PT Short Term Goal 3 - Progress (Week 1): Met Week 2:  PT Short Term Goal 1 (Week 2): Patient will perform basic transfers with CGA consistently using LRAD. PT Short Term Goal 1 - Progress (Week 2): Met PT Short Term Goal 2 (Week 2): Patient will ambulate >100 feet with CGA using LRAD. PT Short Term Goal 2 - Progress (Week 2): Met PT Short Term Goal 3 (Week 2): Patient will attend to a task in standing >5 min with CGA. PT Short Term Goal 3 - Progress (Week 2): Met Week 3:  PT Short Term Goal 1 (Week 3): Patient will perform basic transfers with supervision consitently using LRAD. PT Short Term Goal 2 (Week 3): Patient will ambulate >100 feet with supervision using LRAD. PT Short Term Goal 3 (Week 3): Patient will perform 16 steps with min A using L rail.  Skilled Therapeutic Interventions/Progress Updates:    PAIN  Denies pain this pm  Pt initially supine w/wife in room. Supine to sit w/supervision.  STS w/cga and mult gait trials as follows:  125f w/cga to gym, mild R lean and decreased BOS w/R initial contact towards midline at times.  Gait x >3588fw/cga and dual task of naming cities when provided w/first letter, deviations increase w/fatigue, pt unaware of mild lean but able to distinguish when cued to listen for foot clearance and attend to tendency toward wall on R.  Gait >35079f/cga w/changes in speed from  normal to fastest comfortable to slow w/mult changes, stop/starts, pivot turns all w/cga mild balance instability but recovers w/cga only.  retrogait x [id number], decreased base on R/mild decreased coordiation/placment Static tandem stance R/L and L/R w/cga    Balance: Functional gait weaving thru cones placed at 4ft 4fervals, stepping on then over 3inch step, standing tapping step x 5 each all w/cga, stepping over 4in step requires min assist due to R lean w/task.   Repeated x 4 passes.    Picking up objects from floor x 6 w/cga to min assist, pt c/o feeling slightly dizzy w/task, rested in sitting and recovered.  Standing on foam dual task of naming  states and animals given first letter as challenge - pt requires cuing approx 30% time and cga to min assist w/balance, increased sway varies in magnitude.  Pt able to stand x 10 min w/this task, offered rest breaks but pt requested to continue.  Pt verbalized his surprise at how difficult cognitive task was "I cant believe I go blank like this".    At end of session, pt ambulated back to room w/cga as above.  Turn/sit to edge of bed w/cga.  Removes shoes independently.  Sit to supine w/supervision.  Handed off to NT for vitals.  Continues to demonstrate improvements in all functional mobility tasks.  Dual task physical+cognitive activities challenging.  Therapy Documentation Precautions:  Precautions  Precautions: Fall Precaution Comments: cortrak, BUE mittens on eval Required Braces or Orthoses: Other Brace Other Brace: posey belt in chair Restrictions Weight Bearing Restrictions: No    Therapy/Group: Individual Therapy  Callie Fielding, [place] 08/26/2019, 2:45 PM
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
## 2019-08-26 NOTE — Progress Notes (Signed)
Patient care plan enforced and education done.  Audie Clear, LPN

## 2019-08-26 NOTE — Plan of Care (Signed)
All goals upgraded to supervision d/t progress SMS OT   Problem: RH Balance Goal: LTG Patient will maintain dynamic standing with ADLs (OT) Description: LTG:  Patient will maintain dynamic standing balance with assist during activities of daily living (OT)  Flowsheets (Taken 08/26/2019 0646) LTG: Pt will maintain dynamic standing balance during ADLs with: Supervision/Verbal cueing   Problem: RH Bathing Goal: LTG Patient will bathe all body parts with assist levels (OT) Description: LTG: Patient will bathe all body parts with assist levels (OT) Flowsheets (Taken 08/26/2019 0646) LTG: Pt will perform bathing with assistance level/cueing: Supervision/Verbal cueing   Problem: RH Dressing Goal: LTG Patient will perform lower body dressing w/assist (OT) Description: LTG: Patient will perform lower body dressing with assist, with/without cues in positioning using equipment (OT) Flowsheets (Taken 08/26/2019 0646) LTG: Pt will perform lower body dressing with assistance level of: Supervision/Verbal cueing   Problem: RH Toileting Goal: LTG Patient will perform toileting task (3/3 steps) with assistance level (OT) Description: LTG: Patient will perform toileting task (3/3 steps) with assistance level (OT)  Flowsheets (Taken 08/26/2019 0646) LTG: Pt will perform toileting task (3/3 steps) with assistance level: Supervision/Verbal cueing   Problem: RH Toilet Transfers Goal: LTG Patient will perform toilet transfers w/assist (OT) Description: LTG: Patient will perform toilet transfers with assist, with/without cues using equipment (OT) Flowsheets (Taken 08/26/2019 0646) LTG: Pt will perform toilet transfers with assistance level of: Supervision/Verbal cueing   Problem: RH Tub/Shower Transfers Goal: LTG Patient will perform tub/shower transfers w/assist (OT) Description: LTG: Patient will perform tub/shower transfers with assist, with/without cues using equipment (OT) Flowsheets (Taken 08/26/2019  0646) LTG: Pt will perform tub/shower stall transfers with assistance level of: Supervision/Verbal cueing

## 2019-08-27 ENCOUNTER — Inpatient Hospital Stay (HOSPITAL_COMMUNITY): Payer: Medicare Other | Admitting: Occupational Therapy

## 2019-08-27 NOTE — Plan of Care (Signed)
  Problem: RH BLADDER ELIMINATION Goal: RH STG MANAGE BLADDER WITH ASSISTANCE Description: STG Manage Bladder With min Assistance Outcome: Not Progressing; per report frequency at night ; will observe    Problem: RH SAFETY Goal: RH STG ADHERE TO SAFETY PRECAUTIONS W/ASSISTANCE/DEVICE Description: STG Adhere to Safety Precautions With mod Assistance/Device. Outcome: Not Progressing; telesitter
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
## 2019-08-27 NOTE — Progress Notes (Signed)
Occupational Therapy Session Note  Patient Details  Name: Gary [name] MRN: [id number] Date of Birth: 04-Mar-1945  Today's Date: 08/27/2019 OT Individual Time: 1000-1055 OT Individual Time Calculation (min): 55 min    Short Term Goals: Week 1:  OT Short Term Goal 1 (Week 1): Pt will initate threading 1LE into pants OT Short Term Goal 1 - Progress (Week 1): Met OT Short Term Goal 2 (Week 1): Pt will stand pivot transfer to toilet wiht MOD A of 1 to decrease caregiver burden OT Short Term Goal 2 - Progress (Week 1): Met OT Short Term Goal 3 (Week 1): Pt will bathe UB with mod VC for sequencing OT Short Term Goal 3 - Progress (Week 1): Met OT Short Term Goal 4 (Week 1): Pt will complete UB dressing with MOD A OT Short Term Goal 4 - Progress (Week 1): Met Week 2:  OT Short Term Goal 1 (Week 2): Pt will don pants wiht CGA for standing balance only OT Short Term Goal 1 - Progress (Week 2): Met OT Short Term Goal 2 (Week 2): Pt will thread BLE seated wiht no VC for positioning to demo improved safety awareness OT Short Term Goal 2 - Progress (Week 2): Progressing toward goal OT Short Term Goal 3 (Week 2): Pt will bathe with no more than min VC for sequencing to demo improved cognition OT Short Term Goal 3 - Progress (Week 2): Met OT Short Term Goal 4 (Week 2): Pt will groom in standing at sink to demo improved endurance with CGA OT Short Term Goal 4 - Progress (Week 2): Met OT Short Term Goal 5 (Week 2): Pt will transfer to toilet wiht CGA OT Short Term Goal 5 - Progress (Week 2): Met Week 3:  OT Short Term Goal 1 (Week 3): Pt will thread BLE  into pants in seated position with no VC for demoing improved safety awareness OT Short Term Goal 2 (Week 3): Pt will bathe LB with no VC for seated position to demo improved safety awareness OT Short Term Goal 3 (Week 3): Pt will sequence bathing body parts wiht set up OT Short Term Goal 4 (Week 3): Pt will manage RW with no more than min VC for  improved RW managment  Skilled Therapeutic Interventions/Progress Updates:    1:1 Pt was in bed when arrived. continued to further assess diplopia in different fields.  Attempts some conversion exercises but was unsuccessful. With reading glasses tapping over left eye pt able to engaged in The Pill Box Test for the purpose of both assessment and for treatment. Pt made more that 5 errors. Pt made his mistakes in the area of misplaced movement errors. He was able to self correct any commission errors made. Pt did required mod cues to confirm he was correct with his method and then would often repeat himself multiple times. Pt demonstrated emergent awareness reporting this activity was harder than he thought it should be for him and would require help. Pt with recall of why he is here (what ppl have told him). Pt not oriented to time of day, day of week or how long he had been in the hospital.   Pt left resting in the bed.   Therapy Documentation Precautions:  Precautions Precautions: Fall Precaution Comments: cortrak, BUE mittens on eval Required Braces or Orthoses: Other Brace Other Brace: posey belt in chair Restrictions Weight Bearing Restrictions: No Pain:  reports some swelling over right eye  Therapy/Group: Individual Therapy  Willeen Cass  [place] 08/27/2019, 12:28 PM

## 2019-08-28 ENCOUNTER — Inpatient Hospital Stay (HOSPITAL_COMMUNITY): Payer: Medicare Other | Admitting: Speech Pathology

## 2019-08-28 MED ORDER — PANTOPRAZOLE SODIUM 40 MG PO TBEC
40.0000 mg | DELAYED_RELEASE_TABLET | Freq: Every day | ORAL | Status: DC
  Administered 2019-08-29 – 2019-09-04 (×7): 40 mg via ORAL
  Filled 2019-08-28 (×7): qty 1
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
## 2019-08-28 NOTE — Plan of Care (Signed)
  Problem: RH BOWEL ELIMINATION Goal: RH STG MANAGE BOWEL WITH ASSISTANCE Description: STG Manage Bowel with min Assistance. Outcome: Not Progressing;LBM 8/19   Problem: RH SAFETY Goal: RH STG ADHERE TO SAFETY PRECAUTIONS W/ASSISTANCE/DEVICE Description: STG Adhere to Safety Precautions With mod Assistance/Device. Outcome: Not Progressing; telesitter

## 2019-08-28 NOTE — Progress Notes (Signed)
Speech Language Pathology Daily Session Note  Patient Details  Name: Gary [name] MRN: [id number] Date of Birth: November 15, 1945  Today's Date: 08/28/2019 SLP Individual Time: 1026-1100 SLP Individual Time Calculation (min): 34 min  Short Term Goals: Week 3: SLP Short Term Goal 1 (Week 3): STGs=LTGS due to ELOS  Skilled Therapeutic Interventions:  Pt was seen for skilled ST targeting cognitive goals.  Upon arrival, pt was in bed but requested to participate in therapy sitting up in wheelchair.  Pt was transferred and SLP facilitated the session with a functional snack of dys 3 textures and thin liquids to monitor toleration of currently prescribed diet.  Pt consumed solids and liquids with supervision cues for use of swallowing precautions and no overt s/s of aspiration.  PO intake was minimal but pt's wife reports that "eating and drinking" has been going well since diet has been advanced.  SLP also facilitated the session with a novel card game targeting use of memory strategies, specifically word-picture associations.  Pt required max assist to generate word-picture associations and could remember them after a brief delay following repetition of information via spaced retrieval training with mod-max assist.  Pt was left in wheelchair with wife at bedside.  Continue per current plan of care.    Pain Pain Assessment Pain Scale: 0-10 Pain Score: 0-No pain  Therapy/Group: Individual Therapy  Sung Parodi, Selinda Orion 08/28/2019, 2:53 PM

## 2019-08-29 ENCOUNTER — Inpatient Hospital Stay (HOSPITAL_COMMUNITY): Payer: Medicare Other

## 2019-08-29 ENCOUNTER — Inpatient Hospital Stay (HOSPITAL_COMMUNITY): Payer: No Typology Code available for payment source

## 2019-08-29 MED ORDER — MECLIZINE HCL 25 MG PO TABS
12.5000 mg | ORAL_TABLET | Freq: Three times a day (TID) | ORAL | Status: DC
  Administered 2019-08-29 – 2019-08-30 (×3): 12.5 mg via ORAL
  Filled 2019-08-29 (×3): qty 1

## 2019-08-29 MED ORDER — CALCIUM POLYCARBOPHIL 625 MG PO TABS
625.0000 mg | ORAL_TABLET | Freq: Every day | ORAL | Status: DC
  Administered 2019-08-29 – 2019-09-04 (×7): 625 mg via ORAL
  Filled 2019-08-29 (×7): qty 1

## 2019-08-29 NOTE — Progress Notes (Signed)
Occupational Therapy Session Note  Patient Details  Name: Gary [name] MRN: [id number] Date of Birth: 09-Apr-1945  Today's Date: 08/29/2019 OT Individual Time: 1300-1415 OT Individual Time Calculation (min): 75 min    Short Term Goals: Week 3:  OT Short Term Goal 1 (Week 3): Pt will thread BLE  into pants in seated position with no VC for demoing improved safety awareness OT Short Term Goal 2 (Week 3): Pt will bathe LB with no VC for seated position to demo improved safety awareness OT Short Term Goal 3 (Week 3): Pt will sequence bathing body parts wiht set up OT Short Term Goal 4 (Week 3): Pt will manage RW with no more than min VC for improved RW managment  Skilled Therapeutic Interventions/Progress Updates:    Pt received supine with wife present, eating lunch. Supervision provided with min cueing for bite size management. Pt overall doing good job of self monitoring eating and SLP strategies. PA entered room to assess pt and OT provided updates re vision 2/2 pt complaints from R eye swelling. Pt declined ADLs this session. Pt completed bed mobility with (S), min cueing for initiation. Pt completed 300 ft of functional mobility at CGA level with intermittent scissoring and staggering gait. Cueing provided for upright posture and slower pace. Pt completed 20 min on the Nustep for cognitive dual processing task. Cognitive task included reasoning, sequencing, STM, and mental manipulation, as well as safety awareness situations. Pt required min cueing overall but demonstrated great improvement in alternating attention and was 90% accurate in simple cognition tasks. Pt completed 2x 15 mini squats with overhead reach, min tactile cueing required for proper muscle activation. During session, alterations were made to pt's clear glasses with occlusion tape. Several trials were completed to trial partial vs total occlusion. Total occlusion was required to reduce vertical diplopia. Pt was left supine  with all needs met, bed alarm set.   Therapy Documentation Precautions:  Precautions Precautions: Fall Precaution Comments: cortrak, BUE mittens on eval Required Braces or Orthoses: Other Brace Other Brace: posey belt in chair Restrictions Weight Bearing Restrictions: No  Therapy/Group: Individual Therapy  Curtis Sites 08/29/2019, 6:38 AM

## 2019-08-29 NOTE — Progress Notes (Signed)
Physical Therapy Session Note  Patient Details  Name: Gary [name] MRN: [id number] Date of Birth: February 12, 1945  Today's Date: 08/29/2019 PT Individual Time: 0935-1050 PT Individual Time Calculation (min): 75 min   Short Term Goals: Week 3:  PT Short Term Goal 1 (Week 3): Patient will perform basic transfers with supervision consitently using LRAD. PT Short Term Goal 2 (Week 3): Patient will ambulate >100 feet with supervision using LRAD. PT Short Term Goal 3 (Week 3): Patient will perform 16 steps with min A using L rail.  Skilled Therapeutic Interventions/Progress Updates:     Patient in bed with his wife at bedside upon PT arrival. Patient alert and agreeable to PT session. Patient denied pain during session. Patient reports intermittent dizziness over the weekend and x1 during session, resolved ~30 sec. Unable to perform Canalith testing/repositioning this session due to patient's stomach being upset. Discusses with Dr. Ranell Patrick, MD, about performing testing tomorrow morning and holding Meclizine until after testing to ensure valid result. Patient also with frequent BMs, x3, during session, MD made aware.   Adjusted tape on occluded glasses to R side, patient reported improved double vision with blurred vision.   Therapeutic Activity: Bed Mobility: Patient performed supine to sit with supervision- mod I in a flat bed without bed rails. Transfers: Patient performed sit to/from stand transfers and toilet transfers throughout session with supervision with and without use of RW. Provided verbal cues for keeping RW with him when using it in the room and for toileting. Patient was continent of bowl x3 during session, performed peri-care and LB clothing management independently during toileting. [name] assisted the patient to/form the bathroom using the RW x1 demonstrating safe guarding a cues for patient safety. Cleared patient's wife for transfers to/from the bathroom, educated on  appropriate footwear with patient is ambulating in the room and use of RW for safety, RN made aware and safety sheet updated.    Gait Training:  Patient ambulated to/from the bathroom x3 using the RW with close supervision to simulate home ambulation and >100 feet x2 without an AD with CGA and min A x3 for lateral LOB due to scissoring gait. Ambulated with narrow BOS R>S, decreased gait speed, decreased step length and height R>L, and decreased arm swing and trunk rotation. Provided verbal cues for increased gait speed and step height, encouraged increased arm swing for improved balance and gait speed to reduced SLS time.  Neuromuscular Re-ed: Patient performed the following cognitive dual task activities: -step-taps to a red and purple cone, assigned categories: red-"colors" and purple "counting by 2's"  Trial 1: 2 min alternating feet and categories, restarted counting x1 otherwise maintained sequence in counting throughout, repeated colors x3  Trial 2: 2 min PT cued R/L and color, patient correct <25% of the time  Trial 3: 2 min as above with similar performance  Trial 3: 2 min performed as above in sitting to remove balance component, improved to correct <33% of the time, only performed counting for last few trials and noted visible signs of fatigue/frustration in  patient after  Educated patient on cognitive recovery and complexity of challenges performed today. Discussed progression and use of HEP at home and follow-up therapies to continue with physical and cognitive recovery. Discussed regular home schedule including therapy and breaks and practicing diaphragmatic breathing and taking breaks when things become frustrating.   Patient sitting EOB with his wife at bedside at end of session with breaks locked and all needs within reach.  Therapy Documentation Precautions:  Precautions Precautions: Fall Precaution Comments: cortrak, BUE mittens on eval Required Braces or Orthoses: Other  Brace Other Brace: posey belt in chair Restrictions Weight Bearing Restrictions: No   Therapy/Group: Individual Therapy  Tvisha Schwoerer L Letecia Arps PT, DPT  08/29/2019, 12:32 PM
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
## 2019-08-29 NOTE — Progress Notes (Signed)
Speech Language Pathology Daily Session Note  Patient Details  Name: Gary [name] MRN: [id number] Date of Birth: [birth year]  Today's Date: 08/29/2019 SLP Individual Time: 1103-1130 SLP Individual Time Calculation (min): 27 min  Short Term Goals: Week 3: SLP Short Term Goal 1 (Week 3): STGs=LTGS due to ELOS  Skilled Therapeutic Interventions: Skilled ST services focused on cognitive skills. Pt consumed regular textured trial snack with appropriate rate, oral clearances and no overt s/s aspiration. [name] wife reported no issues consuming current dys 3 textures and even regular textures snack brought in by family. SLP recommends regular texture tray if possible, however swallow function appears WFL and x1 regular snack trial could be administered prior to diet upgrade. Pt was unable to recall memory task from yesterday's ST session and required mod A verbal cues to recall events from today's Pt session. SLP educated and implemented memory notebook to aid in short term recall, after pt demonstrated ability to read at paragraph level and write at simple sentence level. Pt expressed continued "double vision" but was able to read with larger font. Pt required min A verbal cues to recall and then record events from today's ST session. SLP recommends to continue skilled ST services.      Pain Pain Assessment Pain Score: 0-No pain  Therapy/Group: Individual Therapy  Ganon Demasi  [facility] 08/29/2019, 12:19 PM
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
## 2019-08-30 ENCOUNTER — Inpatient Hospital Stay (HOSPITAL_COMMUNITY): Payer: Medicare Other | Admitting: Speech Pathology

## 2019-08-30 ENCOUNTER — Inpatient Hospital Stay (HOSPITAL_COMMUNITY): Payer: Medicare Other

## 2019-08-30 DIAGNOSIS — I10 Essential (primary) hypertension: Secondary | ICD-10-CM

## 2019-08-30 DIAGNOSIS — H532 Diplopia: Secondary | ICD-10-CM

## 2019-08-30 DIAGNOSIS — S069X3S Unspecified intracranial injury with loss of consciousness of 1 hour to 5 hours 59 minutes, sequela: Secondary | ICD-10-CM

## 2019-08-30 MED ORDER — MECLIZINE HCL 25 MG PO TABS: 12.5000 mg | ORAL_TABLET | Freq: Three times a day (TID) | ORAL | Status: DC | PRN

## 2019-08-30 NOTE — Patient Care Conference (Signed)
Inpatient RehabilitationTeam Conference and Plan of Care Update Date: 08/30/2019   Time: 10:01 AM    Patient Name: Gary [name]      Medical Record Number: [id number]  Date of Birth: 1946/01/02 Sex: Male         Room/Bed: 4W12C/4W12C-02 Payor Info: Payor: [employer] park manager / Plan: [employer] OTHER / Product Type: *No Product type* /    Admit Date/Time:  08/11/2019  5:16 PM  Primary Diagnosis:  TBI (traumatic brain injury) [facility])  Hospital Problems: Principal Problem:   TBI (traumatic brain injury) [facility]) Active Problems:   Malnutrition of moderate degree    Expected Discharge Date: Expected Discharge Date: 09/04/19  Team Members Present: Physician leading conference: Dr. Alger Simons Care Coodinator Present: Loralee Pacas, LCSWA;Algie Westry Creig Hines, RN, BSN, [place] Nurse Present: Mohammed Kindle, RN PT Present: Apolinar Junes, PT OT Present: Laverle Hobby, OT SLP Present: Weston Anna, SLP PPS Coordinator present : Ileana Ladd, PT     Current Status/Progress Goal Weekly Team Focus  Bowel/Bladder   Continet of B&B  Pt will remain continent of B&B whill at the hostipal  Assess pt for incontinence q shift and as needed.   Swallow/Nutrition/ Hydration   dys 3 textures and thin liquids, supervision A  Supervision least restrictive diet  Regular texture trials, use of swallow strategies and education   ADL's   Great improvements in attention, awareness, initiation. CGA- (S) transfers. Min cueing for safety awareness. CGA dressing and bathing  Supervision to min A  Cognitive retraining, d/c planning, safety awareness, functional activity tolerance, vision   Mobility   Supervision bed mobility and transfers, CGA-min A gait and stairs  Supervision overall, stairs CGA  Functional mobility, balance, gait and stair training, dynamic gait, safety awareness, attention, cognition, patient/caregiver education, d/c planning   Communication             Safety/Cognition/  Behavioral Observations  Mod-Min A  Min A  eductaion, problem solving, recall, attention and safety awareness   Pain   No C/O pain  Pt will remain pain free  Assess for pain q shift and as needed.   Skin   Skin is intant, no rash or breakdown noted.  Skin will remain intact  or prevent breakdown  Assess skin q shift and as needed.     Discharge Planning:  Pt is worker's comp.Worker's comp [employer]. Discharge to home with spouse. Pt has 1 daughter in the home to assist, and 1 daughter nearby available to assist. Pt has limited extended family/social support. Worker's comp will get DME delivered to the home, and will be working on providing 24/7 care for him as well as [facility] services.   Team Discussion: Continent B/B, Wife cleared to take patient to bathroom. Contact guard for ADL's with Supervision goals. Supervision with rolling walker. SLP - Dys 3/thin, possible upgrade in a couple more days. Patient on target to meet rehab goals: yes  *See Care Plan and progress notes for long and short-term goals.   Revisions to Treatment Plan:  None  Teaching Needs: Continue family education  Current Barriers to Discharge: None noted at this time.  Possible Resolutions to Barriers: N/A     Medical Summary Current Status: improving cognitive status. pain improving. still with double vision. sleep improved  Barriers to Discharge: Medical stability   Possible Resolutions to [employer] Focus: ongoing med adjustments, daily medical rounds, finalize planning for discharge   Continued Need for Acute Rehabilitation Level of Care: The patient requires daily medical management  by a physician with specialized training in physical medicine and rehabilitation for the following reasons: Direction of a multidisciplinary physical rehabilitation program to maximize functional independence : Yes Medical management of patient stability for increased activity during participation in an intensive  rehabilitation regime.: Yes Analysis of laboratory values and/or radiology reports with any subsequent need for medication adjustment and/or medical intervention. : Yes   I attest that I was present, lead the team conference, and concur with the assessment and plan of the team.   Cristi Loron 08/30/2019, 1:09 PM
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
## 2019-08-30 NOTE — Progress Notes (Signed)
Speech Language Pathology Daily Session Note  Patient Details  Name: Gary [name] MRN: [id number] Date of Birth: [birth year]  Today's Date: 08/30/2019 SLP Individual Time: 1055-1150 SLP Individual Time Calculation (min): 55 min  Short Term Goals: Week 3: SLP Short Term Goal 1 (Week 3): STGs=LTGS due to ELOS  Skilled Therapeutic Interventions: Skilled treatment session focused on dysphagia and cognitive goals. SLP facilitated session by providing trials of regular textures. Patient demonstrated efficient mastication with complete oral clearance without overt s/s of aspiration, therefore, recommend patient upgrade to regular textures. SLP also facilitated session by providing extra time and overall Mod A verbal cues for problem solving and recall during a complex organization task. Patient ambulated to and from the SLP office with supervision. Patient left upright in bed with alarm on and all needs within reach. Continue with current plan of care.      Pain No/Denies Pain   Therapy/Group: Individual Therapy  Ender Rorke 08/30/2019, 12:01 PM

## 2019-08-30 NOTE — Progress Notes (Addendum)
Physical Therapy Session Note  Patient Details  Name: Gary [name] MRN: [id number] Date of Birth: 20-Apr-1945  Today's Date: 08/30/2019 PT Individual Time: 0900-0956 PT Individual Time Calculation (min): 56 min   Short Term Goals: Week 3:  PT Short Term Goal 1 (Week 3): Patient will perform basic transfers with supervision consitently using LRAD. PT Short Term Goal 2 (Week 3): Patient will ambulate >100 feet with supervision using LRAD. PT Short Term Goal 3 (Week 3): Patient will perform 16 steps with min A using L rail.  Skilled Therapeutic Interventions/Progress Updates:     Patient in the bathroom with his wife upon PT arrival. Patient alert and agreeable to PT session. Patient denied pain during session. Reported having a headache this morning, however, had resolved at this time.  Therapeutic Activity: Bed Mobility: Patient performed supine to/from sit with supervision-mod I.  Transfers: Patient performed sit to/from stand x4 with supervision for safety.   Gait Training:  Patient ambulated >100 feet and >350 feet without an AD with CGA and min A x1 due to minor R lateral LOB. Ambulated with narrow BOS R>L, decreased gait speed, decreased step length and height, and decreased arms swing and trunk rotation. Provided verbal cues for increased BOS and increased arm swing for improved gait speed and balance. Patient ascended/descended 22 steps (2 flights in stairwell) using L rail with B UE support with CGA. Performed step-to gait pattern with intermittent step-through. Provided cues for technique and sequencing.   Vestibular Re-assessment: Tested R and L Dix hallpike for anterior and posterior canal and performed Head Roll Test for horizontal canal testing. All negative for symptoms and nystagmus. No intervention indicated at this time. Patient does continue to report having dizziness intermittently today when getting in/out of bed and is taking Meclizine at this time. Will continue to  monitor patient for symptoms and re-assess if symptoms occur.   Neuromuscular Re-education: Patient performed VOR x1 with letter A index card in sitting x2 min with improved ability to maintain gaze with head turns today. Then performed in standing 49min x2 with cues for increased speed of head turns and maintaining gaze on the letter A. Noted decreased ability to maintain gaze after 1 min both trials due to fatigue. Patient denied dizziness or change in symptoms throughout. Performed standing balance with head turns without gaze fixation x2 min without symptom provokation. Required CGA-close supervision for balance throughout standing activities.   Patient and his wife had several questions about his visual deficits and recovery. Provided general education on occular-motor deficits, deficits in visual acuity, and central visual deficits. Also, discussed role of neuro optometrist and vision therapy, and nuero recovery versus compensatory strategies. Patient and his wife appreciative of education.  Patient ambulating to the bathroom using RW with his wife at end of session.  Therapy Documentation Precautions:  Precautions Precautions: Fall Precaution Comments: cortrak, BUE mittens on eval Required Braces or Orthoses: Other Brace Other Brace: posey belt in chair Restrictions Weight Bearing Restrictions: No   Therapy/Group: Individual Therapy  Dorisann Schwanke L Tristin Vandeusen PT, DPT  08/30/2019, 12:26 PM
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
## 2019-08-30 NOTE — Progress Notes (Signed)
Occupational Therapy Session Note  Patient Details  Name: Gary [name] MRN: [id number] Date of Birth: [birth year]  Today's Date: 08/30/2019 OT Individual Time: 1300-1413 OT Individual Time Calculation (min): 73 min    Short Term Goals: Week 3:  OT Short Term Goal 1 (Week 3): Pt will thread BLE  into pants in seated position with no VC for demoing improved safety awareness OT Short Term Goal 2 (Week 3): Pt will bathe LB with no VC for seated position to demo improved safety awareness OT Short Term Goal 3 (Week 3): Pt will sequence bathing body parts wiht set up OT Short Term Goal 4 (Week 3): Pt will manage RW with no more than min VC for improved RW managment  Skilled Therapeutic Interventions/Progress Updates:    Pt received supine in bed, eating lunch with no c/o pain. Pt completed bed mobility to EOB with (S). Pt completed ambulatory transfer to the bathroom with close (S). Pt doffed shirt and pants with CGA, requiring cues to sit to doff pants completely. Pt transferred into shower and completed UB bathing seated with (S). CGA for standing balance to complete peri hygiene. Min cueing to wash hair seated. Pt with overshooting during reaching in shower. Pt doffed shirt with (S). Cueing to sit down to don pants, CGA overall. Pt completed oral care at the sink with close (S). Pt completed 150 ft of functional mobility ot the therapy gym with CGA, no AE. Pt completed dual processing task with dynamic standing balance and basic sequential numerical task. CGA- (S) provided throughout and several slight LOBs, pt able to self correct. Pt completed multiple trials with each graded up to increase balance and cognitive demands. Pt then completed dynavision activity for visual convergence and functional reaching. Pt with slight overshooting that he was able to correct himself with increased time- reaction time 2.67 overall. Pt wearing occluded glasses throughout. Pt then completed another trial with  cognitive component, using specific UE for different color buttons- reaction time 3.09 and increased difficulty with processing speed. Pt returned to his room and was left supine with all needs met, bed alarm set.   Therapy Documentation Precautions:  Precautions Precautions: Fall Precaution Comments: cortrak, BUE mittens on eval Required Braces or Orthoses: Other Brace Other Brace: posey belt in chair Restrictions Weight Bearing Restrictions: No  Therapy/Group: Individual Therapy  Curtis Sites 08/30/2019, 6:58 AM

## 2019-08-30 NOTE — Plan of Care (Signed)
  Problem: RH Balance Goal: LTG Patient will maintain dynamic sitting balance (PT) Description: LTG:  Patient will maintain dynamic sitting balance with assistance during mobility activities (PT) Outcome: Completed/Met   Problem: Sit to Stand Goal: LTG:  Patient will perform sit to stand with assistance level (PT) Description: LTG:  Patient will perform sit to stand with assistance level (PT) Outcome: Completed/Met   Problem: RH Bed Mobility Goal: LTG Patient will perform bed mobility with assist (PT) Description: LTG: Patient will perform bed mobility with assistance, with/without cues (PT). Outcome: Completed/Met   Problem: RH Bed to Chair Transfers Goal: LTG Patient will perform bed/chair transfers w/assist (PT) Description: LTG: Patient will perform bed to chair transfers with assistance (PT). Outcome: Completed/Met   Problem: RH Ambulation Goal: LTG Patient will ambulate in controlled environment (PT) Description: LTG: Patient will ambulate in a controlled environment, # of feet with assistance (PT). Flowsheets Taken 08/30/2019 0614 by Apolinar Junes L, PT LTG: Pt will ambulate in controlled environ  assist needed:: (upgraded goal due to patient's progress and improved balance with gait.) Supervision/Verbal cueing Taken 08/12/2019 1252 by Waunita Schooner, PT LTG: Ambulation distance in controlled environment: 150 ft with LRAD Note: upgraded goal due to patient's progress and improved balance with gait. Goal: LTG Patient will ambulate in home environment (PT) Description: LTG: Patient will ambulate in home environment, # of feet with assistance (PT). Flowsheets Taken 08/30/2019 0614 by Doreene Burke, PT LTG: Pt will ambulate in home environ  assist needed:: (upgraded goal due to patient's progress and improved balance with gait.) Supervision/Verbal cueing Taken 08/12/2019 1252 by Waunita Schooner, PT LTG: Ambulation distance in home environment: 50 ft with LRAD Note:  upgraded goal due to patient's progress and improved balance with gait.   Problem: RH Stairs Goal: LTG Patient will ambulate up and down stairs w/assist (PT) Description: LTG: Patient will ambulate up and down # of stairs with assistance (PT) Flowsheets Taken 08/30/2019 0614 by Apolinar Junes L, PT LTG: Pt will ambulate up/down stairs assist needed:: (upgraded goal due to patient's progress and improved balance.) Contact Guard/Touching assist Taken 08/12/2019 1252 by Waunita Schooner, PT LTG: Pt will  ambulate up and down number of stairs: flight of stairs with L rail (per home setup) Note: upgraded goal due to patient's progress and improved balance.

## 2019-08-31 ENCOUNTER — Inpatient Hospital Stay (HOSPITAL_COMMUNITY): Payer: Medicare Other | Admitting: Physical Therapy

## 2019-08-31 ENCOUNTER — Encounter (HOSPITAL_COMMUNITY): Payer: Medicare Other

## 2019-08-31 ENCOUNTER — Ambulatory Visit (HOSPITAL_COMMUNITY): Payer: Medicare Other

## 2019-08-31 ENCOUNTER — Inpatient Hospital Stay (HOSPITAL_COMMUNITY): Payer: Medicare Other | Admitting: Speech Pathology

## 2019-08-31 NOTE — Progress Notes (Signed)
Patient ID: Gary [name], male   DOB: 1945/07/22, 74 y.o.   MRN: [id number]  This NP visited patient at the bedside as a follow up for palliative medicine needs and emotional support.    Patient's daughter/Alisa   at bedside.    Patient is alert and in good spirits.  Looking forward to discharge home this week-end    Created space and opportunity for patient  to explore his thoughts and feelings regarding current medical situation.  Understandably this is an overwhelming situation.    Emotional support offered  Education offered regarding the ongoing medical challenges and steps to recovery.   Conversation/education regarding positive attitude/ motivation and mind, body,  spirit connection.  Discussed with patient the importance of continued conversation with his family and their  medical providers regarding overall plan of care and treatment options,  ensuring decisions are within the context of the patients values and GOCs. Emphasized  importance of documentation of  advanced directives    Questions and concerns addressed   Left message for wife with call back contact info and encouraged her to call with questions or concerns now or into the future.  Total time spent on the unit was 25 minutes  PMT will sign off  Wadie Lessen NP  Palliative Medicine Team Team Phone # [phone] Pager [phone]

## 2019-08-31 NOTE — Progress Notes (Signed)
Patient ID: Gary [name], male   DOB: 09-May-1945, 74 y.o.   MRN: [id number]  SW spoke with  RN/CM Meredeth Ide ([phone]/[phone])to provide updates from team conference, change in d/c date to 8/29. States aide services will be with [facility]; HHPT/OT with [facility]. SW discussed SLP. Will follow-up if unable to obtain, and pt may require OPT SLP. Reports all DME to be delivered before 9/1. [name] reports they were able to get HHSLP for pt.   SW left message for pt wife [name] [phone]) to provide updates on above. SW encouraged follow-up if needed.   Loralee Pacas, MSW, [place] Office: [phone] Cell: [phone] Fax: [phone]
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
## 2019-08-31 NOTE — Progress Notes (Signed)
Physical Therapy Session Note  Patient Details  Name: Gary [name] MRN: [id number] Date of Birth: 1945/11/17  Today's Date: 08/31/2019 PT Individual Time: 6812-7517 PT Individual Time Calculation (min): 60 min   Short Term Goals: Week 3:  PT Short Term Goal 1 (Week 3): Patient will perform basic transfers with supervision consitently using LRAD. PT Short Term Goal 2 (Week 3): Patient will ambulate >100 feet with supervision using LRAD. PT Short Term Goal 3 (Week 3): Patient will perform 16 steps with min A using L rail.  Skilled Therapeutic Interventions/Progress Updates:     Patient in TIS w/c with his daughter and wife present for family education upon PT arrival. Patient alert and agreeable to PT session. Patient denied pain during session. Patient denied dizziness today with mobility and his wife reports that he has not had Meclizine today. Withheld further vestibular testing today due to lack of symptoms. Will continue to monitor patient's symptoms throughout stay.   Discussed d/c planning, home mobility, and gradual progressive daily walking program for the patient; progressing from in-home, to level driveway, to walking at a nearby park. Educated patient and family on fall risk/prevention, home modifications to prevent falls, and activation of emergency services in the event of a fall during session.   Therapeutic Activity: Transfers: Patient performed sit to/from stand x2 with supervision for safety using a RW. Patient's daughter provided guarding within arms reach. Patient performed a floor transfer mat table to/from floor mat following PT demonstration with close supervision. Educated patient and family on performing self assessment in the event of a fall, signs/symptoms indicating that emergency services should be called and patient should not get up, and performing floor to sitting transfers only, rather than floor to standing for increased safety and reduced fall risk. Patient  able to teach back sequencing for transfer, performing self-check, and 3/6 critical signs to activation emergency services during transfer. Also, educated on patient keeping his cell phone in his pocket to have access to call someone in the event of an unwitnessed fall at home.   Gait Training:  Patient ambulated >100 feet x3 using RW with supervision in the room and CGA in the hallways, assist provided by his daughter using gait belt for safety. Ambulated with decreased gait speed, decreased step length and height, narrow BOS, and increased use of UE support. Provided verbal cues for shoulder depression and light touch on RW for steadying support only. Lowered RW for improved UE positioning.  Patient ascended/descended 11 steps in the stairwell x3 using L rail with B UE support with CGA. Trial 1 with PT demonstrating safe guarding techique, trial 2 with patient's wife demonstrating safe guarding technique, and trial 3 with patient's daughter demonstrating safe guarding technique. Patient did not require rest breaks between trials. Also demonstrated 2 person guarding technique for additional safety at home if assist is available. Demonstrated lowering patient to sitting position in the event of LOB on the stairs. Performed reciprocal gait pattern while ascending and step-to with intermittent reciprocal gait pattern while descending. Provided cues for technique and patient's hand placement on rails for safety.   Patient in [place] w/c with his family in the room at end of session with breaks locked and all needs within reach. Provided patient's wife with [employer] booklet and resources at end of session.    Therapy Documentation Precautions:  Precautions Precautions: Fall Weight Bearing Restrictions: No    Therapy/Group: Individual Therapy  Brookelin Felber L Alma Muegge PT, DPT  08/31/2019, 5:46 PM

## 2019-08-31 NOTE — Progress Notes (Signed)
Physical Therapy Session Note  Patient Details  Name: Gary [name] MRN: [id number] Date of Birth: 09/20/1945  Today's Date: 08/31/2019 PT Individual Time: 1625-1650 PT Individual Time Calculation (min): 25 min   Short Term Goals: Week 3:  PT Short Term Goal 1 (Week 3): Patient will perform basic transfers with supervision consitently using LRAD. PT Short Term Goal 2 (Week 3): Patient will ambulate >100 feet with supervision using LRAD. PT Short Term Goal 3 (Week 3): Patient will perform 16 steps with min A using L rail.  Skilled Therapeutic Interventions/Progress Updates:   Pt received supine in bed and agreeable to PT. Supine>sit transfer without assist or cues. Pt able to don shoes EOB with supervision assist for safety. Gait training through hall with supervision assist and cues for awareness of obstacles on the R and improved safety through doorways 2 x 174f.  Dynamic balance training while engaged in sustaion attention task of Wii bowling. Pt able attend to task with supervision assist for entire 10 frames, only requiring re-direction once through activity. Supervision assist from PT to maintain balance in semitandem stance for entire games with 1-2 UE support on RW. One near LOB noted but able to correct with UE support on RW. Pt then requesting to return to bed. Pt returned to room and performed ambulatory transfer to bed with RW and supervision assist.  Donned shoes while EOB with supervision assist for safety. Sit>supine completed without assistm and left supine in bed with call bell in reach and all needs met.        Therapy Documentation Precautions:  Precautions Precautions: Fall Precaution Comments: cortrak, BUE mittens on eval Required Braces or Orthoses: Other Brace Other Brace: posey belt in chair Restrictions Weight Bearing Restrictions: No Vital Signs: Therapy Vitals Temp: 97.8 F (36.6 C) Pulse Rate: 92 Resp: 16 BP: (!) 141/67 Patient Position (if  appropriate): Lying Oxygen Therapy SpO2: 100 % O2 Device: Room Air Pain: denies  Therapy/Group: Individual Therapy  ALorie Phenix8/25/2021, 5:00 PM

## 2019-08-31 NOTE — Progress Notes (Signed)
Occupational Therapy Session Note  Patient Details  Name: Gary [name] MRN: [id number] Date of Birth: Apr 18, 1945  Today's Date: 08/31/2019 OT Individual Time: 1031-5945 OT Individual Time Calculation (min): 45 min    Short Term Goals: Week 1:  OT Short Term Goal 1 (Week 1): Pt will initate threading 1LE into pants OT Short Term Goal 1 - Progress (Week 1): Met OT Short Term Goal 2 (Week 1): Pt will stand pivot transfer to toilet wiht MOD A of 1 to decrease caregiver burden OT Short Term Goal 2 - Progress (Week 1): Met OT Short Term Goal 3 (Week 1): Pt will bathe UB with mod VC for sequencing OT Short Term Goal 3 - Progress (Week 1): Met OT Short Term Goal 4 (Week 1): Pt will complete UB dressing with MOD A OT Short Term Goal 4 - Progress (Week 1): Met  Skilled Therapeutic Interventions/Progress Updates:    1;1. Pt, wife and daughter present for family education. Wife and daughter decline bathing and dressing session this date and wife to come in Saturday to do full ADL with OT supervision prior to discharge. Pt completes ambulation to/from room to tx gym with 2.5# ankle weight on LLE (on the way back at end of session) as external cue to not scissor LEs with ambulation with some improvement. Pt completes mobilty up/down curb, over uneven surface and up ramp with daughter and wife 2x with daughter requiring encouragement to speak up and cue pt as needed. Wife with no need of cues from OT. Daughter improved throughout session with cuing pt for RW management and proximity to RW. Exited session with pt seated in TIS, Exited alarm on and call light in reach  Therapy Documentation Precautions:  Precautions Precautions: Fall Precaution Comments: cortrak, BUE mittens on eval Required Braces or Orthoses: Other Brace Other Brace: posey belt in chair Restrictions Weight Bearing Restrictions: No General:   Vital Signs:   Pain: Pain Assessment Pain Scale: 0-10 Pain Score: 4  Pain Type:  Acute pain Pain Location: Face Pain Orientation: Right Pain Descriptors / Indicators: Other (Comment) ("feels strange") Pain Onset: On-going Patients Stated Pain Goal: 0 Pain Intervention(s): Refused ADL: ADL Upper Body Bathing: Maximal assistance Where Assessed-Upper Body Bathing: Sitting at sink Lower Body Bathing: Dependent Where Assessed-Lower Body Bathing: Standing at sink, Sitting at sink Upper Body Dressing: Dependent Where Assessed-Upper Body Dressing: Sitting at sink Lower Body Dressing: Dependent (+2 sit to stand) Where Assessed-Lower Body Dressing: Sitting at sink, Standing at sink Toileting: Dependent (+2) Where Assessed-Toileting: Glass blower/designer: Dependent (+2) Toilet Transfer Method: Arts development officer: Grab bars ADL Comments: +2 A for mobility for safety- able to sit to stand wiht MOD A, however motor planning deficits impacting safety during pivot and stand.sit transitions Vision   Perception    Praxis   Exercises:   Other Treatments:     Therapy/Group: Individual Therapy  [name] 08/31/2019, 9:43 AM

## 2019-08-31 NOTE — Plan of Care (Signed)
  Problem: Consults Goal: RH BRAIN INJURY PATIENT EDUCATION Description: Description: See Patient Education module for eduction specifics Outcome: Progressing Goal: Skin Care Protocol Initiated - if Braden Score 18 or less Description: If consults are not indicated, leave blank or document N/A Outcome: Progressing Goal: Nutrition Consult-if indicated Outcome: Progressing   Problem: RH BOWEL ELIMINATION Goal: RH STG MANAGE BOWEL WITH ASSISTANCE Description: STG Manage Bowel with min Assistance. Outcome: Progressing Goal: RH STG MANAGE BOWEL W/MEDICATION W/ASSISTANCE Description: STG Manage Bowel with Medication with mod Assistance. Outcome: Progressing   Problem: RH BLADDER ELIMINATION Goal: RH STG MANAGE BLADDER WITH ASSISTANCE Description: STG Manage Bladder With min Assistance Outcome: Progressing   Problem: RH SKIN INTEGRITY Goal: RH STG MAINTAIN SKIN INTEGRITY WITH ASSISTANCE Description: STG Maintain Skin Integrity With min Assistance. Outcome: Progressing   Problem: RH SAFETY Goal: RH STG ADHERE TO SAFETY PRECAUTIONS W/ASSISTANCE/DEVICE Description: STG Adhere to Safety Precautions With mod Assistance/Device. Outcome: Progressing Goal: RH STG DECREASED RISK OF FALL WITH ASSISTANCE Description: STG Decreased Risk of Fall With min Assistance. Outcome: Progressing   Problem: RH COGNITION-NURSING Goal: RH STG USES MEMORY AIDS/STRATEGIES W/ASSIST TO PROBLEM SOLVE Description: STG Uses Memory Aids/Strategies With min Assistance to Problem Solve. Outcome: Progressing Goal: RH STG ANTICIPATES NEEDS/CALLS FOR ASSIST W/ASSIST/CUES Description: STG Anticipates Needs/Calls for Assist With min Assistance/Cues. Outcome: Progressing   Problem: RH PAIN MANAGEMENT Goal: RH STG PAIN MANAGED AT OR BELOW [name] PAIN GOAL Description: Pt pain will be managed at stated goal of 2 Outcome: Progressing   Problem: RH KNOWLEDGE DEFICIT BRAIN INJURY Goal: RH STG INCREASE KNOWLEDGE OF SELF  CARE AFTER BRAIN INJURY Outcome: Progressing Goal: RH STG INCREASE KNOWLEDGE OF DYSPHAGIA/FLUID INTAKE Outcome: Progressing

## 2019-08-31 NOTE — Progress Notes (Signed)
Speech Language Pathology Daily Session Note  Patient Details  Name: Gary [name] MRN: [id number] Date of Birth: 08-30-1945  Today's Date: 08/31/2019 SLP Individual Time: 1315-1345 SLP Individual Time Calculation (min): 30 min  Short Term Goals: Week 3: SLP Short Term Goal 1 (Week 3): STGs=LTGS due to ELOS  Skilled Therapeutic Interventions: Skilled treatment session focused on cognitive goals. SLP facilitated session by providing overall supervision level verbal cues for functional problem solving and organization during a mildly complex calendar task. Patient requested to use the bathroom and ambulated with the RW with contact guard due. Patient left supine in bed with alarm on and all needs within reach. Continue with current plan of care.      Pain No/Denies Pain  Therapy/Group: Individual Therapy  Nazia Rhines 08/31/2019, 1:47 PM

## 2019-09-01 ENCOUNTER — Inpatient Hospital Stay (HOSPITAL_COMMUNITY): Payer: Medicare Other

## 2019-09-01 ENCOUNTER — Inpatient Hospital Stay (HOSPITAL_COMMUNITY): Payer: Medicare Other | Admitting: Speech Pathology

## 2019-09-01 MED ORDER — AMPHETAMINE-DEXTROAMPHET ER 10 MG PO CP24
40.0000 mg | ORAL_CAPSULE | Freq: Every day | ORAL | Status: DC
  Administered 2019-09-02 – 2019-09-04 (×3): 40 mg via ORAL
  Filled 2019-09-01 (×3): qty 8

## 2019-09-01 NOTE — Progress Notes (Signed)
Patient ID: Adyen Bifulco, male   DOB: August 14, 1945, 74 y.o.   MRN: [id number]   SW received updates from RN/CM Meredeth Ide (p:[phone]/f:[phone]) in regards to challenges with obtaining HH as [facility] is now unable to provide services. Reports waiting on updates from [facility] on if they can accept for all services including aide. States waiting to hear from [facility] services to see if they are capable of staffing aide service since they have reported staffing challenges. SW to assist with contacting agencies.  *SW unable to find H[facility] able to accept for services.   Declined Agencies: [facility]- accepts [facility] but not service area [facility]- willing to consider [place], but not in service area [facility]-does accept [place], MSW, [place] Office: [phone] Cell: [phone] Fax: [phone]
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
## 2019-09-01 NOTE — Progress Notes (Signed)
Speech Language Pathology Daily Session Note  Patient Details  Name: Gary [name] MRN: [id number] Date of Birth: November 20, 1945  Today's Date: 09/01/2019 SLP Individual Time: 1030-1127 SLP Individual Time Calculation (min): 57 min  Short Term Goals: Week 3: SLP Short Term Goal 1 (Week 3): STGs=LTGS due to ELOS  Skilled Therapeutic Interventions: Pt was seen for skilled ST targeting cognitive goals. SLP facilitated session with a mildly complex medication management task. Pt with excellent recall of familiar medications and regimen, however cues required for mental flexibility and rationalizing new regimen of current medications. Pt used list of current medications to organize a BID pill box with 1 verbal cue to correct and error (placed AM pills in PM slots). Also discussed associating medication administration of pt's 1 prescribed BID medication with meal times (breakfast and lunch) as a compensatory memory strategy, if not using a BID pill box. Pt stated awareness that his cognitive functioning has imprved but is not "where it should be." In functional conversation, provided Min A verbal cues, pt anticipated 2 differences/new changes he would have to make at home in regards to his current physical and cognitive abilities s/p acute changes with TBI. Pt left sitting in chair with alarm set and needs within reach. Continue per current plan of care.        Pain Pain Assessment Pain Scale: Faces Faces Pain Scale: No hurt  Therapy/Group: Individual Therapy  Arbutus Leas 09/01/2019, 12:26 PM

## 2019-09-01 NOTE — Progress Notes (Addendum)
Physical Therapy Session Note  Patient Details  Name: Lamarr Feenstra MRN: [id number] Date of Birth: 08-23-1945  Today's Date: 09/01/2019 PT Individual Time: 0905-1003 PT Individual Time Calculation (min): 58 min   Short Term Goals: Week 3:  PT Short Term Goal 1 (Week 3): Patient will perform basic transfers with supervision consitently using LRAD. PT Short Term Goal 2 (Week 3): Patient will ambulate >100 feet with supervision using LRAD. PT Short Term Goal 3 (Week 3): Patient will perform 16 steps with min A using L rail.  Skilled Therapeutic Interventions/Progress Updates:     Patient in bed upon PT arrival. Patient alert and agreeable to PT session. Patient denied pain during session. Continues to report double vision impairing mobility throughout session.  Therapeutic Activity: Bed Mobility: Patient performed supine to sit with supervision for safety. Transfers: Patient performed sit to/from stand without AD with supervision throughout session. Provided verbal cues for increased forward weight shift x2. Five times Sit to Stand Test (FTSS) Method: Use a straight back chair with a solid seat that is 16-18" high. Ask participant to sit on the chair with arms folded across their chest.   Instructions: "Stand up and sit down as quickly as possible 5 times, keeping your arms folded across your chest."   Measurement: Stop timing when the participant stands the 5th time.  TIME: __16.3____ (in seconds)  Times > 13.6 seconds is associated with increased disability and morbidity (Guralnik, 2000) Times > 15 seconds is predictive of recurrent falls in healthy individuals aged 94 and older (Buatois, et al., 2008) Normal performance values in community dwelling individuals aged 53 and older (Bohannon, 2006): o 60-69 years: 11.4 seconds o 70-79 years: 12.6 seconds o 80-89 years: 14.8 seconds  MCID: ? 2.3 seconds for Vestibular Disorders Mariah Milling, 2006)  Gait Training:  Patient ambulated  throughout the unit without an AD with CGA-close supervision for safety. Ambulated with narrow BOS, decreased gait speed, decreased step height, and decreased arm swing and trunk rotation, all improved following cues x1.  6 Min Walk Test:  Instructed patient to ambulate as quickling and as safely as possible for 6 minutes using LRAD. Patient was allowed to take standing rest breaks without stopping the test, but if he required a sitting rest break the clock would be stopped and the test would be over.  Results: 1660 feet without an AD with CGA-close supervision for safety. RPE 2-3/10 at end of test.   Neuromuscular Re-ed: Patient performed the [facility] Test and Functional Gait Assessment, see details below: Patient demonstrates increased fall risk as noted by score of 45/56 on Berg Balance Scale.  (<36= high risk for falls, close to 100%; 37-45 significant >80%; 46-51 moderate >50%; 52-55 lower >25%) Patient demonstrates increased fall risk with dynamic gait as noted by score of 14/30 on Functional Gait Assessment. (<19 = high fall risk)  Educated patient on results and interpretation from tests performed during session. Discussed patient's progress since admission with mobility and cognition demonstrated during testing today. Patient appreciative of education and surprised by his performance at admission.   Patient in [place] w/c at end of session with breaks locked, seat belt alarm set, and all needs within reach.    Therapy Documentation Precautions:  Precautions Precautions: Fall Precaution Comments: cortrak, BUE mittens on eval Required Braces or Orthoses: Other Brace Other Brace: posey belt in chair Restrictions Weight Bearing Restrictions: No Balance: Standardized Balance Assessment Standardized Balance Assessment: Berg Balance Test;Functional Gait Assessment Berg Balance Test Sit to Stand:  Able to stand without using hands and stabilize independently Standing Unsupported: Able to  stand safely 2 minutes Sitting with Back Unsupported but Feet Supported on Floor or Stool: Able to sit safely and securely 2 minutes Stand to Sit: Sits safely with minimal use of hands Transfers: Able to transfer safely, minor use of hands Standing Unsupported with Eyes Closed: Able to stand 10 seconds with supervision Standing Ubsupported with Feet Together: Able to place feet together independently and stand for 1 minute with supervision From Standing, Reach Forward with Outstretched Arm: Can reach forward >5 cm safely (2") From Standing Position, Pick up Object from Floor: Able to pick up shoe, needs supervision From Standing Position, Turn to Look Behind Over each Shoulder: Looks behind from both sides and weight shifts well Turn 360 Degrees: Needs close supervision or verbal cueing Standing Unsupported, Alternately Place Feet on Step/Stool: Able to stand independently and complete 8 steps >20 seconds Standing Unsupported, One Foot in Front: Able to plae foot ahead of the other independently and hold 30 seconds Standing on One Leg: Able to lift leg independently and hold 5-10 seconds Total Score: 45/56 Functional Gait  Assessment Gait Level Surface: Walks 20 ft, slow speed, abnormal gait pattern, evidence for imbalance or deviates 10-15 in outside of the 12 in walkway width. Requires more than 7 sec to ambulate 20 ft. Change in Gait Speed: Makes only minor adjustments to walking speed, or accomplishes a change in speed with significant gait deviations, deviates 10-15 in outside the 12 in walkway width, or changes speed but loses balance but is able to recover and continue walking. Gait with Horizontal Head Turns: Performs head turns with moderate changes in gait velocity, slows down, deviates 10-15 in outside 12 in walkway width but recovers, can continue to walk. Gait with Vertical Head Turns: Performs task with moderate change in gait velocity, slows down, deviates 10-15 in outside 12 in  walkway width but recovers, can continue to walk. Gait and Pivot Turn: Pivot turns safely in greater than 3 sec and stops with no loss of balance, or pivot turns safely within 3 sec and stops with mild imbalance, requires small steps to catch balance. Step Over Obstacle: Is able to step over one shoe box (4.5 in total height) without changing gait speed. No evidence of imbalance. Gait with Narrow Base of Support: Ambulates less than 4 steps heel to toe or cannot perform without assistance. Gait with Eyes Closed: Walks 20 ft, uses assistive device, slower speed, mild gait deviations, deviates 6-10 in outside 12 in walkway width. Ambulates 20 ft in less than 9 sec but greater than 7 sec. Ambulating Backwards: Walks 20 ft, uses assistive device, slower speed, mild gait deviations, deviates 6-10 in outside 12 in walkway width. Steps: Alternating feet, must use rail. Total Score: 14/30   Therapy/Group: Individual Therapy  Marsena Taff L Marysa Wessner PT, DPT  09/01/2019, 12:56 PM

## 2019-09-01 NOTE — Progress Notes (Signed)
Physical Therapy Discharge Summary  Patient Details  Name: Gary [name] MRN: [id number] Date of Birth: 1945/12/19  Today's Date: 09/03/2019 PT Individual Time:  -       Patient has met 13 of 13 long term goals due to improved activity tolerance, improved balance, improved postural control, increased strength, decreased pain, ability to compensate for deficits, improved attention, improved awareness and improved coordination.  Patient to discharge at an ambulatory level Supervision.   Patient's care partner is independent to provide the necessary physical and cognitive assistance at discharge.  Reasons goals not met: All PT goals met   Recommendation:  Patient will benefit from ongoing skilled PT services in home health setting to continue to advance safe functional mobility, address ongoing impairments in balance, functional mobility, vision/perception, safety awareness, gait and stair training, community reintegration, attention, cognition, and minimize fall risk.  Equipment: RW, 16"x16" light weight w/c for community access for safety, hospital bed for for patient to sleep downstairs for safety  Reasons for discharge: treatment goals met  Patient/family agrees with progress made and goals achieved: Yes  PT Discharge Precautions/Restrictions Precautions Precautions: Fall Restrictions Weight Bearing Restrictions: No Vital Signs Therapy Vitals Pulse Rate: 82 Resp: 18 BP: (!) 147/77 Patient Position (if appropriate): Lying Oxygen Therapy SpO2: 98 % O2 Device: Room Air Vision/Perception    mild diplopia. Able to correct with 1 eye closed   Sensation Sensation Light Touch: Appears Intact Proprioception: Impaired Detail Proprioception Impaired Details: Impaired RUE;Impaired RLE Coordination Gross Motor Movements are Fluid and Coordinated: No Fine Motor Movements are Fluid and Coordinated: No Coordination and Movement Description: Visual and proprioceptive deficits  impact all mobility, decreased postural control impacting standing balance and functional transfers/gait Motor  Motor Motor: Abnormal postural alignment and control Motor - Discharge Observations: Continues to present with impaired postural control and generalized deconditioning, much improved since admission  Mobility Bed Mobility Bed Mobility: Rolling Right;Rolling Left;Supine to Sit;Sit to Supine Rolling Right: Independent Rolling Left: Independent Supine to Sit: Independent Sit to Supine: Independent Transfers Transfers: Sit to Stand;Stand to Sit;Stand Pivot Transfers Sit to Stand: Supervision/Verbal cueing Stand to Sit: Supervision/Verbal cueing Stand Pivot Transfers: Supervision/Verbal cueing Transfer (Assistive device): None Locomotion  Gait Ambulation: Yes Gait Assistance: Contact Guard/Touching assist;Supervision/Verbal cueing Gait Distance (Feet): 1065 Feet Assistive device: None Gait Gait: Yes Gait Pattern: Step-through pattern;Decreased hip/knee flexion - right;Decreased hip/knee flexion - left;Decreased trunk rotation;Trunk flexed;Narrow base of support Gait velocity: decreased Stairs / Additional Locomotion Stairs: Yes Stairs Assistance: Contact Guard/Touching assist Stair Management Technique: One rail Left Number of Stairs: 33 Height of Stairs: 6 Ramp: Contact Guard/touching assist (with RW) Curb: Contact Guard/Touching assist (with RW)  Trunk/Postural Assessment  Cervical Assessment Cervical Assessment: Exceptions to [facility] (forward head and limited ROM in all directions due to arthritic changes at baseline) Thoracic Assessment Thoracic Assessment: Exceptions to [facility] (mild kyphosis) Lumbar Assessment Lumbar Assessment: Exceptions to [facility] (posterior pelvic tilt) Postural Control Postural Control: Deficits on evaluation (decreased/delayed, improved since admission)  Balance Static Sitting Balance Static Sitting - Balance Support: No upper extremity  supported;Feet supported Static Sitting - Level of Assistance: 7: Independent Dynamic Sitting Balance Dynamic Sitting - Balance Support: No upper extremity supported;Feet supported Dynamic Sitting - Level of Assistance: 5: Stand by assistance Static Standing Balance Static Standing - Balance Support: No upper extremity supported;During functional activity Static Standing - Level of Assistance: 5: Stand by assistance Dynamic Standing Balance Dynamic Standing - Balance Support: During functional activity;No upper extremity supported Dynamic Standing - Level of Assistance:  4: Min assist;5: Stand by assistance;Other (comment) Dynamic Standing - Balance Activities: Lateral lean/weight shifting Extremity Assessment          Outcome Measures: Berg Balance Test Sit to Stand: Able to stand without using hands and stabilize independently Standing Unsupported: Able to stand safely 2 minutes Sitting with Back Unsupported but Feet Supported on Floor or Stool: Able to sit safely and securely 2 minutes Stand to Sit: Sits safely with minimal use of hands Transfers: Able to transfer safely, minor use of hands Standing Unsupported with Eyes Closed: Able to stand 10 seconds with supervision Standing Ubsupported with Feet Together: Able to place feet together independently and stand for 1 minute with supervision From Standing, Reach Forward with Outstretched Arm: Can reach forward >5 cm safely (2") From Standing Position, Pick up Object from Floor: Able to pick up shoe, needs supervision From Standing Position, Turn to Look Behind Over each Shoulder: Looks behind from both sides and weight shifts well Turn 360 Degrees: Needs close supervision or verbal cueing Standing Unsupported, Alternately Place Feet on Step/Stool: Able to stand independently and complete 8 steps >20 seconds Standing Unsupported, One Foot in Front: Able to plae foot ahead of the other independently and hold 30 seconds Standing on One  Leg: Able to lift leg independently and hold 5-10 seconds Total Score: 45/56 (37-45 significant fall risk >80%) Functional Gait  Assessment Gait Level Surface: Walks 20 ft, slow speed, abnormal gait pattern, evidence for imbalance or deviates 10-15 in outside of the 12 in walkway width. Requires more than 7 sec to ambulate 20 ft. Change in Gait Speed: Makes only minor adjustments to walking speed, or accomplishes a change in speed with significant gait deviations, deviates 10-15 in outside the 12 in walkway width, or changes speed but loses balance but is able to recover and continue walking. Gait with Horizontal Head Turns: Performs head turns with moderate changes in gait velocity, slows down, deviates 10-15 in outside 12 in walkway width but recovers, can continue to walk. Gait with Vertical Head Turns: Performs task with moderate change in gait velocity, slows down, deviates 10-15 in outside 12 in walkway width but recovers, can continue to walk. Gait and Pivot Turn: Pivot turns safely in greater than 3 sec and stops with no loss of balance, or pivot turns safely within 3 sec and stops with mild imbalance, requires small steps to catch balance. Step Over Obstacle: Is able to step over one shoe box (4.5 in total height) without changing gait speed. No evidence of imbalance. Gait with Narrow Base of Support: Ambulates less than 4 steps heel to toe or cannot perform without assistance. Gait with Eyes Closed: Walks 20 ft, uses assistive device, slower speed, mild gait deviations, deviates 6-10 in outside 12 in walkway width. Ambulates 20 ft in less than 9 sec but greater than 7 sec. Ambulating Backwards: Walks 20 ft, uses assistive device, slower speed, mild gait deviations, deviates 6-10 in outside 12 in walkway width. Steps: Alternating feet, must use rail. Total Score: 14/30 (<19=increased fall risk) Five times Sit to Stand Test (FTSS) Method: Use a straight back chair with a solid seat that is  16-18" high. Ask participant to sit on the chair with arms folded across their chest.   Instructions: "Stand up and sit down as quickly as possible 5 times, keeping your arms folded across your chest."   Measurement: Stop timing when the participant stands the 5th time.  TIME: __16.3____ (in seconds)  Times > 13.6  seconds is associated with increased disability and morbidity (Guralnik, 2000) Times > 15 seconds is predictive of recurrent falls in healthy individuals aged 72 and older (Buatois, et al., 2008) Normal performance values in community dwelling individuals aged 72 and older (Bohannon, 2006): ? 60-69 years: 11.4 seconds ? 70-79 years: 12.6 seconds ? 80-89 years: 14.8 seconds  MCID: ? 2.3 seconds for Vestibular Disorders (Meretta, 2006) 6 Min Walk Test: Instructed patient to ambulate as quickling and as safely as possible for 6 minutes using LRAD. Patient was allowed to take standing rest breaks without stopping the test, but if he required a sitting rest break the clock would be stopped and the test would be over.  Results: 4536 feet without an AD with CGA-close supervision for safety. RPE 2-3/10 at end of test.    Doreene Burke PT, DPT  09/01/2019, 8:43 PM

## 2019-09-02 ENCOUNTER — Inpatient Hospital Stay (HOSPITAL_COMMUNITY): Payer: Medicare Other

## 2019-09-02 ENCOUNTER — Inpatient Hospital Stay (HOSPITAL_COMMUNITY): Payer: Medicare Other | Admitting: Speech Pathology

## 2019-09-02 DIAGNOSIS — Z7189 Other specified counseling: Secondary | ICD-10-CM

## 2019-09-02 LAB — CBC
HCT: 34.4 % — ABNORMAL LOW (ref 39.0–52.0)
Hemoglobin: 11.8 g/dL — ABNORMAL LOW (ref 13.0–17.0)
MCH: 32.2 pg (ref 26.0–34.0)
MCHC: 34.3 g/dL (ref 30.0–36.0)
MCV: 93.7 fL (ref 80.0–100.0)
Platelets: 293 10*3/uL (ref 150–400)
RBC: 3.67 MIL/uL — ABNORMAL LOW (ref 4.22–5.81)
RDW: 14 % (ref 11.5–15.5)
WBC: 4.4 10*3/uL (ref 4.0–10.5)
nRBC: 0 % (ref 0.0–0.2)

## 2019-09-02 LAB — BASIC METABOLIC PANEL
Anion gap: 10 (ref 5–15)
BUN: 19 mg/dL (ref 8–23)
CO2: 24 mmol/L (ref 22–32)
Calcium: 9.5 mg/dL (ref 8.9–10.3)
Chloride: 109 mmol/L (ref 98–111)
Creatinine, Ser: 1.65 mg/dL — ABNORMAL HIGH (ref 0.61–1.24)
GFR calc Af Amer: 48 mL/min — ABNORMAL LOW (ref >60)
GFR calc non Af Amer: 41 mL/min — ABNORMAL LOW (ref >60)
Glucose, Bld: 92 mg/dL (ref 70–99)
Potassium: 4 mmol/L (ref 3.5–5.1)
Sodium: 143 mmol/L (ref 135–145)

## 2019-09-02 MED ORDER — PANTOPRAZOLE SODIUM 40 MG PO TBEC: 40.0000 mg | DELAYED_RELEASE_TABLET | Freq: Every day | ORAL | 0 refills | Status: DC

## 2019-09-02 MED ORDER — AMPHETAMINE-DEXTROAMPHET ER 20 MG PO CP24: 40.0000 mg | ORAL_CAPSULE | Freq: Every day | ORAL | 0 refills | Status: DC

## 2019-09-02 MED ORDER — MELATONIN 3 MG PO TABS: 3.0000 mg | ORAL_TABLET | Freq: Every day | ORAL | 0 refills | Status: DC

## 2019-09-02 MED ORDER — CALCIUM POLYCARBOPHIL 625 MG PO TABS: 625.0000 mg | ORAL_TABLET | Freq: Every day | ORAL | 0 refills | Status: DC

## 2019-09-02 NOTE — Discharge Instructions (Signed)
Inpatient Rehab Discharge Instructions  [facility] Discharge date and time:  09/04/19  Activities/Precautions/ Functional Status: Activity: no lifting, driving, or strenuous exercise till cleared by MD Diet: regular diet Wound Care: keep wound clean and dry    Functional status:  ___ No restrictions     ___ Walk up steps independently _X__ 24/7 supervision/assistance   ___ Walk up steps with assistance ___ Intermittent supervision/assistance  ___ Bathe/dress independently ___ Walk with walker     _X__ Bathe/dress with assistance ___ Walk Independently    ___ Shower independently ___ Walk with assistance    ___ Shower with assistance _X__ No alcohol     ___ Return to work/school ________    COMMUNITY REFERRALS UPON DISCHARGE:    Home Health:   PT     OT     ST  SNA                  Agency: being arranged by worker's comp  Medical Equipment/Items Ordered:Being arranged by worker's comp  Special Instructions:    My questions have been answered and I understand these instructions. I will adhere to these goals and the provided educational materials after my discharge from the hospital.  Patient/Caregiver Signature _______________________________ Date __________  Clinician Signature _______________________________________ Date __________  Please bring this form and your medication list with you to all your follow-up doctor's appointments.

## 2019-09-02 NOTE — Progress Notes (Signed)
Patient ID: Gary [name], male   DOB: [birth year], 74 y.o.   MRN: [id number]  SW received updates from [place] [facility]/WC [place] [phone]) reporting pt wife is adamant about pt d/c to home on Sunday despite recently being informed of pt not having HHPT/OT/SLP as [facility] unable to accept as they do not come to this area. Pt DME: shower chair, RW have arrived. Items to be delivered between today and Monday include: hospital bed, w/c (Saturday) and . WC CM has stated she is hopeful the hospital bed and [facility] can be delivered today. Pt will not have full 24/7 care as [facility] services is still working on providing care for all 3 shifts. Only a few days for shifts 11am-7pm (primary shift) have been filled. SW has been informed that wife is aware and does not want to change date. SW informed that hte wife intends to purchase a recliner chair until the hospital bed arrives.SW updated medical team.   Loralee Pacas, MSW, [place] Office: [phone] Cell: [phone] Fax: [phone]

## 2019-09-02 NOTE — Progress Notes (Signed)
Occupational Therapy Session Note  Patient Details  Name: Gary [name] MRN: [id number] Date of Birth: 1945-02-06  Today's Date: 09/02/2019 OT Individual Time: 2233-6122 OT Individual Time Calculation (min): 41 min    Short Term Goals: Week 1:  OT Short Term Goal 1 (Week 1): Pt will initate threading 1LE into pants OT Short Term Goal 1 - Progress (Week 1): Met OT Short Term Goal 2 (Week 1): Pt will stand pivot transfer to toilet wiht MOD A of 1 to decrease caregiver burden OT Short Term Goal 2 - Progress (Week 1): Met OT Short Term Goal 3 (Week 1): Pt will bathe UB with mod VC for sequencing OT Short Term Goal 3 - Progress (Week 1): Met OT Short Term Goal 4 (Week 1): Pt will complete UB dressing with MOD A OT Short Term Goal 4 - Progress (Week 1): Met  Skilled Therapeutic Interventions/Progress Updates:    1;1. Pt received EOB with PT exiting. Pt completes functional mobility to/from all tx destinations with S-CGA for lateral sway when walking with no AD. Pt completes dynavision without occluded glasses (pt closes eye voluntarily when misses target) 2.7 avg reaction time, with glasses L eye occluded 1.7 seconds avg, and instructions RUE v LUE to select specific red/green lights for dual cognitive tasks 2.0 (RUE) and 2.6 (LUE). Pt competes seated line bisection without occlusion glasses with 10 extra marks d/t diplopia. With paper placed in far L lower visual field pt able to complete line bisection with 2 extra marks, and with occluded glasses on no extra marks. Pt demo poor visual scanning pattern. Pt provided with visual and mod VC for scanning pattern while completing letter cancellation test with occluded glasses and pt completes with no errors. Exited session with pt seated in w/c, exit alarm on and call light in reach  Therapy Documentation Precautions:  Precautions Precautions: Fall Precaution Comments: cortrak, BUE mittens on eval Required Braces or Orthoses: Other Brace Other  Brace: posey belt in chair Restrictions Weight Bearing Restrictions: No General:   Vital Signs: Therapy Vitals Temp: 97.9 F (36.6 C) Pulse Rate: 80 Resp: 18 BP: (!) 146/69 Patient Position (if appropriate): Lying Oxygen Therapy SpO2: 98 % O2 Device: Room Air Pain:   ADL: ADL Upper Body Bathing: Maximal assistance Where Assessed-Upper Body Bathing: Sitting at sink Lower Body Bathing: Dependent Where Assessed-Lower Body Bathing: Standing at sink, Sitting at sink Upper Body Dressing: Dependent Where Assessed-Upper Body Dressing: Sitting at sink Lower Body Dressing: Dependent (+2 sit to stand) Where Assessed-Lower Body Dressing: Sitting at sink, Standing at sink Toileting: Dependent (+2) Where Assessed-Toileting: Glass blower/designer: Dependent (+2) Toilet Transfer Method: Arts development officer: Grab bars ADL Comments: +2 A for mobility for safety- able to sit to stand wiht MOD A, however motor planning deficits impacting safety during pivot and stand.sit transitions Vision   Perception    Praxis   Exercises:   Other Treatments:     Therapy/Group: Individual Therapy  [name] 09/02/2019, 7:24 AM
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
## 2019-09-02 NOTE — Progress Notes (Signed)
Occupational Therapy Session Note  Patient Details  Name: Gary [name] MRN: [id number] Date of Birth: 1945/08/27  Today's Date: 09/02/2019 OT Individual Time: 1535-1600 OT Individual Time Calculation (min): 25 min    Short Term Goals: Week 1:  OT Short Term Goal 1 (Week 1): Pt will initate threading 1LE into pants OT Short Term Goal 1 - Progress (Week 1): Met OT Short Term Goal 2 (Week 1): Pt will stand pivot transfer to toilet wiht MOD A of 1 to decrease caregiver burden OT Short Term Goal 2 - Progress (Week 1): Met OT Short Term Goal 3 (Week 1): Pt will bathe UB with mod VC for sequencing OT Short Term Goal 3 - Progress (Week 1): Met OT Short Term Goal 4 (Week 1): Pt will complete UB dressing with MOD A OT Short Term Goal 4 - Progress (Week 1): Met  Skilled Therapeutic Interventions/Progress Updates:    1:1. Pt received in bed agreeable to OT. Pt completes ambulation with no AD and CGA-CS throughout unit and room. Pt completes standing toileting void using wall for steadying. Pt completes biodex weight shifting and balance activity limits of stability, maze and catch game with CGA for facilitation of hips for weight shift and VC for head leading movement/knee extension during activity. Exited session with pt sated in bed, exit alarm on and call light in reach  Therapy Documentation Precautions:  Precautions Precautions: Fall Precaution Comments: cortrak, BUE mittens on eval Required Braces or Orthoses: Other Brace Other Brace: posey belt in chair Restrictions Weight Bearing Restrictions: No General:   Vital Signs: Therapy Vitals Temp: 97.8 F (36.6 C) Pulse Rate: 84 Resp: 17 BP: 133/64 Patient Position (if appropriate): Lying Oxygen Therapy SpO2: 100 % O2 Device: Room Air Pain:   ADL: ADL Upper Body Bathing: Maximal assistance Where Assessed-Upper Body Bathing: Sitting at sink Lower Body Bathing: Dependent Where Assessed-Lower Body Bathing: Standing at sink,  Sitting at sink Upper Body Dressing: Dependent Where Assessed-Upper Body Dressing: Sitting at sink Lower Body Dressing: Dependent (+2 sit to stand) Where Assessed-Lower Body Dressing: Sitting at sink, Standing at sink Toileting: Dependent (+2) Where Assessed-Toileting: Glass blower/designer: Dependent (+2) Toilet Transfer Method: Arts development officer: Grab bars ADL Comments: +2 A for mobility for safety- able to sit to stand wiht MOD A, however motor planning deficits impacting safety during pivot and stand.sit transitions Vision   Perception    Praxis   Exercises:   Other Treatments:     Therapy/Group: Individual Therapy  [name] 09/02/2019, 4:13 PM

## 2019-09-02 NOTE — Progress Notes (Signed)
Speech Language Pathology Discharge Summary  Patient Details  Name: Gary [name] MRN: [id number] Date of Birth: [birth year]  Today's Date: 09/03/2019 SLP Individual Time: 1300-1330 SLP Individual Time Calculation (min): 30 min   Skilled Therapeutic Interventions: Skilled ST services focused on education and cognitive skills. Pt demonstrated recall of word association to assist with short term recall. SLP facilitated short term recall skills during scavenger hunt navigating the unit via RW utilizing initial written aid then repetition strategy to recall 5 items mod I. SLP educated on continued use of memory strategies, improvements in cognition and continued cognitive deficits. All questions answered to satisfaction. Pt was left with call bell within reach and bed alarm set.  Patient has met  (11) of  (11) long term goals.  Patient to discharge at [facility] level.   Reasons goals not met: N/A   Clinical Impression/Discharge Summary: Patient has made excellent gains and has met 11 of 11 LTGs this admission. Currently, patient demonstrates behaviors consistent with a Rancho Level VIII. Currently, patient is consuming regular textures with thin liquids without overt s/s of aspiration and overall Mod I for use of swallowing compensatory strategies. Patient is also 100% intelligible and can express complex wants/needs independently. Patient has made significant cognitive gains and requires overall supervision-Min A verbal cues to complete functional and mildly complex tasks safely in regards to problem solving, attention, recall and awareness. Patient and family education is complete and patient will discharge home with 24 hour supervision from family. Patient would benefit from f/u SLP services to maximize his cognitive functioning and overall functional independence in order to reduce caregiver burden.   Care Partner:  Caregiver Able to Provide Assistance: Yes  Type of Caregiver Assistance:  Physical;Cognitive  Recommendation:  24 hour supervision/assistance;Home Health SLP  Rationale for SLP Follow Up: Maximize cognitive function and independence;Reduce caregiver burden   Equipment: N/A   Reasons for discharge: Discharged from hospital;Treatment goals met   Patient/Family Agrees with Progress Made and Goals Achieved: Yes    [name] 09/03/2019, 3:32 PM
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
## 2019-09-02 NOTE — Progress Notes (Signed)
Speech Language Pathology Daily Session Note  Patient Details  Name: [name] MRN: [id number] Date of Birth: 1945-01-09  Today's Date: 09/02/2019 SLP Individual Time: 1300-1355 SLP Individual Time Calculation (min): 55 min  Short Term Goals: Week 3: SLP Short Term Goal 1 (Week 3): STGs=LTGS due to ELOS  Skilled Therapeutic Interventions: Skilled treatment session focused on cognitive goals. SLP facilitated session by administering the Conistat. Patient scored WFL on all subtests with the exception of mild impairments with the visual construction tasks. SLP also facilitated session with a functional conversation that focused on generating a verbal list of tasks the patient can perform safely at home to maximize cognitive functioning and overall functional independence as well as occupy his time. Patient transferred back to bed at end of session and left with alarm on and all needs within reach. Continue with current plan of care.      Pain No/Denies Pain   Therapy/Group: Individual Therapy  Taeja Debellis 09/02/2019, 3:04 PM

## 2019-09-02 NOTE — Progress Notes (Signed)
Physical Therapy Session Note  Patient Details  Name: Gary [name] MRN: [id number] Date of Birth: 1945/10/27  Today's Date: 09/02/2019 PT Individual Time: 1000-1100 PT Individual Time Calculation (min): 60 min   Short Term Goals: Week 1:  PT Short Term Goal 1 (Week 1): Pt will complete bed<>w/c with max assist +1. PT Short Term Goal 1 - Progress (Week 1): Met PT Short Term Goal 2 (Week 1): Pt will ambulate 20 ft with LRAD & +2 assist. PT Short Term Goal 2 - Progress (Week 1): Met PT Short Term Goal 3 (Week 1): Pt will complete bed mobility with max assist +1. PT Short Term Goal 3 - Progress (Week 1): Met Week 2:  PT Short Term Goal 1 (Week 2): Patient will perform basic transfers with CGA consistently using LRAD. PT Short Term Goal 1 - Progress (Week 2): Met PT Short Term Goal 2 (Week 2): Patient will ambulate >100 feet with CGA using LRAD. PT Short Term Goal 2 - Progress (Week 2): Met PT Short Term Goal 3 (Week 2): Patient will attend to a task in standing >5 min with CGA. PT Short Term Goal 3 - Progress (Week 2): Met Week 3:  PT Short Term Goal 1 (Week 3): Patient will perform basic transfers with supervision consitently using LRAD. PT Short Term Goal 2 (Week 3): Patient will ambulate >100 feet with supervision using LRAD. PT Short Term Goal 3 (Week 3): Patient will perform 16 steps with min A using L rail.  Skilled Therapeutic Interventions/Progress Updates:    PAIN denies pain this am  Pt initially seated at edge of bed.  STS w/close supervision, gait > 342f w/supervision, occasional mild balance loss w/self recovery, primarily w/turning/distractions.  Pt provided w/HEP including the following: Single limb stance in corner, challenging for pt Standing lateral lunge/alternating, cga w/this Standing gaze stabilization w/vertical and horizontal head nods/turns - challenging due to stability challenge as well as distractions. Gait w/head turns - performs w/cga  Performed all  exercises and reviewed written instructions. Pt provided w/contact information for neuro-optometry  Pt performed 122malk test w/results as follows: Fast Pace: 1.6992mc Comfortable pace : 1.104M/sec  Recorded on pt record/provided w/pt and instructed to take to OPT for continued care.  Pt requested therapist explain scores/purpose and therapist reviewed each score/relationship to fall risk/comp to norms.  Pt verbaized understanding, may need continued ed due to cognitive/memory impairments.  At end of session, pt returned to room and handed off to OT for next session.  Therapy Documentation Precautions:  Precautions Precautions: Fall Precaution Comments: cortrak, BUE mittens on eval Required Braces or Orthoses: Other Brace Other Brace: posey belt in chair Restrictions Weight Bearing Restrictions: No    Therapy/Group: Individual Therapy  BarCallie FieldingT [place]27/2021, 1:00 PM

## 2019-09-03 ENCOUNTER — Inpatient Hospital Stay (HOSPITAL_COMMUNITY): Payer: Medicare Other

## 2019-09-03 ENCOUNTER — Inpatient Hospital Stay (HOSPITAL_COMMUNITY): Payer: Medicare Other | Admitting: Physical Therapy

## 2019-09-03 ENCOUNTER — Encounter (HOSPITAL_COMMUNITY): Payer: Medicare Other | Admitting: Occupational Therapy

## 2019-09-03 LAB — BASIC METABOLIC PANEL
Anion gap: 12 (ref 5–15)
BUN: 22 mg/dL (ref 8–23)
CO2: 23 mmol/L (ref 22–32)
Calcium: 9.4 mg/dL (ref 8.9–10.3)
Chloride: 107 mmol/L (ref 98–111)
Creatinine, Ser: 1.6 mg/dL — ABNORMAL HIGH (ref 0.61–1.24)
GFR calc Af Amer: 49 mL/min — ABNORMAL LOW (ref >60)
GFR calc non Af Amer: 43 mL/min — ABNORMAL LOW (ref >60)
Glucose, Bld: 92 mg/dL (ref 70–99)
Potassium: 4.2 mmol/L (ref 3.5–5.1)
Sodium: 142 mmol/L (ref 135–145)
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
## 2019-09-03 NOTE — Progress Notes (Signed)
Occupational Therapy Session Note  Patient Details  Name: Gary [name] MRN: [id number] Date of Birth: 1945-05-07  Today's Date: 09/03/2019 OT Group Time: 1100-1200 OT Group Time Calculation (min): 60 min  Skilled Therapeutic Interventions/Progress Updates:    Pt engaged in therapeutic w/c level dance group focusing on patient choice, UE/LE strengthening, salience, activity tolerance, and social participation. Pt was guided through various dance-based exercises involving UEs/LEs and trunk. All music was selected by group members. Emphasis placed on functional cognition and activity tolerance. Pt was invited to stand but declined when given the opportunity. He participated per his own tolerance, visibly smiling behind his mask when familiar songs were played. Pt initiated conversing with other group members at times as well. At end of session he was returned to the room by RT.    Therapy Documentation Precautions:  Precautions Precautions: Fall Precaution Comments: cortrak, BUE mittens on eval Required Braces or Orthoses: Other Brace Other Brace: posey belt in chair Restrictions Weight Bearing Restrictions: No Pain: no s/s pain during tx   ADL: ADL Eating: Moderate assistance Grooming: Independent Upper Body Bathing: Supervision/safety Where Assessed-Upper Body Bathing: Sitting at sink Lower Body Bathing: Supervision/safety Where Assessed-Lower Body Bathing: Standing at sink, Sitting at sink Upper Body Dressing: Supervision/safety Where Assessed-Upper Body Dressing: Sitting at sink Lower Body Dressing: Supervision/safety Where Assessed-Lower Body Dressing: Sitting at sink, Standing at sink Toileting: Supervision/safety Where Assessed-Toileting: Glass blower/designer: Close supervision Toilet Transfer Method: Arts development officer: Energy manager: Dependent ADL Comments: +2 A for mobility for safety- able to sit to stand wiht MOD A,  however motor planning deficits impacting safety during pivot and stand.sit transitions     Therapy/Group: Group Therapy  Wray Goehring A Tevon Berhane 09/03/2019, 12:44 PM

## 2019-09-03 NOTE — Progress Notes (Signed)
Physical Therapy Session Note  Patient Details  Name: Dominion Kathan MRN: [id number] Date of Birth: 12/01/1945  Today's Date: 09/03/2019 PT Individual Time: 1300-1400 PT Individual Time Calculation (min): 60 min   Short Term Goals:  Week 3:  PT Short Term Goal 1 (Week 3): Patient will perform basic transfers with supervision consitently using LRAD. PT Short Term Goal 2 (Week 3): Patient will ambulate >100 feet with supervision using LRAD. PT Short Term Goal 3 (Week 3): Patient will perform 16 steps with min A using L rail.  Skilled Therapeutic Interventions/Progress Updates:   Pt received sitting EOB and agreeable to PT. Pt donned shoes EOB with out assist. Stand pivot transfer to [facility] with RW and distant supervision assist from PT. Pt transported to entrance of [place]. Gait training with RW 2 x 255f with supervision assist and min cues for awareness of RW and obstacles. Additional gait training without AD 3 x [id number] supervision assist and min cues for safety in turns and intermittent cues to improve step width. Pt transported to day room. Dynamic balance training on wii fit table tilt x 2 and penguin slide x 1. Cues for use of ankle strategy intermittently. Patient returned to room and left sitting EOB with call bell in reach and all needs met.         Therapy Documentation Precautions:  Precautions Precautions: Fall Precaution Comments: cortrak, BUE mittens on eval Required Braces or Orthoses: Other Brace Other Brace: posey belt in chair Restrictions Weight Bearing Restrictions: No    Pain: denies   Therapy/Group: Individual Therapy  AuLorie Phenix/28/2021, 11:59 PM

## 2019-09-03 NOTE — Progress Notes (Signed)
Occupational Therapy Discharge Summary  Patient Details  Name: Gary [name] MRN: [id number] Date of Birth: 11-Jul-1945  Today's Date: 09/03/2019 OT Individual Time: 0900-1000 OT Individual Time Calculation (min): 60 min    1:1. Pt present at beginning of session with no pain. Pt completes ambulatory transfer to gather clothing items after OT retrieves walker back and explains purpose for transpoting items and energy conservation. Pt completes grooming at sink standing with MOD I. OT edu with wife about seated ADL performance, walker bag use and Pt completes bathing and dressing with wife providing supervision and cuing for safety awareness for seated level ADL performance. Wife needs no extra input from OT carrying over strategies well. OT discusses vision findings with wife from last few days and wife verbalized strategies to carry over at home. Exited session with pt seated EOB with wife present and all needs met  Patient has met 12 of 12 long term goals due to improved activity tolerance, improved balance, postural control, ability to compensate for deficits, improved attention, improved awareness and improved coordination.  Patient to discharge at overall Supervision level.  Patient's care partner is independent to provide the necessary cognitive assistance at discharge.    Reasons goals not met: BADL/IADL  Recommendation:  Patient will benefit from ongoing skilled OT services in outpatient setting to continue to advance functional skills in the area of BADL and iADL.  Equipment: BSC/SHower chair  Reasons for discharge: treatment goals met and discharge from hospital  Patient/family agrees with progress made and goals achieved: Yes  OT Discharge Precautions/Restrictions  Precautions Precautions: Fall Required Braces or Orthoses: Other Brace Other Brace: posey belt in chair Restrictions Weight Bearing Restrictions: No General   Vital Signs Therapy Vitals Temp: 98.5 F  (36.9 C) Pulse Rate: 80 Resp: 18 BP: 129/65 Patient Position (if appropriate): Lying Oxygen Therapy SpO2: 97 % O2 Device: Room Air Pain   ADL ADL Eating: Moderate assistance Grooming: Independent Upper Body Bathing: Supervision/safety Where Assessed-Upper Body Bathing: Sitting at sink Lower Body Bathing: Supervision/safety Where Assessed-Lower Body Bathing: Standing at sink, Sitting at sink Upper Body Dressing: Supervision/safety Where Assessed-Upper Body Dressing: Sitting at sink Lower Body Dressing: Supervision/safety Where Assessed-Lower Body Dressing: Sitting at sink, Standing at sink Toileting: Supervision/safety Where Assessed-Toileting: Glass blower/designer: Close supervision Toilet Transfer Method: Arts development officer: Energy manager: Dependent ADL Comments: +2 A for mobility for safety- able to sit to stand wiht MOD A, however motor planning deficits impacting safety during pivot and stand.sit transitions Vision Vision Assessment?: Yes Ocular Range of Motion: Within Functional Limits Tracking/Visual Pursuits: Decreased smoothness of horizontal tracking;Decreased smoothness of vertical tracking Saccades: Additional eye shifts occurred during testing Visual Fields: No apparent deficits Diplopia Assessment: Present all the time/all directions Depth Perception: Overshoots Additional Comments: diplopia resolves scanning in L lower quadrant Perception  Perception: Impaired Praxis Praxis: Impaired Praxis Impairment Details: Motor planning;Initiation Praxis-Other Comments: improved significantly since eval Cognition Overall Cognitive Status: Impaired/Different from baseline Arousal/Alertness: Awake/alert Focused Attention: Impaired Decreased Long Term Memory: Functional complex Decreased Short Term Memory: Functional complex Awareness Impairment: Anticipatory impairment Problem Solving: Impaired Safety/Judgment:  Impaired [employer] Scales of Cognitive Functioning: Purposeful/appropriate Sensation Sensation Light Touch: Appears Intact Coordination Gross Motor Movements are Fluid and Coordinated: No Fine Motor Movements are Fluid and Coordinated: No Coordination and Movement Description: Visual and proprioceptive deficits impact all mobility, decreased postural control impacting standing balance and functional transfers/gait Motor  Motor Motor: Abnormal postural alignment and control Motor - Skilled  Clinical Observations: generalized deconditioning Motor - Discharge Observations: Continues to present with impaired postural control and generalized deconditioning, much improved since admission Mobility  Bed Mobility Rolling Right: Independent Rolling Left: Independent Supine to Sit: Independent Sit to Supine: Independent Transfers Sit to Stand: Supervision/Verbal cueing Stand to Sit: Supervision/Verbal cueing  Trunk/Postural Assessment  Cervical Assessment Cervical Assessment: Exceptions to [facility] Thoracic Assessment Thoracic Assessment: Exceptions to [facility] Lumbar Assessment Lumbar Assessment: Exceptions to [facility] Postural Control Postural Control: Deficits on evaluation Righting Reactions: impaired improved since eval Protective Responses: impaired improved since eval  Balance Dynamic Sitting Balance Dynamic Sitting - Level of Assistance: 5: Stand by assistance Static Standing Balance Static Standing - Level of Assistance: 5: Stand by assistance Dynamic Standing Balance Dynamic Standing - Level of Assistance: 5: Stand by assistance;4: Min assist Extremity/Trunk Assessment RUE Assessment RUE Assessment: Within Functional Limits LUE Assessment LUE Assessment: Within Functional Limits   [name] 09/03/2019, 7:29 AM

## 2019-09-04 DIAGNOSIS — N179 Acute kidney failure, unspecified: Secondary | ICD-10-CM

## 2019-09-04 NOTE — Plan of Care (Signed)
  Problem: Consults Goal: RH BRAIN INJURY PATIENT EDUCATION Description: Description: See Patient Education module for eduction specifics Outcome: Completed/Met Goal: Skin Care Protocol Initiated - if Braden Score 18 or less Description: If consults are not indicated, leave blank or document N/A Outcome: Completed/Met Goal: Nutrition Consult-if indicated Outcome: Completed/Met   Problem: RH BOWEL ELIMINATION Goal: RH STG MANAGE BOWEL WITH ASSISTANCE Description: STG Manage Bowel with min Assistance. Outcome: Completed/Met Goal: RH STG MANAGE BOWEL W/MEDICATION W/ASSISTANCE Description: STG Manage Bowel with Medication with mod Assistance. Outcome: Completed/Met   Problem: RH BLADDER ELIMINATION Goal: RH STG MANAGE BLADDER WITH ASSISTANCE Description: STG Manage Bladder With min Assistance Outcome: Completed/Met   Problem: RH SKIN INTEGRITY Goal: RH STG MAINTAIN SKIN INTEGRITY WITH ASSISTANCE Description: STG Maintain Skin Integrity With min Assistance. Outcome: Completed/Met   Problem: RH SAFETY Goal: RH STG ADHERE TO SAFETY PRECAUTIONS W/ASSISTANCE/DEVICE Description: STG Adhere to Safety Precautions With mod Assistance/Device. Outcome: Completed/Met Goal: RH STG DECREASED RISK OF FALL WITH ASSISTANCE Description: STG Decreased Risk of Fall With min Assistance. Outcome: Completed/Met   Problem: RH COGNITION-NURSING Goal: RH STG USES MEMORY AIDS/STRATEGIES W/ASSIST TO PROBLEM SOLVE Description: STG Uses Memory Aids/Strategies With min Assistance to Problem Solve. Outcome: Completed/Met Goal: RH STG ANTICIPATES NEEDS/CALLS FOR ASSIST W/ASSIST/CUES Description: STG Anticipates Needs/Calls for Assist With min Assistance/Cues. Outcome: Completed/Met   Problem: RH PAIN MANAGEMENT Goal: RH STG PAIN MANAGED AT OR BELOW [name] PAIN GOAL Description: Pt pain will be managed at stated goal of 2 Outcome: Completed/Met   Problem: RH KNOWLEDGE DEFICIT BRAIN INJURY Goal: RH STG  INCREASE KNOWLEDGE OF SELF CARE AFTER BRAIN INJURY Outcome: Completed/Met Goal: RH STG INCREASE KNOWLEDGE OF DYSPHAGIA/FLUID INTAKE Outcome: Completed/Met
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
## 2019-09-04 NOTE — Plan of Care (Signed)
  Problem: RH Balance Goal: LTG Patient will maintain dynamic standing balance (PT) Description: LTG:  Patient will maintain dynamic standing balance with assistance during mobility activities (PT) Outcome: Completed/Met   Problem: RH Car Transfers Goal: LTG Patient will perform car transfers with assist (PT) Description: LTG: Patient will perform car transfers with assistance (PT). Outcome: Completed/Met   Problem: RH Ambulation Goal: LTG Patient will ambulate in controlled environment (PT) Description: LTG: Patient will ambulate in a controlled environment, # of feet with assistance (PT). Outcome: Completed/Met Goal: LTG Patient will ambulate in home environment (PT) Description: LTG: Patient will ambulate in home environment, # of feet with assistance (PT). Outcome: Completed/Met   Problem: RH Stairs Goal: LTG Patient will ambulate up and down stairs w/assist (PT) Description: LTG: Patient will ambulate up and down # of stairs with assistance (PT) Outcome: Completed/Met   Problem: RH Memory Goal: LTG Patient will demonstrate ability for day to day recall/carry over during activities of daily living with assistance level (PT) Description: LTG:  Patient will demonstrate ability for day to day recall/carry over during activities of daily living with assistance level (PT). Outcome: Completed/Met

## 2019-09-04 NOTE — Progress Notes (Signed)
Patient discharged at 29 to home with family. Discharge instructions given and reviewed with family. Family reports they have all equipment. The will follow up with work comp regarding home health.

## 2019-09-05 ENCOUNTER — Telehealth: Payer: Self-pay

## 2019-09-05 NOTE — Progress Notes (Signed)
Inpatient Rehabilitation Care Coordinator  Discharge Note  The overall goal for the admission was met for:   Discharge location: Yes. D/c to home with 24/7 care provided by pt wife and dtr.   Length of Stay: Yes. 23 days.  Discharge activity level: Yes. 24/7 care due to cognition.  Home/community participation: Yes. Limited.   Services provided included: MD, RD, PT, OT, SLP, RN, CM, TR, Pharmacy, Neuropsych and SW  Financial Services: Worker's Comp  Follow-up services arranged: worker's comp has been working on arranging [employer], and aide services. DME: hospital bed, RW, 3in1 BSC, and shower chair to be delivered by worker's comp contracted agency.   Comments (or additional information):contact pt wife [name] [phone]   Patient/Family verbalized understanding of follow-up arrangements: Yes  Individual responsible for coordination of the follow-up plan: Pt to have assistance with coordinating care needs.   Confirmed correct DME delivered: [name] 09/05/2019    [name]

## 2019-09-05 NOTE — Telephone Encounter (Signed)
SW received phone call fromRN/CM Gary [name] (p:[phone]/[phone]) informing that she performed wellness check in this morning with patient wife and was informed that all was going well, and pt was up going ot bathroom on his own. Reports pt wife asked for an aide for someone to keep an eye on him. Gary [name] states that she has been able to get some the aide hours staffed for pt, however, still working on [facility] therapies and exploring with [facility]. She requested updated clinicals with d/c summary. SW provided date/time of upcoming appointments. SW faxed over requested items.

## 2019-09-06 ENCOUNTER — Telehealth: Payer: Self-pay | Admitting: *Deleted

## 2019-09-06 NOTE — Telephone Encounter (Signed)
Transition Care Management Unsuccessful Follow-up Telephone Call  Date of discharge and from where:  09/04/19 from [facility]  Attempts:  1st Attempt  Reason for unsuccessful TCM follow-up call:  Left voice message with date of return to office for appt with Dr Naaman Plummer 09/21/19 @9 :00 and to watch for packet in mail from our office.

## 2019-09-09 ENCOUNTER — Telehealth: Payer: Self-pay | Admitting: *Deleted

## 2019-09-09 ENCOUNTER — Encounter: Payer: Self-pay | Admitting: Physician Assistant

## 2019-09-09 DIAGNOSIS — S069X3S Unspecified intracranial injury with loss of consciousness of 1 hour to 5 hours 59 minutes, sequela: Secondary | ICD-10-CM

## 2019-09-09 NOTE — Telephone Encounter (Signed)
Faxed to [place]

## 2019-09-09 NOTE — Telephone Encounter (Signed)
Meredeth Ide CM called to report she has been unable to find a [facility] agency to go out to [place] but Mr Honeyman is doing well and she is requesting an order for outpt OT PT ST to be faxed to her [phone] for neuro rehab.  Marland Kitchen

## 2019-09-09 NOTE — Telephone Encounter (Signed)
Orders in thanks

## 2019-09-09 NOTE — Telephone Encounter (Addendum)
They did not call back in response to the VM left. This patient is actually a hospital follow up and since appt info was given on VM, this encounter will be closed.

## 2019-09-20 ENCOUNTER — Ambulatory Visit
Payer: No Typology Code available for payment source | Attending: Physical Medicine & Rehabilitation | Admitting: Physical Therapy

## 2019-09-20 ENCOUNTER — Other Ambulatory Visit: Payer: Self-pay

## 2019-09-20 ENCOUNTER — Encounter: Payer: Self-pay | Admitting: Physical Therapy

## 2019-09-20 DIAGNOSIS — R2689 Other abnormalities of gait and mobility: Secondary | ICD-10-CM | POA: Diagnosis not present

## 2019-09-20 DIAGNOSIS — M6281 Muscle weakness (generalized): Secondary | ICD-10-CM | POA: Diagnosis present

## 2019-09-20 DIAGNOSIS — R2681 Unsteadiness on feet: Secondary | ICD-10-CM | POA: Insufficient documentation

## 2019-09-21 ENCOUNTER — Encounter: Payer: Self-pay | Admitting: Physical Medicine & Rehabilitation

## 2019-09-21 ENCOUNTER — Encounter
Payer: No Typology Code available for payment source | Attending: Physical Medicine & Rehabilitation | Admitting: Physical Medicine & Rehabilitation

## 2019-09-21 ENCOUNTER — Other Ambulatory Visit: Payer: Self-pay

## 2019-09-21 VITALS — BP 136/71 | HR 104 | Temp 97.9°F | Ht 70.0 in | Wt 153.0 lb

## 2019-09-21 DIAGNOSIS — G44329 Chronic post-traumatic headache, not intractable: Secondary | ICD-10-CM | POA: Insufficient documentation

## 2019-09-21 DIAGNOSIS — F068 Other specified mental disorders due to known physiological condition: Secondary | ICD-10-CM | POA: Insufficient documentation

## 2019-09-21 DIAGNOSIS — G44321 Chronic post-traumatic headache, intractable: Secondary | ICD-10-CM | POA: Insufficient documentation

## 2019-09-21 DIAGNOSIS — S069X0S Unspecified intracranial injury without loss of consciousness, sequela: Secondary | ICD-10-CM

## 2019-09-21 DIAGNOSIS — T148XXA Other injury of unspecified body region, initial encounter: Secondary | ICD-10-CM | POA: Diagnosis present

## 2019-09-21 DIAGNOSIS — L089 Local infection of the skin and subcutaneous tissue, unspecified: Secondary | ICD-10-CM | POA: Diagnosis not present

## 2019-09-21 MED ORDER — TOPIRAMATE 25 MG PO TABS: 25.0000 mg | ORAL_TABLET | Freq: Every day | ORAL | 2 refills | Status: DC

## 2019-09-21 MED ORDER — AMPHETAMINE-DEXTROAMPHET ER 20 MG PO CP24: 40.0000 mg | ORAL_CAPSULE | Freq: Every day | ORAL | 0 refills | Status: DC
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
## 2019-09-21 NOTE — Patient Instructions (Signed)
PLEASE FEEL FREE TO CALL OUR OFFICE WITH ANY PROBLEMS OR QUESTIONS ([phone])      
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
## 2019-09-23 ENCOUNTER — Ambulatory Visit (INDEPENDENT_AMBULATORY_CARE_PROVIDER_SITE_OTHER): Payer: No Typology Code available for payment source | Admitting: Plastic Surgery

## 2019-09-23 ENCOUNTER — Encounter: Payer: Self-pay | Admitting: Plastic Surgery

## 2019-09-23 ENCOUNTER — Other Ambulatory Visit: Payer: Self-pay

## 2019-09-23 DIAGNOSIS — S0993XA Unspecified injury of face, initial encounter: Secondary | ICD-10-CM
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
## 2019-09-28 ENCOUNTER — Encounter: Payer: Self-pay | Admitting: Speech Pathology

## 2019-09-28 ENCOUNTER — Other Ambulatory Visit: Payer: Self-pay

## 2019-09-28 ENCOUNTER — Ambulatory Visit: Payer: No Typology Code available for payment source | Admitting: Speech Pathology

## 2019-09-28 ENCOUNTER — Ambulatory Visit: Payer: No Typology Code available for payment source

## 2019-09-28 DIAGNOSIS — R2689 Other abnormalities of gait and mobility: Secondary | ICD-10-CM | POA: Diagnosis not present

## 2019-09-28 DIAGNOSIS — R41841 Cognitive communication deficit: Secondary | ICD-10-CM

## 2019-09-28 DIAGNOSIS — M6281 Muscle weakness (generalized): Secondary | ICD-10-CM

## 2019-09-28 DIAGNOSIS — R2681 Unsteadiness on feet: Secondary | ICD-10-CM | POA: Insufficient documentation
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
## 2019-09-28 NOTE — Patient Instructions (Signed)
     Get the persons attention before you speak  Use eye contact and face the person you are speaking to  Be in close proximity to the person you are speaking to  Turn down any noise in the environment such as the TV, walk away from loud appliances, air conditioners, fans, dish washers etc  Repeat back what you have heard or write notes in your notebook  Bring in your memory binder  Make section for PT/OT/ST  Put therapy calendar in the front of the binder  Pill organizer - use it - have an alarm or some reminder to take your am meds - our goal is for [facility] not to have to remind you

## 2019-10-04 ENCOUNTER — Other Ambulatory Visit: Payer: Self-pay

## 2019-10-04 ENCOUNTER — Encounter: Payer: Self-pay | Admitting: Occupational Therapy

## 2019-10-04 ENCOUNTER — Ambulatory Visit: Payer: No Typology Code available for payment source | Admitting: Occupational Therapy

## 2019-10-04 ENCOUNTER — Ambulatory Visit: Payer: No Typology Code available for payment source | Attending: Physician Assistant | Admitting: Physical Therapy

## 2019-10-04 DIAGNOSIS — M6281 Muscle weakness (generalized): Secondary | ICD-10-CM

## 2019-10-04 DIAGNOSIS — R41842 Visuospatial deficit: Secondary | ICD-10-CM | POA: Insufficient documentation

## 2019-10-04 DIAGNOSIS — M25612 Stiffness of left shoulder, not elsewhere classified: Secondary | ICD-10-CM | POA: Insufficient documentation

## 2019-10-04 DIAGNOSIS — M25512 Pain in left shoulder: Secondary | ICD-10-CM | POA: Insufficient documentation

## 2019-10-04 DIAGNOSIS — R2689 Other abnormalities of gait and mobility: Secondary | ICD-10-CM | POA: Insufficient documentation

## 2019-10-04 DIAGNOSIS — R41840 Attention and concentration deficit: Secondary | ICD-10-CM

## 2019-10-04 DIAGNOSIS — R2681 Unsteadiness on feet: Secondary | ICD-10-CM | POA: Insufficient documentation

## 2019-10-04 DIAGNOSIS — R278 Other lack of coordination: Secondary | ICD-10-CM | POA: Insufficient documentation

## 2019-10-04 DIAGNOSIS — R41844 Frontal lobe and executive function deficit: Secondary | ICD-10-CM | POA: Insufficient documentation
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
## 2019-10-04 NOTE — Patient Instructions (Signed)
[facility] HEP:  Perform at least 3 times per day. Stop if your eye becomes fatigued or hurts and try again later.  1. Hold a small object/card in front of you.  Hold it in the middle at arm's length away.    2. Cover your LEFT eye and look at the object with your RIGHT eye.  3. Slowly move the object side to side in front of you while continuing to watch it with your RIGHT eye.  4.  Remember to keep your head still and only move your eye.  5.  Repeat 5-10 times.  6.  Then, move object up and down while watching it 5-10 times.  7. Cover your RIGHT eye and look at the object with your LEFT eye while you repeat #1-6 above.  8.  Now, uncover both eyes and try to focus on the object while holding it in the middle.  Try to make it 1 image.   9.  If you can, try to hold it for 10-30 sec increasing as able.    10.  Once you can make the image 1 for at least 30 sec in the middle, repeat #1-6 above with both eyes moving slowly and only in the range that you can keep the image 1.  
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
## 2019-10-05 ENCOUNTER — Other Ambulatory Visit: Payer: Self-pay | Admitting: Physical Medicine and Rehabilitation

## 2019-10-07 ENCOUNTER — Ambulatory Visit: Payer: No Typology Code available for payment source

## 2019-10-07 ENCOUNTER — Other Ambulatory Visit: Payer: Self-pay

## 2019-10-07 ENCOUNTER — Ambulatory Visit: Payer: No Typology Code available for payment source | Attending: Physician Assistant | Admitting: Physical Therapy

## 2019-10-07 DIAGNOSIS — M25612 Stiffness of left shoulder, not elsewhere classified: Secondary | ICD-10-CM | POA: Diagnosis present

## 2019-10-07 DIAGNOSIS — R2681 Unsteadiness on feet: Secondary | ICD-10-CM | POA: Insufficient documentation

## 2019-10-07 DIAGNOSIS — M6281 Muscle weakness (generalized): Secondary | ICD-10-CM | POA: Diagnosis not present

## 2019-10-07 DIAGNOSIS — R498 Other voice and resonance disorders: Secondary | ICD-10-CM | POA: Insufficient documentation

## 2019-10-07 DIAGNOSIS — M25512 Pain in left shoulder: Secondary | ICD-10-CM | POA: Diagnosis present

## 2019-10-07 DIAGNOSIS — R41842 Visuospatial deficit: Secondary | ICD-10-CM | POA: Diagnosis present

## 2019-10-07 DIAGNOSIS — R41841 Cognitive communication deficit: Secondary | ICD-10-CM

## 2019-10-07 DIAGNOSIS — R2689 Other abnormalities of gait and mobility: Secondary | ICD-10-CM | POA: Diagnosis present

## 2019-10-07 DIAGNOSIS — R41840 Attention and concentration deficit: Secondary | ICD-10-CM | POA: Insufficient documentation

## 2019-10-07 DIAGNOSIS — R41844 Frontal lobe and executive function deficit: Secondary | ICD-10-CM | POA: Diagnosis present
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
## 2019-10-11 ENCOUNTER — Ambulatory Visit: Payer: No Typology Code available for payment source | Admitting: Speech Pathology

## 2019-10-11 ENCOUNTER — Other Ambulatory Visit: Payer: Self-pay

## 2019-10-11 ENCOUNTER — Encounter: Payer: Self-pay | Admitting: Occupational Therapy

## 2019-10-11 ENCOUNTER — Ambulatory Visit: Payer: No Typology Code available for payment source | Admitting: Occupational Therapy

## 2019-10-11 ENCOUNTER — Ambulatory Visit: Payer: No Typology Code available for payment source | Admitting: Physical Therapy

## 2019-10-11 DIAGNOSIS — R41841 Cognitive communication deficit: Secondary | ICD-10-CM

## 2019-10-11 DIAGNOSIS — R2681 Unsteadiness on feet: Secondary | ICD-10-CM

## 2019-10-11 DIAGNOSIS — R41840 Attention and concentration deficit: Secondary | ICD-10-CM

## 2019-10-11 DIAGNOSIS — R41842 Visuospatial deficit: Secondary | ICD-10-CM

## 2019-10-11 DIAGNOSIS — M25612 Stiffness of left shoulder, not elsewhere classified: Secondary | ICD-10-CM

## 2019-10-11 DIAGNOSIS — M6281 Muscle weakness (generalized): Secondary | ICD-10-CM

## 2019-10-11 DIAGNOSIS — M25512 Pain in left shoulder: Secondary | ICD-10-CM

## 2019-10-11 DIAGNOSIS — R498 Other voice and resonance disorders: Secondary | ICD-10-CM

## 2019-10-11 DIAGNOSIS — R2689 Other abnormalities of gait and mobility: Secondary | ICD-10-CM
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
## 2019-10-11 NOTE — Patient Instructions (Addendum)
Bring a 3 ring binder for therapy  Abdominal Breathing : 15 minutes, twice a day   . Lie down on your back with a plastic cup on your belly . Breathe in through your nose and fill your belly with air, watching your belly move up with inhalation and down with exhalation. . Breathe out through your mouth and watch your belly move in. An audible "sh"  may help   Think of your belly as a balloon, when you fill with air (inhale), the balloon gets bigger. As the air goes out (exhale), the balloon deflates.  Breathe in for a count of 5 and breathe out for a count of 5   Loud "Ahhh!" Use effort 8/10, 5 times, twice a day.  Read these phrases out loud, same effort  1. "I could eat!" 2. "Where's Charlie?" 3. "Get off of my chair!" 4. "What's the weather?" 5. "Is it cold in here?" 6. 7. 8. 9. 10.

## 2019-10-11 NOTE — Patient Instructions (Addendum)
Diplopia HEP:  Perform at least 1-2 times per day. Stop if your eye becomes fatigued or hurts and try again later.  1. Hold a small object/card in front of you.  Hold it in the middle at arm's length away.    2. Cover your LEFT eye and look at the object with your RIGHT eye.  3. Slowly move the object side to side in front of you while continuing to watch it with your RIGHT eye.  4.  Remember to keep your head still and only move your eye.  5.  Repeat 5-10 times.  6.  Then, move object up and down while watching it 5-10 times.  7. Cover your RIGHT eye and look at the object with your LEFT eye while you repeat #1-6 above.  8.  Now, uncover both eyes and try to focus on the object while holding it in the middle.  Try to make it 1 image.   9.  If you can, try to hold it for 10-30 sec increasing as able.    10.  Once you can make the image 1 for at least 30 sec in the middle, repeat #1-6 above with both eyes moving slowly and only in the range that you can keep the image 1.    Cane Overhead - Supine  Hold cane at thighs with both hands, extend arms straight over head. Hold 5 seconds. Repeat 10  times. Do 1-2  times per day.  Perform chest press 10 reps laying on your back 2x day
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
## 2019-10-13 ENCOUNTER — Ambulatory Visit: Payer: No Typology Code available for payment source | Admitting: Speech Pathology

## 2019-10-13 ENCOUNTER — Ambulatory Visit: Payer: No Typology Code available for payment source | Admitting: Physical Therapy

## 2019-10-13 ENCOUNTER — Ambulatory Visit: Payer: No Typology Code available for payment source | Admitting: Occupational Therapy

## 2019-10-13 ENCOUNTER — Other Ambulatory Visit: Payer: Self-pay

## 2019-10-13 DIAGNOSIS — R2689 Other abnormalities of gait and mobility: Secondary | ICD-10-CM

## 2019-10-13 DIAGNOSIS — M6281 Muscle weakness (generalized): Secondary | ICD-10-CM | POA: Diagnosis not present

## 2019-10-13 DIAGNOSIS — R41841 Cognitive communication deficit: Secondary | ICD-10-CM

## 2019-10-13 DIAGNOSIS — R2681 Unsteadiness on feet: Secondary | ICD-10-CM
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
## 2019-10-18 ENCOUNTER — Ambulatory Visit: Payer: No Typology Code available for payment source | Admitting: Occupational Therapy

## 2019-10-18 ENCOUNTER — Other Ambulatory Visit: Payer: Self-pay

## 2019-10-18 ENCOUNTER — Ambulatory Visit: Payer: No Typology Code available for payment source | Admitting: Speech Pathology

## 2019-10-18 ENCOUNTER — Ambulatory Visit: Payer: No Typology Code available for payment source | Admitting: Physical Therapy

## 2019-10-18 DIAGNOSIS — R41841 Cognitive communication deficit: Secondary | ICD-10-CM

## 2019-10-18 DIAGNOSIS — M6281 Muscle weakness (generalized): Secondary | ICD-10-CM | POA: Diagnosis not present

## 2019-10-18 DIAGNOSIS — R2681 Unsteadiness on feet: Secondary | ICD-10-CM

## 2019-10-18 DIAGNOSIS — R2689 Other abnormalities of gait and mobility: Secondary | ICD-10-CM

## 2019-10-18 NOTE — Patient Instructions (Signed)
30 minutes brain exercise every day:   Cognitive Activities you can do at home:   - [place] (easy level)  - [place]  On your computer, tablet or phone: BrainHQ Constant Therapy (free trial period) [email or web address] Neuronation App [employer] Game App [employer] IQ Logic [place] Sort it out (easy) Photo Quiz  - what's the word Mix 2 Words Spot the difference games

## 2019-10-18 NOTE — Patient Instructions (Signed)
Access Code: [email or web address] URL: [email or web address] Date: 10/18/2019 Prepared by: Mady Haagensen  Backward Walking with Counter Support - 1-2 x daily - 5 x weekly - 1 sets - 3-5 reps Walking with Head Rotation - 1-2 x daily - 5 x weekly - 1 sets - 3-5 reps Tandem Walking with Counter Support - 1-2 x daily - 5 x weekly - 1 sets - 3-5 reps  Previous HEP from beginning of POC:  Access Code: [email or web address] URL: [email or web address] Date: 09/28/2019 Prepared by: Baldomero Lamy  Sit to Stand - 1 x daily - 5 x weekly - 3 sets - 10 reps Standing March with Counter Support - 1 x daily - 5 x weekly - 3 sets - 10 reps Heel Toe Raises with Counter Support - 1 x daily - 5 x weekly - 3 sets - 10 reps Side Stepping with Resistance at Thighs and Counter Support - 1 x daily - 5 x weekly - 3 sets - 10 reps - completed with red theraband.  Romberg Stance Eyes Closed on Foam Pad - 1 x daily - 5 x weekly - 1 sets - 3 reps - 30 seconds hold Romberg Stance on Foam Pad with Head Rotation - 1 x daily - 5 x weekly - 2 sets - 10 reps  Discussed performing HEP, 2x/day
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
## 2019-10-20 ENCOUNTER — Ambulatory Visit: Payer: No Typology Code available for payment source | Admitting: Speech Pathology

## 2019-10-20 ENCOUNTER — Telehealth: Payer: Self-pay | Admitting: Speech Pathology

## 2019-10-20 ENCOUNTER — Other Ambulatory Visit: Payer: Self-pay

## 2019-10-20 ENCOUNTER — Encounter: Payer: Medicare Other | Admitting: Occupational Therapy

## 2019-10-20 ENCOUNTER — Telehealth: Payer: Self-pay | Admitting: Physical Therapy

## 2019-10-20 ENCOUNTER — Ambulatory Visit: Payer: No Typology Code available for payment source | Admitting: Physical Therapy

## 2019-10-20 DIAGNOSIS — M6281 Muscle weakness (generalized): Secondary | ICD-10-CM

## 2019-10-20 DIAGNOSIS — R2681 Unsteadiness on feet: Secondary | ICD-10-CM

## 2019-10-20 DIAGNOSIS — R41841 Cognitive communication deficit: Secondary | ICD-10-CM

## 2019-10-20 DIAGNOSIS — R2689 Other abnormalities of gait and mobility: Secondary | ICD-10-CM
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
## 2019-10-20 NOTE — Patient Instructions (Signed)
Make a section in your notebook to keep all of your notes in one place, or try a small notebook for your pocket. Remember to write down what you need to remember RIGHT AWAY.  Homework: Take notes/ be prepared to summarize either a) a chapter of a book you read or listen to, b) a TV show you watch, c) a radio program or podcast. See below for suggestions.    Mind Your Brain: https://www.mindyourbrainfoundation.org/podcast/ [employer] Brain Injury Talk Radio Audiobooks through [employer])

## 2019-10-20 NOTE — Telephone Encounter (Signed)
Patient would benefit from use of a single point cane with offset handle for gait in the home and community.  With gait training in PT session today using a single point cane, he demonstrated improved stride length and improved arm swing with cues and practice through session.  He would benefit from a single point cane to help with stability and independence with gait.  Could you please place order for single point cane DME for patient?    Thank you.  Mady Haagensen, PT 10/20/19 2:01 PM Phone: [phone] Fax: [phone]
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
## 2019-10-20 NOTE — Telephone Encounter (Signed)
Received voicemail from [facility] confirming additional 12 visits are approved for ST, PT, and OT per discipline.   Deneise Lever, MS, Actor

## 2019-10-20 NOTE — Telephone Encounter (Signed)
Left message for worker's comp claims adjuster, [employer] asking about additional approved visits.  Left message stating that last week, we faxed PT treatment notes and request for 10 additional visits.  Staff member left message yesterday afternoon to ask about additional authorization.  As patient is scheduled this morning, PT was trying to clarify.  Asked claim adjuster to return phone call or fax with update on approved additional visits or contact therapist if additional information is needed.   Mady Haagensen, PT 10/20/19 8:22 AM Phone: [phone] Fax: [phone]

## 2019-10-24 ENCOUNTER — Ambulatory Visit: Payer: No Typology Code available for payment source | Admitting: Occupational Therapy

## 2019-10-24 ENCOUNTER — Ambulatory Visit: Payer: No Typology Code available for payment source

## 2019-10-24 ENCOUNTER — Encounter: Payer: Self-pay | Admitting: Occupational Therapy

## 2019-10-24 ENCOUNTER — Ambulatory Visit: Payer: No Typology Code available for payment source | Admitting: Physical Therapy

## 2019-10-24 ENCOUNTER — Other Ambulatory Visit: Payer: Self-pay

## 2019-10-24 VITALS — BP 130/74 | HR 87

## 2019-10-24 DIAGNOSIS — M6281 Muscle weakness (generalized): Secondary | ICD-10-CM | POA: Diagnosis not present

## 2019-10-24 DIAGNOSIS — R41841 Cognitive communication deficit: Secondary | ICD-10-CM

## 2019-10-24 DIAGNOSIS — R2681 Unsteadiness on feet: Secondary | ICD-10-CM

## 2019-10-24 DIAGNOSIS — R2689 Other abnormalities of gait and mobility: Secondary | ICD-10-CM

## 2019-10-24 DIAGNOSIS — R41842 Visuospatial deficit: Secondary | ICD-10-CM

## 2019-10-24 DIAGNOSIS — R41844 Frontal lobe and executive function deficit: Secondary | ICD-10-CM

## 2019-10-24 DIAGNOSIS — M25612 Stiffness of left shoulder, not elsewhere classified: Secondary | ICD-10-CM

## 2019-10-24 DIAGNOSIS — M25512 Pain in left shoulder: Secondary | ICD-10-CM

## 2019-10-24 DIAGNOSIS — R41840 Attention and concentration deficit: Secondary | ICD-10-CM
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
## 2019-10-24 NOTE — Patient Instructions (Signed)
   Continue your homework from last session - each day, write down notes from a 10-15 minute news story, TV show, radio show or podcast and give a summary of each to the speech therapist next session.  Consider writing a schedule for the next day - do this sometime after 2:00pm if you think it would help to keep you more organized.

## 2019-10-25 NOTE — Telephone Encounter (Signed)
I can write an rx requesting the life alert device. Please remind me at office tomorrow.  Thx!

## 2019-10-27 ENCOUNTER — Other Ambulatory Visit: Payer: Self-pay

## 2019-10-27 ENCOUNTER — Ambulatory Visit: Payer: No Typology Code available for payment source | Admitting: Occupational Therapy

## 2019-10-27 ENCOUNTER — Ambulatory Visit: Payer: No Typology Code available for payment source | Admitting: Speech Pathology

## 2019-10-27 ENCOUNTER — Encounter: Payer: Self-pay | Admitting: Occupational Therapy

## 2019-10-27 ENCOUNTER — Ambulatory Visit: Payer: No Typology Code available for payment source | Admitting: Physical Therapy

## 2019-10-27 VITALS — BP 140/86

## 2019-10-27 DIAGNOSIS — R41840 Attention and concentration deficit: Secondary | ICD-10-CM

## 2019-10-27 DIAGNOSIS — M6281 Muscle weakness (generalized): Secondary | ICD-10-CM | POA: Diagnosis not present

## 2019-10-27 DIAGNOSIS — R41844 Frontal lobe and executive function deficit: Secondary | ICD-10-CM

## 2019-10-27 DIAGNOSIS — R2689 Other abnormalities of gait and mobility: Secondary | ICD-10-CM

## 2019-10-27 DIAGNOSIS — M25612 Stiffness of left shoulder, not elsewhere classified: Secondary | ICD-10-CM

## 2019-10-27 DIAGNOSIS — R2681 Unsteadiness on feet: Secondary | ICD-10-CM

## 2019-10-27 DIAGNOSIS — M25512 Pain in left shoulder: Secondary | ICD-10-CM

## 2019-10-27 DIAGNOSIS — R41842 Visuospatial deficit: Secondary | ICD-10-CM
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
## 2019-10-28 ENCOUNTER — Other Ambulatory Visit: Payer: Self-pay

## 2019-10-28 ENCOUNTER — Encounter: Payer: Self-pay | Admitting: Psychology

## 2019-10-28 DIAGNOSIS — S069X0S Unspecified intracranial injury without loss of consciousness, sequela: Secondary | ICD-10-CM

## 2019-10-28 DIAGNOSIS — F068 Other specified mental disorders due to known physiological condition: Secondary | ICD-10-CM

## 2019-10-31 MED ORDER — AMPHETAMINE-DEXTROAMPHET ER 20 MG PO CP24: 40.0000 mg | ORAL_CAPSULE | Freq: Every day | ORAL | 0 refills | Status: DC

## 2019-11-01 ENCOUNTER — Other Ambulatory Visit: Payer: Self-pay

## 2019-11-01 ENCOUNTER — Ambulatory Visit: Payer: No Typology Code available for payment source | Admitting: Speech Pathology

## 2019-11-01 ENCOUNTER — Ambulatory Visit: Payer: No Typology Code available for payment source | Admitting: Physical Therapy

## 2019-11-01 ENCOUNTER — Encounter: Payer: Self-pay | Admitting: Occupational Therapy

## 2019-11-01 ENCOUNTER — Ambulatory Visit: Payer: No Typology Code available for payment source | Admitting: Occupational Therapy

## 2019-11-01 DIAGNOSIS — R41842 Visuospatial deficit: Secondary | ICD-10-CM

## 2019-11-01 DIAGNOSIS — R2681 Unsteadiness on feet: Secondary | ICD-10-CM

## 2019-11-01 DIAGNOSIS — R2689 Other abnormalities of gait and mobility: Secondary | ICD-10-CM

## 2019-11-01 DIAGNOSIS — M25612 Stiffness of left shoulder, not elsewhere classified: Secondary | ICD-10-CM

## 2019-11-01 DIAGNOSIS — M6281 Muscle weakness (generalized): Secondary | ICD-10-CM

## 2019-11-01 DIAGNOSIS — M25512 Pain in left shoulder: Secondary | ICD-10-CM

## 2019-11-01 DIAGNOSIS — R41841 Cognitive communication deficit: Secondary | ICD-10-CM

## 2019-11-01 DIAGNOSIS — R41840 Attention and concentration deficit: Secondary | ICD-10-CM

## 2019-11-01 DIAGNOSIS — R41844 Frontal lobe and executive function deficit: Secondary | ICD-10-CM
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
## 2019-11-01 NOTE — Patient Instructions (Signed)
Try keeping a basket on your coffee table to keep your notebook, glasses and phone. If [name] sees you put one of those items down in the wrong place, she should ask you, "where should this go?" to remind you to keep it in the same place. Check the basket before you go somewhere so that you can bring everything with you.   Write a daily schedule in your red notebook at the beginning of the day. Talk with Candy about your plans for the day: write down any appointments, outings. Write down if you will do any chores, make any phone calls, and what time you will do your exercises that day. Think of it as making a "to do list" for each day. Bring the notebook with you next time you come to therapy.
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
## 2019-11-02 ENCOUNTER — Telehealth: Payer: Self-pay | Admitting: Physical Therapy

## 2019-11-02 NOTE — Telephone Encounter (Signed)
The cane provided by worker's comp is a bariatric cane, with a heavier weight, and it is too tall for the patient.  The lowest setting of this cane is too tall for Gary [name].  Could worker's comp please provide him with a single point cane, regular height and weight?  Thank you.  Mady Haagensen, PT 11/02/19 7:06 PM Phone: [phone] Fax: [phone]
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
## 2019-11-03 ENCOUNTER — Ambulatory Visit: Payer: No Typology Code available for payment source | Admitting: Speech Pathology

## 2019-11-03 ENCOUNTER — Other Ambulatory Visit: Payer: Self-pay

## 2019-11-03 ENCOUNTER — Ambulatory Visit: Payer: No Typology Code available for payment source | Admitting: Physical Therapy

## 2019-11-03 ENCOUNTER — Ambulatory Visit: Payer: No Typology Code available for payment source | Admitting: Occupational Therapy

## 2019-11-03 ENCOUNTER — Encounter: Payer: Self-pay | Admitting: Occupational Therapy

## 2019-11-03 DIAGNOSIS — R41844 Frontal lobe and executive function deficit: Secondary | ICD-10-CM

## 2019-11-03 DIAGNOSIS — M6281 Muscle weakness (generalized): Secondary | ICD-10-CM

## 2019-11-03 DIAGNOSIS — R41840 Attention and concentration deficit: Secondary | ICD-10-CM

## 2019-11-03 DIAGNOSIS — M25512 Pain in left shoulder: Secondary | ICD-10-CM

## 2019-11-03 DIAGNOSIS — R2681 Unsteadiness on feet: Secondary | ICD-10-CM

## 2019-11-03 DIAGNOSIS — M25612 Stiffness of left shoulder, not elsewhere classified: Secondary | ICD-10-CM

## 2019-11-03 DIAGNOSIS — R41841 Cognitive communication deficit: Secondary | ICD-10-CM

## 2019-11-03 DIAGNOSIS — R41842 Visuospatial deficit: Secondary | ICD-10-CM

## 2019-11-03 DIAGNOSIS — R2689 Other abnormalities of gait and mobility: Secondary | ICD-10-CM

## 2019-11-03 NOTE — Patient Instructions (Addendum)
Homework:  1. Go through your basket and remove the junk. Keep it JUST for things like your phone, notebook, calendar.  2. Finish writing your A Schedule (wake up 8am - no therapy), and B schedule (wake up 6am for therapy days). Remember to include walks, exercise, brain activities, meds, chores) 3. Bring your proposed schedules and your red notebook next time  Talk to [facility] and agree on more regular time for you to get up in the morning. See about getting an alarm clock if you're not keeping your phone with you.

## 2019-11-03 NOTE — Patient Instructions (Addendum)
   1. Stop when you enter a new environment and scan around to get your bearings, assess potential problems. 2. Make sure you scan from side to side if in busy environment, parking lot, etc. 3. Use an organized scanning pattern. It's usually easier to scan from top to bottom, and left to right (like you are reading) 4. Double check yourself 5. Use a line guide (like a blank piece of paper) or your finger when reading 6.. Large print will be easier to see. 7. Organize items in baskets and/or with labels so they are easier to see/find 8. Remove clutter 9. Make sure you have good lighting. 10. Consider using contrast/brightly colored strips if you are bumping into things    Visual Activities: 1. Simple word search 2. Read simple paragraphs out loud 3. With someone with you, look for items in grocery store 4. Work simple jigsaw puzzles 5. Play simple matching games online/tablet that make you search visually. 6.  Play board/card games (memory/matching, solitaire, connect 4, etc.) 7.  Try patching one eye while performing seated visual tasks for 15 min at a time (then switch eyes) to exercise both eyes.

## 2019-11-03 NOTE — Patient Instructions (Signed)
Access Code: [email or web address] URL: [email or web address] Date: 11/03/2019 Prepared by: Mady Haagensen  Exercises Backward Walking with Counter Support - 1-2 x daily - 5 x weekly - 1 sets - 3-5 reps Walking with Head Rotation - 1-2 x daily - 5 x weekly - 1 sets - 3-5 reps Tandem Walking with Counter Support - 1-2 x daily - 5 x weekly - 1 sets - 3-5 reps Seated March with Resistance - 1 x daily - 5 x weekly - 2-3 sets - 10 reps Sitting Knee Extension with Resistance - 1 x daily - 5 x weekly - 2-3 sets - 10 reps Seated Hamstring Curl with Anchored Resistance - 1 x daily - 5 x weekly - 2-3 sets - 10 reps Seated Ankle Dorsiflexion with Resistance - 1 x daily - 5 x weekly - 2-3 sets - 10 reps Seated Scapular Retraction - 1 x daily - 5 x weekly - 2-3 sets - 10 reps - 3 sec hold
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
## 2019-11-08 ENCOUNTER — Ambulatory Visit (INDEPENDENT_AMBULATORY_CARE_PROVIDER_SITE_OTHER): Payer: 59 | Admitting: Physician Assistant

## 2019-11-08 ENCOUNTER — Encounter: Payer: Self-pay | Admitting: Plastic Surgery

## 2019-11-08 ENCOUNTER — Ambulatory Visit: Payer: Medicare Other | Admitting: Physical Therapy

## 2019-11-08 ENCOUNTER — Ambulatory Visit: Payer: 59 | Admitting: Occupational Therapy

## 2019-11-08 ENCOUNTER — Ambulatory Visit: Payer: 59 | Admitting: Speech Pathology

## 2019-11-08 ENCOUNTER — Encounter: Payer: Self-pay | Admitting: Physician Assistant

## 2019-11-08 ENCOUNTER — Encounter: Payer: Medicare Other | Admitting: Occupational Therapy

## 2019-11-08 ENCOUNTER — Telehealth (INDEPENDENT_AMBULATORY_CARE_PROVIDER_SITE_OTHER): Payer: 59 | Admitting: Plastic Surgery

## 2019-11-08 ENCOUNTER — Other Ambulatory Visit: Payer: Self-pay | Admitting: Physician Assistant

## 2019-11-08 ENCOUNTER — Encounter: Payer: Self-pay | Admitting: Occupational Therapy

## 2019-11-08 ENCOUNTER — Other Ambulatory Visit: Payer: Self-pay

## 2019-11-08 ENCOUNTER — Ambulatory Visit (HOSPITAL_BASED_OUTPATIENT_CLINIC_OR_DEPARTMENT_OTHER)
Admission: RE | Admit: 2019-11-08 | Discharge: 2019-11-08 | Disposition: A | Discharge status: Home / Self Care | Payer: 59 | Source: Ambulatory Visit | Attending: Physician Assistant | Admitting: Physician Assistant

## 2019-11-08 VITALS — BP 108/60 | HR 108 | Temp 98.2°F | Resp 16 | Ht 70.0 in | Wt 155.0 lb

## 2019-11-08 DIAGNOSIS — S0993XA Unspecified injury of face, initial encounter: Secondary | ICD-10-CM | POA: Diagnosis not present

## 2019-11-08 DIAGNOSIS — M79662 Pain in left lower leg: Secondary | ICD-10-CM

## 2019-11-08 DIAGNOSIS — R41842 Visuospatial deficit: Secondary | ICD-10-CM | POA: Insufficient documentation

## 2019-11-08 DIAGNOSIS — R41841 Cognitive communication deficit: Secondary | ICD-10-CM | POA: Insufficient documentation

## 2019-11-08 DIAGNOSIS — R2689 Other abnormalities of gait and mobility: Secondary | ICD-10-CM | POA: Insufficient documentation

## 2019-11-08 DIAGNOSIS — M25612 Stiffness of left shoulder, not elsewhere classified: Secondary | ICD-10-CM | POA: Insufficient documentation

## 2019-11-08 DIAGNOSIS — M6281 Muscle weakness (generalized): Secondary | ICD-10-CM | POA: Insufficient documentation

## 2019-11-08 DIAGNOSIS — R2681 Unsteadiness on feet: Secondary | ICD-10-CM

## 2019-11-08 DIAGNOSIS — T1490XA Injury, unspecified, initial encounter: Secondary | ICD-10-CM

## 2019-11-08 MED ORDER — TRAMADOL HCL 50 MG PO TABS: 50.0000 mg | ORAL_TABLET | Freq: Three times a day (TID) | ORAL | 0 refills | Status: AC | PRN

## 2019-11-08 NOTE — Therapy (Signed)
[facility] [address] [place], [place], [address] Phone: [phone]   Fax:  [phone]  Patient Details  Name: Gary [name] MRN: [id number] Date of Birth: [birth year] Referring Provider:  Brunetta Jeans, PA-C  Encounter Date: 11/08/2019 Pt arrived today stating new onset LLE pain, significant enough that he had switched from using his cane back to using a walker. Pt demonstrates LLE swelling however he does not demonstrate significant warmth. Therapist witheld OT today and contacted PCP office. Appointment was scheduled for 2:30 pm today. Therapist reviewed CVA warning signs with pt/ wife and instructed pt's wife to call 911 if he developed symptoms. Therapist transported pt to his car via w/c. Pt left with his wife via private car, aware of his 2:30 appointment.  Gary [name] 11/08/2019, 1:09 PM  [facility] [address] [place], [place], [address] Phone: [phone]   Fax:  [phone]

## 2019-11-08 NOTE — Patient Instructions (Signed)
Please go to [facility] for imaging. We will call you with your results and alter treatment accordingly. The Imaging department is just to the R of the elevator after going in the main entrance.   Appt is at 4 -- Need to be there by 3:45 for registration.   [facility], [place] [address]  We will treat based on findings.  Elevate the leg while resting.  Tylenol if needed for mild pain.   If a clot is present we will be starting blood thinners.   Hang in there!

## 2019-11-08 NOTE — Progress Notes (Signed)
I connected with  Gary [name] on 11/08/19 by a video enabled telemedicine application and verified that I am speaking with the correct person using two identifiers. The patient was at home and I was at the office.  We spent 10 minutes.   I discussed the limitations of evaluation and management by telemedicine. The patient expressed understanding and agreed to proceed.  The patient was seen by Dr. Frederico Hamman for his eyes and double vision.  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  The wife and patient are a little concerned that the orbital plate is very prominent.  He is eating pretty good and not losing weight but he still does not have a lot of adipose particularly on his face.  I think it is very reasonable to get the plate out.  I discussed that this is not normally something that is needed but in his case I think it would be beneficial to get it out before there is any skin breakdown.  I will get this arranged.    We will plan on removal of right lateral orbital miniplate.
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
## 2019-11-09 ENCOUNTER — Other Ambulatory Visit: Payer: Self-pay | Admitting: Physician Assistant

## 2019-11-09 ENCOUNTER — Telehealth: Payer: Self-pay | Admitting: Physical Therapy

## 2019-11-09 ENCOUNTER — Telehealth: Payer: Self-pay

## 2019-11-09 MED ORDER — RIVAROXABAN 15 MG PO TABS: 15.0000 mg | ORAL_TABLET | Freq: Two times a day (BID) | ORAL | 0 refills | Status: DC

## 2019-11-09 MED ORDER — RIVAROXABAN 20 MG PO TABS: 20.0000 mg | ORAL_TABLET | Freq: Every day | ORAL | 2 refills | Status: DC

## 2019-11-09 NOTE — Telephone Encounter (Signed)
Patient had a doppler done because of leg pain and he has a blood clot and PCP wants to start Xarelto. 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 PCP needs clearance to start Xarelto.

## 2019-11-09 NOTE — Telephone Encounter (Signed)
Already addressed directly with provider. thx

## 2019-11-09 NOTE — Telephone Encounter (Signed)
Spoke with Mrs. Gillie regarding chart review that pt has a new DVT in his leg and started on medication today.  She agreed to cancel tomorrow's appointments due to pt not feeling well and starting medication.    She has questions about movement versus no movement with the blood clot.  Advised her to follow up with the MD regarding specific questions.  Mady Haagensen, PT 11/09/19 5:25 PM Phone: [phone] Fax: [phone]

## 2019-11-10 ENCOUNTER — Ambulatory Visit: Payer: 59 | Admitting: Occupational Therapy

## 2019-11-10 ENCOUNTER — Ambulatory Visit: Payer: 59 | Admitting: Speech Pathology

## 2019-11-10 ENCOUNTER — Ambulatory Visit: Payer: 59 | Admitting: Physical Therapy

## 2019-11-11 ENCOUNTER — Telehealth: Payer: Self-pay | Admitting: Physician Assistant

## 2019-11-11 ENCOUNTER — Telehealth: Payer: Self-pay | Admitting: Plastic Surgery

## 2019-11-11 NOTE — Telephone Encounter (Signed)
Patient's nurse case manager called to request 11/08/2019 televisit notes to be faxed to her. Faxed to [phone]

## 2019-11-11 NOTE — Telephone Encounter (Signed)
Rx faxed in.

## 2019-11-11 NOTE — Telephone Encounter (Signed)
[name] wife called in stating that they were to get a script for compression stockings. She would like to pick that up today.  Please advise

## 2019-11-15 ENCOUNTER — Ambulatory Visit: Payer: 59 | Admitting: Occupational Therapy

## 2019-11-15 ENCOUNTER — Ambulatory Visit: Payer: 59 | Admitting: Speech Pathology

## 2019-11-15 ENCOUNTER — Encounter: Payer: Self-pay | Admitting: Physician Assistant

## 2019-11-15 ENCOUNTER — Ambulatory Visit: Payer: 59 | Admitting: Physical Therapy

## 2019-11-16 ENCOUNTER — Other Ambulatory Visit: Payer: Self-pay

## 2019-11-16 ENCOUNTER — Encounter: Payer: Self-pay | Admitting: Physical Medicine & Rehabilitation

## 2019-11-16 ENCOUNTER — Encounter
Payer: No Typology Code available for payment source | Attending: Physical Medicine & Rehabilitation | Admitting: Physical Medicine & Rehabilitation

## 2019-11-16 ENCOUNTER — Ambulatory Visit: Payer: Medicare Other | Admitting: Psychology

## 2019-11-16 VITALS — BP 135/76 | HR 108 | Temp 97.9°F | Ht 70.0 in | Wt 157.6 lb

## 2019-11-16 DIAGNOSIS — F068 Other specified mental disorders due to known physiological condition: Secondary | ICD-10-CM | POA: Diagnosis present

## 2019-11-16 DIAGNOSIS — G44321 Chronic post-traumatic headache, intractable: Secondary | ICD-10-CM | POA: Insufficient documentation

## 2019-11-16 DIAGNOSIS — I82432 Acute embolism and thrombosis of left popliteal vein: Secondary | ICD-10-CM | POA: Diagnosis present

## 2019-11-16 DIAGNOSIS — S069X4S Unspecified intracranial injury with loss of consciousness of 6 hours to 24 hours, sequela: Secondary | ICD-10-CM | POA: Diagnosis present

## 2019-11-16 DIAGNOSIS — G44329 Chronic post-traumatic headache, not intractable: Secondary | ICD-10-CM

## 2019-11-16 DIAGNOSIS — S069X0S Unspecified intracranial injury without loss of consciousness, sequela: Secondary | ICD-10-CM

## 2019-11-16 MED ORDER — AMPHETAMINE-DEXTROAMPHET ER 20 MG PO CP24: 40.0000 mg | ORAL_CAPSULE | Freq: Every day | ORAL | 0 refills | Status: DC

## 2019-11-16 MED ORDER — HYDROCODONE-ACETAMINOPHEN 5-325 MG PO TABS: 1.0000 | ORAL_TABLET | Freq: Three times a day (TID) | ORAL | 0 refills | Status: DC | PRN

## 2019-11-16 MED ORDER — TOPIRAMATE 50 MG PO TABS: 50.0000 mg | ORAL_TABLET | Freq: Every day | ORAL | 2 refills | Status: DC

## 2019-11-16 MED ORDER — TRAMADOL HCL 50 MG PO TABS: 50.0000 mg | ORAL_TABLET | Freq: Three times a day (TID) | ORAL | 0 refills | Status: AC | PRN

## 2019-11-16 NOTE — Patient Instructions (Addendum)
PLEASE FEEL FREE TO CALL OUR OFFICE WITH ANY PROBLEMS OR QUESTIONS ([phone])   STRETCH YOUR LEFT LEG, ESPECIALLY HAMSTRINGS.  USE MOIST HEAT ON THE BACK OF YOUR LEG AND NECK DAILY UP TO THREE X DAILY.   STAY ACTIVE!!. KEEP YOUR LEG MOVING!!
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
## 2019-11-21 ENCOUNTER — Ambulatory Visit: Payer: 59 | Admitting: Physical Therapy

## 2019-11-21 ENCOUNTER — Other Ambulatory Visit: Payer: Self-pay

## 2019-11-21 ENCOUNTER — Encounter: Payer: Self-pay | Admitting: Speech Pathology

## 2019-11-21 ENCOUNTER — Ambulatory Visit: Payer: Medicare Other | Admitting: Psychology

## 2019-11-21 ENCOUNTER — Ambulatory Visit: Payer: 59 | Admitting: Occupational Therapy

## 2019-11-21 ENCOUNTER — Ambulatory Visit: Payer: 59 | Attending: Physician Assistant | Admitting: Speech Pathology

## 2019-11-21 DIAGNOSIS — R2681 Unsteadiness on feet: Secondary | ICD-10-CM | POA: Diagnosis present

## 2019-11-21 DIAGNOSIS — R41842 Visuospatial deficit: Secondary | ICD-10-CM

## 2019-11-21 DIAGNOSIS — M25612 Stiffness of left shoulder, not elsewhere classified: Secondary | ICD-10-CM | POA: Diagnosis present

## 2019-11-21 DIAGNOSIS — R41841 Cognitive communication deficit: Secondary | ICD-10-CM

## 2019-11-21 DIAGNOSIS — R2689 Other abnormalities of gait and mobility: Secondary | ICD-10-CM | POA: Diagnosis present

## 2019-11-21 DIAGNOSIS — M6281 Muscle weakness (generalized): Secondary | ICD-10-CM
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
## 2019-11-23 ENCOUNTER — Ambulatory Visit: Payer: 59 | Admitting: Speech Pathology

## 2019-11-23 ENCOUNTER — Ambulatory Visit: Payer: 59 | Admitting: Occupational Therapy

## 2019-11-23 ENCOUNTER — Other Ambulatory Visit: Payer: Self-pay

## 2019-11-23 ENCOUNTER — Encounter: Payer: Self-pay | Admitting: Physical Therapy

## 2019-11-23 ENCOUNTER — Ambulatory Visit: Payer: 59 | Admitting: Physical Therapy

## 2019-11-23 DIAGNOSIS — R41841 Cognitive communication deficit: Secondary | ICD-10-CM | POA: Diagnosis not present

## 2019-11-23 DIAGNOSIS — R2681 Unsteadiness on feet: Secondary | ICD-10-CM

## 2019-11-23 DIAGNOSIS — M25612 Stiffness of left shoulder, not elsewhere classified: Secondary | ICD-10-CM

## 2019-11-23 DIAGNOSIS — R2689 Other abnormalities of gait and mobility: Secondary | ICD-10-CM

## 2019-11-23 DIAGNOSIS — M6281 Muscle weakness (generalized): Secondary | ICD-10-CM

## 2019-11-23 DIAGNOSIS — R41842 Visuospatial deficit: Secondary | ICD-10-CM
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
## 2019-11-23 NOTE — Patient Instructions (Addendum)
   A 3:00 siesta would be beneficial for your brain - 30 minute nap with not light or noise - add this to your to do  Breathe2relax app  White noise or nature app  Mindfulness apps and on YouTube  [employer] Path App by [employer] app
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
## 2019-11-25 ENCOUNTER — Encounter: Payer: Self-pay | Admitting: Surgical

## 2019-11-25 ENCOUNTER — Ambulatory Visit (INDEPENDENT_AMBULATORY_CARE_PROVIDER_SITE_OTHER): Payer: 59 | Admitting: Surgical

## 2019-11-25 DIAGNOSIS — S0993XA Unspecified injury of face, initial encounter: Secondary | ICD-10-CM

## 2019-11-25 MED ORDER — ONDANSETRON HCL 4 MG PO TABS: 4.0000 mg | ORAL_TABLET | Freq: Three times a day (TID) | ORAL | 0 refills | Status: DC | PRN

## 2019-11-25 MED ORDER — CEPHALEXIN 500 MG PO CAPS: 500.0000 mg | ORAL_CAPSULE | Freq: Four times a day (QID) | ORAL | 0 refills | Status: AC
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
## 2019-11-28 ENCOUNTER — Other Ambulatory Visit: Payer: Self-pay

## 2019-11-28 ENCOUNTER — Encounter: Payer: 59 | Admitting: Plastic Surgery

## 2019-11-28 ENCOUNTER — Encounter: Payer: Self-pay | Admitting: Physical Therapy

## 2019-11-28 ENCOUNTER — Ambulatory Visit: Payer: 59 | Admitting: Physical Therapy

## 2019-11-28 ENCOUNTER — Ambulatory Visit: Payer: 59 | Admitting: Occupational Therapy

## 2019-11-28 ENCOUNTER — Encounter: Payer: No Typology Code available for payment source | Admitting: Psychology

## 2019-11-28 ENCOUNTER — Ambulatory Visit: Payer: 59 | Admitting: Speech Pathology

## 2019-11-28 ENCOUNTER — Encounter: Payer: Self-pay | Admitting: Speech Pathology

## 2019-11-28 DIAGNOSIS — F068 Other specified mental disorders due to known physiological condition: Secondary | ICD-10-CM

## 2019-11-28 DIAGNOSIS — R41841 Cognitive communication deficit: Secondary | ICD-10-CM | POA: Diagnosis not present

## 2019-11-28 DIAGNOSIS — M6281 Muscle weakness (generalized): Secondary | ICD-10-CM

## 2019-11-28 DIAGNOSIS — R2689 Other abnormalities of gait and mobility: Secondary | ICD-10-CM

## 2019-11-28 DIAGNOSIS — S069X0S Unspecified intracranial injury without loss of consciousness, sequela: Secondary | ICD-10-CM

## 2019-11-28 DIAGNOSIS — R2681 Unsteadiness on feet: Secondary | ICD-10-CM
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
## 2019-11-28 NOTE — Patient Instructions (Signed)
   Gary [name], put a pitcher in fridge with 64 oz of tea - have the aids give him at least half while they are hear  Have the aids use the exercise log - put a check for each exercise he completes, so if he has 6 PT exercises, he should ideally have 6 checks  For ST, write down time he worked (20 mins, 15 mins)   Maybe put a large note on the door to bring glasses and notebook

## 2019-11-28 NOTE — Patient Instructions (Addendum)
(  Exercise) Monday Tuesday Wednesday Thursday Friday Saturday Sunday    Counter Exercises: Fwd/back walk Heel/toe walk Walk w/ head turns            Seated exercises: Marching Leg kicks Ankle pumps            Walking            Sit to stand                                                                     Updated MedBridge HEP: (took out seated lower extremity exercises)  Access Code: [email or web address] URL: [email or web address] Date: 11/29/2019 Prepared by: Mady Haagensen  Exercises Backward Walking with Counter Support - 1-2 x daily - 5 x weekly - 1 sets - 3-5 reps Walking with Head Rotation - 1-2 x daily - 5 x weekly - 1 sets - 3-5 reps Tandem Walking with Counter Support - 1-2 x daily - 5 x weekly - 1 sets - 3-5 reps Seated Scapular Retraction - 1 x daily - 5 x weekly - 2-3 sets - 10 reps - 3 sec hold Sit to Stand - 1-2 x daily - 5 x weekly - 2 sets - 5 reps

## 2019-11-29 ENCOUNTER — Other Ambulatory Visit: Payer: Self-pay | Admitting: Physician Assistant

## 2019-11-29 MED ORDER — CALCIUM POLYCARBOPHIL 625 MG PO TABS: 625.0000 mg | ORAL_TABLET | Freq: Every day | ORAL | 0 refills | Status: DC

## 2019-11-29 MED ORDER — RIVAROXABAN 20 MG PO TABS: 20.0000 mg | ORAL_TABLET | Freq: Every day | ORAL | 1 refills | Status: DC

## 2019-11-29 MED ORDER — AMPHETAMINE-DEXTROAMPHET ER 20 MG PO CP24: 40.0000 mg | ORAL_CAPSULE | Freq: Every day | ORAL | 0 refills | Status: DC

## 2019-11-29 MED ORDER — MELATONIN 3 MG PO TABS: 3.0000 mg | ORAL_TABLET | Freq: Every day | ORAL | 0 refills | Status: DC
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
## 2019-11-30 ENCOUNTER — Encounter: Payer: Self-pay | Admitting: Psychology

## 2019-11-30 ENCOUNTER — Other Ambulatory Visit: Payer: Self-pay

## 2019-11-30 ENCOUNTER — Ambulatory Visit: Payer: 59 | Admitting: Physical Therapy

## 2019-11-30 ENCOUNTER — Encounter: Payer: Medicare Other | Admitting: Occupational Therapy

## 2019-11-30 ENCOUNTER — Encounter: Payer: No Typology Code available for payment source | Attending: Psychology | Admitting: Psychology

## 2019-11-30 ENCOUNTER — Encounter: Payer: 59 | Admitting: Plastic Surgery

## 2019-11-30 DIAGNOSIS — S069X0S Unspecified intracranial injury without loss of consciousness, sequela: Secondary | ICD-10-CM | POA: Diagnosis not present

## 2019-11-30 DIAGNOSIS — F068 Other specified mental disorders due to known physiological condition: Secondary | ICD-10-CM | POA: Insufficient documentation
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
## 2019-12-02 ENCOUNTER — Other Ambulatory Visit (HOSPITAL_COMMUNITY): Payer: 59

## 2019-12-05 ENCOUNTER — Encounter: Payer: Medicare Other | Admitting: Speech Pathology

## 2019-12-05 ENCOUNTER — Ambulatory Visit: Payer: Medicare Other | Admitting: Physical Therapy

## 2019-12-05 ENCOUNTER — Encounter: Payer: Medicare Other | Admitting: Occupational Therapy

## 2019-12-07 ENCOUNTER — Ambulatory Visit: Payer: No Typology Code available for payment source | Admitting: Speech Pathology

## 2019-12-07 ENCOUNTER — Ambulatory Visit: Payer: No Typology Code available for payment source | Attending: Physician Assistant | Admitting: Occupational Therapy

## 2019-12-07 ENCOUNTER — Other Ambulatory Visit: Payer: Self-pay

## 2019-12-07 ENCOUNTER — Ambulatory Visit: Payer: No Typology Code available for payment source | Admitting: Physical Therapy

## 2019-12-07 ENCOUNTER — Encounter: Payer: Self-pay | Admitting: Speech Pathology

## 2019-12-07 ENCOUNTER — Encounter: Payer: Self-pay | Admitting: Occupational Therapy

## 2019-12-07 DIAGNOSIS — R2681 Unsteadiness on feet: Secondary | ICD-10-CM | POA: Diagnosis not present

## 2019-12-07 DIAGNOSIS — R41844 Frontal lobe and executive function deficit: Secondary | ICD-10-CM | POA: Diagnosis not present

## 2019-12-07 DIAGNOSIS — R41841 Cognitive communication deficit: Secondary | ICD-10-CM | POA: Insufficient documentation

## 2019-12-07 DIAGNOSIS — M25612 Stiffness of left shoulder, not elsewhere classified: Secondary | ICD-10-CM | POA: Insufficient documentation

## 2019-12-07 DIAGNOSIS — R41840 Attention and concentration deficit: Secondary | ICD-10-CM | POA: Insufficient documentation

## 2019-12-07 DIAGNOSIS — M6281 Muscle weakness (generalized): Secondary | ICD-10-CM

## 2019-12-07 DIAGNOSIS — R262 Difficulty in walking, not elsewhere classified: Secondary | ICD-10-CM | POA: Insufficient documentation

## 2019-12-07 DIAGNOSIS — R41842 Visuospatial deficit: Secondary | ICD-10-CM | POA: Insufficient documentation

## 2019-12-07 DIAGNOSIS — M25512 Pain in left shoulder: Secondary | ICD-10-CM | POA: Diagnosis not present

## 2019-12-07 DIAGNOSIS — R2689 Other abnormalities of gait and mobility: Secondary | ICD-10-CM | POA: Insufficient documentation
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
## 2019-12-07 NOTE — Patient Instructions (Signed)
   Have the aids give time cues and have Gary [name] figure out what his next job is  Add hearing aids to daily schedule next to get dressed
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
## 2019-12-09 ENCOUNTER — Ambulatory Visit: Payer: No Typology Code available for payment source

## 2019-12-09 ENCOUNTER — Telehealth: Payer: Self-pay

## 2019-12-09 ENCOUNTER — Other Ambulatory Visit: Payer: Self-pay

## 2019-12-09 ENCOUNTER — Ambulatory Visit: Payer: No Typology Code available for payment source | Admitting: Physical Therapy

## 2019-12-09 ENCOUNTER — Ambulatory Visit: Payer: No Typology Code available for payment source | Admitting: Occupational Therapy

## 2019-12-09 ENCOUNTER — Encounter: Payer: Self-pay | Admitting: Occupational Therapy

## 2019-12-09 DIAGNOSIS — M6281 Muscle weakness (generalized): Secondary | ICD-10-CM | POA: Diagnosis not present

## 2019-12-09 DIAGNOSIS — R2681 Unsteadiness on feet: Secondary | ICD-10-CM

## 2019-12-09 DIAGNOSIS — R41844 Frontal lobe and executive function deficit: Secondary | ICD-10-CM

## 2019-12-09 DIAGNOSIS — M25612 Stiffness of left shoulder, not elsewhere classified: Secondary | ICD-10-CM

## 2019-12-09 DIAGNOSIS — R41841 Cognitive communication deficit: Secondary | ICD-10-CM

## 2019-12-09 DIAGNOSIS — R41840 Attention and concentration deficit: Secondary | ICD-10-CM

## 2019-12-09 DIAGNOSIS — R2689 Other abnormalities of gait and mobility: Secondary | ICD-10-CM

## 2019-12-09 DIAGNOSIS — R41842 Visuospatial deficit: Secondary | ICD-10-CM

## 2019-12-09 DIAGNOSIS — M25512 Pain in left shoulder: Secondary | ICD-10-CM

## 2019-12-09 NOTE — Patient Instructions (Addendum)
Access Code: [email or web address] URL: [email or web address] Date: 12/09/2019 Prepared by: Mady Haagensen  Exercises Backward Walking with Counter Support - 1-2 x daily - 5 x weekly - 1 sets - 3-5 reps Walking with Head Rotation - 1-2 x daily - 5 x weekly - 1 sets - 3-5 reps Tandem Walking with Counter Support - 1-2 x daily - 5 x weekly - 1 sets - 3-5 reps Seated Scapular Retraction - 1 x daily - 5 x weekly - 2-3 sets - 10 reps - 3 sec hold Sit to Stand - 1-2 x daily - 5 x weekly - 2 sets - 5 reps  Added 12/09/2019 Seated Hamstring Stretch - 1 x daily - 7 x weekly - 3 sets - 10 reps - 30 sec hold Standing Hip Abduction with Counter Support - 1 x daily - 5 x weekly - 3 sets - 10 reps - 3 sec hold Standing Hip Extension with Counter Support - 1 x daily - 5 x weekly - 3 sets - 10 reps - 3 sec hold Standing Marching - 1 x daily - 5 x weekly - 3 sets - 10 reps

## 2019-12-09 NOTE — Telephone Encounter (Signed)
Received call from [name], nurse case manager for patient. She requested that we contact Dr. Alger Simons [phone] to keep him in the loop regarding patient's upcoming surgery.
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
## 2019-12-12 ENCOUNTER — Telehealth: Payer: Self-pay | Admitting: Surgical

## 2019-12-12 ENCOUNTER — Other Ambulatory Visit (HOSPITAL_COMMUNITY)
Admission: RE | Admit: 2019-12-12 | Discharge: 2019-12-12 | Disposition: A | Discharge status: Home / Self Care | Payer: Medicare Other | Source: Ambulatory Visit | Attending: Plastic Surgery | Admitting: Plastic Surgery

## 2019-12-12 DIAGNOSIS — Z20822 Contact with and (suspected) exposure to covid-19: Secondary | ICD-10-CM | POA: Diagnosis not present

## 2019-12-12 DIAGNOSIS — Z01812 Encounter for preprocedural laboratory examination: Secondary | ICD-10-CM | POA: Insufficient documentation

## 2019-12-12 LAB — SARS CORONAVIRUS 2 (TAT 6-24 HRS): SARS Coronavirus 2: NEGATIVE

## 2019-12-12 NOTE — Telephone Encounter (Signed)
Spoke with patient's wife in regards to preoperative protocol for Xarelto.  Reviewed EMR note of Dr. Tessa [name] stating that patient can stop a day or 2 prior to surgery and resume ASAP.  Of note we did previously send a preoperative clearance to patient's PCP Gary Jeans, PA-C, however we did not receive her response.  [name] noted that Dr. Tessa [name] has been managing the Xarelto, but Gary Noble, PA-C was the initial prescriber.  Discussed with patient's wife that patient can hold Xarelto for 2 days prior to surgery and will likely be able to resume postop day 1.  I recommend she call with any questions or concerns.  All of her questions were answered.

## 2019-12-13 ENCOUNTER — Ambulatory Visit: Payer: No Typology Code available for payment source | Admitting: Physical Therapy

## 2019-12-13 ENCOUNTER — Other Ambulatory Visit: Payer: Self-pay

## 2019-12-13 ENCOUNTER — Ambulatory Visit: Payer: No Typology Code available for payment source | Admitting: Occupational Therapy

## 2019-12-13 ENCOUNTER — Telehealth: Payer: Self-pay | Admitting: Plastic Surgery

## 2019-12-13 DIAGNOSIS — R2681 Unsteadiness on feet: Secondary | ICD-10-CM

## 2019-12-13 DIAGNOSIS — M6281 Muscle weakness (generalized): Secondary | ICD-10-CM | POA: Diagnosis not present

## 2019-12-13 NOTE — Telephone Encounter (Signed)
Spoke with patient, discussed the blood thinners. See telephone note
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
## 2019-12-13 NOTE — Telephone Encounter (Signed)
Per Justice Britain at [facility], surgery for 12/14/2019 is approved for workers Therapist, nutritional.

## 2019-12-14 ENCOUNTER — Encounter (HOSPITAL_COMMUNITY): Payer: Self-pay | Admitting: Plastic Surgery

## 2019-12-14 NOTE — Anesthesia Preprocedure Evaluation (Addendum)
Anesthesia Evaluation  Patient identified by MRN, date of birth, ID band Patient awake    Reviewed: Allergy & Precautions, NPO status , Patient's Chart, lab work & pertinent test results  Airway Mallampati: II  TM Distance: >3 FB Neck ROM: Full    Dental no notable dental hx. (+) Teeth Intact, Dental Advisory Given   Pulmonary former smoker,    Pulmonary exam normal breath sounds clear to auscultation       Cardiovascular hypertension, Pt. on medications Normal cardiovascular exam Rhythm:Regular Rate:Normal     Neuro/Psych PSYCHIATRIC DISORDERS    GI/Hepatic Neg liver ROS, GERD  Controlled,  Endo/Other    Renal/GU Renal InsufficiencyRenal disease     Musculoskeletal negative musculoskeletal ROS (+)   Abdominal   Peds  Hematology  (+) anemia ,   Anesthesia Other Findings All: levaquin, shellfish  Reproductive/Obstetrics                            Anesthesia Physical Anesthesia Plan  ASA: III  Anesthesia Plan: General   Post-op Pain Management:    Induction: Intravenous  PONV Risk Score and Plan: 3 and Treatment may vary due to age or medical condition, Ondansetron, Midazolam and Dexamethasone  Airway Management Planned: LMA  Additional Equipment: None  Intra-op Plan:   Post-operative Plan:   Informed Consent: I have reviewed the patients History and Physical, chart, labs and discussed the procedure including the risks, benefits and alternatives for the proposed anesthesia with the patient or authorized representative who has indicated his/her understanding and acceptance.     Dental advisory given  Plan Discussed with: CRNA and Anesthesiologist  Anesthesia Plan Comments:        Anesthesia Quick Evaluation

## 2019-12-14 NOTE — Progress Notes (Signed)
PCP:  Dr. Alger Simons Cardiologist:  Denies  EKG:  08/07/19 CXR:  08/08/19 ECHO:  Denies Stress Test: Denies Cardiac Cath:  Denies  Fasting Blood Sugar-  N/A Checks Blood Sugar__N/A_ times a day  ASA:  No Blood Thinners:  Xarelto last dose 12/12/19.  Used for DVT.  Covid 12/6 negative  Anesthesia Review:  No  Patient denies shortness of breath, fever, cough, and chest pain at PAT appointment.  Patient verbalized understanding of instructions provided today at the PAT appointment.  Patient asked to review instructions at home and day of surgery.

## 2019-12-15 ENCOUNTER — Ambulatory Visit (HOSPITAL_COMMUNITY): Payer: No Typology Code available for payment source | Admitting: Certified Registered Nurse Anesthetist

## 2019-12-15 ENCOUNTER — Encounter (HOSPITAL_COMMUNITY)
Admission: RE | Disposition: A | Discharge status: Home / Self Care | Payer: Self-pay | Source: Home / Self Care | Attending: Plastic Surgery

## 2019-12-15 ENCOUNTER — Ambulatory Visit: Payer: 59 | Admitting: Physical Therapy

## 2019-12-15 ENCOUNTER — Encounter: Payer: 59 | Admitting: Occupational Therapy

## 2019-12-15 ENCOUNTER — Encounter (HOSPITAL_COMMUNITY): Payer: Self-pay | Admitting: Plastic Surgery

## 2019-12-15 ENCOUNTER — Ambulatory Visit (HOSPITAL_COMMUNITY)
Admission: RE | Admit: 2019-12-15 | Discharge: 2019-12-15 | Disposition: A | Discharge status: Home / Self Care | Payer: No Typology Code available for payment source | Attending: Plastic Surgery | Admitting: Plastic Surgery

## 2019-12-15 DIAGNOSIS — Z7901 Long term (current) use of anticoagulants: Secondary | ICD-10-CM | POA: Diagnosis not present

## 2019-12-15 DIAGNOSIS — X58XXXD Exposure to other specified factors, subsequent encounter: Secondary | ICD-10-CM | POA: Insufficient documentation

## 2019-12-15 DIAGNOSIS — S0240CD Maxillary fracture, right side, subsequent encounter for fracture with routine healing: Secondary | ICD-10-CM | POA: Diagnosis not present

## 2019-12-15 DIAGNOSIS — Z79899 Other long term (current) drug therapy: Secondary | ICD-10-CM | POA: Diagnosis not present

## 2019-12-15 DIAGNOSIS — Z87891 Personal history of nicotine dependence: Secondary | ICD-10-CM | POA: Insufficient documentation

## 2019-12-15 DIAGNOSIS — Z881 Allergy status to other antibiotic agents status: Secondary | ICD-10-CM | POA: Insufficient documentation

## 2019-12-15 DIAGNOSIS — S0231XD Fracture of orbital floor, right side, subsequent encounter for fracture with routine healing: Secondary | ICD-10-CM

## 2019-12-15 DIAGNOSIS — Z86718 Personal history of other venous thrombosis and embolism: Secondary | ICD-10-CM | POA: Diagnosis not present

## 2019-12-15 DIAGNOSIS — S0240ED Zygomatic fracture, right side, subsequent encounter for fracture with routine healing: Secondary | ICD-10-CM | POA: Insufficient documentation

## 2019-12-15 DIAGNOSIS — Z472 Encounter for removal of internal fixation device: Secondary | ICD-10-CM

## 2019-12-15 HISTORY — PX: MINOR REMOVAL OF MANDIBULAR HARDWARE: SHX6427

## 2019-12-15 LAB — CBC
HCT: 40.3 % (ref 39.0–52.0)
Hemoglobin: 13.7 g/dL (ref 13.0–17.0)
MCH: 30 pg (ref 26.0–34.0)
MCHC: 34 g/dL (ref 30.0–36.0)
MCV: 88.4 fL (ref 80.0–100.0)
Platelets: 349 10*3/uL (ref 150–400)
RBC: 4.56 MIL/uL (ref 4.22–5.81)
RDW: 14.3 % (ref 11.5–15.5)
WBC: 3.9 10*3/uL — ABNORMAL LOW (ref 4.0–10.5)
nRBC: 0 % (ref 0.0–0.2)

## 2019-12-15 LAB — BASIC METABOLIC PANEL
Anion gap: 12 (ref 5–15)
BUN: 19 mg/dL (ref 8–23)
CO2: 19 mmol/L — ABNORMAL LOW (ref 22–32)
Calcium: 9.2 mg/dL (ref 8.9–10.3)
Chloride: 111 mmol/L (ref 98–111)
Creatinine, Ser: 1.78 mg/dL — ABNORMAL HIGH (ref 0.61–1.24)
GFR, Estimated: 40 mL/min — ABNORMAL LOW (ref >60)
Glucose, Bld: 90 mg/dL (ref 70–99)
Potassium: 3.6 mmol/L (ref 3.5–5.1)
Sodium: 142 mmol/L (ref 135–145)

## 2019-12-15 SURGERY — MINOR REMOVAL OF MANDIBULAR HARDWARE
Anesthesia: General | Site: Mouth

## 2019-12-15 MED ORDER — BSS IO SOLN
INTRAOCULAR | Status: AC
  Filled 2019-12-15: qty 15

## 2019-12-15 MED ORDER — OXYCODONE HCL 5 MG PO TABS
ORAL_TABLET | ORAL | Status: AC
  Filled 2019-12-15: qty 1

## 2019-12-15 MED ORDER — CEFAZOLIN SODIUM-DEXTROSE 2-4 GM/100ML-% IV SOLN
2.0000 g | INTRAVENOUS | Status: AC
  Administered 2019-12-15: 2 g via INTRAVENOUS
  Filled 2019-12-15: qty 100

## 2019-12-15 MED ORDER — SODIUM CHLORIDE 0.9 % IV SOLN: 250.0000 mL | INTRAVENOUS | Status: DC | PRN

## 2019-12-15 MED ORDER — OXYCODONE HCL 5 MG PO TABS
5.0000 mg | ORAL_TABLET | ORAL | Status: DC | PRN
  Administered 2019-12-15: 5 mg via ORAL

## 2019-12-15 MED ORDER — CHLORHEXIDINE GLUCONATE 0.12 % MT SOLN
15.0000 mL | Freq: Once | OROMUCOSAL | Status: AC
  Administered 2019-12-15: 15 mL via OROMUCOSAL
  Filled 2019-12-15: qty 15

## 2019-12-15 MED ORDER — 0.9 % SODIUM CHLORIDE (POUR BTL) OPTIME
TOPICAL | Status: DC | PRN
  Administered 2019-12-15: 1000 mL

## 2019-12-15 MED ORDER — DEXAMETHASONE SODIUM PHOSPHATE 4 MG/ML IJ SOLN
INTRAMUSCULAR | Status: DC | PRN
  Administered 2019-12-15: 5 mg via INTRAVENOUS

## 2019-12-15 MED ORDER — ROCURONIUM BROMIDE 10 MG/ML (PF) SYRINGE
PREFILLED_SYRINGE | INTRAVENOUS | Status: AC
  Filled 2019-12-15: qty 10

## 2019-12-15 MED ORDER — PROPOFOL 10 MG/ML IV BOLUS
INTRAVENOUS | Status: DC | PRN
  Administered 2019-12-15: 150 mg via INTRAVENOUS
  Administered 2019-12-15: 20 mg via INTRAVENOUS

## 2019-12-15 MED ORDER — CHLORHEXIDINE GLUCONATE CLOTH 2 % EX PADS: 6.0000 | MEDICATED_PAD | Freq: Once | CUTANEOUS | Status: DC

## 2019-12-15 MED ORDER — ACETAMINOPHEN 10 MG/ML IV SOLN: 1000.0000 mg | Freq: Once | INTRAVENOUS | Status: DC | PRN

## 2019-12-15 MED ORDER — LIDOCAINE-EPINEPHRINE 1 %-1:100000 IJ SOLN
INTRAMUSCULAR | Status: DC | PRN
  Administered 2019-12-15: 1 mL

## 2019-12-15 MED ORDER — BSS IO SOLN
INTRAOCULAR | Status: DC | PRN
  Administered 2019-12-15: 1

## 2019-12-15 MED ORDER — SODIUM CHLORIDE 0.9% FLUSH: 3.0000 mL | INTRAVENOUS | Status: DC | PRN

## 2019-12-15 MED ORDER — ACETAMINOPHEN 325 MG PO TABS: 650.0000 mg | ORAL_TABLET | ORAL | Status: DC | PRN

## 2019-12-15 MED ORDER — ACETAMINOPHEN 650 MG RE SUPP: 650.0000 mg | RECTAL | Status: DC | PRN

## 2019-12-15 MED ORDER — FENTANYL CITRATE (PF) 100 MCG/2ML IJ SOLN: 25.0000 ug | INTRAMUSCULAR | Status: DC | PRN

## 2019-12-15 MED ORDER — TETRACAINE HCL 0.5 % OP SOLN
1.0000 [drp] | Freq: Once | OPHTHALMIC | Status: DC
  Filled 2019-12-15: qty 4

## 2019-12-15 MED ORDER — PROPOFOL 10 MG/ML IV BOLUS
INTRAVENOUS | Status: AC
  Filled 2019-12-15: qty 20

## 2019-12-15 MED ORDER — ONDANSETRON HCL 4 MG/2ML IJ SOLN
INTRAMUSCULAR | Status: DC | PRN
  Administered 2019-12-15: 4 mg via INTRAVENOUS

## 2019-12-15 MED ORDER — FENTANYL CITRATE (PF) 250 MCG/5ML IJ SOLN
INTRAMUSCULAR | Status: AC
  Filled 2019-12-15: qty 5

## 2019-12-15 MED ORDER — ORAL CARE MOUTH RINSE: 15.0000 mL | Freq: Once | OROMUCOSAL | Status: AC

## 2019-12-15 MED ORDER — SODIUM CHLORIDE 0.9% FLUSH: 3.0000 mL | Freq: Two times a day (BID) | INTRAVENOUS | Status: DC

## 2019-12-15 MED ORDER — LACTATED RINGERS IV SOLN: INTRAVENOUS | Status: DC

## 2019-12-15 MED ORDER — FENTANYL CITRATE (PF) 100 MCG/2ML IJ SOLN
INTRAMUSCULAR | Status: DC | PRN
  Administered 2019-12-15: 50 ug via INTRAVENOUS

## 2019-12-15 MED ORDER — ERYTHROMYCIN 5 MG/GM OP OINT
TOPICAL_OINTMENT | Freq: Two times a day (BID) | OPHTHALMIC | Status: DC
  Administered 2019-12-15: 1 via OPHTHALMIC
  Filled 2019-12-15: qty 3.5

## 2019-12-15 MED ORDER — LIDOCAINE-EPINEPHRINE 1 %-1:100000 IJ SOLN
INTRAMUSCULAR | Status: AC
  Filled 2019-12-15: qty 1

## 2019-12-15 MED ORDER — LIDOCAINE HCL (CARDIAC) PF 100 MG/5ML IV SOSY
PREFILLED_SYRINGE | INTRAVENOUS | Status: DC | PRN
  Administered 2019-12-15: 100 mg via INTRAVENOUS

## 2019-12-15 MED ORDER — ONDANSETRON HCL 4 MG/2ML IJ SOLN: 4.0000 mg | Freq: Once | INTRAMUSCULAR | Status: DC | PRN

## 2019-12-15 SURGICAL SUPPLY — 36 items
BLADE SURG 15 STRL LF DISP TIS (BLADE) ×1 IMPLANT
BLADE SURG 15 STRL SS (BLADE) ×1
CANISTER SUCT 3000ML PPV (MISCELLANEOUS) ×2 IMPLANT
COVER SURGICAL LIGHT HANDLE (MISCELLANEOUS) ×2 IMPLANT
COVER WAND RF STERILE (DRAPES) ×2 IMPLANT
DECANTER SPIKE VIAL GLASS SM (MISCELLANEOUS) ×2 IMPLANT
DERMABOND ADVANCED (GAUZE/BANDAGES/DRESSINGS) ×1
DERMABOND ADVANCED .7 DNX12 (GAUZE/BANDAGES/DRESSINGS) ×1 IMPLANT
DRAPE BACK TABLE (DRAPES) ×2 IMPLANT
DRAPE HALF SHEET 40X57 (DRAPES) ×2 IMPLANT
ELECT COATED BLADE 2.86 ST (ELECTRODE) IMPLANT
ELECT REM PT RETURN 9FT ADLT (ELECTROSURGICAL)
ELECTRODE REM PT RTRN 9FT ADLT (ELECTROSURGICAL) IMPLANT
GAUZE 4X4 16PLY RFD (DISPOSABLE) ×2 IMPLANT
GLOVE BIO SURGEON STRL SZ 6.5 (GLOVE) ×2 IMPLANT
GOWN STRL REUS W/ TWL LRG LVL3 (GOWN DISPOSABLE) ×2 IMPLANT
GOWN STRL REUS W/TWL LRG LVL3 (GOWN DISPOSABLE) ×2
KIT BASIN OR (CUSTOM PROCEDURE TRAY) ×2 IMPLANT
KIT TURNOVER KIT B (KITS) ×2 IMPLANT
NEEDLE PRECISIONGLIDE 27X1.5 (NEEDLE) ×2 IMPLANT
NS IRRIG 1000ML POUR BTL (IV SOLUTION) ×2 IMPLANT
PAD ARMBOARD 7.5X6 YLW CONV (MISCELLANEOUS) ×4 IMPLANT
PENCIL BUTTON HOLSTER BLD 10FT (ELECTRODE) IMPLANT
STRIP CLOSURE SKIN 1/2X4 (GAUZE/BANDAGES/DRESSINGS) ×2 IMPLANT
SUCTION FRAZIER HANDLE 10FR (MISCELLANEOUS)
SUCTION TUBE FRAZIER 10FR DISP (MISCELLANEOUS) IMPLANT
SUT CHROMIC 3 0 PS 2 (SUTURE) ×2 IMPLANT
SUT CHROMIC 4 0 PS 2 18 (SUTURE) ×2 IMPLANT
SUT MON AB 5-0 PS2 18 (SUTURE) ×2 IMPLANT
SUT VICRYL 4-0 PS2 18IN ABS (SUTURE) ×2 IMPLANT
SYR CONTROL 10ML LL (SYRINGE) ×2 IMPLANT
TOWEL GREEN STERILE (TOWEL DISPOSABLE) ×2 IMPLANT
TOWEL GREEN STERILE FF (TOWEL DISPOSABLE) ×2 IMPLANT
TUBE CONNECTING 12X1/4 (SUCTIONS) ×2 IMPLANT
WATER STERILE IRR 1000ML POUR (IV SOLUTION) IMPLANT
YANKAUER SUCT BULB TIP NO VENT (SUCTIONS) ×2 IMPLANT

## 2019-12-15 NOTE — Transfer of Care (Signed)
Immediate Anesthesia Transfer of Care Note  Patient: Gary [name]  Procedure(s) Performed: REMOVAL OF RIGHT LATERAL ORBITAL MINIPLATE (N/A Mouth)  Patient Location: PACU  Anesthesia Type:General  Level of Consciousness: sedated  Airway & Oxygen Therapy: Patient connected to face mask oxygen  Post-op Assessment: Report given to RN and Post -op Vital signs reviewed and stable  Post vital signs: Reviewed and stable  Last Vitals:  Vitals Value Taken Time  BP 137/89 12/15/19 0815  Temp    Pulse 77 12/15/19 0816  Resp 11 12/15/19 0816  SpO2 100 % 12/15/19 0816  Vitals shown include unvalidated device data.  Last Pain:  Vitals:   12/15/19 0639  TempSrc:   PainSc: 4       Patients Stated Pain Goal: 4 (73/71/06 2694)  Complications: No complications documented.

## 2019-12-15 NOTE — Anesthesia Procedure Notes (Signed)
Procedure Name: LMA Insertion Performed by: Valda Favia, CRNA Pre-anesthesia Checklist: Patient identified, Emergency Drugs available, Suction available and Patient being monitored Patient Re-evaluated:Patient Re-evaluated prior to induction Oxygen Delivery Method: Circle System Utilized Preoxygenation: Pre-oxygenation with 100% oxygen Induction Type: IV induction Ventilation: Mask ventilation without difficulty LMA: LMA inserted LMA Size: 4.0 Number of attempts: 1 Airway Equipment and Method: Bite block Placement Confirmation: positive ETCO2 Tube secured with: Tape Dental Injury: Teeth and Oropharynx as per pre-operative assessment  Comments: Gary [name], [place]

## 2019-12-15 NOTE — Op Note (Addendum)
DATE OF OPERATION: 12/15/2019  LOCATION: [facility] Outpatient   PREOPERATIVE DIAGNOSIS: right orbital wall hardware from fracture  POSTOPERATIVE DIAGNOSIS: Same  PROCEDURE: Removal of right orbital hardware from fracture repair  SURGEON: Lyndee Leo Sanger Shatana Saxton, DO  ASSISTANT: Roetta Sessions, PA  EBL: 2 cc  CONDITION: Stable  COMPLICATIONS: None  INDICATION: The patient, Gary [name], is a 74 y.o. male born on 1945/06/04, is here for treatment of prominent orbital hardware after placement for ORIF of orbital fracture.Marland Kitchen   PROCEDURE DETAILS:  The patient was seen prior to surgery and marked.  The IV antibiotics were given. The patient was taken to the operating room and given a general anesthetic. A standard time out was performed and all information was confirmed by those in the room. SCDs were placed.   The patient was prepped and draped.  Local with epinephrine was injected at the previous incision site.  After waiting several minutes for the local to take effect the #15 blade was used to make an incision.  The knife and Bovie was used to dissect down to the miniplate.  There were 3 screws and the miniplate were removed.  The deep fascia was closed with 5-0 Vicryl.  Skin was closed with a running subcuticular 5-0 Monocryl.  Dermabond and Steri-Strips were applied. The patient was allowed to wake up and taken to recovery room in stable condition at the end of the case. The family was notified at the end of the case.   The advanced practice practitioner (APP) assisted throughout the case.  The APP was essential in retraction and counter traction when needed to make the case progress smoothly.  This retraction and assistance made it possible to see the tissue plans for the procedure.  The assistance was needed for blood control, tissue re-approximation and assisted with closure of the incision site.

## 2019-12-15 NOTE — Progress Notes (Signed)
Patient is a 74 year old male who underwent removal of orbital wall hardware today with Dr. Marla Roe. Patient was noted to have scleral irritation to his right eye postoperatively from CHG prep. BSS was used to irrigate the eye, patient was then ordered erythromycin ointment and tetracaine 0.5% ophthalmic solution. After irrigation and application of tetracaine patient reported some improvement, but was still having some pain.  Offered patient ophthalmic consultation while in the PACU prior to discharge. Patient declined and wanted to go home.  Recommended he cover the eye with an eye patch and apply erythromycin ointment BID x 2 days. Recommend patient call if symptoms worsen or change.   I am planning to call patient this evening to check in.

## 2019-12-15 NOTE — Anesthesia Postprocedure Evaluation (Signed)
Anesthesia Post Note  Patient: Gary [name]  Procedure(s) Performed: REMOVAL OF RIGHT LATERAL ORBITAL MINIPLATE (N/A Mouth)     Patient location during evaluation: PACU Anesthesia Type: General Level of consciousness: awake and alert Pain management: pain level controlled Vital Signs Assessment: post-procedure vital signs reviewed and stable Respiratory status: spontaneous breathing, nonlabored ventilation, respiratory function stable and patient connected to nasal cannula oxygen Cardiovascular status: blood pressure returned to baseline and stable Postop Assessment: no apparent nausea or vomiting Anesthetic complications: no   No complications documented.  Last Vitals:  Vitals:   12/15/19 1030 12/15/19 1045  BP: (!) 172/89 (!) 153/73  Pulse: 89 81  Resp: 17 (!) 21  Temp:  (!) 36.2 C  SpO2: 98% 100%    Last Pain:  Vitals:   12/15/19 1015  TempSrc:   PainSc: 8                  Barnet Glasgow
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
## 2019-12-15 NOTE — Discharge Instructions (Addendum)
INSTRUCTIONS FOR AFTER SURGERY   You will likely have some questions about what to expect following your operation.  The following information will help you and your family understand what to expect when you are discharged from the hospital.  Following these guidelines will help ensure a smooth recovery and reduce risks of complications.  Postoperative instructions include information on: diet, wound care, medications and physical activity.  AFTER SURGERY Expect to go home after the procedure.  In some cases, you may need to spend one night in the hospital for observation.  DIET This surgery does not require a specific diet.  However, I have to mention that the healthier you eat the better your body can start healing. It is important to increasing your protein intake.  This means limiting the foods with added sugar.  Focus on fruits and vegetables and some meat. It is very important to drink water after your surgery.  If your urine is bright yellow, then it is concentrated, and you need to drink more water.  As a general rule after surgery, you should have 8 ounces of water every hour while awake.  If you find you are persistently nauseated or unable to take in liquids let us know.  NO TOBACCO USE or EXPOSURE.  This will slow your healing process and increase the risk of a wound.  WOUND CARE You can shower the day after surgery.  Use fragrance free soap.  Dial, [place], [place] and Cetaphil are usually mild on the skin.  If you have steri-strips / tape directly attached to your skin leave them in place. It is OK to get these wet.  No baths, pools or hot tubs for two weeks. We close your incision to leave the smallest and best-looking scar. No ointment or creams on your incisions until given the go ahead.  Especially not Neosporin (Too many skin reactions with this one).  A few weeks after surgery you can use Mederma and start massaging the scar.  ACTIVITY No heavy lifting until cleared by the doctor.  It  is OK to walk and climb stairs. In fact, moving your legs is very important to decrease your risk of a blood clot.  It will also help keep you from getting deconditioned.  Every 1 to 2 hours get up and walk for 5 minutes. This will help with a quicker recovery back to normal.  Let pain be your guide so you don't do too much.    WORK Everyone returns to work at different times. As a rough guide, most people take at least 1 - 2 weeks off prior to returning to work. If you need documentation for your job, bring the forms to your postoperative follow up visit.  DRIVING Arrange for someone to bring you home from the hospital.  You may be able to drive a few days after surgery but not while taking any narcotics or valium.  BOWEL MOVEMENTS Constipation can occur after anesthesia and while taking pain medication.  It is important to stay ahead for your comfort.  We recommend taking Milk of Magnesia (2 tablespoons; twice a day) while taking the pain pills.  SEROMA This is fluid your body tried to put in the surgical site.  This is normal but if it creates excessive pain and swelling let us know.  It usually decreases in a few weeks.  MEDICATIONS and PAIN CONTROL At your preoperative visit for you history and physical you were given the following medications: 1. An antibiotic: Start  this medication when you get home and take according to the instructions on the bottle. 2. Zofran 4 mg:  This is to treat nausea and vomiting.  You can take this every 6 hours as needed and only if needed. 3. Norco (hydrocodone/acetaminophen) 5/325 mg:  This is only to be used after you have taken the motrin or the tylenol. Every 8 hours as needed. Over the counter Medication to take: 4. Ibuprofen (Motrin) 600 mg:  Take this every 6 hours.  If you have additional pain then take 500 mg of the tylenol.  Only take the Norco after you have tried these two. 5. Miralax or stool softener of choice: Take this according to the bottle  if you take the [place] Call your surgeon's office if any of the following occur: . Fever 101 degrees F or greater . Excessive bleeding or fluid from the incision site. . Pain that increases over time without aid from the medications . Redness, warmth, or pus draining from incision sites . Persistent nausea or inability to take in liquids . Severe misshapen area that underwent the operation.

## 2019-12-16 ENCOUNTER — Ambulatory Visit: Payer: No Typology Code available for payment source | Admitting: Physical Therapy

## 2019-12-19 ENCOUNTER — Encounter (HOSPITAL_COMMUNITY): Payer: Self-pay | Admitting: Plastic Surgery

## 2019-12-19 ENCOUNTER — Ambulatory Visit: Payer: 59 | Admitting: Physical Therapy

## 2019-12-19 ENCOUNTER — Ambulatory Visit: Payer: No Typology Code available for payment source | Admitting: Occupational Therapy

## 2019-12-19 ENCOUNTER — Telehealth: Payer: Self-pay | Admitting: *Deleted

## 2019-12-19 ENCOUNTER — Ambulatory Visit: Payer: No Typology Code available for payment source | Admitting: Physical Therapy

## 2019-12-19 NOTE — Telephone Encounter (Signed)
He should stay active as much as possible. Range the knee as much as he can tolerate. Apply moist heat 3-4 x daily for 20-30 minute intervals

## 2019-12-19 NOTE — Telephone Encounter (Signed)
Mrs Bevens called about new pain behind knee in Gary [name]'s leg that had the blood clot in it.  She is asking what they should do and if it is ok for him to go to PT today.  Please advise.

## 2019-12-19 NOTE — Telephone Encounter (Signed)
Notified. 

## 2019-12-20 ENCOUNTER — Encounter: Payer: Self-pay | Admitting: Plastic Surgery

## 2019-12-20 ENCOUNTER — Other Ambulatory Visit: Payer: Self-pay

## 2019-12-20 ENCOUNTER — Ambulatory Visit (INDEPENDENT_AMBULATORY_CARE_PROVIDER_SITE_OTHER): Payer: No Typology Code available for payment source | Admitting: Plastic Surgery

## 2019-12-20 VITALS — BP 144/79 | Temp 97.9°F

## 2019-12-20 DIAGNOSIS — S0993XA Unspecified injury of face, initial encounter: Secondary | ICD-10-CM
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
## 2019-12-22 ENCOUNTER — Ambulatory Visit: Payer: No Typology Code available for payment source | Admitting: Physical Therapy

## 2019-12-22 ENCOUNTER — Ambulatory Visit: Payer: No Typology Code available for payment source | Admitting: Occupational Therapy

## 2019-12-23 ENCOUNTER — Encounter: Payer: No Typology Code available for payment source | Attending: Psychology | Admitting: Psychology

## 2019-12-23 ENCOUNTER — Encounter: Payer: Self-pay | Admitting: Psychology

## 2019-12-23 ENCOUNTER — Other Ambulatory Visit: Payer: Self-pay

## 2019-12-23 DIAGNOSIS — F068 Other specified mental disorders due to known physiological condition: Secondary | ICD-10-CM | POA: Diagnosis present

## 2019-12-23 DIAGNOSIS — F4323 Adjustment disorder with mixed anxiety and depressed mood: Secondary | ICD-10-CM | POA: Diagnosis present

## 2019-12-23 DIAGNOSIS — N183 Chronic kidney disease, stage 3 unspecified: Secondary | ICD-10-CM | POA: Insufficient documentation

## 2019-12-23 DIAGNOSIS — F0280 Dementia in other diseases classified elsewhere without behavioral disturbance: Secondary | ICD-10-CM | POA: Insufficient documentation

## 2019-12-23 DIAGNOSIS — G44329 Chronic post-traumatic headache, not intractable: Secondary | ICD-10-CM | POA: Insufficient documentation

## 2019-12-23 DIAGNOSIS — S069X9S Unspecified intracranial injury with loss of consciousness of unspecified duration, sequela: Secondary | ICD-10-CM | POA: Insufficient documentation

## 2019-12-23 DIAGNOSIS — S069X0S Unspecified intracranial injury without loss of consciousness, sequela: Secondary | ICD-10-CM | POA: Insufficient documentation

## 2019-12-23 DIAGNOSIS — F900 Attention-deficit hyperactivity disorder, predominantly inattentive type: Secondary | ICD-10-CM | POA: Insufficient documentation

## 2019-12-23 NOTE — Progress Notes (Signed)
   Neuropsychology Note  Gary [name] completed 240 minutes of neuropsychological testing with this provider. The patient did not appear overtly distressed by the testing session, per behavioral observation or via self-report. Rest breaks were offered.   Clinical Decision Making: In considering the patient's current level of functioning, level of presumed impairment, nature of symptoms, emotional and behavioral responses during the interview, level of literacy, and observed level of motivation/effort, a battery of tests was selected and completed by patient. Changes were made as deemed necessary based on patient performance on testing, behavioral observations and additional pertinent factors such as those listed above.  Tests Administered:  Animal Naming   [name] (BNT)  Controlled Oral Word Association Test (COWAT)  [employer] Executive Function System (D-KEFS), Trail Making Test  Grooved Pegboard   RBANS Line Orientation   Wechsler Adult Intelligence Scale, 4th Edition (WAIS-IV)  Wechsler Memory Scale, 4th Edition  WMS-IV (Older Adult Battery)  Results: To be included once scoring is complete.  Gary [name] will return for an interactive feedback session with this provider on 01/05/20 at which time his test performances, clinical impressions and treatment recommendations will be reviewed in detail. The patient understands he can contact our office should he require our assistance before this time.  Full report to follow.

## 2019-12-26 ENCOUNTER — Encounter: Payer: Self-pay | Admitting: Physical Therapy

## 2019-12-26 ENCOUNTER — Other Ambulatory Visit: Payer: Self-pay

## 2019-12-26 ENCOUNTER — Ambulatory Visit: Payer: No Typology Code available for payment source | Attending: Physician Assistant | Admitting: Occupational Therapy

## 2019-12-26 ENCOUNTER — Ambulatory Visit: Payer: No Typology Code available for payment source | Admitting: Physical Therapy

## 2019-12-26 DIAGNOSIS — R2689 Other abnormalities of gait and mobility: Secondary | ICD-10-CM | POA: Diagnosis present

## 2019-12-26 DIAGNOSIS — R41840 Attention and concentration deficit: Secondary | ICD-10-CM | POA: Insufficient documentation

## 2019-12-26 DIAGNOSIS — R2681 Unsteadiness on feet: Secondary | ICD-10-CM

## 2019-12-26 DIAGNOSIS — M6281 Muscle weakness (generalized): Secondary | ICD-10-CM

## 2019-12-26 DIAGNOSIS — R41844 Frontal lobe and executive function deficit: Secondary | ICD-10-CM | POA: Diagnosis present

## 2019-12-26 DIAGNOSIS — R41842 Visuospatial deficit: Secondary | ICD-10-CM | POA: Diagnosis present

## 2019-12-26 DIAGNOSIS — M25612 Stiffness of left shoulder, not elsewhere classified: Secondary | ICD-10-CM | POA: Insufficient documentation

## 2019-12-26 DIAGNOSIS — M25512 Pain in left shoulder: Secondary | ICD-10-CM | POA: Diagnosis present
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
## 2019-12-28 ENCOUNTER — Ambulatory Visit: Payer: No Typology Code available for payment source | Admitting: Physical Therapy

## 2019-12-28 ENCOUNTER — Encounter: Payer: Self-pay | Admitting: Occupational Therapy

## 2019-12-28 ENCOUNTER — Ambulatory Visit: Payer: No Typology Code available for payment source | Admitting: Occupational Therapy

## 2019-12-28 ENCOUNTER — Other Ambulatory Visit: Payer: Self-pay

## 2019-12-28 ENCOUNTER — Encounter: Payer: Self-pay | Admitting: Physical Therapy

## 2019-12-28 DIAGNOSIS — R2689 Other abnormalities of gait and mobility: Secondary | ICD-10-CM

## 2019-12-28 DIAGNOSIS — M25612 Stiffness of left shoulder, not elsewhere classified: Secondary | ICD-10-CM

## 2019-12-28 DIAGNOSIS — M6281 Muscle weakness (generalized): Secondary | ICD-10-CM

## 2019-12-28 DIAGNOSIS — R2681 Unsteadiness on feet: Secondary | ICD-10-CM

## 2019-12-28 DIAGNOSIS — R41840 Attention and concentration deficit: Secondary | ICD-10-CM

## 2019-12-28 DIAGNOSIS — R41844 Frontal lobe and executive function deficit: Secondary | ICD-10-CM

## 2019-12-28 DIAGNOSIS — M25512 Pain in left shoulder: Secondary | ICD-10-CM

## 2019-12-28 DIAGNOSIS — R41842 Visuospatial deficit: Secondary | ICD-10-CM
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
## 2019-12-29 ENCOUNTER — Encounter: Payer: 59 | Admitting: Occupational Therapy

## 2020-01-02 ENCOUNTER — Ambulatory Visit: Payer: No Typology Code available for payment source | Admitting: Speech Pathology

## 2020-01-02 ENCOUNTER — Ambulatory Visit: Payer: No Typology Code available for payment source | Admitting: Occupational Therapy

## 2020-01-03 ENCOUNTER — Other Ambulatory Visit: Payer: Self-pay

## 2020-01-03 ENCOUNTER — Encounter: Payer: No Typology Code available for payment source | Attending: Psychology | Admitting: Psychology

## 2020-01-03 ENCOUNTER — Encounter: Payer: 59 | Admitting: Occupational Therapy

## 2020-01-03 DIAGNOSIS — S069X9S Unspecified intracranial injury with loss of consciousness of unspecified duration, sequela: Secondary | ICD-10-CM | POA: Insufficient documentation

## 2020-01-03 DIAGNOSIS — N183 Chronic kidney disease, stage 3 unspecified: Secondary | ICD-10-CM | POA: Insufficient documentation

## 2020-01-03 DIAGNOSIS — G44329 Chronic post-traumatic headache, not intractable: Secondary | ICD-10-CM | POA: Insufficient documentation

## 2020-01-03 DIAGNOSIS — IMO0001 Reserved for inherently not codable concepts without codable children: Secondary | ICD-10-CM

## 2020-01-03 DIAGNOSIS — F4323 Adjustment disorder with mixed anxiety and depressed mood: Secondary | ICD-10-CM | POA: Diagnosis present

## 2020-01-03 DIAGNOSIS — F0280 Dementia in other diseases classified elsewhere without behavioral disturbance: Secondary | ICD-10-CM | POA: Diagnosis present

## 2020-01-03 DIAGNOSIS — F900 Attention-deficit hyperactivity disorder, predominantly inattentive type: Secondary | ICD-10-CM | POA: Diagnosis not present

## 2020-01-04 ENCOUNTER — Ambulatory Visit: Payer: No Typology Code available for payment source | Admitting: Physical Therapy

## 2020-01-04 ENCOUNTER — Other Ambulatory Visit: Payer: Self-pay

## 2020-01-04 ENCOUNTER — Encounter: Payer: Self-pay | Admitting: Speech Pathology

## 2020-01-04 ENCOUNTER — Encounter: Payer: Self-pay | Admitting: Occupational Therapy

## 2020-01-04 ENCOUNTER — Ambulatory Visit: Payer: No Typology Code available for payment source | Admitting: Occupational Therapy

## 2020-01-04 ENCOUNTER — Ambulatory Visit
Payer: No Typology Code available for payment source | Attending: Physical Medicine & Rehabilitation | Admitting: Speech Pathology

## 2020-01-04 ENCOUNTER — Encounter: Payer: Self-pay | Admitting: Physical Therapy

## 2020-01-04 DIAGNOSIS — R2689 Other abnormalities of gait and mobility: Secondary | ICD-10-CM

## 2020-01-04 DIAGNOSIS — M6281 Muscle weakness (generalized): Secondary | ICD-10-CM

## 2020-01-04 DIAGNOSIS — R41844 Frontal lobe and executive function deficit: Secondary | ICD-10-CM

## 2020-01-04 DIAGNOSIS — F068 Other specified mental disorders due to known physiological condition: Secondary | ICD-10-CM

## 2020-01-04 DIAGNOSIS — M25512 Pain in left shoulder: Secondary | ICD-10-CM | POA: Insufficient documentation

## 2020-01-04 DIAGNOSIS — S069X0S Unspecified intracranial injury without loss of consciousness, sequela: Secondary | ICD-10-CM

## 2020-01-04 DIAGNOSIS — R2681 Unsteadiness on feet: Secondary | ICD-10-CM | POA: Diagnosis present

## 2020-01-04 DIAGNOSIS — M25612 Stiffness of left shoulder, not elsewhere classified: Secondary | ICD-10-CM | POA: Diagnosis present

## 2020-01-04 DIAGNOSIS — R41840 Attention and concentration deficit: Secondary | ICD-10-CM | POA: Insufficient documentation

## 2020-01-04 DIAGNOSIS — R41842 Visuospatial deficit: Secondary | ICD-10-CM

## 2020-01-04 DIAGNOSIS — R41841 Cognitive communication deficit: Secondary | ICD-10-CM | POA: Insufficient documentation
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
## 2020-01-04 NOTE — Patient Instructions (Signed)
    Set timer for 30 minutes a time on the tablet, ideally 3-4 times a day  Add in [place], jig saw, connect 4 in between tablet and daily chores  Work on [employer] 10-15 minutes at a time - pay attention to the details  Post a list on the door of the things you need to bring:  [employer].

## 2020-01-05 ENCOUNTER — Encounter: Payer: 59 | Admitting: Occupational Therapy

## 2020-01-05 ENCOUNTER — Encounter (HOSPITAL_BASED_OUTPATIENT_CLINIC_OR_DEPARTMENT_OTHER): Payer: No Typology Code available for payment source | Admitting: Psychology

## 2020-01-05 ENCOUNTER — Encounter: Payer: Self-pay | Admitting: Psychology

## 2020-01-05 DIAGNOSIS — F0280 Dementia in other diseases classified elsewhere without behavioral disturbance: Secondary | ICD-10-CM

## 2020-01-05 DIAGNOSIS — F900 Attention-deficit hyperactivity disorder, predominantly inattentive type: Secondary | ICD-10-CM

## 2020-01-05 DIAGNOSIS — N183 Chronic kidney disease, stage 3 unspecified: Secondary | ICD-10-CM | POA: Diagnosis not present

## 2020-01-05 DIAGNOSIS — IMO0001 Reserved for inherently not codable concepts without codable children: Secondary | ICD-10-CM

## 2020-01-05 DIAGNOSIS — G44329 Chronic post-traumatic headache, not intractable: Secondary | ICD-10-CM

## 2020-01-05 DIAGNOSIS — F4323 Adjustment disorder with mixed anxiety and depressed mood: Secondary | ICD-10-CM

## 2020-01-05 DIAGNOSIS — S069X9S Unspecified intracranial injury with loss of consciousness of unspecified duration, sequela: Secondary | ICD-10-CM | POA: Diagnosis not present

## 2020-01-05 DIAGNOSIS — F068 Other specified mental disorders due to known physiological condition: Secondary | ICD-10-CM | POA: Diagnosis not present

## 2020-01-05 MED ORDER — AMPHETAMINE-DEXTROAMPHET ER 20 MG PO CP24: 40.0000 mg | ORAL_CAPSULE | Freq: Every day | ORAL | 0 refills | Status: DC

## 2020-01-05 NOTE — Telephone Encounter (Signed)
Dr. Riley Kill patient
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
## 2020-01-06 ENCOUNTER — Encounter: Payer: Self-pay | Admitting: Physical Therapy

## 2020-01-11 ENCOUNTER — Ambulatory Visit: Payer: No Typology Code available for payment source | Admitting: Physical Therapy

## 2020-01-11 ENCOUNTER — Other Ambulatory Visit: Payer: Self-pay

## 2020-01-11 ENCOUNTER — Encounter: Payer: Self-pay | Admitting: Speech Pathology

## 2020-01-11 ENCOUNTER — Encounter: Payer: Self-pay | Admitting: Occupational Therapy

## 2020-01-11 ENCOUNTER — Ambulatory Visit: Payer: No Typology Code available for payment source | Admitting: Occupational Therapy

## 2020-01-11 ENCOUNTER — Encounter: Payer: Self-pay | Admitting: Physical Therapy

## 2020-01-11 ENCOUNTER — Ambulatory Visit: Payer: No Typology Code available for payment source | Admitting: Speech Pathology

## 2020-01-11 DIAGNOSIS — R41841 Cognitive communication deficit: Secondary | ICD-10-CM

## 2020-01-11 DIAGNOSIS — R41840 Attention and concentration deficit: Secondary | ICD-10-CM

## 2020-01-11 DIAGNOSIS — M6281 Muscle weakness (generalized): Secondary | ICD-10-CM

## 2020-01-11 DIAGNOSIS — F900 Attention-deficit hyperactivity disorder, predominantly inattentive type: Secondary | ICD-10-CM | POA: Diagnosis not present

## 2020-01-11 DIAGNOSIS — M25512 Pain in left shoulder: Secondary | ICD-10-CM | POA: Insufficient documentation

## 2020-01-11 DIAGNOSIS — R2681 Unsteadiness on feet: Secondary | ICD-10-CM | POA: Insufficient documentation

## 2020-01-11 DIAGNOSIS — I82432 Acute embolism and thrombosis of left popliteal vein: Secondary | ICD-10-CM | POA: Diagnosis present

## 2020-01-11 DIAGNOSIS — R41842 Visuospatial deficit: Secondary | ICD-10-CM

## 2020-01-11 DIAGNOSIS — M25612 Stiffness of left shoulder, not elsewhere classified: Secondary | ICD-10-CM | POA: Insufficient documentation

## 2020-01-11 DIAGNOSIS — F068 Other specified mental disorders due to known physiological condition: Secondary | ICD-10-CM | POA: Diagnosis present

## 2020-01-11 DIAGNOSIS — S069X4S Unspecified intracranial injury with loss of consciousness of 6 hours to 24 hours, sequela: Secondary | ICD-10-CM | POA: Diagnosis present

## 2020-01-11 DIAGNOSIS — G44329 Chronic post-traumatic headache, not intractable: Secondary | ICD-10-CM | POA: Diagnosis present

## 2020-01-11 DIAGNOSIS — S069X0S Unspecified intracranial injury without loss of consciousness, sequela: Secondary | ICD-10-CM | POA: Diagnosis present

## 2020-01-11 DIAGNOSIS — R41844 Frontal lobe and executive function deficit: Secondary | ICD-10-CM

## 2020-01-11 DIAGNOSIS — H53141 Visual discomfort, right eye: Secondary | ICD-10-CM | POA: Diagnosis present

## 2020-01-11 DIAGNOSIS — R2689 Other abnormalities of gait and mobility: Secondary | ICD-10-CM | POA: Insufficient documentation

## 2020-01-11 DIAGNOSIS — F329 Major depressive disorder, single episode, unspecified: Secondary | ICD-10-CM | POA: Diagnosis present

## 2020-01-11 DIAGNOSIS — F4323 Adjustment disorder with mixed anxiety and depressed mood: Secondary | ICD-10-CM | POA: Diagnosis present

## 2020-01-11 DIAGNOSIS — M67912 Unspecified disorder of synovium and tendon, left shoulder: Secondary | ICD-10-CM | POA: Diagnosis present
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
## 2020-01-11 NOTE — Patient Instructions (Signed)
     Get the persons attention before you speak  Use eye contact and face the person you are speaking to  Be in close proximity to the person you are speaking to  Turn down any noise in the environment such as the TV, walk away from loud appliances, air conditioners, fans, dish washers etc  Repeat back what you have heard   Write it down if you ask a question twice
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
## 2020-01-16 ENCOUNTER — Other Ambulatory Visit: Payer: Self-pay

## 2020-01-16 ENCOUNTER — Encounter: Payer: Self-pay | Admitting: Physical Therapy

## 2020-01-16 ENCOUNTER — Ambulatory Visit: Payer: No Typology Code available for payment source | Admitting: Physical Therapy

## 2020-01-16 DIAGNOSIS — F900 Attention-deficit hyperactivity disorder, predominantly inattentive type: Secondary | ICD-10-CM | POA: Diagnosis not present

## 2020-01-16 DIAGNOSIS — R2689 Other abnormalities of gait and mobility: Secondary | ICD-10-CM

## 2020-01-16 DIAGNOSIS — R2681 Unsteadiness on feet: Secondary | ICD-10-CM
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
## 2020-01-18 ENCOUNTER — Encounter: Payer: Self-pay | Admitting: Physical Medicine & Rehabilitation

## 2020-01-18 ENCOUNTER — Ambulatory Visit: Payer: No Typology Code available for payment source | Admitting: Occupational Therapy

## 2020-01-18 ENCOUNTER — Encounter: Payer: Self-pay | Admitting: Physical Therapy

## 2020-01-18 ENCOUNTER — Encounter
Payer: No Typology Code available for payment source | Attending: Physical Medicine & Rehabilitation | Admitting: Physical Medicine & Rehabilitation

## 2020-01-18 ENCOUNTER — Encounter: Payer: Self-pay | Admitting: Occupational Therapy

## 2020-01-18 ENCOUNTER — Other Ambulatory Visit: Payer: Self-pay

## 2020-01-18 ENCOUNTER — Ambulatory Visit: Payer: No Typology Code available for payment source | Admitting: Physical Therapy

## 2020-01-18 VITALS — BP 134/79 | HR 85 | Temp 97.9°F | Ht 70.0 in | Wt 162.4 lb

## 2020-01-18 DIAGNOSIS — R41842 Visuospatial deficit: Secondary | ICD-10-CM

## 2020-01-18 DIAGNOSIS — M6281 Muscle weakness (generalized): Secondary | ICD-10-CM

## 2020-01-18 DIAGNOSIS — I82432 Acute embolism and thrombosis of left popliteal vein: Secondary | ICD-10-CM | POA: Insufficient documentation

## 2020-01-18 DIAGNOSIS — S069X4S Unspecified intracranial injury with loss of consciousness of 6 hours to 24 hours, sequela: Secondary | ICD-10-CM | POA: Insufficient documentation

## 2020-01-18 DIAGNOSIS — G44329 Chronic post-traumatic headache, not intractable: Secondary | ICD-10-CM | POA: Diagnosis not present

## 2020-01-18 DIAGNOSIS — R41840 Attention and concentration deficit: Secondary | ICD-10-CM

## 2020-01-18 DIAGNOSIS — R2689 Other abnormalities of gait and mobility: Secondary | ICD-10-CM

## 2020-01-18 DIAGNOSIS — R2681 Unsteadiness on feet: Secondary | ICD-10-CM

## 2020-01-18 DIAGNOSIS — F900 Attention-deficit hyperactivity disorder, predominantly inattentive type: Secondary | ICD-10-CM | POA: Insufficient documentation

## 2020-01-18 DIAGNOSIS — M25612 Stiffness of left shoulder, not elsewhere classified: Secondary | ICD-10-CM

## 2020-01-18 DIAGNOSIS — H53141 Visual discomfort, right eye: Secondary | ICD-10-CM | POA: Insufficient documentation

## 2020-01-18 DIAGNOSIS — F068 Other specified mental disorders due to known physiological condition: Secondary | ICD-10-CM | POA: Insufficient documentation

## 2020-01-18 DIAGNOSIS — S069X0S Unspecified intracranial injury without loss of consciousness, sequela: Secondary | ICD-10-CM | POA: Insufficient documentation

## 2020-01-18 DIAGNOSIS — M67912 Unspecified disorder of synovium and tendon, left shoulder: Secondary | ICD-10-CM | POA: Insufficient documentation

## 2020-01-18 DIAGNOSIS — F329 Major depressive disorder, single episode, unspecified: Secondary | ICD-10-CM | POA: Insufficient documentation

## 2020-01-18 DIAGNOSIS — R41841 Cognitive communication deficit: Secondary | ICD-10-CM | POA: Insufficient documentation

## 2020-01-18 DIAGNOSIS — R41844 Frontal lobe and executive function deficit: Secondary | ICD-10-CM

## 2020-01-18 DIAGNOSIS — F4323 Adjustment disorder with mixed anxiety and depressed mood: Secondary | ICD-10-CM | POA: Insufficient documentation

## 2020-01-18 DIAGNOSIS — M25512 Pain in left shoulder: Secondary | ICD-10-CM

## 2020-01-18 MED ORDER — CITALOPRAM HYDROBROMIDE 10 MG PO TABS: 10.0000 mg | ORAL_TABLET | Freq: Every day | ORAL | 2 refills | Status: DC

## 2020-01-18 MED ORDER — TAMSULOSIN HCL 0.4 MG PO CAPS: 0.4000 mg | ORAL_CAPSULE | Freq: Every day | ORAL | 5 refills | Status: DC

## 2020-01-18 MED ORDER — AMPHETAMINE-DEXTROAMPHETAMINE 10 MG PO TABS: 10.0000 mg | ORAL_TABLET | Freq: Two times a day (BID) | ORAL | 0 refills | Status: DC

## 2020-01-18 NOTE — Patient Instructions (Addendum)
TRY TO DRINK MORE IN THE MORNING AND BEFORE DINNER EACH DAY. DRINK LESS OVER NIGHT  PLEASE FEEL FREE TO CALL OUR OFFICE WITH ANY PROBLEMS OR QUESTIONS ([phone])
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
## 2020-01-18 NOTE — Patient Instructions (Signed)
Pendulum Side to Side    Bend forward 90 at waist, leaning on table for support. Rock body from side to side and let arm swing freely. Repeat _10_ times. Do __3__ sessions per day.  Copyright  VHI. All rights reserved.  Pendulum Circular    Bend forward 90 at waist, leaning on table for support. Rock body in a circular pattern to move arm clockwise _10__ times then counterclockwise _10_ times. Do _2-3_ sessions per day.  Copyright  VHI. All rights reserved.
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
## 2020-01-19 ENCOUNTER — Encounter (HOSPITAL_BASED_OUTPATIENT_CLINIC_OR_DEPARTMENT_OTHER): Payer: No Typology Code available for payment source | Admitting: Psychology

## 2020-01-19 DIAGNOSIS — F068 Other specified mental disorders due to known physiological condition: Secondary | ICD-10-CM

## 2020-01-19 DIAGNOSIS — S069X0S Unspecified intracranial injury without loss of consciousness, sequela: Secondary | ICD-10-CM

## 2020-01-19 DIAGNOSIS — S069X4S Unspecified intracranial injury with loss of consciousness of 6 hours to 24 hours, sequela: Secondary | ICD-10-CM | POA: Diagnosis not present

## 2020-01-19 DIAGNOSIS — F900 Attention-deficit hyperactivity disorder, predominantly inattentive type: Secondary | ICD-10-CM

## 2020-01-19 DIAGNOSIS — F4323 Adjustment disorder with mixed anxiety and depressed mood: Secondary | ICD-10-CM

## 2020-01-19 DIAGNOSIS — H53141 Visual discomfort, right eye: Secondary | ICD-10-CM

## 2020-01-19 DIAGNOSIS — G44329 Chronic post-traumatic headache, not intractable: Secondary | ICD-10-CM | POA: Diagnosis not present

## 2020-01-19 DIAGNOSIS — R41841 Cognitive communication deficit: Secondary | ICD-10-CM
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
## 2020-01-20 ENCOUNTER — Ambulatory Visit: Payer: No Typology Code available for payment source

## 2020-01-20 ENCOUNTER — Other Ambulatory Visit: Payer: Self-pay

## 2020-01-20 ENCOUNTER — Ambulatory Visit (INDEPENDENT_AMBULATORY_CARE_PROVIDER_SITE_OTHER): Payer: No Typology Code available for payment source | Admitting: Plastic Surgery

## 2020-01-20 ENCOUNTER — Ambulatory Visit
Admission: RE | Admit: 2020-01-20 | Discharge: 2020-01-20 | Disposition: A | Discharge status: Home / Self Care | Payer: No Typology Code available for payment source | Source: Ambulatory Visit | Attending: Physical Medicine & Rehabilitation | Admitting: Physical Medicine & Rehabilitation

## 2020-01-20 ENCOUNTER — Encounter: Payer: Self-pay | Admitting: Plastic Surgery

## 2020-01-20 VITALS — BP 141/78 | HR 88

## 2020-01-20 DIAGNOSIS — M67912 Unspecified disorder of synovium and tendon, left shoulder: Secondary | ICD-10-CM

## 2020-01-20 DIAGNOSIS — S0993XA Unspecified injury of face, initial encounter: Secondary | ICD-10-CM
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
## 2020-01-25 ENCOUNTER — Ambulatory Visit: Payer: No Typology Code available for payment source | Admitting: Physical Therapy

## 2020-01-25 ENCOUNTER — Ambulatory Visit: Payer: No Typology Code available for payment source | Admitting: Occupational Therapy

## 2020-01-26 ENCOUNTER — Encounter: Payer: Self-pay | Admitting: Psychology

## 2020-01-30 ENCOUNTER — Ambulatory Visit: Payer: No Typology Code available for payment source | Admitting: Physical Therapy

## 2020-01-30 ENCOUNTER — Ambulatory Visit: Payer: No Typology Code available for payment source | Admitting: Occupational Therapy

## 2020-01-30 ENCOUNTER — Encounter: Payer: Self-pay | Admitting: Physical Therapy

## 2020-01-30 ENCOUNTER — Other Ambulatory Visit: Payer: Self-pay

## 2020-01-30 DIAGNOSIS — R41840 Attention and concentration deficit: Secondary | ICD-10-CM

## 2020-01-30 DIAGNOSIS — R2689 Other abnormalities of gait and mobility: Secondary | ICD-10-CM

## 2020-01-30 DIAGNOSIS — R2681 Unsteadiness on feet: Secondary | ICD-10-CM

## 2020-01-30 DIAGNOSIS — F900 Attention-deficit hyperactivity disorder, predominantly inattentive type: Secondary | ICD-10-CM | POA: Diagnosis not present

## 2020-01-30 DIAGNOSIS — M6281 Muscle weakness (generalized): Secondary | ICD-10-CM

## 2020-01-30 DIAGNOSIS — R41842 Visuospatial deficit: Secondary | ICD-10-CM

## 2020-01-30 DIAGNOSIS — M25612 Stiffness of left shoulder, not elsewhere classified: Secondary | ICD-10-CM
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
## 2020-01-31 ENCOUNTER — Other Ambulatory Visit: Payer: Self-pay | Admitting: Physical Medicine & Rehabilitation

## 2020-01-31 DIAGNOSIS — G44321 Chronic post-traumatic headache, intractable: Secondary | ICD-10-CM

## 2020-01-31 NOTE — Patient Instructions (Signed)
Access Code: [email or web address] URL: [email or web address] Date: 01/31/2020 Prepared by: Mady Haagensen  Exercises Backward Walking with Counter Support - 1-2 x daily - 5 x weekly - 1 sets - 3-5 reps Walking with Head Rotation - 1-2 x daily - 5 x weekly - 1 sets - 3-5 reps Tandem Walking with Counter Support - 1-2 x daily - 5 x weekly - 1 sets - 3-5 reps Seated Scapular Retraction - 1 x daily - 5 x weekly - 2-3 sets - 10 reps - 3 sec hold Sit to Stand - 1-2 x daily - 5 x weekly - 2 sets - 5 reps Seated Hamstring Stretch - 1 x daily - 7 x weekly - 3 sets - 10 reps - 30 sec hold Standing Hip Abduction with Counter Support - 1 x daily - 5 x weekly - 3 sets - 10 reps - 3 sec hold Standing Hip Extension with Counter Support - 1 x daily - 5 x weekly - 3 sets - 10 reps - 3 sec hold Standing Marching - 1 x daily - 5 x weekly - 3 sets - 10 reps  Added 01/30/2020 Brandt-Daroff Vestibular Exercise - 2 x daily - 7 x weekly - 1 sets - 5 reps
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
## 2020-02-01 ENCOUNTER — Other Ambulatory Visit: Payer: Self-pay

## 2020-02-01 ENCOUNTER — Encounter: Payer: Self-pay | Admitting: Physical Therapy

## 2020-02-01 ENCOUNTER — Ambulatory Visit: Payer: No Typology Code available for payment source | Admitting: Physical Therapy

## 2020-02-01 ENCOUNTER — Ambulatory Visit: Payer: No Typology Code available for payment source | Admitting: Occupational Therapy

## 2020-02-01 DIAGNOSIS — M6281 Muscle weakness (generalized): Secondary | ICD-10-CM

## 2020-02-01 DIAGNOSIS — M25612 Stiffness of left shoulder, not elsewhere classified: Secondary | ICD-10-CM

## 2020-02-01 DIAGNOSIS — R41840 Attention and concentration deficit: Secondary | ICD-10-CM

## 2020-02-01 DIAGNOSIS — F900 Attention-deficit hyperactivity disorder, predominantly inattentive type: Secondary | ICD-10-CM | POA: Diagnosis not present

## 2020-02-01 DIAGNOSIS — R2681 Unsteadiness on feet: Secondary | ICD-10-CM

## 2020-02-01 DIAGNOSIS — R2689 Other abnormalities of gait and mobility: Secondary | ICD-10-CM

## 2020-02-01 DIAGNOSIS — R41842 Visuospatial deficit: Secondary | ICD-10-CM
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
## 2020-02-02 ENCOUNTER — Encounter: Payer: No Typology Code available for payment source | Attending: Psychology | Admitting: Psychology

## 2020-02-02 ENCOUNTER — Encounter: Payer: Self-pay | Admitting: Psychology

## 2020-02-02 DIAGNOSIS — R41841 Cognitive communication deficit: Secondary | ICD-10-CM | POA: Diagnosis not present

## 2020-02-02 DIAGNOSIS — G44329 Chronic post-traumatic headache, not intractable: Secondary | ICD-10-CM | POA: Insufficient documentation

## 2020-02-02 DIAGNOSIS — S069X4S Unspecified intracranial injury with loss of consciousness of 6 hours to 24 hours, sequela: Secondary | ICD-10-CM | POA: Insufficient documentation

## 2020-02-02 DIAGNOSIS — F900 Attention-deficit hyperactivity disorder, predominantly inattentive type: Secondary | ICD-10-CM | POA: Insufficient documentation

## 2020-02-02 DIAGNOSIS — F4323 Adjustment disorder with mixed anxiety and depressed mood: Secondary | ICD-10-CM | POA: Insufficient documentation

## 2020-02-02 DIAGNOSIS — H53141 Visual discomfort, right eye: Secondary | ICD-10-CM | POA: Diagnosis not present
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
## 2020-02-06 ENCOUNTER — Ambulatory Visit: Payer: No Typology Code available for payment source | Admitting: Physical Therapy

## 2020-02-06 ENCOUNTER — Ambulatory Visit: Payer: No Typology Code available for payment source

## 2020-02-06 ENCOUNTER — Ambulatory Visit: Payer: No Typology Code available for payment source | Admitting: Occupational Therapy

## 2020-02-06 ENCOUNTER — Other Ambulatory Visit: Payer: Self-pay

## 2020-02-06 DIAGNOSIS — R41840 Attention and concentration deficit: Secondary | ICD-10-CM

## 2020-02-06 DIAGNOSIS — R2681 Unsteadiness on feet: Secondary | ICD-10-CM

## 2020-02-06 DIAGNOSIS — R41841 Cognitive communication deficit: Secondary | ICD-10-CM

## 2020-02-06 DIAGNOSIS — F900 Attention-deficit hyperactivity disorder, predominantly inattentive type: Secondary | ICD-10-CM | POA: Diagnosis not present

## 2020-02-06 DIAGNOSIS — M25612 Stiffness of left shoulder, not elsewhere classified: Secondary | ICD-10-CM

## 2020-02-06 DIAGNOSIS — R41842 Visuospatial deficit: Secondary | ICD-10-CM
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
## 2020-02-06 NOTE — Patient Instructions (Addendum)
   Email Dr. Naaman Plummer about your shoulder pain for your homework. You see Mickel Baas on Wednesday.

## 2020-02-07 ENCOUNTER — Telehealth (HOSPITAL_COMMUNITY): Payer: Self-pay | Admitting: Physical Medicine & Rehabilitation

## 2020-02-07 DIAGNOSIS — I82432 Acute embolism and thrombosis of left popliteal vein: Secondary | ICD-10-CM

## 2020-02-07 DIAGNOSIS — F329 Major depressive disorder, single episode, unspecified: Secondary | ICD-10-CM | POA: Insufficient documentation

## 2020-02-07 DIAGNOSIS — M67912 Unspecified disorder of synovium and tendon, left shoulder: Secondary | ICD-10-CM

## 2020-02-07 MED ORDER — RIVAROXABAN 20 MG PO TABS: 20.0000 mg | ORAL_TABLET | Freq: Every day | ORAL | 4 refills | Status: DC

## 2020-02-07 MED ORDER — BUPROPION HCL 75 MG PO TABS: 75.0000 mg | ORAL_TABLET | Freq: Two times a day (BID) | ORAL | 3 refills | Status: DC

## 2020-02-07 NOTE — Telephone Encounter (Signed)
Please put [name] on the cancellation list for a left shoulder injection.  thx

## 2020-02-08 ENCOUNTER — Encounter: Payer: Self-pay | Admitting: Physical Therapy

## 2020-02-08 ENCOUNTER — Ambulatory Visit
Payer: No Typology Code available for payment source | Attending: Physical Medicine & Rehabilitation | Admitting: Physical Therapy

## 2020-02-08 ENCOUNTER — Ambulatory Visit: Payer: No Typology Code available for payment source | Admitting: Speech Pathology

## 2020-02-08 ENCOUNTER — Encounter: Payer: Self-pay | Admitting: Occupational Therapy

## 2020-02-08 ENCOUNTER — Ambulatory Visit: Payer: No Typology Code available for payment source | Admitting: Occupational Therapy

## 2020-02-08 ENCOUNTER — Other Ambulatory Visit: Payer: Self-pay

## 2020-02-08 VITALS — BP 164/84 | HR 74

## 2020-02-08 DIAGNOSIS — R2689 Other abnormalities of gait and mobility: Secondary | ICD-10-CM | POA: Diagnosis present

## 2020-02-08 DIAGNOSIS — R41841 Cognitive communication deficit: Secondary | ICD-10-CM | POA: Insufficient documentation

## 2020-02-08 DIAGNOSIS — M25612 Stiffness of left shoulder, not elsewhere classified: Secondary | ICD-10-CM | POA: Diagnosis present

## 2020-02-08 DIAGNOSIS — R2681 Unsteadiness on feet: Secondary | ICD-10-CM | POA: Diagnosis not present

## 2020-02-08 DIAGNOSIS — R41842 Visuospatial deficit: Secondary | ICD-10-CM | POA: Diagnosis present

## 2020-02-08 DIAGNOSIS — M25512 Pain in left shoulder: Secondary | ICD-10-CM | POA: Diagnosis present

## 2020-02-08 DIAGNOSIS — R41840 Attention and concentration deficit: Secondary | ICD-10-CM | POA: Diagnosis present

## 2020-02-08 DIAGNOSIS — M6281 Muscle weakness (generalized): Secondary | ICD-10-CM | POA: Diagnosis present

## 2020-02-08 NOTE — Patient Instructions (Signed)
   [facility] also has a day program  Call and see what they offer - I think this is a good idea to get Donyel active
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
## 2020-02-12 ENCOUNTER — Other Ambulatory Visit: Payer: Self-pay | Admitting: Physical Medicine & Rehabilitation

## 2020-02-12 DIAGNOSIS — G44321 Chronic post-traumatic headache, intractable: Secondary | ICD-10-CM

## 2020-02-13 ENCOUNTER — Ambulatory Visit: Payer: No Typology Code available for payment source | Admitting: Speech Pathology

## 2020-02-13 ENCOUNTER — Ambulatory Visit: Payer: No Typology Code available for payment source | Admitting: Occupational Therapy

## 2020-02-13 ENCOUNTER — Ambulatory Visit: Payer: No Typology Code available for payment source | Admitting: Physical Therapy

## 2020-02-15 ENCOUNTER — Other Ambulatory Visit: Payer: Self-pay

## 2020-02-15 ENCOUNTER — Encounter
Payer: No Typology Code available for payment source | Attending: Physical Medicine & Rehabilitation | Admitting: Physical Medicine & Rehabilitation

## 2020-02-15 ENCOUNTER — Encounter: Payer: Self-pay | Admitting: Speech Pathology

## 2020-02-15 ENCOUNTER — Ambulatory Visit: Payer: No Typology Code available for payment source | Admitting: Occupational Therapy

## 2020-02-15 ENCOUNTER — Encounter: Payer: Self-pay | Admitting: Physical Medicine & Rehabilitation

## 2020-02-15 ENCOUNTER — Ambulatory Visit: Payer: No Typology Code available for payment source

## 2020-02-15 ENCOUNTER — Ambulatory Visit: Payer: No Typology Code available for payment source | Admitting: Speech Pathology

## 2020-02-15 VITALS — BP 160/84 | HR 74 | Temp 98.0°F | Ht 70.0 in | Wt 160.0 lb

## 2020-02-15 DIAGNOSIS — R2689 Other abnormalities of gait and mobility: Secondary | ICD-10-CM | POA: Insufficient documentation

## 2020-02-15 DIAGNOSIS — M67912 Unspecified disorder of synovium and tendon, left shoulder: Secondary | ICD-10-CM | POA: Insufficient documentation

## 2020-02-15 DIAGNOSIS — M25512 Pain in left shoulder: Secondary | ICD-10-CM | POA: Insufficient documentation

## 2020-02-15 DIAGNOSIS — I82432 Acute embolism and thrombosis of left popliteal vein: Secondary | ICD-10-CM | POA: Diagnosis present

## 2020-02-15 DIAGNOSIS — R41841 Cognitive communication deficit: Secondary | ICD-10-CM | POA: Insufficient documentation

## 2020-02-15 DIAGNOSIS — R2681 Unsteadiness on feet: Secondary | ICD-10-CM | POA: Insufficient documentation

## 2020-02-15 DIAGNOSIS — R41842 Visuospatial deficit: Secondary | ICD-10-CM

## 2020-02-15 DIAGNOSIS — R41840 Attention and concentration deficit: Secondary | ICD-10-CM

## 2020-02-15 DIAGNOSIS — M75102 Unspecified rotator cuff tear or rupture of left shoulder, not specified as traumatic: Secondary | ICD-10-CM | POA: Insufficient documentation

## 2020-02-15 DIAGNOSIS — M25612 Stiffness of left shoulder, not elsewhere classified: Secondary | ICD-10-CM | POA: Insufficient documentation

## 2020-02-15 DIAGNOSIS — I491 Atrial premature depolarization: Secondary | ICD-10-CM | POA: Diagnosis not present

## 2020-02-15 DIAGNOSIS — M6281 Muscle weakness (generalized): Secondary | ICD-10-CM | POA: Insufficient documentation

## 2020-02-15 MED ORDER — RIVAROXABAN 20 MG PO TABS: 20.0000 mg | ORAL_TABLET | Freq: Every day | ORAL | 4 refills | Status: DC

## 2020-02-15 NOTE — Progress Notes (Signed)
PROCEDURE NOTE  DIAGNOSIS: left rotator cuff syndrome  INTERVENTION:  Major joint injection     After informed consent and preparation of the skin with betadine and isopropyl alcohol, I injected 6mg  (1cc) of celestone and 4cc of 1% lidocaine into the left subacromial space via lateral approach. Additionally, aspiration was performed prior to injection. The patient tolerated well, and no complications were encountered. Afterward the area was cleaned and dressed. Post- injection instructions were provided.      Meredith Staggers, MD, [facility] 02/15/2020   Additionally the patient reports frequent irregular heart beats that both he, his wife, and therapy have picked up on. He did not have these prior leaving the hospital to my knowledge. With auscultation, it sounds as if he is having frequent PAC's or PVC's. I made a referral to cardiology for further assessment. Wife tells me he just started wellbutrin a few days ago
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
## 2020-02-15 NOTE — Patient Instructions (Signed)
   Make a phone call to a friend - leave a message if they don't answer  Try writing a task on an index card (ie: sweep the floor) and let aid hand Mercedes the card when he is to complete the task. Try this with 2 chores or tasks a day. Could also be to complete PT or OT exercises  Have fun in [place]!!

## 2020-02-15 NOTE — Patient Instructions (Signed)
PLEASE FEEL FREE TO CALL OUR OFFICE WITH ANY PROBLEMS OR QUESTIONS ([phone])      

## 2020-02-16 ENCOUNTER — Encounter: Payer: No Typology Code available for payment source | Admitting: Psychology

## 2020-02-20 ENCOUNTER — Encounter: Payer: Medicare Other | Admitting: Occupational Therapy

## 2020-02-20 ENCOUNTER — Ambulatory Visit: Payer: Medicare Other | Admitting: Physical Therapy

## 2020-02-20 ENCOUNTER — Encounter: Payer: Medicare Other | Admitting: Speech Pathology

## 2020-02-22 ENCOUNTER — Ambulatory Visit: Payer: No Typology Code available for payment source | Admitting: Speech Pathology

## 2020-02-22 ENCOUNTER — Encounter: Payer: Self-pay | Admitting: Physical Therapy

## 2020-02-22 ENCOUNTER — Ambulatory Visit: Payer: No Typology Code available for payment source | Admitting: Occupational Therapy

## 2020-02-22 ENCOUNTER — Other Ambulatory Visit: Payer: Self-pay

## 2020-02-22 ENCOUNTER — Encounter: Payer: Self-pay | Admitting: Occupational Therapy

## 2020-02-22 ENCOUNTER — Ambulatory Visit: Payer: No Typology Code available for payment source | Admitting: Physical Therapy

## 2020-02-22 ENCOUNTER — Encounter: Payer: Self-pay | Admitting: Speech Pathology

## 2020-02-22 VITALS — BP 144/72 | HR 83

## 2020-02-22 DIAGNOSIS — R41840 Attention and concentration deficit: Secondary | ICD-10-CM

## 2020-02-22 DIAGNOSIS — M67912 Unspecified disorder of synovium and tendon, left shoulder: Secondary | ICD-10-CM

## 2020-02-22 DIAGNOSIS — R41842 Visuospatial deficit: Secondary | ICD-10-CM

## 2020-02-22 DIAGNOSIS — M6281 Muscle weakness (generalized): Secondary | ICD-10-CM

## 2020-02-22 DIAGNOSIS — R2681 Unsteadiness on feet: Secondary | ICD-10-CM

## 2020-02-22 DIAGNOSIS — M25512 Pain in left shoulder: Secondary | ICD-10-CM

## 2020-02-22 DIAGNOSIS — R41841 Cognitive communication deficit: Secondary | ICD-10-CM

## 2020-02-22 DIAGNOSIS — R2689 Other abnormalities of gait and mobility: Secondary | ICD-10-CM

## 2020-02-22 DIAGNOSIS — I491 Atrial premature depolarization: Secondary | ICD-10-CM | POA: Diagnosis not present

## 2020-02-22 DIAGNOSIS — M25612 Stiffness of left shoulder, not elsewhere classified: Secondary | ICD-10-CM
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
## 2020-02-22 NOTE — Patient Instructions (Addendum)
SELF ASSISTED WITH OBJECT:  ? ?Shoulder Flexion - Supine ? ? ? ?Hold cane with both hands. Raise arms overhead, keep elbows straight. 10_ reps per set, 2-3 sets per day, _5_ days per week ? ?Cane Horizontal - Supine ? ? ? ?With straight arms holding cane above shoulders, bring cane out to right, center, out to left, and back to above head. ?Repeat 10_ times. Do 2-3_ times per day ?Copyright ? V[employer]. All rights reserved.  ? ?Supine: Chest Press (Active) ? ? ? ?Lie on back with arms fully extended. Lower bar or dowel slowly to chest and press to arm's length. Use cane ?Complete 3 sets of _10 repetitions.  ? ?Shoulder: Abduction (Supine) ? ? ? ?With right arm flat on floor, hold dowel in palm. ?Slowly move arm up to side of head by pushing with opposite arm. Do not let elbow bend. ?Hold _10_ seconds. Repeat  10_ times. Do 3_ sessions per day. ?CAUTION: Stretch slowly and gently. ? ?Copyright ? VHI. All rights reserved.   ?
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
## 2020-02-23 NOTE — Addendum Note (Signed)
Addended by: Kerrie Pleasure on: 02/23/2020 01:34 PM   Modules accepted: Orders

## 2020-02-23 NOTE — Addendum Note (Signed)
Addended by: Kerrie Pleasure on: 02/23/2020 01:39 PM   Modules accepted: Orders

## 2020-02-27 ENCOUNTER — Ambulatory Visit: Payer: No Typology Code available for payment source | Attending: Physical Medicine & Rehabilitation

## 2020-02-27 ENCOUNTER — Other Ambulatory Visit: Payer: Self-pay

## 2020-02-27 DIAGNOSIS — M25612 Stiffness of left shoulder, not elsewhere classified: Secondary | ICD-10-CM | POA: Diagnosis present

## 2020-02-27 DIAGNOSIS — R41841 Cognitive communication deficit: Secondary | ICD-10-CM | POA: Insufficient documentation

## 2020-02-27 DIAGNOSIS — M6281 Muscle weakness (generalized): Secondary | ICD-10-CM | POA: Diagnosis present

## 2020-02-27 DIAGNOSIS — R41842 Visuospatial deficit: Secondary | ICD-10-CM | POA: Insufficient documentation

## 2020-02-27 DIAGNOSIS — M25512 Pain in left shoulder: Secondary | ICD-10-CM | POA: Insufficient documentation

## 2020-02-27 DIAGNOSIS — R41840 Attention and concentration deficit: Secondary | ICD-10-CM | POA: Insufficient documentation
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
## 2020-02-29 ENCOUNTER — Ambulatory Visit: Payer: No Typology Code available for payment source | Admitting: Speech Pathology

## 2020-02-29 ENCOUNTER — Ambulatory Visit: Payer: No Typology Code available for payment source | Admitting: Occupational Therapy

## 2020-02-29 ENCOUNTER — Encounter: Payer: Self-pay | Admitting: Speech Pathology

## 2020-02-29 ENCOUNTER — Encounter: Payer: Self-pay | Admitting: Occupational Therapy

## 2020-02-29 ENCOUNTER — Other Ambulatory Visit: Payer: Self-pay

## 2020-02-29 DIAGNOSIS — R41841 Cognitive communication deficit: Secondary | ICD-10-CM | POA: Diagnosis not present

## 2020-02-29 DIAGNOSIS — R41842 Visuospatial deficit: Secondary | ICD-10-CM

## 2020-02-29 DIAGNOSIS — R41840 Attention and concentration deficit: Secondary | ICD-10-CM

## 2020-02-29 DIAGNOSIS — M25612 Stiffness of left shoulder, not elsewhere classified: Secondary | ICD-10-CM

## 2020-02-29 DIAGNOSIS — M6281 Muscle weakness (generalized): Secondary | ICD-10-CM

## 2020-02-29 DIAGNOSIS — M25512 Pain in left shoulder: Secondary | ICD-10-CM
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
## 2020-03-01 ENCOUNTER — Other Ambulatory Visit: Payer: Self-pay

## 2020-03-01 ENCOUNTER — Encounter (HOSPITAL_BASED_OUTPATIENT_CLINIC_OR_DEPARTMENT_OTHER): Payer: No Typology Code available for payment source | Admitting: Psychology

## 2020-03-01 DIAGNOSIS — R41841 Cognitive communication deficit: Secondary | ICD-10-CM

## 2020-03-01 DIAGNOSIS — F4323 Adjustment disorder with mixed anxiety and depressed mood: Secondary | ICD-10-CM

## 2020-03-01 DIAGNOSIS — G44329 Chronic post-traumatic headache, not intractable: Secondary | ICD-10-CM | POA: Diagnosis not present

## 2020-03-01 DIAGNOSIS — S069X9D Unspecified intracranial injury with loss of consciousness of unspecified duration, subsequent encounter: Secondary | ICD-10-CM | POA: Diagnosis not present

## 2020-03-01 DIAGNOSIS — I491 Atrial premature depolarization: Secondary | ICD-10-CM | POA: Diagnosis not present

## 2020-03-01 DIAGNOSIS — F0280 Dementia in other diseases classified elsewhere without behavioral disturbance: Secondary | ICD-10-CM

## 2020-03-01 DIAGNOSIS — F068 Other specified mental disorders due to known physiological condition: Secondary | ICD-10-CM | POA: Diagnosis not present

## 2020-03-01 DIAGNOSIS — H53141 Visual discomfort, right eye: Secondary | ICD-10-CM | POA: Diagnosis not present

## 2020-03-01 DIAGNOSIS — S069X0S Unspecified intracranial injury without loss of consciousness, sequela: Secondary | ICD-10-CM

## 2020-03-01 DIAGNOSIS — IMO0001 Reserved for inherently not codable concepts without codable children: Secondary | ICD-10-CM
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
## 2020-03-02 ENCOUNTER — Other Ambulatory Visit: Payer: Medicare Other
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
## 2020-03-05 ENCOUNTER — Encounter: Payer: Self-pay | Admitting: Speech Pathology

## 2020-03-05 ENCOUNTER — Other Ambulatory Visit: Payer: Self-pay

## 2020-03-05 ENCOUNTER — Ambulatory Visit: Payer: No Typology Code available for payment source | Admitting: Speech Pathology

## 2020-03-05 DIAGNOSIS — R41841 Cognitive communication deficit: Secondary | ICD-10-CM | POA: Diagnosis not present

## 2020-03-05 NOTE — Patient Instructions (Signed)
    Make at least 2 phone calls this week - they only count if you get through and have a conversation  Let others know you sometimes loose your train of thought so they can help you  If you can't think of a word, describe it - you did this today "[place]"  Limit distractions when you are having a phone call or conversation - great job going to a quiet room.  Repeat back what you have heard to help you remember if it is important

## 2020-03-07 ENCOUNTER — Encounter: Payer: Self-pay | Admitting: Speech Pathology

## 2020-03-07 ENCOUNTER — Other Ambulatory Visit: Payer: Self-pay

## 2020-03-07 ENCOUNTER — Encounter: Payer: Self-pay | Admitting: Occupational Therapy

## 2020-03-07 ENCOUNTER — Ambulatory Visit: Payer: No Typology Code available for payment source | Admitting: Occupational Therapy

## 2020-03-07 ENCOUNTER — Ambulatory Visit: Payer: No Typology Code available for payment source | Admitting: Speech Pathology

## 2020-03-07 DIAGNOSIS — R41842 Visuospatial deficit: Secondary | ICD-10-CM

## 2020-03-07 DIAGNOSIS — R41840 Attention and concentration deficit: Secondary | ICD-10-CM

## 2020-03-07 DIAGNOSIS — M6281 Muscle weakness (generalized): Secondary | ICD-10-CM

## 2020-03-07 DIAGNOSIS — S069X9D Unspecified intracranial injury with loss of consciousness of unspecified duration, subsequent encounter: Secondary | ICD-10-CM | POA: Diagnosis present

## 2020-03-07 DIAGNOSIS — F900 Attention-deficit hyperactivity disorder, predominantly inattentive type: Secondary | ICD-10-CM | POA: Diagnosis present

## 2020-03-07 DIAGNOSIS — M67912 Unspecified disorder of synovium and tendon, left shoulder: Secondary | ICD-10-CM | POA: Diagnosis present

## 2020-03-07 DIAGNOSIS — F0280 Dementia in other diseases classified elsewhere without behavioral disturbance: Secondary | ICD-10-CM | POA: Diagnosis present

## 2020-03-07 DIAGNOSIS — M25512 Pain in left shoulder: Secondary | ICD-10-CM | POA: Insufficient documentation

## 2020-03-07 DIAGNOSIS — R41841 Cognitive communication deficit: Secondary | ICD-10-CM | POA: Diagnosis present

## 2020-03-07 DIAGNOSIS — M25612 Stiffness of left shoulder, not elsewhere classified: Secondary | ICD-10-CM | POA: Insufficient documentation

## 2020-03-07 DIAGNOSIS — S069X4S Unspecified intracranial injury with loss of consciousness of 6 hours to 24 hours, sequela: Secondary | ICD-10-CM | POA: Diagnosis present

## 2020-03-07 DIAGNOSIS — R2689 Other abnormalities of gait and mobility: Secondary | ICD-10-CM | POA: Insufficient documentation

## 2020-03-07 DIAGNOSIS — R2681 Unsteadiness on feet: Secondary | ICD-10-CM | POA: Insufficient documentation

## 2020-03-07 DIAGNOSIS — H53141 Visual discomfort, right eye: Secondary | ICD-10-CM | POA: Diagnosis present

## 2020-03-07 DIAGNOSIS — F4323 Adjustment disorder with mixed anxiety and depressed mood: Secondary | ICD-10-CM | POA: Diagnosis present

## 2020-03-07 DIAGNOSIS — G44329 Chronic post-traumatic headache, not intractable: Secondary | ICD-10-CM | POA: Diagnosis present

## 2020-03-07 DIAGNOSIS — I82432 Acute embolism and thrombosis of left popliteal vein: Secondary | ICD-10-CM | POA: Diagnosis present

## 2020-03-07 NOTE — Patient Instructions (Signed)
SELF ASSISTED WITH OBJECT:   Shoulder Flexion - Supine    Hold cane with both hands. Raise arms overhead, keep elbows straight. 10_ reps per set, 2-3 sets per day, _5_ days per week  Cane Horizontal - Supine    With straight arms holding cane above shoulders, bring cane out to right, center, out to left, and back to above head. Repeat 10_ times. Do 2-3_ times per day Copyright  [employer]. All rights reserved.   Supine: Chest Press (Active)    Lie on back with arms fully extended. Lower bar or dowel slowly to chest and press to arm's length. Use cane Complete 3 sets of _10 repetitions.   Shoulder: Abduction (Supine)    With right arm flat on floor, hold dowel in palm. Slowly move arm up to side of head by pushing with opposite arm. Do not let elbow bend. Hold _10_ seconds. Repeat  10_ times. Do 3_ sessions per day. CAUTION: Stretch slowly and gently.
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
## 2020-03-08 ENCOUNTER — Ambulatory Visit: Payer: No Typology Code available for payment source | Admitting: Physical Therapy

## 2020-03-09 ENCOUNTER — Telehealth: Payer: Self-pay | Admitting: Registered Nurse

## 2020-03-09 ENCOUNTER — Other Ambulatory Visit (HOSPITAL_COMMUNITY): Payer: Self-pay | Admitting: Physical Medicine & Rehabilitation

## 2020-03-09 ENCOUNTER — Encounter (HOSPITAL_COMMUNITY): Payer: Self-pay | Admitting: Physical Medicine & Rehabilitation

## 2020-03-09 DIAGNOSIS — M75102 Unspecified rotator cuff tear or rupture of left shoulder, not specified as traumatic: Secondary | ICD-10-CM

## 2020-03-09 DIAGNOSIS — G44329 Chronic post-traumatic headache, not intractable: Secondary | ICD-10-CM

## 2020-03-09 DIAGNOSIS — F900 Attention-deficit hyperactivity disorder, predominantly inattentive type: Secondary | ICD-10-CM

## 2020-03-09 DIAGNOSIS — M12812 Other specific arthropathies, not elsewhere classified, left shoulder: Secondary | ICD-10-CM

## 2020-03-09 MED ORDER — AMPHETAMINE-DEXTROAMPHETAMINE 10 MG PO TABS: 10.0000 mg | ORAL_TABLET | Freq: Two times a day (BID) | ORAL | 0 refills | Status: DC

## 2020-03-09 NOTE — Telephone Encounter (Signed)
Placed a call to Ms. Driggers she reports  Dion doesn't take the Adderall twice a day every day. Adderall e- scribed today. She verbalizes understanding. He has an appointment with Dr Naaman Plummer in two weeks, she is aware of the above.

## 2020-03-12 ENCOUNTER — Ambulatory Visit: Payer: No Typology Code available for payment source | Admitting: Occupational Therapy

## 2020-03-12 ENCOUNTER — Ambulatory Visit: Payer: No Typology Code available for payment source | Admitting: Physical Therapy

## 2020-03-12 ENCOUNTER — Ambulatory Visit: Payer: No Typology Code available for payment source | Admitting: Speech Pathology

## 2020-03-12 ENCOUNTER — Encounter: Payer: Self-pay | Admitting: Physical Therapy

## 2020-03-12 ENCOUNTER — Other Ambulatory Visit: Payer: Self-pay

## 2020-03-12 DIAGNOSIS — S069X9D Unspecified intracranial injury with loss of consciousness of unspecified duration, subsequent encounter: Secondary | ICD-10-CM | POA: Diagnosis not present

## 2020-03-12 DIAGNOSIS — R41840 Attention and concentration deficit: Secondary | ICD-10-CM

## 2020-03-12 DIAGNOSIS — R2689 Other abnormalities of gait and mobility: Secondary | ICD-10-CM

## 2020-03-12 DIAGNOSIS — R41841 Cognitive communication deficit: Secondary | ICD-10-CM | POA: Insufficient documentation

## 2020-03-12 DIAGNOSIS — R2681 Unsteadiness on feet: Secondary | ICD-10-CM

## 2020-03-12 DIAGNOSIS — M25612 Stiffness of left shoulder, not elsewhere classified: Secondary | ICD-10-CM

## 2020-03-12 DIAGNOSIS — M25512 Pain in left shoulder: Secondary | ICD-10-CM

## 2020-03-12 DIAGNOSIS — M6281 Muscle weakness (generalized): Secondary | ICD-10-CM
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
## 2020-03-12 NOTE — Patient Instructions (Signed)
   Please request all information in writing from HR, WC, insurance etc.  You are likely zoning in and out of these business conversations - e mail may be a way to go - ask people to email you information   Gary [name] job calling [employer] and check in with your friend

## 2020-03-12 NOTE — Patient Instructions (Signed)
Access Code: [email or web address] URL: [email or web address] Date: 03/12/2020 Prepared by: Mady Haagensen  Exercises Standing Tandem Balance with Counter Support - 1 x daily - 5 x weekly - 1 sets - 3 reps - 30 sec hold Single Leg Stance with Support - 1 x daily - 7 x weekly - 1 sets - 3 reps - 10 sec hold

## 2020-03-14 ENCOUNTER — Telehealth: Payer: Self-pay | Admitting: Physical Therapy

## 2020-03-14 ENCOUNTER — Encounter: Payer: Self-pay | Admitting: Psychology

## 2020-03-14 NOTE — Telephone Encounter (Signed)
Re:  Worker's Comp request to authorize additional visits.  Please see recent PT notes/Recert for updated goals and progress with PT, outlining continued needs for skilled PT to address balance, gait, functional strength to continue progression towards independence.  Please note PT is requesting 5 additional visits.  TBI - 07/21/19 cl# [id number]   BillingAddr: [place]  [address] Claim Adjuster:Tammy McQuay tel# [phone] fax# [phone]  Mady Haagensen, PT 03/14/20 3:27 PM Phone: [phone] Fax: [phone]

## 2020-03-15 ENCOUNTER — Encounter: Payer: No Typology Code available for payment source | Attending: Psychology | Admitting: Psychology

## 2020-03-15 ENCOUNTER — Encounter: Payer: Self-pay | Admitting: Psychology

## 2020-03-15 ENCOUNTER — Other Ambulatory Visit: Payer: Self-pay

## 2020-03-15 DIAGNOSIS — G44329 Chronic post-traumatic headache, not intractable: Secondary | ICD-10-CM | POA: Insufficient documentation

## 2020-03-15 DIAGNOSIS — F4323 Adjustment disorder with mixed anxiety and depressed mood: Secondary | ICD-10-CM | POA: Diagnosis not present

## 2020-03-15 DIAGNOSIS — S069X9D Unspecified intracranial injury with loss of consciousness of unspecified duration, subsequent encounter: Secondary | ICD-10-CM | POA: Insufficient documentation

## 2020-03-15 DIAGNOSIS — I82432 Acute embolism and thrombosis of left popliteal vein: Secondary | ICD-10-CM | POA: Insufficient documentation

## 2020-03-15 DIAGNOSIS — S069X4S Unspecified intracranial injury with loss of consciousness of 6 hours to 24 hours, sequela: Secondary | ICD-10-CM | POA: Insufficient documentation

## 2020-03-15 DIAGNOSIS — F900 Attention-deficit hyperactivity disorder, predominantly inattentive type: Secondary | ICD-10-CM | POA: Insufficient documentation

## 2020-03-15 DIAGNOSIS — F0280 Dementia in other diseases classified elsewhere without behavioral disturbance: Secondary | ICD-10-CM | POA: Insufficient documentation

## 2020-03-15 DIAGNOSIS — H53141 Visual discomfort, right eye: Secondary | ICD-10-CM | POA: Insufficient documentation

## 2020-03-15 DIAGNOSIS — R41841 Cognitive communication deficit: Secondary | ICD-10-CM | POA: Insufficient documentation

## 2020-03-15 DIAGNOSIS — IMO0001 Reserved for inherently not codable concepts without codable children: Secondary | ICD-10-CM

## 2020-03-15 DIAGNOSIS — M67912 Unspecified disorder of synovium and tendon, left shoulder: Secondary | ICD-10-CM | POA: Insufficient documentation
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
## 2020-03-21 ENCOUNTER — Encounter: Payer: Self-pay | Admitting: Physical Medicine & Rehabilitation

## 2020-03-21 ENCOUNTER — Encounter (HOSPITAL_BASED_OUTPATIENT_CLINIC_OR_DEPARTMENT_OTHER): Payer: No Typology Code available for payment source | Admitting: Physical Medicine & Rehabilitation

## 2020-03-21 ENCOUNTER — Other Ambulatory Visit: Payer: Self-pay

## 2020-03-21 ENCOUNTER — Ambulatory Visit: Payer: No Typology Code available for payment source | Admitting: Physical Therapy

## 2020-03-21 ENCOUNTER — Ambulatory Visit: Payer: No Typology Code available for payment source | Admitting: Physical Medicine & Rehabilitation

## 2020-03-21 ENCOUNTER — Encounter: Payer: Self-pay | Admitting: Occupational Therapy

## 2020-03-21 ENCOUNTER — Ambulatory Visit: Payer: No Typology Code available for payment source | Admitting: Occupational Therapy

## 2020-03-21 VITALS — BP 151/70 | HR 65 | Temp 97.7°F | Ht 70.0 in | Wt 159.0 lb

## 2020-03-21 DIAGNOSIS — S069X9D Unspecified intracranial injury with loss of consciousness of unspecified duration, subsequent encounter: Secondary | ICD-10-CM | POA: Diagnosis not present

## 2020-03-21 DIAGNOSIS — M67912 Unspecified disorder of synovium and tendon, left shoulder: Secondary | ICD-10-CM

## 2020-03-21 DIAGNOSIS — R2681 Unsteadiness on feet: Secondary | ICD-10-CM

## 2020-03-21 DIAGNOSIS — S069X4S Unspecified intracranial injury with loss of consciousness of 6 hours to 24 hours, sequela: Secondary | ICD-10-CM

## 2020-03-21 DIAGNOSIS — M25612 Stiffness of left shoulder, not elsewhere classified: Secondary | ICD-10-CM

## 2020-03-21 DIAGNOSIS — F900 Attention-deficit hyperactivity disorder, predominantly inattentive type: Secondary | ICD-10-CM | POA: Diagnosis not present

## 2020-03-21 DIAGNOSIS — R41840 Attention and concentration deficit: Secondary | ICD-10-CM

## 2020-03-21 DIAGNOSIS — I82432 Acute embolism and thrombosis of left popliteal vein: Secondary | ICD-10-CM | POA: Diagnosis not present

## 2020-03-21 DIAGNOSIS — M6281 Muscle weakness (generalized): Secondary | ICD-10-CM

## 2020-03-21 DIAGNOSIS — M25512 Pain in left shoulder: Secondary | ICD-10-CM

## 2020-03-21 MED ORDER — BUPROPION HCL ER (XL) 150 MG PO TB24: 150.0000 mg | ORAL_TABLET | ORAL | 2 refills | Status: DC
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
## 2020-03-21 NOTE — Patient Instructions (Signed)
PLEASE FEEL FREE TO CALL OUR OFFICE WITH ANY PROBLEMS OR QUESTIONS ([phone])      
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
## 2020-03-22 ENCOUNTER — Telehealth: Payer: Self-pay | Admitting: Physical Medicine & Rehabilitation

## 2020-03-22 NOTE — Telephone Encounter (Signed)
Meredeth Ide called about getting order for patient's Doppler.  I didn't see one in system, but it was mentioned in note.  Please fax to Meredeth Ide at [phone].Marland Kitchen

## 2020-03-28 ENCOUNTER — Other Ambulatory Visit: Payer: Self-pay

## 2020-03-28 ENCOUNTER — Encounter: Payer: Self-pay | Admitting: Physical Therapy

## 2020-03-28 ENCOUNTER — Ambulatory Visit: Payer: No Typology Code available for payment source | Admitting: Physical Therapy

## 2020-03-28 ENCOUNTER — Ambulatory Visit: Payer: No Typology Code available for payment source | Admitting: Occupational Therapy

## 2020-03-28 DIAGNOSIS — R41840 Attention and concentration deficit: Secondary | ICD-10-CM

## 2020-03-28 DIAGNOSIS — R2689 Other abnormalities of gait and mobility: Secondary | ICD-10-CM

## 2020-03-28 DIAGNOSIS — M6281 Muscle weakness (generalized): Secondary | ICD-10-CM

## 2020-03-28 DIAGNOSIS — R2681 Unsteadiness on feet: Secondary | ICD-10-CM

## 2020-03-28 DIAGNOSIS — S069X9D Unspecified intracranial injury with loss of consciousness of unspecified duration, subsequent encounter: Secondary | ICD-10-CM | POA: Diagnosis not present

## 2020-03-28 DIAGNOSIS — M25512 Pain in left shoulder: Secondary | ICD-10-CM

## 2020-03-28 DIAGNOSIS — M25612 Stiffness of left shoulder, not elsewhere classified: Secondary | ICD-10-CM

## 2020-03-28 NOTE — Patient Instructions (Signed)
Access Code: [email or web address] URL: [email or web address] Date: 03/28/2020 Prepared by: Willow Ora  Exercises Standing Tandem Balance with Counter Support - 1 x daily - 5 x weekly - 1 sets - 3 reps - 30 sec hold Single Leg Stance with Support - 1 x daily - 7 x weekly - 1 sets - 3 reps - 10 sec hold Standing Near Stance in Corner with Eyes Closed - 1 x daily - 5 x weekly - 1 sets - 3 reps - 30 hold Standing Balance in Corner with Eyes Closed - 1 x daily - 5 x weekly - 1 sets - 10 reps
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
## 2020-03-29 ENCOUNTER — Encounter (HOSPITAL_BASED_OUTPATIENT_CLINIC_OR_DEPARTMENT_OTHER): Payer: No Typology Code available for payment source | Admitting: Psychology

## 2020-03-29 ENCOUNTER — Other Ambulatory Visit: Payer: Self-pay

## 2020-03-29 DIAGNOSIS — F4323 Adjustment disorder with mixed anxiety and depressed mood: Secondary | ICD-10-CM | POA: Diagnosis not present

## 2020-03-29 DIAGNOSIS — F0280 Dementia in other diseases classified elsewhere without behavioral disturbance: Secondary | ICD-10-CM

## 2020-03-29 DIAGNOSIS — R41841 Cognitive communication deficit: Secondary | ICD-10-CM | POA: Diagnosis not present

## 2020-03-29 DIAGNOSIS — S069X9D Unspecified intracranial injury with loss of consciousness of unspecified duration, subsequent encounter: Secondary | ICD-10-CM | POA: Diagnosis not present

## 2020-03-29 DIAGNOSIS — IMO0001 Reserved for inherently not codable concepts without codable children: Secondary | ICD-10-CM

## 2020-03-29 DIAGNOSIS — G44329 Chronic post-traumatic headache, not intractable: Secondary | ICD-10-CM

## 2020-03-29 DIAGNOSIS — H53141 Visual discomfort, right eye: Secondary | ICD-10-CM | POA: Diagnosis not present
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
## 2020-04-01 ENCOUNTER — Encounter: Payer: Self-pay | Admitting: Psychology

## 2020-04-04 ENCOUNTER — Ambulatory Visit: Payer: No Typology Code available for payment source | Admitting: Speech Pathology

## 2020-04-04 ENCOUNTER — Encounter: Payer: Self-pay | Admitting: Physical Therapy

## 2020-04-04 ENCOUNTER — Ambulatory Visit: Payer: No Typology Code available for payment source | Admitting: Occupational Therapy

## 2020-04-04 ENCOUNTER — Other Ambulatory Visit: Payer: Self-pay

## 2020-04-04 ENCOUNTER — Encounter: Payer: Self-pay | Admitting: Speech Pathology

## 2020-04-04 ENCOUNTER — Ambulatory Visit: Payer: No Typology Code available for payment source | Admitting: Physical Therapy

## 2020-04-04 DIAGNOSIS — R41841 Cognitive communication deficit: Secondary | ICD-10-CM

## 2020-04-04 DIAGNOSIS — M6281 Muscle weakness (generalized): Secondary | ICD-10-CM

## 2020-04-04 DIAGNOSIS — R2681 Unsteadiness on feet: Secondary | ICD-10-CM

## 2020-04-04 DIAGNOSIS — S069X9D Unspecified intracranial injury with loss of consciousness of unspecified duration, subsequent encounter: Secondary | ICD-10-CM | POA: Diagnosis not present

## 2020-04-04 DIAGNOSIS — R2689 Other abnormalities of gait and mobility: Secondary | ICD-10-CM

## 2020-04-04 NOTE — Patient Instructions (Signed)
    Charge your phone at night and keep it on all day  The battery will keep
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
## 2020-04-06 ENCOUNTER — Encounter (HOSPITAL_COMMUNITY): Payer: Medicare Other

## 2020-04-09 ENCOUNTER — Other Ambulatory Visit: Payer: Self-pay

## 2020-04-09 ENCOUNTER — Other Ambulatory Visit (HOSPITAL_COMMUNITY): Payer: Self-pay | Admitting: Cardiovascular Disease

## 2020-04-09 ENCOUNTER — Ambulatory Visit (HOSPITAL_COMMUNITY)
Admission: RE | Admit: 2020-04-09 | Discharge: 2020-04-09 | Disposition: A | Discharge status: Home / Self Care | Payer: Medicare Other | Source: Ambulatory Visit | Attending: Physical Medicine & Rehabilitation | Admitting: Physical Medicine & Rehabilitation

## 2020-04-09 ENCOUNTER — Other Ambulatory Visit (HOSPITAL_COMMUNITY): Payer: Self-pay | Admitting: Physician Assistant

## 2020-04-09 DIAGNOSIS — M67912 Unspecified disorder of synovium and tendon, left shoulder: Secondary | ICD-10-CM | POA: Diagnosis not present

## 2020-04-10 ENCOUNTER — Ambulatory Visit
Payer: No Typology Code available for payment source | Attending: Physical Medicine & Rehabilitation | Admitting: Physical Therapy

## 2020-04-10 ENCOUNTER — Encounter: Payer: Self-pay | Admitting: Physical Therapy

## 2020-04-10 ENCOUNTER — Telehealth (HOSPITAL_COMMUNITY): Payer: Self-pay | Admitting: Physical Medicine & Rehabilitation

## 2020-04-10 DIAGNOSIS — R2681 Unsteadiness on feet: Secondary | ICD-10-CM | POA: Diagnosis not present

## 2020-04-10 DIAGNOSIS — M6281 Muscle weakness (generalized): Secondary | ICD-10-CM | POA: Diagnosis present

## 2020-04-10 DIAGNOSIS — R2689 Other abnormalities of gait and mobility: Secondary | ICD-10-CM | POA: Insufficient documentation

## 2020-04-10 DIAGNOSIS — R41841 Cognitive communication deficit: Secondary | ICD-10-CM | POA: Insufficient documentation

## 2020-04-10 NOTE — Telephone Encounter (Signed)
Called pt/wife and let them know that he needs to continue on xarelto given ongoing presence

## 2020-04-11 ENCOUNTER — Ambulatory Visit: Payer: No Typology Code available for payment source | Admitting: Speech Pathology

## 2020-04-11 ENCOUNTER — Other Ambulatory Visit: Payer: Self-pay

## 2020-04-11 DIAGNOSIS — R41841 Cognitive communication deficit: Secondary | ICD-10-CM

## 2020-04-11 DIAGNOSIS — R2681 Unsteadiness on feet: Secondary | ICD-10-CM | POA: Diagnosis not present
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
## 2020-04-12 ENCOUNTER — Encounter: Payer: No Typology Code available for payment source | Attending: Psychology | Admitting: Psychology

## 2020-04-12 DIAGNOSIS — R41841 Cognitive communication deficit: Secondary | ICD-10-CM | POA: Diagnosis present

## 2020-04-12 DIAGNOSIS — S069X9D Unspecified intracranial injury with loss of consciousness of unspecified duration, subsequent encounter: Secondary | ICD-10-CM | POA: Diagnosis not present

## 2020-04-12 DIAGNOSIS — F0280 Dementia in other diseases classified elsewhere without behavioral disturbance: Secondary | ICD-10-CM | POA: Diagnosis present

## 2020-04-12 DIAGNOSIS — G44329 Chronic post-traumatic headache, not intractable: Secondary | ICD-10-CM | POA: Diagnosis present

## 2020-04-12 DIAGNOSIS — IMO0001 Reserved for inherently not codable concepts without codable children: Secondary | ICD-10-CM

## 2020-04-12 DIAGNOSIS — H53141 Visual discomfort, right eye: Secondary | ICD-10-CM | POA: Insufficient documentation

## 2020-04-12 DIAGNOSIS — F4323 Adjustment disorder with mixed anxiety and depressed mood: Secondary | ICD-10-CM | POA: Diagnosis not present
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
## 2020-04-16 ENCOUNTER — Other Ambulatory Visit: Payer: Self-pay | Admitting: Physical Medicine & Rehabilitation

## 2020-04-16 DIAGNOSIS — F329 Major depressive disorder, single episode, unspecified: Secondary | ICD-10-CM

## 2020-04-17 ENCOUNTER — Ambulatory Visit: Payer: No Typology Code available for payment source | Admitting: Physical Therapy

## 2020-04-17 ENCOUNTER — Other Ambulatory Visit: Payer: Self-pay

## 2020-04-17 ENCOUNTER — Encounter: Payer: Self-pay | Admitting: Physical Therapy

## 2020-04-17 DIAGNOSIS — R2689 Other abnormalities of gait and mobility: Secondary | ICD-10-CM

## 2020-04-17 DIAGNOSIS — R2681 Unsteadiness on feet: Secondary | ICD-10-CM | POA: Diagnosis not present

## 2020-04-17 DIAGNOSIS — M6281 Muscle weakness (generalized): Secondary | ICD-10-CM

## 2020-04-18 ENCOUNTER — Ambulatory Visit: Payer: No Typology Code available for payment source | Admitting: Speech Pathology
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
## 2020-04-24 ENCOUNTER — Encounter: Payer: Self-pay | Admitting: Psychology

## 2020-05-01 DIAGNOSIS — F900 Attention-deficit hyperactivity disorder, predominantly inattentive type: Secondary | ICD-10-CM

## 2020-05-01 DIAGNOSIS — G44329 Chronic post-traumatic headache, not intractable: Secondary | ICD-10-CM

## 2020-05-01 MED ORDER — AMPHETAMINE-DEXTROAMPHET ER 20 MG PO CP24: 20.0000 mg | ORAL_CAPSULE | Freq: Every day | ORAL | 0 refills | Status: DC

## 2020-05-01 MED ORDER — AMPHETAMINE-DEXTROAMPHETAMINE 10 MG PO TABS: 10.0000 mg | ORAL_TABLET | Freq: Two times a day (BID) | ORAL | 0 refills | Status: DC

## 2020-05-01 NOTE — Telephone Encounter (Signed)
adderall xr and ir refilled

## 2020-05-01 NOTE — Progress Notes (Signed)
This pt is scheduled for surgery 05-02-2020 @[facility]  for Dr Frederico Hamman.  Pt wife stated last week on 04-24-2020 that pt is not having surgery, advised to call Dr Frederico Hamman office and let them know.  Pt was still on schedule yesterday,  lvm for pt to confirm, and called Dr Frederico Hamman office , spoke w/ office staff, to inform them pt stated not having surgery and he has not had covid test.  Today unable to reach someone at office , phone goes straight to on hold.

## 2020-05-02 ENCOUNTER — Ambulatory Visit: Payer: No Typology Code available for payment source | Admitting: Speech Pathology

## 2020-05-02 ENCOUNTER — Other Ambulatory Visit: Payer: Self-pay

## 2020-05-02 ENCOUNTER — Encounter: Payer: Self-pay | Admitting: Speech Pathology

## 2020-05-02 ENCOUNTER — Ambulatory Visit (HOSPITAL_BASED_OUTPATIENT_CLINIC_OR_DEPARTMENT_OTHER)
Admission: RE | Admit: 2020-05-02 | Payer: No Typology Code available for payment source | Source: Home / Self Care | Admitting: Ophthalmology

## 2020-05-02 DIAGNOSIS — R41841 Cognitive communication deficit: Secondary | ICD-10-CM

## 2020-05-02 DIAGNOSIS — R2681 Unsteadiness on feet: Secondary | ICD-10-CM | POA: Diagnosis not present

## 2020-05-02 SURGERY — STRABISMUS SURGERY, PEDIATRIC
Anesthesia: General | Laterality: Left

## 2020-05-02 NOTE — Patient Instructions (Signed)
   Take notes re: doctor's appointments immediately after  OR - ask for the information in writing from the doctor  Play memory by yourself, when you can really focus on remembering where the cards are
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
## 2020-05-17 ENCOUNTER — Other Ambulatory Visit: Payer: Self-pay

## 2020-05-17 ENCOUNTER — Encounter: Payer: No Typology Code available for payment source | Attending: Psychology | Admitting: Psychology

## 2020-05-17 DIAGNOSIS — S069X9D Unspecified intracranial injury with loss of consciousness of unspecified duration, subsequent encounter: Secondary | ICD-10-CM | POA: Insufficient documentation

## 2020-05-17 DIAGNOSIS — F4323 Adjustment disorder with mixed anxiety and depressed mood: Secondary | ICD-10-CM | POA: Diagnosis not present

## 2020-05-17 DIAGNOSIS — F0280 Dementia in other diseases classified elsewhere without behavioral disturbance: Secondary | ICD-10-CM | POA: Insufficient documentation

## 2020-05-17 DIAGNOSIS — IMO0001 Reserved for inherently not codable concepts without codable children: Secondary | ICD-10-CM

## 2020-05-17 DIAGNOSIS — R41841 Cognitive communication deficit: Secondary | ICD-10-CM | POA: Insufficient documentation
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
## 2020-05-30 ENCOUNTER — Encounter: Payer: Self-pay | Admitting: Psychology

## 2020-05-31 ENCOUNTER — Other Ambulatory Visit: Payer: Self-pay

## 2020-05-31 ENCOUNTER — Encounter (HOSPITAL_BASED_OUTPATIENT_CLINIC_OR_DEPARTMENT_OTHER): Payer: No Typology Code available for payment source | Admitting: Psychology

## 2020-05-31 ENCOUNTER — Encounter: Payer: Self-pay | Admitting: Psychology

## 2020-05-31 DIAGNOSIS — F0280 Dementia in other diseases classified elsewhere without behavioral disturbance: Secondary | ICD-10-CM | POA: Diagnosis not present

## 2020-05-31 DIAGNOSIS — IMO0001 Reserved for inherently not codable concepts without codable children: Secondary | ICD-10-CM

## 2020-05-31 DIAGNOSIS — R41841 Cognitive communication deficit: Secondary | ICD-10-CM

## 2020-05-31 DIAGNOSIS — S069X9D Unspecified intracranial injury with loss of consciousness of unspecified duration, subsequent encounter: Secondary | ICD-10-CM

## 2020-05-31 DIAGNOSIS — F4323 Adjustment disorder with mixed anxiety and depressed mood: Secondary | ICD-10-CM
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
## 2020-06-01 ENCOUNTER — Other Ambulatory Visit (HOSPITAL_COMMUNITY): Payer: Self-pay | Admitting: Physical Medicine & Rehabilitation

## 2020-06-01 DIAGNOSIS — G44329 Chronic post-traumatic headache, not intractable: Secondary | ICD-10-CM

## 2020-06-01 DIAGNOSIS — F900 Attention-deficit hyperactivity disorder, predominantly inattentive type: Secondary | ICD-10-CM

## 2020-06-01 MED ORDER — AMPHETAMINE-DEXTROAMPHET ER 20 MG PO CP24: 20.0000 mg | ORAL_CAPSULE | Freq: Every day | ORAL | 0 refills | Status: DC

## 2020-06-01 MED ORDER — AMPHETAMINE-DEXTROAMPHETAMINE 10 MG PO TABS: 10.0000 mg | ORAL_TABLET | Freq: Two times a day (BID) | ORAL | 0 refills | Status: DC

## 2020-06-01 NOTE — Telephone Encounter (Signed)
meds refilled 

## 2020-06-13 ENCOUNTER — Ambulatory Visit: Payer: Medicare Other | Admitting: Physical Medicine & Rehabilitation

## 2020-06-14 ENCOUNTER — Ambulatory Visit: Payer: Medicare Other | Admitting: Psychology

## 2020-06-17 ENCOUNTER — Other Ambulatory Visit: Payer: Self-pay | Admitting: Physical Medicine & Rehabilitation

## 2020-06-17 DIAGNOSIS — F329 Major depressive disorder, single episode, unspecified: Secondary | ICD-10-CM

## 2020-06-28 ENCOUNTER — Ambulatory Visit: Payer: Medicare Other | Admitting: Psychology

## 2020-07-03 ENCOUNTER — Ambulatory Visit: Payer: Medicare Other | Admitting: Psychology

## 2020-07-04 ENCOUNTER — Other Ambulatory Visit: Payer: Self-pay

## 2020-07-04 ENCOUNTER — Encounter: Payer: Self-pay | Admitting: Physical Medicine & Rehabilitation

## 2020-07-04 ENCOUNTER — Encounter
Payer: No Typology Code available for payment source | Attending: Physical Medicine & Rehabilitation | Admitting: Physical Medicine & Rehabilitation

## 2020-07-04 VITALS — BP 137/69 | HR 84 | Ht 70.0 in | Wt 158.0 lb

## 2020-07-04 DIAGNOSIS — H8110 Benign paroxysmal vertigo, unspecified ear: Secondary | ICD-10-CM | POA: Insufficient documentation

## 2020-07-04 DIAGNOSIS — M47816 Spondylosis without myelopathy or radiculopathy, lumbar region: Secondary | ICD-10-CM | POA: Insufficient documentation

## 2020-07-04 DIAGNOSIS — G44329 Chronic post-traumatic headache, not intractable: Secondary | ICD-10-CM | POA: Insufficient documentation

## 2020-07-04 DIAGNOSIS — F900 Attention-deficit hyperactivity disorder, predominantly inattentive type: Secondary | ICD-10-CM | POA: Insufficient documentation

## 2020-07-04 DIAGNOSIS — S069X4S Unspecified intracranial injury with loss of consciousness of 6 hours to 24 hours, sequela: Secondary | ICD-10-CM | POA: Diagnosis present

## 2020-07-04 MED ORDER — AMPHETAMINE-DEXTROAMPHET ER 20 MG PO CP24: 20.0000 mg | ORAL_CAPSULE | Freq: Every day | ORAL | 0 refills | Status: DC

## 2020-07-04 MED ORDER — AMPHETAMINE-DEXTROAMPHETAMINE 10 MG PO TABS: 10.0000 mg | ORAL_TABLET | Freq: Two times a day (BID) | ORAL | 0 refills | Status: DC
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
## 2020-07-04 NOTE — Patient Instructions (Addendum)
HAVE YOUR AIDES WORK ON YOUR EYE/VESTIBULAR EXERCISES EVERY DAY AFTER YOU FINISH THERAPY.   I WANT YOU GETTING OUT INTO THE COMMUNITY TO RE-ACCLIMATE YOURSELF WITH SOCIAL ACTIVITY, TRAVEL, LEISURE ACTIVITY, ETC.   YOU MAY USE UP TO 2000MG  OF TYLENOL PER DAY.  USE YOUR HEATING PAD FOR 15 MINUTES IN THE MORNING WHILE YOU'RE IN BED BEFORE YOU GET UP.

## 2020-07-24 ENCOUNTER — Encounter: Payer: Self-pay | Admitting: Psychology

## 2020-07-24 ENCOUNTER — Other Ambulatory Visit: Payer: Self-pay

## 2020-07-24 ENCOUNTER — Encounter: Payer: No Typology Code available for payment source | Attending: Psychology | Admitting: Psychology

## 2020-07-24 DIAGNOSIS — F0280 Dementia in other diseases classified elsewhere without behavioral disturbance: Secondary | ICD-10-CM | POA: Diagnosis present

## 2020-07-24 DIAGNOSIS — S069X9D Unspecified intracranial injury with loss of consciousness of unspecified duration, subsequent encounter: Secondary | ICD-10-CM | POA: Diagnosis present

## 2020-07-24 DIAGNOSIS — S069X4S Unspecified intracranial injury with loss of consciousness of 6 hours to 24 hours, sequela: Secondary | ICD-10-CM | POA: Diagnosis present

## 2020-07-24 DIAGNOSIS — IMO0001 Reserved for inherently not codable concepts without codable children: Secondary | ICD-10-CM

## 2020-07-24 DIAGNOSIS — G44329 Chronic post-traumatic headache, not intractable: Secondary | ICD-10-CM | POA: Diagnosis present
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
## 2020-08-02 ENCOUNTER — Other Ambulatory Visit: Payer: Self-pay | Admitting: Physical Medicine & Rehabilitation

## 2020-08-02 DIAGNOSIS — I82432 Acute embolism and thrombosis of left popliteal vein: Secondary | ICD-10-CM

## 2020-08-08 ENCOUNTER — Other Ambulatory Visit: Payer: Self-pay

## 2020-08-08 ENCOUNTER — Encounter: Payer: Self-pay | Admitting: Physical Therapy

## 2020-08-08 ENCOUNTER — Ambulatory Visit
Payer: No Typology Code available for payment source | Attending: Physical Medicine & Rehabilitation | Admitting: Physical Therapy

## 2020-08-08 DIAGNOSIS — R42 Dizziness and giddiness: Secondary | ICD-10-CM | POA: Diagnosis present

## 2020-08-08 DIAGNOSIS — M6281 Muscle weakness (generalized): Secondary | ICD-10-CM | POA: Diagnosis present

## 2020-08-08 DIAGNOSIS — S069X3S Unspecified intracranial injury with loss of consciousness of 1 hour to 5 hours 59 minutes, sequela: Secondary | ICD-10-CM | POA: Diagnosis present

## 2020-08-08 DIAGNOSIS — R2681 Unsteadiness on feet: Secondary | ICD-10-CM | POA: Diagnosis present
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
## 2020-08-09 ENCOUNTER — Other Ambulatory Visit: Payer: Self-pay

## 2020-08-09 DIAGNOSIS — F900 Attention-deficit hyperactivity disorder, predominantly inattentive type: Secondary | ICD-10-CM

## 2020-08-09 DIAGNOSIS — G44329 Chronic post-traumatic headache, not intractable: Secondary | ICD-10-CM

## 2020-08-09 MED ORDER — AMPHETAMINE-DEXTROAMPHETAMINE 10 MG PO TABS: 10.0000 mg | ORAL_TABLET | Freq: Two times a day (BID) | ORAL | 0 refills | Status: DC

## 2020-08-09 MED ORDER — AMPHETAMINE-DEXTROAMPHET ER 20 MG PO CP24: 20.0000 mg | ORAL_CAPSULE | Freq: Every day | ORAL | 0 refills | Status: DC

## 2020-08-09 NOTE — Telephone Encounter (Signed)
PMP: Last filled on 07/11/2020. Patient asking for refills.

## 2020-08-13 ENCOUNTER — Telehealth: Payer: Self-pay | Admitting: *Deleted

## 2020-08-13 NOTE — Telephone Encounter (Signed)
Prior auth request from pharmacy for generic adderal 10 and 20 mg.  Faxed to [facility].

## 2020-08-14 ENCOUNTER — Ambulatory Visit: Payer: No Typology Code available for payment source | Admitting: Physical Therapy

## 2020-08-14 ENCOUNTER — Other Ambulatory Visit: Payer: Self-pay

## 2020-08-14 ENCOUNTER — Telehealth: Payer: Self-pay | Admitting: *Deleted

## 2020-08-14 DIAGNOSIS — R42 Dizziness and giddiness: Secondary | ICD-10-CM | POA: Diagnosis not present

## 2020-08-14 DIAGNOSIS — R2681 Unsteadiness on feet: Secondary | ICD-10-CM

## 2020-08-14 DIAGNOSIS — S069X3S Unspecified intracranial injury with loss of consciousness of 1 hour to 5 hours 59 minutes, sequela: Secondary | ICD-10-CM

## 2020-08-14 DIAGNOSIS — F900 Attention-deficit hyperactivity disorder, predominantly inattentive type: Secondary | ICD-10-CM

## 2020-08-14 DIAGNOSIS — G44329 Chronic post-traumatic headache, not intractable: Secondary | ICD-10-CM

## 2020-08-14 DIAGNOSIS — M6281 Muscle weakness (generalized): Secondary | ICD-10-CM

## 2020-08-14 MED ORDER — AMPHETAMINE-DEXTROAMPHETAMINE 10 MG PO TABS: 10.0000 mg | ORAL_TABLET | Freq: Two times a day (BID) | ORAL | 0 refills | Status: DC

## 2020-08-14 NOTE — Telephone Encounter (Signed)
PMP was Reviewed Adderall e- scribed today.
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
## 2020-08-14 NOTE — Telephone Encounter (Signed)
Meredeth Ide called and reports that Mr Esh is unable to get his 10 mg Adderall at the [facility]. They have been out for about 3 weeks.  Please send the 10 mg dose to [facility] in [place].  He has the ER dose.

## 2020-08-16 ENCOUNTER — Other Ambulatory Visit: Payer: Self-pay | Admitting: Physical Medicine & Rehabilitation

## 2020-08-16 DIAGNOSIS — G44321 Chronic post-traumatic headache, intractable: Secondary | ICD-10-CM

## 2020-08-22 ENCOUNTER — Ambulatory Visit: Payer: No Typology Code available for payment source

## 2020-08-24 ENCOUNTER — Other Ambulatory Visit: Payer: Self-pay | Admitting: Physical Medicine & Rehabilitation

## 2020-08-24 ENCOUNTER — Ambulatory Visit: Payer: No Typology Code available for payment source

## 2020-08-28 ENCOUNTER — Ambulatory Visit: Payer: No Typology Code available for payment source

## 2020-08-28 ENCOUNTER — Other Ambulatory Visit: Payer: Self-pay

## 2020-08-28 DIAGNOSIS — R42 Dizziness and giddiness: Secondary | ICD-10-CM | POA: Diagnosis not present

## 2020-08-28 DIAGNOSIS — R2681 Unsteadiness on feet: Secondary | ICD-10-CM
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
## 2020-08-31 ENCOUNTER — Other Ambulatory Visit: Payer: Self-pay

## 2020-08-31 ENCOUNTER — Ambulatory Visit: Payer: No Typology Code available for payment source

## 2020-08-31 VITALS — BP 138/72 | HR 80

## 2020-08-31 DIAGNOSIS — R2681 Unsteadiness on feet: Secondary | ICD-10-CM

## 2020-08-31 DIAGNOSIS — R42 Dizziness and giddiness: Secondary | ICD-10-CM
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
## 2020-09-03 ENCOUNTER — Ambulatory Visit: Payer: No Typology Code available for payment source

## 2020-09-03 ENCOUNTER — Other Ambulatory Visit: Payer: Self-pay

## 2020-09-03 VITALS — BP 116/67 | HR 89

## 2020-09-03 DIAGNOSIS — R2681 Unsteadiness on feet: Secondary | ICD-10-CM

## 2020-09-03 DIAGNOSIS — R42 Dizziness and giddiness: Secondary | ICD-10-CM | POA: Diagnosis not present
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
## 2020-09-05 ENCOUNTER — Other Ambulatory Visit: Payer: Self-pay

## 2020-09-05 ENCOUNTER — Ambulatory Visit: Payer: No Typology Code available for payment source

## 2020-09-05 VITALS — BP 137/87 | HR 86

## 2020-09-05 DIAGNOSIS — R42 Dizziness and giddiness: Secondary | ICD-10-CM

## 2020-09-05 DIAGNOSIS — R2681 Unsteadiness on feet: Secondary | ICD-10-CM
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
## 2020-09-11 ENCOUNTER — Ambulatory Visit: Payer: 59

## 2020-09-14 ENCOUNTER — Other Ambulatory Visit: Payer: Self-pay

## 2020-09-14 ENCOUNTER — Ambulatory Visit: Payer: 59 | Attending: Physical Medicine & Rehabilitation

## 2020-09-14 DIAGNOSIS — R42 Dizziness and giddiness: Secondary | ICD-10-CM | POA: Insufficient documentation

## 2020-09-14 DIAGNOSIS — R2681 Unsteadiness on feet: Secondary | ICD-10-CM | POA: Insufficient documentation
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
## 2020-09-17 ENCOUNTER — Ambulatory Visit: Payer: 59

## 2020-09-19 ENCOUNTER — Ambulatory Visit: Payer: 59

## 2020-09-24 ENCOUNTER — Other Ambulatory Visit: Payer: Self-pay

## 2020-09-24 ENCOUNTER — Ambulatory Visit: Payer: 59

## 2020-09-24 DIAGNOSIS — F900 Attention-deficit hyperactivity disorder, predominantly inattentive type: Secondary | ICD-10-CM

## 2020-09-24 DIAGNOSIS — G44329 Chronic post-traumatic headache, not intractable: Secondary | ICD-10-CM

## 2020-09-26 ENCOUNTER — Telehealth: Payer: Self-pay

## 2020-09-26 NOTE — Telephone Encounter (Signed)
Got a [facility] from [facility] that patient is looking for 2 prescriptions for Adderall. [facility]. [facility] has Adderall XR 20 mg and Adderall 10 mg on file and in stock. Will fill prescriptions. Left message for patient call back on unidentified VM.

## 2020-10-03 ENCOUNTER — Encounter: Payer: Self-pay | Admitting: Physical Medicine & Rehabilitation

## 2020-10-03 ENCOUNTER — Encounter
Payer: No Typology Code available for payment source | Attending: Psychology | Admitting: Physical Medicine & Rehabilitation

## 2020-10-03 ENCOUNTER — Other Ambulatory Visit: Payer: Self-pay

## 2020-10-03 VITALS — BP 178/70 | HR 80 | Ht 70.0 in | Wt 157.6 lb

## 2020-10-03 DIAGNOSIS — S069X4S Unspecified intracranial injury with loss of consciousness of 6 hours to 24 hours, sequela: Secondary | ICD-10-CM | POA: Insufficient documentation

## 2020-10-03 DIAGNOSIS — F900 Attention-deficit hyperactivity disorder, predominantly inattentive type: Secondary | ICD-10-CM | POA: Diagnosis present

## 2020-10-03 DIAGNOSIS — H532 Diplopia: Secondary | ICD-10-CM | POA: Insufficient documentation

## 2020-10-03 DIAGNOSIS — F329 Major depressive disorder, single episode, unspecified: Secondary | ICD-10-CM | POA: Insufficient documentation

## 2020-10-03 DIAGNOSIS — M67912 Unspecified disorder of synovium and tendon, left shoulder: Secondary | ICD-10-CM | POA: Diagnosis present

## 2020-10-03 MED ORDER — SILODOSIN 4 MG PO CAPS
4.0000 mg | ORAL_CAPSULE | Freq: Every day | ORAL | 3 refills | Status: DC
Start: 2020-10-03 — End: 2020-12-14

## 2020-10-03 MED ORDER — BUPROPION HCL ER (XL) 300 MG PO TB24: 300.0000 mg | ORAL_TABLET | Freq: Every day | ORAL | 4 refills | Status: DC
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
## 2020-10-03 NOTE — Patient Instructions (Signed)
PLEASE FEEL FREE TO CALL OUR OFFICE WITH ANY PROBLEMS OR QUESTIONS ([phone])      

## 2020-10-26 ENCOUNTER — Encounter: Payer: Self-pay | Admitting: Registered Nurse

## 2020-10-26 ENCOUNTER — Ambulatory Visit (INDEPENDENT_AMBULATORY_CARE_PROVIDER_SITE_OTHER): Payer: 59 | Admitting: Registered Nurse

## 2020-10-26 ENCOUNTER — Other Ambulatory Visit: Payer: Self-pay

## 2020-10-26 VITALS — BP 142/88 | HR 70 | Temp 98.2°F | Resp 18 | Ht 70.0 in | Wt 158.6 lb

## 2020-10-26 DIAGNOSIS — R002 Palpitations: Secondary | ICD-10-CM

## 2020-10-26 DIAGNOSIS — I10 Essential (primary) hypertension: Secondary | ICD-10-CM

## 2020-10-26 DIAGNOSIS — I7300 Raynaud's syndrome without gangrene: Secondary | ICD-10-CM

## 2020-10-26 DIAGNOSIS — R351 Nocturia: Secondary | ICD-10-CM

## 2020-10-26 DIAGNOSIS — E559 Vitamin D deficiency, unspecified: Secondary | ICD-10-CM

## 2020-10-26 DIAGNOSIS — R9431 Abnormal electrocardiogram [ECG] [EKG]: Secondary | ICD-10-CM

## 2020-10-26 DIAGNOSIS — Z8639 Personal history of other endocrine, nutritional and metabolic disease: Secondary | ICD-10-CM | POA: Diagnosis not present

## 2020-10-26 DIAGNOSIS — L989 Disorder of the skin and subcutaneous tissue, unspecified: Secondary | ICD-10-CM

## 2020-10-26 DIAGNOSIS — R42 Dizziness and giddiness: Secondary | ICD-10-CM

## 2020-10-26 LAB — HEMOGLOBIN A1C: Hgb A1c MFr Bld: 5.4 % (ref 4.6–6.5)

## 2020-10-26 LAB — CBC WITH DIFFERENTIAL/PLATELET
Basophils Absolute: 0 10*3/uL (ref 0.0–0.1)
Basophils Relative: 0.6 % (ref 0.0–3.0)
Eosinophils Absolute: 0.2 10*3/uL (ref 0.0–0.7)
Eosinophils Relative: 2.7 % (ref 0.0–5.0)
HCT: 48.2 % (ref 39.0–52.0)
Hemoglobin: 16.2 g/dL (ref 13.0–17.0)
Lymphocytes Relative: 16.5 % (ref 12.0–46.0)
Lymphs Abs: 0.9 10*3/uL (ref 0.7–4.0)
MCHC: 33.6 g/dL (ref 30.0–36.0)
MCV: 96.7 fl (ref 78.0–100.0)
Monocytes Absolute: 0.5 10*3/uL (ref 0.1–1.0)
Monocytes Relative: 9.9 % (ref 3.0–12.0)
Neutro Abs: 3.9 10*3/uL (ref 1.4–7.7)
Neutrophils Relative %: 70.3 % (ref 43.0–77.0)
Platelets: 509 10*3/uL — ABNORMAL HIGH (ref 150.0–400.0)
RBC: 4.98 Mil/uL (ref 4.22–5.81)
RDW: 14.3 % (ref 11.5–15.5)
WBC: 5.5 10*3/uL (ref 4.0–10.5)

## 2020-10-26 LAB — LIPID PANEL
Cholesterol: 181 mg/dL (ref 0–200)
HDL: 32.2 mg/dL — ABNORMAL LOW (ref >39.00)
NonHDL: 148.41
Total CHOL/HDL Ratio: 6
Triglycerides: 226 mg/dL — ABNORMAL HIGH (ref 0.0–149.0)
VLDL: 45.2 mg/dL — ABNORMAL HIGH (ref 0.0–40.0)

## 2020-10-26 LAB — B12 AND FOLATE PANEL
Folate: 16.1 ng/mL (ref >5.9)
Vitamin B-12: 766 pg/mL (ref 211–911)

## 2020-10-26 LAB — COMPREHENSIVE METABOLIC PANEL
ALT: 15 U/L (ref 0–53)
AST: 21 U/L (ref 0–37)
Albumin: 4.1 g/dL (ref 3.5–5.2)
Alkaline Phosphatase: 91 U/L (ref 39–117)
BUN: 23 mg/dL (ref 6–23)
CO2: 29 mEq/L (ref 19–32)
Calcium: 9.8 mg/dL (ref 8.4–10.5)
Chloride: 105 mEq/L (ref 96–112)
Creatinine, Ser: 1.67 mg/dL — ABNORMAL HIGH (ref 0.40–1.50)
GFR: 40.5 mL/min — ABNORMAL LOW (ref >60.00)
Glucose, Bld: 81 mg/dL (ref 70–99)
Potassium: 4.9 mEq/L (ref 3.5–5.1)
Sodium: 141 mEq/L (ref 135–145)
Total Bilirubin: 0.4 mg/dL (ref 0.2–1.2)
Total Protein: 7.3 g/dL (ref 6.0–8.3)

## 2020-10-26 LAB — PSA, MEDICARE: PSA: 9.52 ng/ml — ABNORMAL HIGH (ref 0.10–4.00)

## 2020-10-26 LAB — LDL CHOLESTEROL, DIRECT: Direct LDL: 97 mg/dL

## 2020-10-26 LAB — VITAMIN D 25 HYDROXY (VIT D DEFICIENCY, FRACTURES): VITD: 53.02 ng/mL (ref 30.00–100.00)

## 2020-10-26 LAB — TSH: TSH: 5.52 u[IU]/mL — ABNORMAL HIGH (ref 0.35–5.50)
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
## 2020-10-26 NOTE — Patient Instructions (Addendum)
Gary [name] -  Doristine Devoid to meet you. I admire your resolve through what has been - to make an understatement - one heck of a year.  In brief: EKG abnormal but nothing to panic about today. I'll have cardiology give you a call to get set up I have referred you to urology to explore options for the prostate. We will check PSA today to see if there is any more urgent concern. I have referred to dermatology. I am doubting there is serious underlying lesion but I want to be certain. Labs today will be back this afternoon. I'll call with any urgent concerns.  See you in 3 mo, sooner if you need anything!  Thanks,  Gary [name]     If you have lab work done today you will be contacted with your lab results within the next 2 weeks.  If you have not heard from [place] then please contact us. The fastest way to get your results is to register for My Chart.   IF you received an x-ray today, you will receive an invoice from [facility]. Please contact [facility] at [phone] with questions or concerns regarding your invoice.   IF you received labwork today, you will receive an invoice from [place]. Please contact [facility] at [phone] with questions or concerns regarding your invoice.   Our billing staff will not be able to assist you with questions regarding bills from these companies.  You will be contacted with the lab results as soon as they are available. The fastest way to get your results is to activate your My Chart account. Instructions are located on the last page of this paperwork. If you have not heard from [place] regarding the results in 2 weeks, please contact this office.

## 2020-10-28 LAB — ANA: Anti Nuclear Antibody (ANA): NEGATIVE

## 2020-10-29 ENCOUNTER — Other Ambulatory Visit: Payer: Self-pay

## 2020-10-29 ENCOUNTER — Encounter: Payer: No Typology Code available for payment source | Attending: Psychology | Admitting: Psychology

## 2020-10-29 DIAGNOSIS — G44329 Chronic post-traumatic headache, not intractable: Secondary | ICD-10-CM

## 2020-10-29 DIAGNOSIS — S069X4S Unspecified intracranial injury with loss of consciousness of 6 hours to 24 hours, sequela: Secondary | ICD-10-CM

## 2020-10-31 ENCOUNTER — Telehealth: Payer: Self-pay

## 2020-10-31 ENCOUNTER — Encounter: Payer: Self-pay | Admitting: Psychology

## 2020-10-31 NOTE — Telephone Encounter (Signed)
Prior Authorization needed for Silodosin 4 MG Caps.  Request and last office note faxed to [facility] fax [phone] (Worker's Comp Case Manager) on 10/31/2020.

## 2020-10-31 NOTE — Telephone Encounter (Signed)
Dr. Eda Keys if out of the office this week.   Meredeth Ide wanted to know if the need for Rapaflo / Silodosin  4 MG workers comp  related?
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
## 2020-11-05 NOTE — Telephone Encounter (Signed)
Dr Naaman Plummer response faxed to [facility].

## 2020-11-09 ENCOUNTER — Other Ambulatory Visit: Payer: Self-pay

## 2020-11-09 DIAGNOSIS — F900 Attention-deficit hyperactivity disorder, predominantly inattentive type: Secondary | ICD-10-CM

## 2020-11-09 MED ORDER — AMPHETAMINE-DEXTROAMPHETAMINE 10 MG PO TABS: 10.0000 mg | ORAL_TABLET | Freq: Two times a day (BID) | ORAL | 0 refills | Status: DC

## 2020-11-09 MED ORDER — AMPHETAMINE-DEXTROAMPHET ER 20 MG PO CP24: 20.0000 mg | ORAL_CAPSULE | Freq: Every day | ORAL | 0 refills | Status: DC

## 2020-11-09 NOTE — Telephone Encounter (Signed)
Refill request for Adderall 10 mg and 20 mg

## 2020-12-07 ENCOUNTER — Other Ambulatory Visit: Payer: Self-pay | Admitting: Physical Medicine & Rehabilitation

## 2020-12-07 DIAGNOSIS — F900 Attention-deficit hyperactivity disorder, predominantly inattentive type: Secondary | ICD-10-CM

## 2020-12-07 MED ORDER — AMPHETAMINE-DEXTROAMPHETAMINE 10 MG PO TABS: 10.0000 mg | ORAL_TABLET | Freq: Two times a day (BID) | ORAL | 0 refills | Status: DC

## 2020-12-07 MED ORDER — AMPHETAMINE-DEXTROAMPHET ER 20 MG PO CP24: 20.0000 mg | ORAL_CAPSULE | Freq: Every day | ORAL | 0 refills | Status: DC

## 2020-12-12 ENCOUNTER — Other Ambulatory Visit: Payer: Self-pay | Admitting: Urology

## 2020-12-12 ENCOUNTER — Telehealth: Payer: Self-pay | Admitting: *Deleted

## 2020-12-12 DIAGNOSIS — R972 Elevated prostate specific antigen [PSA]: Secondary | ICD-10-CM

## 2020-12-12 NOTE — Telephone Encounter (Signed)
Prior auth request for silodosin faxed to [employer] [facility].

## 2020-12-14 ENCOUNTER — Encounter: Payer: Self-pay | Admitting: Registered Nurse

## 2020-12-14 ENCOUNTER — Other Ambulatory Visit: Payer: Self-pay

## 2020-12-14 ENCOUNTER — Encounter: Payer: Self-pay | Admitting: Physical Medicine & Rehabilitation

## 2020-12-14 ENCOUNTER — Ambulatory Visit (INDEPENDENT_AMBULATORY_CARE_PROVIDER_SITE_OTHER): Payer: 59 | Admitting: Cardiology

## 2020-12-14 VITALS — BP 142/72 | HR 79 | Ht 70.0 in | Wt 156.5 lb

## 2020-12-14 DIAGNOSIS — Z86718 Personal history of other venous thrombosis and embolism: Secondary | ICD-10-CM | POA: Diagnosis not present

## 2020-12-14 DIAGNOSIS — S069X9S Unspecified intracranial injury with loss of consciousness of unspecified duration, sequela: Secondary | ICD-10-CM

## 2020-12-14 DIAGNOSIS — R9431 Abnormal electrocardiogram [ECG] [EKG]: Secondary | ICD-10-CM | POA: Diagnosis not present

## 2020-12-14 DIAGNOSIS — R002 Palpitations: Secondary | ICD-10-CM | POA: Diagnosis not present

## 2020-12-14 DIAGNOSIS — Z7189 Other specified counseling: Secondary | ICD-10-CM

## 2020-12-14 DIAGNOSIS — R42 Dizziness and giddiness: Secondary | ICD-10-CM | POA: Diagnosis not present

## 2020-12-14 DIAGNOSIS — I82532 Chronic embolism and thrombosis of left popliteal vein: Secondary | ICD-10-CM
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
## 2020-12-14 NOTE — Patient Instructions (Signed)
Medication Instructions:  °Your Physician recommend you continue on your current medication as directed.   ° °*If you need a refill on your cardiac medications before your next appointment, please call your pharmacy* ° ° °Lab Work: °None ordered today ° ° °Testing/Procedures: °Your physician has requested that you have an echocardiogram. Echocardiography is a painless test that uses sound waves to create images of your heart. It provides your doctor with information about the size and shape of your heart and how well your heart’s chambers and valves are working. This procedure takes approximately one hour. There are no restrictions for this procedure. °[address] ° ° ° °Follow-Up: °At [facility], you and your health needs are our priority.  As part of our continuing mission to provide you with exceptional heart care, we have created designated Provider Care Teams.  These Care Teams include your primary Cardiologist (physician) and Advanced Practice Providers (APPs -  Physician Assistants and Nurse Practitioners) who all work together to provide you with the care you need, when you need it. ° °We recommend signing up for the patient portal called "MyChart".  Sign up information is provided on this After Visit Summary.  MyChart is used to connect with patients for Virtual Visits (Telemedicine).  Patients are able to view lab/test results, encounter notes, upcoming appointments, etc.  Non-urgent messages can be sent to your provider as well.   °To learn more about what you can do with MyChart, go to [email or web address].   ° °Your next appointment:   °As needed ° °The format for your next appointment:   °In Person ° °Provider:   °Bridgette Christopher, MD  ° ° °

## 2020-12-17 ENCOUNTER — Other Ambulatory Visit: Payer: Self-pay | Admitting: Registered Nurse

## 2020-12-19 ENCOUNTER — Encounter: Payer: No Typology Code available for payment source | Attending: Psychology | Admitting: Psychology

## 2020-12-19 ENCOUNTER — Other Ambulatory Visit: Payer: Self-pay

## 2020-12-19 DIAGNOSIS — S069X4S Unspecified intracranial injury with loss of consciousness of 6 hours to 24 hours, sequela: Secondary | ICD-10-CM | POA: Diagnosis not present

## 2020-12-19 DIAGNOSIS — G44329 Chronic post-traumatic headache, not intractable: Secondary | ICD-10-CM

## 2020-12-30 ENCOUNTER — Other Ambulatory Visit: Payer: Self-pay | Admitting: Physical Medicine & Rehabilitation

## 2020-12-30 DIAGNOSIS — I82432 Acute embolism and thrombosis of left popliteal vein: Secondary | ICD-10-CM

## 2021-01-02 ENCOUNTER — Encounter: Payer: Self-pay | Admitting: Psychology
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
## 2021-01-03 ENCOUNTER — Ambulatory Visit (INDEPENDENT_AMBULATORY_CARE_PROVIDER_SITE_OTHER): Payer: 59

## 2021-01-03 ENCOUNTER — Other Ambulatory Visit: Payer: Self-pay

## 2021-01-03 DIAGNOSIS — R9431 Abnormal electrocardiogram [ECG] [EKG]: Secondary | ICD-10-CM | POA: Diagnosis not present

## 2021-01-03 LAB — ECHOCARDIOGRAM COMPLETE
AR max vel: 2.64 cm2
AV Area VTI: 2.79 cm2
AV Area mean vel: 2.79 cm2
AV Mean grad: 3 mmHg
AV Peak grad: 6.7 mmHg
AV Vena cont: 0.16 cm
Ao pk vel: 1.29 m/s
Area-P 1/2: 2.4 cm2
Calc EF: 41.7 %
MV M vel: 4.69 m/s
MV Peak grad: 88 mmHg
P 1/2 time: 424 msec
S' Lateral: 4.79 cm
Single Plane A2C EF: 34 %
Single Plane A4C EF: 46.6 %

## 2021-01-09 ENCOUNTER — Other Ambulatory Visit: Payer: Self-pay

## 2021-01-09 ENCOUNTER — Encounter: Payer: No Typology Code available for payment source | Attending: Psychology | Admitting: Psychology

## 2021-01-09 DIAGNOSIS — G44329 Chronic post-traumatic headache, not intractable: Secondary | ICD-10-CM | POA: Diagnosis not present

## 2021-01-09 DIAGNOSIS — F329 Major depressive disorder, single episode, unspecified: Secondary | ICD-10-CM | POA: Diagnosis not present

## 2021-01-09 DIAGNOSIS — S069X4S Unspecified intracranial injury with loss of consciousness of 6 hours to 24 hours, sequela: Secondary | ICD-10-CM | POA: Diagnosis present

## 2021-01-22 ENCOUNTER — Other Ambulatory Visit: Payer: Self-pay

## 2021-01-22 ENCOUNTER — Ambulatory Visit
Admission: RE | Admit: 2021-01-22 | Discharge: 2021-01-22 | Disposition: A | Discharge status: Home / Self Care | Payer: 59 | Source: Ambulatory Visit | Attending: Urology | Admitting: Urology

## 2021-01-22 DIAGNOSIS — R972 Elevated prostate specific antigen [PSA]: Secondary | ICD-10-CM

## 2021-01-22 MED ORDER — GADOBENATE DIMEGLUMINE 529 MG/ML IV SOLN
15.0000 mL | Freq: Once | INTRAVENOUS | Status: AC | PRN
  Administered 2021-01-22: 15 mL via INTRAVENOUS

## 2021-01-24 ENCOUNTER — Telehealth: Payer: Self-pay | Admitting: *Deleted

## 2021-01-24 NOTE — Telephone Encounter (Signed)
Notified to continue with his anticoagulation after doppler report.

## 2021-01-30 ENCOUNTER — Ambulatory Visit: Payer: 59 | Admitting: Psychology

## 2021-01-30 ENCOUNTER — Encounter: Payer: Self-pay | Admitting: Physical Medicine & Rehabilitation

## 2021-01-30 ENCOUNTER — Encounter
Payer: No Typology Code available for payment source | Attending: Physical Medicine & Rehabilitation | Admitting: Physical Medicine & Rehabilitation

## 2021-01-30 ENCOUNTER — Other Ambulatory Visit: Payer: Self-pay

## 2021-01-30 VITALS — BP 128/79 | HR 92 | Temp 98.0°F | Ht 70.0 in | Wt 153.0 lb

## 2021-01-30 DIAGNOSIS — G44329 Chronic post-traumatic headache, not intractable: Secondary | ICD-10-CM

## 2021-01-30 DIAGNOSIS — F900 Attention-deficit hyperactivity disorder, predominantly inattentive type: Secondary | ICD-10-CM | POA: Diagnosis not present

## 2021-01-30 DIAGNOSIS — I82432 Acute embolism and thrombosis of left popliteal vein: Secondary | ICD-10-CM

## 2021-01-30 DIAGNOSIS — M47816 Spondylosis without myelopathy or radiculopathy, lumbar region: Secondary | ICD-10-CM | POA: Diagnosis not present

## 2021-01-30 DIAGNOSIS — S069X4S Unspecified intracranial injury with loss of consciousness of 6 hours to 24 hours, sequela: Secondary | ICD-10-CM | POA: Diagnosis not present

## 2021-01-30 MED ORDER — AMPHETAMINE-DEXTROAMPHET ER 20 MG PO CP24: 20.0000 mg | ORAL_CAPSULE | Freq: Every day | ORAL | 0 refills | Status: DC

## 2021-01-30 MED ORDER — AMPHETAMINE-DEXTROAMPHETAMINE 10 MG PO TABS: 10.0000 mg | ORAL_TABLET | Freq: Two times a day (BID) | ORAL | 0 refills | Status: DC

## 2021-01-30 NOTE — Patient Instructions (Addendum)
PLEASE FEEL FREE TO CALL OUR OFFICE WITH ANY PROBLEMS OR QUESTIONS ([phone])  STAY ACTIVE! KEEP MOVING!
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
## 2021-02-01 ENCOUNTER — Encounter: Payer: Self-pay | Admitting: Physical Medicine & Rehabilitation

## 2021-02-04 ENCOUNTER — Other Ambulatory Visit: Payer: Self-pay

## 2021-02-04 ENCOUNTER — Encounter (HOSPITAL_BASED_OUTPATIENT_CLINIC_OR_DEPARTMENT_OTHER): Payer: Self-pay | Admitting: Cardiology

## 2021-02-04 ENCOUNTER — Telehealth: Payer: Self-pay

## 2021-02-04 ENCOUNTER — Ambulatory Visit (INDEPENDENT_AMBULATORY_CARE_PROVIDER_SITE_OTHER): Payer: 59 | Admitting: Cardiology

## 2021-02-04 VITALS — BP 126/68 | HR 90 | Ht 70.0 in | Wt 155.9 lb

## 2021-02-04 DIAGNOSIS — N1832 Chronic kidney disease, stage 3b: Secondary | ICD-10-CM | POA: Diagnosis not present

## 2021-02-04 DIAGNOSIS — Z79899 Other long term (current) drug therapy: Secondary | ICD-10-CM

## 2021-02-04 DIAGNOSIS — R002 Palpitations: Secondary | ICD-10-CM | POA: Diagnosis not present

## 2021-02-04 DIAGNOSIS — Z86718 Personal history of other venous thrombosis and embolism: Secondary | ICD-10-CM

## 2021-02-04 DIAGNOSIS — Z712 Person consulting for explanation of examination or test findings: Secondary | ICD-10-CM

## 2021-02-04 DIAGNOSIS — I429 Cardiomyopathy, unspecified: Secondary | ICD-10-CM

## 2021-02-04 NOTE — Patient Instructions (Signed)
Medication Instructions:  Your Physician recommend you continue on your current medication as directed.    *If you need a refill on your cardiac medications before your next appointment, please call your pharmacy*   Lab Work: Your provider has recommended lab work in prior to 1 month follow up (BMP, Mg). Please have this collected at [facility] at [place]. The lab is open 8:00 am - 4:30 pm. Please avoid 12:00p - 1:00p for lunch hour. You do not need an appointment. Please go to [address] [place], [place] [address]. This is in the Primary Care office on the 3rd floor, let them know you are there for blood work and they will direct you to the lab.  If you have labs (blood work) drawn today and your tests are completely normal, you will receive your results only by: [facility] (if you have MyChart) OR A paper copy in the mail If you have any lab test that is abnormal or we need to change your treatment, we will call you to review the results.   Testing/Procedures: None ordered today   Follow-Up: At [facility], you and your health needs are our priority.  As part of our continuing mission to provide you with exceptional heart care, we have created designated Provider Care Teams.  These Care Teams include your primary Cardiologist (physician) and Advanced Practice Providers (APPs -  Physician Assistants and Nurse Practitioners) who all work together to provide you with the care you need, when you need it.  We recommend signing up for the patient portal called "MyChart".  Sign up information is provided on this After Visit Summary.  MyChart is used to connect with patients for Virtual Visits (Telemedicine).  Patients are able to view lab/test results, encounter notes, upcoming appointments, etc.  Non-urgent messages can be sent to your provider as well.   To learn more about what you can do with MyChart, go to [email or web address].    Your next  appointment:   1 month(s)  The format for your next appointment:   In Person  Provider:   Buford Dresser, MD
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
## 2021-02-04 NOTE — Telephone Encounter (Signed)
PA request fax: D-Amphetamine  ER 20 MG was faxed to;   Claim #[id number] [place], [place] [phone] FAX [phone]

## 2021-02-05 ENCOUNTER — Encounter (HOSPITAL_BASED_OUTPATIENT_CLINIC_OR_DEPARTMENT_OTHER): Payer: Self-pay | Admitting: Cardiology

## 2021-02-05 DIAGNOSIS — Z86718 Personal history of other venous thrombosis and embolism: Secondary | ICD-10-CM | POA: Insufficient documentation

## 2021-02-05 DIAGNOSIS — I429 Cardiomyopathy, unspecified: Secondary | ICD-10-CM | POA: Insufficient documentation

## 2021-02-07 ENCOUNTER — Encounter: Payer: No Typology Code available for payment source | Attending: Psychology | Admitting: Psychology

## 2021-02-07 ENCOUNTER — Other Ambulatory Visit: Payer: Self-pay

## 2021-02-07 DIAGNOSIS — S069X4S Unspecified intracranial injury with loss of consciousness of 6 hours to 24 hours, sequela: Secondary | ICD-10-CM | POA: Insufficient documentation

## 2021-02-07 DIAGNOSIS — G44329 Chronic post-traumatic headache, not intractable: Secondary | ICD-10-CM | POA: Diagnosis not present

## 2021-02-07 DIAGNOSIS — F329 Major depressive disorder, single episode, unspecified: Secondary | ICD-10-CM | POA: Diagnosis not present

## 2021-02-13 ENCOUNTER — Encounter: Payer: Self-pay | Admitting: Psychology
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
## 2021-02-14 ENCOUNTER — Encounter: Payer: Self-pay | Admitting: Psychology
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
## 2021-02-18 ENCOUNTER — Encounter: Payer: Self-pay | Admitting: Registered Nurse

## 2021-02-20 ENCOUNTER — Encounter: Payer: No Typology Code available for payment source | Attending: Psychology | Admitting: Psychology

## 2021-02-20 ENCOUNTER — Other Ambulatory Visit: Payer: Self-pay

## 2021-02-20 DIAGNOSIS — M47816 Spondylosis without myelopathy or radiculopathy, lumbar region: Secondary | ICD-10-CM

## 2021-02-20 DIAGNOSIS — S069X4S Unspecified intracranial injury with loss of consciousness of 6 hours to 24 hours, sequela: Secondary | ICD-10-CM | POA: Diagnosis not present

## 2021-02-20 DIAGNOSIS — G44329 Chronic post-traumatic headache, not intractable: Secondary | ICD-10-CM

## 2021-02-20 DIAGNOSIS — F329 Major depressive disorder, single episode, unspecified: Secondary | ICD-10-CM

## 2021-02-22 ENCOUNTER — Encounter: Payer: Self-pay | Admitting: Registered Nurse

## 2021-02-22 ENCOUNTER — Ambulatory Visit (INDEPENDENT_AMBULATORY_CARE_PROVIDER_SITE_OTHER): Payer: 59 | Admitting: Registered Nurse

## 2021-02-22 ENCOUNTER — Encounter (HOSPITAL_BASED_OUTPATIENT_CLINIC_OR_DEPARTMENT_OTHER): Payer: Self-pay | Admitting: Cardiology

## 2021-02-22 VITALS — BP 149/83 | HR 87 | Temp 98.2°F | Resp 18 | Ht 70.0 in | Wt 156.6 lb

## 2021-02-22 DIAGNOSIS — J302 Other seasonal allergic rhinitis: Secondary | ICD-10-CM

## 2021-02-22 DIAGNOSIS — B351 Tinea unguium: Secondary | ICD-10-CM

## 2021-02-22 MED ORDER — AZELASTINE HCL 0.1 % NA SOLN
1.0000 | Freq: Two times a day (BID) | NASAL | 12 refills | Status: AC
Start: 2021-02-22 — End: ?

## 2021-02-22 MED ORDER — TERBINAFINE HCL 250 MG PO TABS: 125.0000 mg | ORAL_TABLET | Freq: Every day | ORAL | 0 refills | Status: DC

## 2021-02-22 MED ORDER — CETIRIZINE HCL 10 MG PO TABS: 10.0000 mg | ORAL_TABLET | Freq: Every day | ORAL | 11 refills | Status: AC

## 2021-02-22 NOTE — Patient Instructions (Addendum)
Mr. Leggette -   Doristine Devoid to see you  Hot soaks 3-4 times daily  Ok to start terbinafine 125mg  daily in the mornings for 12 weeks Let me know if you have upset stomach with this  Thank you  Denice Paradise

## 2021-02-22 NOTE — Progress Notes (Signed)
Established Patient Office Visit  Subjective:  Patient ID: Gary [name], male    DOB: 06-22-1945  Age: 76 y.o. MRN: [id number]  CC:  Chief Complaint  Patient presents with   Toe Pain    Patient states he is having problems with his right big toe. Patient states the toe is growing sideways and lookeds infected. Pt has been using OTC stuff  with no relief.   Referral    Patient would like to discuss referral for his allergies.    HPI Gary [name] presents for ingrown nail, allergies  Ingrown nail R great toe Thickened, peeling away  Painful and rubs into neighboring toe No acute injury or trauma Has had nail removed in past, bad experience, would prefer to avoid  Seasonal allergies Ongoing Uses flonase, dries him out too much Interested in alternatives Uses OTC po antihistamines sparingly.   Otherwise no concerns.  Past Medical History:  Diagnosis Date   ADHD    Allergy    CKD (chronic kidney disease)    GERD (gastroesophageal reflux disease)    History of chickenpox    History of diverticulitis 2007   History of kidney stones    HTN (hypertension)    Hypertension    Reflux    Renal disorder    kidney stones    Past Surgical History:  Procedure Laterality Date   MINOR REMOVAL OF MANDIBULAR HARDWARE N/A 12/15/2019   Procedure: REMOVAL OF RIGHT LATERAL ORBITAL MINIPLATE;  Surgeon: [name], DO;  Location: [facility];  Service: Plastics;  Laterality: N/A;   ORIF MANDIBULAR FRACTURE Bilateral 07/27/2019   Procedure: OPEN REDUCTION INTERNAL FIXATION (ORIF) OF COMPLEX ZYGOMATIC FRACTURE;  Surgeon: [name], DO;  Location: [facility];  Service: Plastics;  Laterality: Bilateral;  2 hours, please    Family History  Problem Relation Age of Onset   Heart disease Mother    Hypertension Mother    Stroke Mother     Social History   Socioeconomic History   Marital status: Married    Spouse name: Danney Bungert   Number of children: Not on file    Years of education: Not on file   Highest education level: Not on file  Occupational History   Occupation: Warden/ranger    Comment: [employer]  Tobacco Use   Smoking status: Former   Smokeless tobacco: Never  Scientific laboratory technician Use: Never used  Substance and Sexual Activity   Alcohol use: Not Currently   Drug use: Never   Sexual activity: Yes  Other Topics Concern   Not on file  Social History Narrative   ** Merged History Encounter **       Social Determinants of Health   Financial Resource Strain: Not on file  Food Insecurity: Not on file  Transportation Needs: Not on file  Physical Activity: Not on file  Stress: Not on file  Social Connections: Not on file  Intimate Partner Violence: Not on file    Outpatient Medications Prior to Visit  Medication Sig Dispense Refill   amphetamine-dextroamphetamine (ADDERALL XR) 20 MG 24 hr capsule Take 1 capsule (20 mg total) by mouth daily. 30 capsule 0   amphetamine-dextroamphetamine (ADDERALL) 10 MG tablet Take 1 tablet (10 mg total) by mouth 2 (two) times daily with a meal. 60 tablet 0   b complex vitamins capsule Take 1 capsule by mouth daily.     buPROPion (WELLBUTRIN XL) 300 MG 24 hr tablet Take 1 tablet (300 mg  total) by mouth daily. 30 tablet 4   CALCIUM PO Take 1 tablet by mouth daily.     finasteride (PROSCAR) 5 MG tablet Take 5 mg by mouth daily.     fluticasone (FLONASE) 50 MCG/ACT nasal spray Place 2 sprays into both nostrils daily. (Patient taking differently: Place 2 sprays into both nostrils daily as needed for rhinitis.) 16 g 6   Multiple Vitamin (MULTIVITAMIN WITH MINERALS) TABS tablet Take 1 tablet by mouth daily.     vitamin B-12 (CYANOCOBALAMIN) 500 MCG tablet TAKE TWO TABLETS BY MOUTH ONCE A DAY     vitamin C (ASCORBIC ACID) 500 MG tablet Take 500 mg by mouth daily.     Vitamin D, Cholecalciferol, 1000 units CAPS Take 1 capsule by mouth daily. (Patient taking differently: Take 1,000 Units by mouth  daily.) 60 capsule 0   XARELTO 20 MG TABS tablet TAKE 1 TABLET(20 MG) BY MOUTH DAILY 30 tablet 4   No facility-administered medications prior to visit.    Allergies  Allergen Reactions   Levaquin [Levofloxacin] Nausea And Vomiting   Shellfish Allergy Hives   Strattera [Atomoxetine Hcl] Other (See Comments)    Per wife it was ineffective but she does not remember a reaction    ROS Review of Systems  Constitutional: Negative.   HENT:  Positive for postnasal drip (allergies).   Eyes: Negative.   Respiratory: Negative.    Cardiovascular: Negative.   Gastrointestinal: Negative.   Genitourinary: Negative.   Musculoskeletal: Negative.   Skin: Negative.   Neurological: Negative.   Psychiatric/Behavioral: Negative.    All other systems reviewed and are negative.    Objective:    Physical Exam Constitutional:      General: He is not in acute distress.    Appearance: Normal appearance. He is normal weight. He is not ill-appearing, toxic-appearing or diaphoretic.  Cardiovascular:     Rate and Rhythm: Normal rate and regular rhythm.     Heart sounds: Normal heart sounds. No murmur heard.   No friction rub. No gallop.  Pulmonary:     Effort: Pulmonary effort is normal. No respiratory distress.     Breath sounds: Normal breath sounds. No stridor. No wheezing, rhonchi or rales.  Chest:     Chest wall: No tenderness.  Skin:    General: Skin is warm and dry.     Capillary Refill: Capillary refill takes less than 2 seconds.     Comments: Thickened and peeling nail of great toe of R foot. Apparent onychomycosis  Neurological:     General: No focal deficit present.     Mental Status: He is alert and oriented to person, place, and time. Mental status is at baseline.  Psychiatric:        Mood and Affect: Mood normal.        Behavior: Behavior normal.        Thought Content: Thought content normal.        Judgment: Judgment normal.    BP (!) 149/83    Pulse 87    Temp 98.2 F (36.8  C) (Temporal)    Resp 18    Ht 5\' 10"  (1.778 m)    Wt 156 lb 9.6 oz (71 kg)    SpO2 100%    BMI 22.47 kg/m  Wt Readings from Last 3 Encounters:  02/22/21 156 lb 9.6 oz (71 kg)  02/04/21 155 lb 14.4 oz (70.7 kg)  01/30/21 153 lb (69.4 kg)     Health Maintenance Due  Topic Date Due   COVID-19 Vaccine (1) Never done   Zoster Vaccines- Shingrix (1 of 2) Never done   Pneumonia Vaccine 67+ Years old (2 - PCV) 01/29/2007   INFLUENZA VACCINE  08/06/2020    There are no preventive care reminders to display for this patient.  Lab Results  Component Value Date   TSH 5.52 (H) 10/26/2020   Lab Results  Component Value Date   WBC 5.5 10/26/2020   HGB 16.2 10/26/2020   HCT 48.2 10/26/2020   MCV 96.7 10/26/2020   PLT 509.0 (H) 10/26/2020   Lab Results  Component Value Date   NA 141 10/26/2020   K 4.9 10/26/2020   CO2 29 10/26/2020   GLUCOSE 81 10/26/2020   BUN 23 10/26/2020   CREATININE 1.67 (H) 10/26/2020   BILITOT 0.4 10/26/2020   ALKPHOS 91 10/26/2020   AST 21 10/26/2020   ALT 15 10/26/2020   PROT 7.3 10/26/2020   ALBUMIN 4.1 10/26/2020   CALCIUM 9.8 10/26/2020   ANIONGAP 12 12/15/2019   GFR 40.50 (L) 10/26/2020   Lab Results  Component Value Date   CHOL 181 10/26/2020   Lab Results  Component Value Date   HDL 32.20 (L) 10/26/2020   Lab Results  Component Value Date   LDLCALC 61 03/02/2018   Lab Results  Component Value Date   TRIG 226.0 (H) 10/26/2020   Lab Results  Component Value Date   CHOLHDL 6 10/26/2020   Lab Results  Component Value Date   HGBA1C 5.4 10/26/2020      Assessment & Plan:   Problem List Items Addressed This Visit   None Visit Diagnoses     Seasonal allergies    -  Primary   Relevant Medications   azelastine (ASTELIN) 0.1 % nasal spray   cetirizine (ZYRTEC) 10 MG tablet   Onychomycosis       Relevant Medications   terbinafine (LAMISIL) 250 MG tablet       Meds ordered this encounter  Medications   azelastine  (ASTELIN) 0.1 % nasal spray    Sig: Place 1 spray into both nostrils 2 (two) times daily. Use in each nostril as directed    Dispense:  30 mL    Refill:  12    Order Specific Question:   Supervising Provider    Answer:   Carlota Raspberry, JEFFREY R [[id number]]   cetirizine (ZYRTEC) 10 MG tablet    Sig: Take 1 tablet (10 mg total) by mouth daily.    Dispense:  30 tablet    Refill:  11    Order Specific Question:   Supervising Provider    Answer:   Carlota Raspberry, JEFFREY R [[id number]]   terbinafine (LAMISIL) 250 MG tablet    Sig: Take 0.5 tablets (125 mg total) by mouth daily.    Dispense:  42 tablet    Refill:  0    Order Specific Question:   Supervising Provider    Answer:   Carlota Raspberry, JEFFREY R [[id number]]    Follow-up: Return if symptoms worsen or fail to improve.   PLAN Azelastine and cetirizine for allergies.  Removed small portion of toenail of great toe of R foot today in office. Pt encouraged to pursue hot soaks 3-4 times daily and sending renal dose antifungal to pharmacy Return if worsening or failing to improve Patient encouraged to call clinic with any questions, comments, or concerns.  Maximiano Coss, NP

## 2021-03-03 ENCOUNTER — Other Ambulatory Visit: Payer: Self-pay | Admitting: Physical Medicine & Rehabilitation

## 2021-03-03 DIAGNOSIS — F329 Major depressive disorder, single episode, unspecified: Secondary | ICD-10-CM

## 2021-03-05 ENCOUNTER — Encounter: Payer: Self-pay | Admitting: Registered Nurse

## 2021-03-05 ENCOUNTER — Ambulatory Visit (INDEPENDENT_AMBULATORY_CARE_PROVIDER_SITE_OTHER): Payer: 59 | Admitting: Registered Nurse

## 2021-03-05 VITALS — BP 138/72 | HR 91 | Temp 97.9°F | Resp 18 | Ht 70.0 in | Wt 156.0 lb

## 2021-03-05 DIAGNOSIS — L03031 Cellulitis of right toe: Secondary | ICD-10-CM | POA: Diagnosis not present

## 2021-03-05 DIAGNOSIS — M79671 Pain in right foot: Secondary | ICD-10-CM | POA: Diagnosis not present

## 2021-03-05 DIAGNOSIS — R202 Paresthesia of skin: Secondary | ICD-10-CM | POA: Diagnosis not present

## 2021-03-05 LAB — COMPREHENSIVE METABOLIC PANEL
ALT: 30 U/L (ref 0–53)
AST: 33 U/L (ref 0–37)
Albumin: 4.2 g/dL (ref 3.5–5.2)
Alkaline Phosphatase: 99 U/L (ref 39–117)
BUN: 26 mg/dL — ABNORMAL HIGH (ref 6–23)
CO2: 28 mEq/L (ref 19–32)
Calcium: 10 mg/dL (ref 8.4–10.5)
Chloride: 105 mEq/L (ref 96–112)
Creatinine, Ser: 1.95 mg/dL — ABNORMAL HIGH (ref 0.40–1.50)
GFR: 33.54 mL/min — ABNORMAL LOW (ref >60.00)
Glucose, Bld: 56 mg/dL — ABNORMAL LOW (ref 70–99)
Potassium: 4.7 mEq/L (ref 3.5–5.1)
Sodium: 142 mEq/L (ref 135–145)
Total Bilirubin: 0.5 mg/dL (ref 0.2–1.2)
Total Protein: 7.4 g/dL (ref 6.0–8.3)

## 2021-03-05 LAB — URIC ACID: Uric Acid, Serum: 6 mg/dL (ref 4.0–7.8)

## 2021-03-05 LAB — B12 AND FOLATE PANEL
Folate: >24.2 ng/mL (ref >5.9)
Vitamin B-12: 379 pg/mL (ref 211–911)

## 2021-03-05 LAB — VITAMIN D 25 HYDROXY (VIT D DEFICIENCY, FRACTURES): VITD: 57.45 ng/mL (ref 30.00–100.00)

## 2021-03-05 MED ORDER — MUPIROCIN CALCIUM 2 % EX CREA: 1.0000 "application " | TOPICAL_CREAM | Freq: Two times a day (BID) | CUTANEOUS | 0 refills | Status: DC

## 2021-03-05 MED ORDER — TRAMADOL HCL 50 MG PO TABS: 25.0000 mg | ORAL_TABLET | Freq: Three times a day (TID) | ORAL | 0 refills | Status: AC | PRN

## 2021-03-05 MED ORDER — GABAPENTIN 100 MG PO CAPS: 100.0000 mg | ORAL_CAPSULE | Freq: Three times a day (TID) | ORAL | 3 refills | Status: DC

## 2021-03-05 MED ORDER — TRAMADOL HCL 50 MG PO TABS: 25.0000 mg | ORAL_TABLET | Freq: Three times a day (TID) | ORAL | 0 refills | Status: DC | PRN

## 2021-03-05 NOTE — Patient Instructions (Addendum)
Gary [name] -   A few possibilities -   Could be looking at gout. Will check labs and let you know. If we are, tylenol and tramadol to help. Try to limit ibuprofen, NSAIDs, aleve, etc. As they can negatively impact kidney function  Could be neuropathy - gabapentin can help  Could be underlying infection. If so, would need to see podiatry.  I'll let you know how labs look, you let me know how the next week goes  Thank you  Gary [name]     If you have lab work done today you will be contacted with your lab results within the next 2 weeks.  If you have not heard from [place] then please contact us. The fastest way to get your results is to register for My Chart.   IF you received an x-ray today, you will receive an invoice from [facility]. Please contact [facility] at [phone] with questions or concerns regarding your invoice.   IF you received labwork today, you will receive an invoice from [facility]. Please contact [facility] at [phone] with questions or concerns regarding your invoice.   Our billing staff will not be able to assist you with questions regarding bills from these companies.  You will be contacted with the lab results as soon as they are available. The fastest way to get your results is to activate your My Chart account. Instructions are located on the last page of this paperwork. If you have not heard from [place] regarding the results in 2 weeks, please contact this office.

## 2021-03-05 NOTE — Progress Notes (Signed)
Established Patient Office Visit  Subjective:  Patient ID: Gary [name], male    DOB: Dec 03, 1945  Age: 76 y.o. MRN: [id number]  CC:  Chief Complaint  Patient presents with   Follow-up    Patient states he is here for a follow up on foot pain. Patient states its throbbing and pins and needles.     HPI Gary [name] presents for ongoing foot pain.  We had seen him last on 02/22/21 for this issue. Obvious onychomycosis of [name] great toe Unfortunately the pain is worsened. Now throbbing. Nail continues to peel away.  He has been hesitant to see podiatry about this.   He has been soaking toe multiple times a day. Fortunately this softened nail enough that his wife could trim it at home. Did well with this. No wounds appreciable.  He does note pain. At times at tip of toe, other times on side of toe. Occasionaly down to 1st MTP. Some redness at first MTP but no exquisite tenderness or swelling. No hx of gout.  Notes he leaves for a trip to [place] this week.    Past Medical History:  Diagnosis Date   ADHD    Allergy    CKD (chronic kidney disease)    GERD (gastroesophageal reflux disease)    History of chickenpox    History of diverticulitis 2007   History of kidney stones    HTN (hypertension)    Hypertension    Reflux    Renal disorder    kidney stones    Past Surgical History:  Procedure Laterality Date   MINOR REMOVAL OF MANDIBULAR HARDWARE N/A 12/15/2019   Procedure: REMOVAL OF RIGHT LATERAL ORBITAL MINIPLATE;  Surgeon: Gary Going, Gary [name];  Location: [facility];  Service: Plastics;  Laterality: N/A;   ORIF MANDIBULAR FRACTURE Bilateral 07/27/2019   Procedure: OPEN REDUCTION INTERNAL FIXATION (ORIF) OF COMPLEX ZYGOMATIC FRACTURE;  Surgeon: Gary Going, Gary [name];  Location: [facility];  Service: Plastics;  Laterality: Bilateral;  2 hours, please    Family History  Problem Relation Age of Onset   Heart disease Mother    Hypertension Mother    Stroke Mother      Social History   Socioeconomic History   Marital status: Married    Spouse name: Corbett Moulder   Number of children: Not on file   Years of education: Not on file   Highest education level: Not on file  Occupational History   Occupation: Warden/ranger    Comment: [employer]  Tobacco Use   Smoking status: Former   Smokeless tobacco: Never  Scientific laboratory technician Use: Never used  Substance and Sexual Activity   Alcohol use: Not Currently   Drug use: Never   Sexual activity: Yes  Other Topics Concern   Not on file  Social History Narrative   ** Merged History Encounter **       Social Determinants of Health   Financial Resource Strain: Not on file  Food Insecurity: Not on file  Transportation Needs: Not on file  Physical Activity: Not on file  Stress: Not on file  Social Connections: Not on file  Intimate Partner Violence: Not on file    Outpatient Medications Prior to Visit  Medication Sig Dispense Refill   amphetamine-dextroamphetamine (ADDERALL XR) 20 MG 24 hr capsule Take 1 capsule (20 mg total) by mouth daily. 30 capsule 0   amphetamine-dextroamphetamine (ADDERALL) 10 MG tablet Take 1 tablet (10 mg total) by mouth 2 (two)  times daily with a meal. 60 tablet 0   azelastine (ASTELIN) 0.1 % nasal spray Place 1 spray into both nostrils 2 (two) times daily. Use in each nostril as directed 30 mL 12   b complex vitamins capsule Take 1 capsule by mouth daily.     buPROPion (WELLBUTRIN XL) 300 MG 24 hr tablet TAKE 1 TABLET(300 MG) BY MOUTH DAILY 30 tablet 4   CALCIUM PO Take 1 tablet by mouth daily.     cetirizine (ZYRTEC) 10 MG tablet Take 1 tablet (10 mg total) by mouth daily. 30 tablet 11   finasteride (PROSCAR) 5 MG tablet Take 5 mg by mouth daily.     fluticasone (FLONASE) 50 MCG/ACT nasal spray Place 2 sprays into both nostrils daily. (Patient taking differently: Place 2 sprays into both nostrils daily as needed for rhinitis.) 16 g 6   Multiple Vitamin  (MULTIVITAMIN WITH MINERALS) TABS tablet Take 1 tablet by mouth daily.     terbinafine (LAMISIL) 250 MG tablet Take 0.5 tablets (125 mg total) by mouth daily. 42 tablet 0   vitamin B-12 (CYANOCOBALAMIN) 500 MCG tablet TAKE TWO TABLETS BY MOUTH ONCE A DAY     vitamin C (ASCORBIC ACID) 500 MG tablet Take 500 mg by mouth daily.     Vitamin D, Cholecalciferol, 1000 units CAPS Take 1 capsule by mouth daily. (Patient taking differently: Take 1,000 Units by mouth daily.) 60 capsule 0   XARELTO 20 MG TABS tablet TAKE 1 TABLET(20 MG) BY MOUTH DAILY 30 tablet 4   No facility-administered medications prior to visit.    Allergies  Allergen Reactions   Levaquin [Levofloxacin] Nausea And Vomiting   Shellfish Allergy Hives   Strattera [Atomoxetine Hcl] Other (See Comments)    Per wife it was ineffective but she does not remember a reaction    ROS Review of Systems Per hpi     Objective:    Physical Exam Constitutional:      General: He is not in acute distress.    Appearance: Normal appearance. He is normal weight. He is not ill-appearing, toxic-appearing or diaphoretic.  Cardiovascular:     Rate and Rhythm: Normal rate and regular rhythm.     Heart sounds: Normal heart sounds. No murmur heard.   No friction rub. No gallop.  Pulmonary:     Effort: Pulmonary effort is normal. No respiratory distress.     Breath sounds: Normal breath sounds. No stridor. No wheezing, rhonchi or rales.  Chest:     Chest wall: No tenderness.  Musculoskeletal:        General: Tenderness (across most of great toe of [name] foot) present.  Skin:    Capillary Refill: Capillary refill takes less than 2 seconds.  Neurological:     General: No focal deficit present.     Mental Status: He is alert and oriented to person, place, and time. Mental status is at baseline.  Psychiatric:        Mood and Affect: Mood normal.        Behavior: Behavior normal.        Thought Content: Thought content normal.        Judgment:  Judgment normal.    BP 138/72    Pulse 91    Temp 97.9 F (36.6 C) (Temporal)    Resp 18    Ht 5\' 10"  (1.778 m)    Wt 156 lb (70.8 kg)    SpO2 100%    BMI 22.38 kg/m  Wt  Readings from Last 3 Encounters:  03/05/21 156 lb (70.8 kg)  02/22/21 156 lb 9.6 oz (71 kg)  02/04/21 155 lb 14.4 oz (70.7 kg)     Health Maintenance Due  Topic Date Due   COVID-19 Vaccine (1) Never done   Zoster Vaccines- Shingrix (1 of 2) Never done   Pneumonia Vaccine 68+ Years old (2 - PCV) 01/29/2007   INFLUENZA VACCINE  08/06/2020    There are no preventive care reminders to display for this patient.  Lab Results  Component Value Date   TSH 5.52 (H) 10/26/2020   Lab Results  Component Value Date   WBC 5.5 10/26/2020   HGB 16.2 10/26/2020   HCT 48.2 10/26/2020   MCV 96.7 10/26/2020   PLT 509.0 (H) 10/26/2020   Lab Results  Component Value Date   NA 141 10/26/2020   K 4.9 10/26/2020   CO2 29 10/26/2020   GLUCOSE 81 10/26/2020   BUN 23 10/26/2020   CREATININE 1.67 (H) 10/26/2020   BILITOT 0.4 10/26/2020   ALKPHOS 91 10/26/2020   AST 21 10/26/2020   ALT 15 10/26/2020   PROT 7.3 10/26/2020   ALBUMIN 4.1 10/26/2020   CALCIUM 9.8 10/26/2020   ANIONGAP 12 12/15/2019   GFR 40.50 (L) 10/26/2020   Lab Results  Component Value Date   CHOL 181 10/26/2020   Lab Results  Component Value Date   HDL 32.20 (L) 10/26/2020   Lab Results  Component Value Date   LDLCALC 61 03/02/2018   Lab Results  Component Value Date   TRIG 226.0 (H) 10/26/2020   Lab Results  Component Value Date   CHOLHDL 6 10/26/2020   Lab Results  Component Value Date   HGBA1C 5.4 10/26/2020      Assessment & Plan:   Problem List Items Addressed This Visit   None Visit Diagnoses     Paronychia of great toe of right foot    -  Primary   Relevant Medications   mupirocin cream (BACTROBAN) 2 %   Other Relevant Orders   Uric acid   Comprehensive metabolic panel   Vitamin D (25 hydroxy)   B12 and Folate Panel    Right foot pain       Relevant Medications   gabapentin (NEURONTIN) 100 MG capsule   traMADol (ULTRAM) 50 MG tablet   Other Relevant Orders   Uric acid   Comprehensive metabolic panel   Vitamin D (25 hydroxy)   B12 and Folate Panel   Pins and needles sensation       Relevant Medications   gabapentin (NEURONTIN) 100 MG capsule   Other Relevant Orders   Uric acid   Comprehensive metabolic panel   Vitamin D (25 hydroxy)   B12 and Folate Panel       Meds ordered this encounter  Medications   gabapentin (NEURONTIN) 100 MG capsule    Sig: Take 1 capsule (100 mg total) by mouth 3 (three) times daily.    Dispense:  90 capsule    Refill:  3    Order Specific Question:   Supervising Provider    Answer:   Gary [name], Gary [name] [[id number]]   DISCONTD: traMADol (ULTRAM) 50 MG tablet    Sig: Take 0.5-1 tablets (25-50 mg total) by mouth every 8 (eight) hours as needed for up to 5 days.    Dispense:  15 tablet    Refill:  0    Order Specific Question:   Supervising Provider    Answer:  Gary [name], Gary [name] [[id number]]   mupirocin cream (BACTROBAN) 2 %    Sig: Apply 1 application topically 2 (two) times daily.    Dispense:  15 g    Refill:  0    Order Specific Question:   Supervising Provider    Answer:   Gary [name], Gary [name] [[id number]]   traMADol (ULTRAM) 50 MG tablet    Sig: Take 0.5-1 tablets (25-50 mg total) by mouth every 8 (eight) hours as needed for up to 5 days.    Dispense:  15 tablet    Refill:  0    Order Specific Question:   Supervising Provider    Answer:   Gary [name], Gary [name] [[id number]]    Follow-up: Return if symptoms worsen or fail to improve.   PLAN Unclear etiology. Neuropathy vs gout vs underlying infection. Will get labs. Given gabapentin and tramadol for pain. Mupirocin in case of paronychia.  Will have low threshold to refer to podiatry for this Patient encouraged to call clinic with any questions, comments, or concerns.  Maximiano Coss, NP

## 2021-03-07 ENCOUNTER — Encounter: Payer: Self-pay | Admitting: Psychology
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
## 2021-03-07 NOTE — Telephone Encounter (Signed)
Patient would like to know is his glucose being really low a concern ? ?Is this glucose level cause for concern.  ?

## 2021-03-18 ENCOUNTER — Ambulatory Visit (INDEPENDENT_AMBULATORY_CARE_PROVIDER_SITE_OTHER): Payer: 59 | Admitting: Cardiology

## 2021-03-18 ENCOUNTER — Encounter (HOSPITAL_BASED_OUTPATIENT_CLINIC_OR_DEPARTMENT_OTHER): Payer: Self-pay | Admitting: Cardiology

## 2021-03-18 ENCOUNTER — Other Ambulatory Visit: Payer: Self-pay

## 2021-03-18 VITALS — BP 140/79 | HR 87 | Ht 70.0 in | Wt 163.2 lb

## 2021-03-18 DIAGNOSIS — I429 Cardiomyopathy, unspecified: Secondary | ICD-10-CM

## 2021-03-18 DIAGNOSIS — S069X9S Unspecified intracranial injury with loss of consciousness of unspecified duration, sequela: Secondary | ICD-10-CM | POA: Diagnosis not present

## 2021-03-18 DIAGNOSIS — Z7189 Other specified counseling: Secondary | ICD-10-CM | POA: Diagnosis not present

## 2021-03-18 DIAGNOSIS — N1832 Chronic kidney disease, stage 3b: Secondary | ICD-10-CM

## 2021-03-18 NOTE — Patient Instructions (Signed)
Medication Instructions:  ?Your Physician recommend you continue on your current medication as directed.   ? ?When we get your kidney function back, we will decide whether we will use an ARB or isosorbide/hydralazine for heart protection. In either case, we will repeat the echo in 3 mos and then meet to discuss the results. ? ?*If you need a refill on your cardiac medications before your next appointment, please call your pharmacy* ? ? ?Lab Work: ?None ordered today ? ? ?Testing/Procedures: ?Your physician has requested that you have an echocardiogram June, 2023. Echocardiography is a painless test that uses sound waves to create images of your heart. It provides your doctor with information about the size and shape of your heart and how well your heart?s chambers and valves are working. This procedure takes approximately one hour. There are no restrictions for this procedure. ?[place] ? ? ? ?Follow-Up: ?At [facility], you and your health needs are our priority.  As part of our continuing mission to provide you with exceptional heart care, we have created designated Provider Care Teams.  These Care Teams include your primary Cardiologist (physician) and Advanced Practice Providers (APPs -  Physician Assistants and Nurse Practitioners) who all work together to provide you with the care you need, when you need it. ? ?We recommend signing up for the patient portal called "MyChart".  Sign up information is provided on this After Visit Summary.  MyChart is used to connect with patients for Virtual Visits (Telemedicine).  Patients are able to view lab/test results, encounter notes, upcoming appointments, etc.  Non-urgent messages can be sent to your provider as well.   ?To learn more about what you can do with MyChart, go to [email or web address].   ? ?Your next appointment:   ?3 month(s) ? ?The format for your next appointment:   ?In Person ? ?Provider:   ?Buford Dresser, MD{ ? ? ?
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
## 2021-03-19 LAB — BASIC METABOLIC PANEL
BUN/Creatinine Ratio: 11 (ref 10–24)
BUN: 21 mg/dL (ref 8–27)
CO2: 23 mmol/L (ref 20–29)
Calcium: 9.6 mg/dL (ref 8.6–10.2)
Chloride: 103 mmol/L (ref 96–106)
Creatinine, Ser: 1.88 mg/dL — ABNORMAL HIGH (ref 0.76–1.27)
Glucose: 85 mg/dL (ref 70–99)
Potassium: 4.8 mmol/L (ref 3.5–5.2)
Sodium: 142 mmol/L (ref 134–144)
eGFR: 37 mL/min/{1.73_m2} — ABNORMAL LOW (ref >59)

## 2021-03-19 LAB — MAGNESIUM: Magnesium: 2.2 mg/dL (ref 1.6–2.3)

## 2021-03-20 ENCOUNTER — Other Ambulatory Visit: Payer: Self-pay | Admitting: Registered Nurse

## 2021-03-21 ENCOUNTER — Other Ambulatory Visit: Payer: Self-pay

## 2021-03-21 MED ORDER — FINASTERIDE 5 MG PO TABS
5.0000 mg | ORAL_TABLET | Freq: Every day | ORAL | 0 refills | Status: AC
Start: 2021-03-21 — End: ?
  Filled 2021-03-21: qty 30, 30d supply, fill #0

## 2021-04-08 ENCOUNTER — Encounter: Payer: Self-pay | Admitting: Registered Nurse

## 2021-04-11 ENCOUNTER — Other Ambulatory Visit: Payer: Self-pay | Admitting: Registered Nurse

## 2021-04-11 DIAGNOSIS — L03031 Cellulitis of right toe: Secondary | ICD-10-CM

## 2021-04-11 DIAGNOSIS — M79671 Pain in right foot: Secondary | ICD-10-CM

## 2021-04-11 NOTE — Progress Notes (Signed)
Podiatry referral placed after foot pain has not resolved. ? ?Kathrin Ruddy, NP ?

## 2021-04-15 ENCOUNTER — Encounter: Payer: No Typology Code available for payment source | Attending: Psychology | Admitting: Psychology

## 2021-04-15 DIAGNOSIS — F329 Major depressive disorder, single episode, unspecified: Secondary | ICD-10-CM | POA: Diagnosis present

## 2021-04-15 DIAGNOSIS — S069X4S Unspecified intracranial injury with loss of consciousness of 6 hours to 24 hours, sequela: Secondary | ICD-10-CM | POA: Insufficient documentation

## 2021-04-15 DIAGNOSIS — G44329 Chronic post-traumatic headache, not intractable: Secondary | ICD-10-CM | POA: Diagnosis present

## 2021-04-16 ENCOUNTER — Encounter: Payer: Self-pay | Admitting: Psychology
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
## 2021-04-21 ENCOUNTER — Encounter: Payer: Self-pay | Admitting: Emergency Medicine

## 2021-04-21 ENCOUNTER — Emergency Department (INDEPENDENT_AMBULATORY_CARE_PROVIDER_SITE_OTHER)
Admission: EM | Admit: 2021-04-21 | Discharge: 2021-04-21 | Disposition: A | Discharge status: Home / Self Care | Payer: 59 | Source: Home / Self Care | Attending: Family Medicine | Admitting: Family Medicine

## 2021-04-21 DIAGNOSIS — R0789 Other chest pain: Secondary | ICD-10-CM

## 2021-04-21 DIAGNOSIS — R062 Wheezing: Secondary | ICD-10-CM

## 2021-04-21 DIAGNOSIS — R051 Acute cough: Secondary | ICD-10-CM

## 2021-04-21 DIAGNOSIS — J3089 Other allergic rhinitis: Secondary | ICD-10-CM

## 2021-04-21 MED ORDER — PREDNISONE 20 MG PO TABS: ORAL_TABLET | ORAL | 0 refills | Status: DC

## 2021-04-21 MED ORDER — ALBUTEROL SULFATE HFA 108 (90 BASE) MCG/ACT IN AERS
2.0000 | INHALATION_SPRAY | Freq: Once | RESPIRATORY_TRACT | Status: AC
  Administered 2021-04-21: 2 via RESPIRATORY_TRACT

## 2021-04-21 NOTE — ED Triage Notes (Signed)
Wheezing since last night  ?Has seasonal allergies  ?Was outside yesterday  ?[place] at 1200 today  ?Here w/ wife & granddaughter  ?

## 2021-04-21 NOTE — Discharge Instructions (Signed)
Continue cetirizine for allergies ?Continue astelin for the runny nose ?Take the prednisone daily for 5 days ?Use albuterol inhaler as needed ?See your PCP if not improving by next week ?
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
## 2021-04-23 ENCOUNTER — Inpatient Hospital Stay (HOSPITAL_COMMUNITY): Payer: 59

## 2021-04-23 ENCOUNTER — Inpatient Hospital Stay (HOSPITAL_COMMUNITY)
Admission: EM | Admit: 2021-04-23 | Discharge: 2021-04-26 | DRG: 040 | Disposition: A | Discharge status: Rehabilitation Facility | Payer: 59 | Attending: Neurology | Admitting: Neurology

## 2021-04-23 ENCOUNTER — Emergency Department (HOSPITAL_COMMUNITY): Payer: 59

## 2021-04-23 ENCOUNTER — Encounter (HOSPITAL_COMMUNITY): Payer: Self-pay | Admitting: Student in an Organized Health Care Education/Training Program

## 2021-04-23 DIAGNOSIS — R29810 Facial weakness: Secondary | ICD-10-CM | POA: Diagnosis present

## 2021-04-23 DIAGNOSIS — D75839 Thrombocytosis, unspecified: Secondary | ICD-10-CM | POA: Diagnosis present

## 2021-04-23 DIAGNOSIS — I129 Hypertensive chronic kidney disease with stage 1 through stage 4 chronic kidney disease, or unspecified chronic kidney disease: Secondary | ICD-10-CM | POA: Diagnosis present

## 2021-04-23 DIAGNOSIS — G9340 Encephalopathy, unspecified: Secondary | ICD-10-CM | POA: Diagnosis present

## 2021-04-23 DIAGNOSIS — F909 Attention-deficit hyperactivity disorder, unspecified type: Secondary | ICD-10-CM | POA: Diagnosis present

## 2021-04-23 DIAGNOSIS — I6389 Other cerebral infarction: Secondary | ICD-10-CM | POA: Diagnosis not present

## 2021-04-23 DIAGNOSIS — R569 Unspecified convulsions: Secondary | ICD-10-CM | POA: Diagnosis present

## 2021-04-23 DIAGNOSIS — I5022 Chronic systolic (congestive) heart failure: Secondary | ICD-10-CM | POA: Diagnosis not present

## 2021-04-23 DIAGNOSIS — H532 Diplopia: Secondary | ICD-10-CM | POA: Diagnosis present

## 2021-04-23 DIAGNOSIS — Z87820 Personal history of traumatic brain injury: Secondary | ICD-10-CM | POA: Diagnosis not present

## 2021-04-23 DIAGNOSIS — N179 Acute kidney failure, unspecified: Secondary | ICD-10-CM | POA: Diagnosis present

## 2021-04-23 DIAGNOSIS — R4182 Altered mental status, unspecified: Secondary | ICD-10-CM | POA: Diagnosis not present

## 2021-04-23 DIAGNOSIS — J690 Pneumonitis due to inhalation of food and vomit: Secondary | ICD-10-CM

## 2021-04-23 DIAGNOSIS — J309 Allergic rhinitis, unspecified: Secondary | ICD-10-CM | POA: Diagnosis present

## 2021-04-23 DIAGNOSIS — E785 Hyperlipidemia, unspecified: Secondary | ICD-10-CM | POA: Diagnosis present

## 2021-04-23 DIAGNOSIS — K219 Gastro-esophageal reflux disease without esophagitis: Secondary | ICD-10-CM | POA: Diagnosis present

## 2021-04-23 DIAGNOSIS — Z87891 Personal history of nicotine dependence: Secondary | ICD-10-CM

## 2021-04-23 DIAGNOSIS — R4189 Other symptoms and signs involving cognitive functions and awareness: Secondary | ICD-10-CM | POA: Diagnosis present

## 2021-04-23 DIAGNOSIS — I5020 Unspecified systolic (congestive) heart failure: Secondary | ICD-10-CM

## 2021-04-23 DIAGNOSIS — I10 Essential (primary) hypertension: Secondary | ICD-10-CM | POA: Diagnosis not present

## 2021-04-23 DIAGNOSIS — R208 Other disturbances of skin sensation: Secondary | ICD-10-CM | POA: Diagnosis present

## 2021-04-23 DIAGNOSIS — R471 Dysarthria and anarthria: Secondary | ICD-10-CM | POA: Diagnosis present

## 2021-04-23 DIAGNOSIS — R2689 Other abnormalities of gait and mobility: Secondary | ICD-10-CM | POA: Diagnosis present

## 2021-04-23 DIAGNOSIS — N1832 Chronic kidney disease, stage 3b: Secondary | ICD-10-CM | POA: Diagnosis present

## 2021-04-23 DIAGNOSIS — E875 Hyperkalemia: Secondary | ICD-10-CM

## 2021-04-23 DIAGNOSIS — I63512 Cerebral infarction due to unspecified occlusion or stenosis of left middle cerebral artery: Principal | ICD-10-CM | POA: Diagnosis present

## 2021-04-23 DIAGNOSIS — I69320 Aphasia following cerebral infarction: Secondary | ICD-10-CM | POA: Diagnosis not present

## 2021-04-23 DIAGNOSIS — I69311 Memory deficit following cerebral infarction: Secondary | ICD-10-CM | POA: Diagnosis not present

## 2021-04-23 DIAGNOSIS — I130 Hypertensive heart and chronic kidney disease with heart failure and stage 1 through stage 4 chronic kidney disease, or unspecified chronic kidney disease: Secondary | ICD-10-CM | POA: Diagnosis present

## 2021-04-23 DIAGNOSIS — I69398 Other sequelae of cerebral infarction: Secondary | ICD-10-CM | POA: Diagnosis not present

## 2021-04-23 DIAGNOSIS — R270 Ataxia, unspecified: Secondary | ICD-10-CM | POA: Diagnosis present

## 2021-04-23 DIAGNOSIS — R29706 NIHSS score 6: Secondary | ICD-10-CM | POA: Diagnosis present

## 2021-04-23 DIAGNOSIS — I82532 Chronic embolism and thrombosis of left popliteal vein: Secondary | ICD-10-CM | POA: Diagnosis present

## 2021-04-23 DIAGNOSIS — R296 Repeated falls: Secondary | ICD-10-CM | POA: Diagnosis present

## 2021-04-23 DIAGNOSIS — I255 Ischemic cardiomyopathy: Secondary | ICD-10-CM | POA: Diagnosis present

## 2021-04-23 DIAGNOSIS — Z91013 Allergy to seafood: Secondary | ICD-10-CM | POA: Diagnosis not present

## 2021-04-23 DIAGNOSIS — Z823 Family history of stroke: Secondary | ICD-10-CM | POA: Diagnosis not present

## 2021-04-23 DIAGNOSIS — F09 Unspecified mental disorder due to known physiological condition: Secondary | ICD-10-CM

## 2021-04-23 DIAGNOSIS — F329 Major depressive disorder, single episode, unspecified: Secondary | ICD-10-CM | POA: Diagnosis present

## 2021-04-23 DIAGNOSIS — N183 Chronic kidney disease, stage 3 unspecified: Secondary | ICD-10-CM

## 2021-04-23 DIAGNOSIS — J9601 Acute respiratory failure with hypoxia: Secondary | ICD-10-CM

## 2021-04-23 DIAGNOSIS — N400 Enlarged prostate without lower urinary tract symptoms: Secondary | ICD-10-CM | POA: Diagnosis present

## 2021-04-23 DIAGNOSIS — K2100 Gastro-esophageal reflux disease with esophagitis, without bleeding: Secondary | ICD-10-CM | POA: Insufficient documentation

## 2021-04-23 DIAGNOSIS — G8191 Hemiplegia, unspecified affecting right dominant side: Secondary | ICD-10-CM | POA: Diagnosis present

## 2021-04-23 DIAGNOSIS — Z86718 Personal history of other venous thrombosis and embolism: Secondary | ICD-10-CM

## 2021-04-23 DIAGNOSIS — R4701 Aphasia: Secondary | ICD-10-CM | POA: Diagnosis present

## 2021-04-23 DIAGNOSIS — Z8249 Family history of ischemic heart disease and other diseases of the circulatory system: Secondary | ICD-10-CM | POA: Diagnosis not present

## 2021-04-23 DIAGNOSIS — G4700 Insomnia, unspecified: Secondary | ICD-10-CM | POA: Diagnosis present

## 2021-04-23 DIAGNOSIS — I639 Cerebral infarction, unspecified: Secondary | ICD-10-CM | POA: Diagnosis not present

## 2021-04-23 DIAGNOSIS — G9349 Other encephalopathy: Secondary | ICD-10-CM | POA: Diagnosis present

## 2021-04-23 DIAGNOSIS — Z881 Allergy status to other antibiotic agents status: Secondary | ICD-10-CM | POA: Diagnosis not present

## 2021-04-23 DIAGNOSIS — E440 Moderate protein-calorie malnutrition: Secondary | ICD-10-CM | POA: Diagnosis present

## 2021-04-23 DIAGNOSIS — Z87442 Personal history of urinary calculi: Secondary | ICD-10-CM

## 2021-04-23 DIAGNOSIS — F32A Depression, unspecified: Secondary | ICD-10-CM | POA: Diagnosis present

## 2021-04-23 DIAGNOSIS — I69322 Dysarthria following cerebral infarction: Secondary | ICD-10-CM | POA: Diagnosis not present

## 2021-04-23 DIAGNOSIS — M7989 Other specified soft tissue disorders: Secondary | ICD-10-CM | POA: Diagnosis not present

## 2021-04-23 DIAGNOSIS — G479 Sleep disorder, unspecified: Secondary | ICD-10-CM | POA: Diagnosis not present

## 2021-04-23 HISTORY — DX: Injury, unspecified, initial encounter: T14.90XA

## 2021-04-23 LAB — COMPREHENSIVE METABOLIC PANEL
ALT: 16 U/L (ref 0–44)
AST: 34 U/L (ref 15–41)
Albumin: 3.4 g/dL — ABNORMAL LOW (ref 3.5–5.0)
Alkaline Phosphatase: 82 U/L (ref 38–126)
Anion gap: 8 (ref 5–15)
BUN: 18 mg/dL (ref 8–23)
CO2: 23 mmol/L (ref 22–32)
Calcium: 9 mg/dL (ref 8.9–10.3)
Chloride: 108 mmol/L (ref 98–111)
Creatinine, Ser: 1.95 mg/dL — ABNORMAL HIGH (ref 0.61–1.24)
GFR, Estimated: 36 mL/min — ABNORMAL LOW (ref >60)
Glucose, Bld: 87 mg/dL (ref 70–99)
Potassium: 5.3 mmol/L — ABNORMAL HIGH (ref 3.5–5.1)
Sodium: 139 mmol/L (ref 135–145)
Total Bilirubin: 1 mg/dL (ref 0.3–1.2)
Total Protein: 6.7 g/dL (ref 6.5–8.1)

## 2021-04-23 LAB — LIPID PANEL
Cholesterol: 206 mg/dL — ABNORMAL HIGH (ref 0–200)
HDL: 32 mg/dL — ABNORMAL LOW (ref >40)
LDL Cholesterol: 136 mg/dL — ABNORMAL HIGH (ref 0–99)
Total CHOL/HDL Ratio: 6.4 RATIO
Triglycerides: 188 mg/dL — ABNORMAL HIGH (ref <150)
VLDL: 38 mg/dL (ref 0–40)

## 2021-04-23 LAB — I-STAT CHEM 8, ED
BUN: 26 mg/dL — ABNORMAL HIGH (ref 8–23)
Calcium, Ion: 0.85 mmol/L — CL (ref 1.15–1.40)
Chloride: 111 mmol/L (ref 98–111)
Creatinine, Ser: 2.1 mg/dL — ABNORMAL HIGH (ref 0.61–1.24)
Glucose, Bld: 82 mg/dL (ref 70–99)
HCT: 44 % (ref 39.0–52.0)
Hemoglobin: 15 g/dL (ref 13.0–17.0)
Potassium: 5.4 mmol/L — ABNORMAL HIGH (ref 3.5–5.1)
Sodium: 137 mmol/L (ref 135–145)
TCO2: 24 mmol/L (ref 22–32)

## 2021-04-23 LAB — BASIC METABOLIC PANEL
Anion gap: 11 (ref 5–15)
BUN: 18 mg/dL (ref 8–23)
CO2: 20 mmol/L — ABNORMAL LOW (ref 22–32)
Calcium: 9.1 mg/dL (ref 8.9–10.3)
Chloride: 110 mmol/L (ref 98–111)
Creatinine, Ser: 2 mg/dL — ABNORMAL HIGH (ref 0.61–1.24)
GFR, Estimated: 35 mL/min — ABNORMAL LOW (ref >60)
Glucose, Bld: 84 mg/dL (ref 70–99)
Potassium: 5.4 mmol/L — ABNORMAL HIGH (ref 3.5–5.1)
Sodium: 141 mmol/L (ref 135–145)

## 2021-04-23 LAB — CBC
HCT: 46.8 % (ref 39.0–52.0)
Hemoglobin: 15.6 g/dL (ref 13.0–17.0)
MCH: 29.8 pg (ref 26.0–34.0)
MCHC: 33.3 g/dL (ref 30.0–36.0)
MCV: 89.5 fL (ref 80.0–100.0)
Platelets: UNDETERMINED 10*3/uL (ref 150–400)
RBC: 5.23 MIL/uL (ref 4.22–5.81)
RDW: 14 % (ref 11.5–15.5)
WBC: 7.7 10*3/uL (ref 4.0–10.5)
nRBC: 0 % (ref 0.0–0.2)

## 2021-04-23 LAB — DIFFERENTIAL
Abs Immature Granulocytes: 0.1 10*3/uL — ABNORMAL HIGH (ref 0.00–0.07)
Basophils Absolute: 0.1 10*3/uL (ref 0.0–0.1)
Basophils Relative: 1 %
Eosinophils Absolute: 0.3 10*3/uL (ref 0.0–0.5)
Eosinophils Relative: 4 %
Immature Granulocytes: 1 %
Lymphocytes Relative: 13 %
Lymphs Abs: 1 10*3/uL (ref 0.7–4.0)
Monocytes Absolute: 0.6 10*3/uL (ref 0.1–1.0)
Monocytes Relative: 8 %
Neutro Abs: 5.6 10*3/uL (ref 1.7–7.7)
Neutrophils Relative %: 73 %

## 2021-04-23 LAB — PROTIME-INR
INR: 1.2 (ref 0.8–1.2)
Prothrombin Time: 15 seconds (ref 11.4–15.2)

## 2021-04-23 LAB — MAGNESIUM: Magnesium: 2.2 mg/dL (ref 1.7–2.4)

## 2021-04-23 LAB — MRSA NEXT GEN BY PCR, NASAL: MRSA by PCR Next Gen: NOT DETECTED

## 2021-04-23 LAB — HEMOGLOBIN A1C
Hgb A1c MFr Bld: 5.3 % (ref 4.8–5.6)
Mean Plasma Glucose: 105.41 mg/dL

## 2021-04-23 LAB — APTT: aPTT: 33 seconds (ref 24–36)

## 2021-04-23 LAB — CBG MONITORING, ED: Glucose-Capillary: 73 mg/dL (ref 70–99)

## 2021-04-23 MED ORDER — STROKE: EARLY STAGES OF RECOVERY BOOK
Freq: Once | Status: AC
  Filled 2021-04-23: qty 1

## 2021-04-23 MED ORDER — ACETAMINOPHEN 160 MG/5ML PO SOLN: 650.0000 mg | ORAL | Status: DC | PRN

## 2021-04-23 MED ORDER — LEVETIRACETAM IN NACL 500 MG/100ML IV SOLN
500.0000 mg | Freq: Two times a day (BID) | INTRAVENOUS | Status: DC
  Administered 2021-04-24 (×2): 500 mg via INTRAVENOUS
  Filled 2021-04-23 (×2): qty 100

## 2021-04-23 MED ORDER — SODIUM CHLORIDE 0.9 % IV SOLN: INTRAVENOUS | Status: DC

## 2021-04-23 MED ORDER — LEVETIRACETAM IN NACL 1000 MG/100ML IV SOLN
1000.0000 mg | INTRAVENOUS | Status: AC
  Administered 2021-04-23: 1000 mg via INTRAVENOUS
  Filled 2021-04-23: qty 100

## 2021-04-23 MED ORDER — SODIUM CHLORIDE 0.9% FLUSH
3.0000 mL | Freq: Once | INTRAVENOUS | Status: AC
  Administered 2021-04-23: 3 mL via INTRAVENOUS

## 2021-04-23 MED ORDER — IOHEXOL 350 MG/ML SOLN
60.0000 mL | Freq: Once | INTRAVENOUS | Status: AC | PRN
  Administered 2021-04-23: 60 mL via INTRAVENOUS

## 2021-04-23 MED ORDER — PANTOPRAZOLE SODIUM 40 MG IV SOLR
40.0000 mg | Freq: Every day | INTRAVENOUS | Status: DC
  Administered 2021-04-23: 40 mg via INTRAVENOUS
  Filled 2021-04-23: qty 10

## 2021-04-23 MED ORDER — MIDAZOLAM HCL 2 MG/2ML IJ SOLN
INTRAMUSCULAR | Status: AC
  Filled 2021-04-23: qty 2

## 2021-04-23 MED ORDER — ETOMIDATE 2 MG/ML IV SOLN
INTRAVENOUS | Status: AC
  Filled 2021-04-23: qty 20

## 2021-04-23 MED ORDER — ACETAMINOPHEN 325 MG PO TABS: 650.0000 mg | ORAL_TABLET | ORAL | Status: DC | PRN

## 2021-04-23 MED ORDER — ACETAMINOPHEN 650 MG RE SUPP: 650.0000 mg | RECTAL | Status: DC | PRN

## 2021-04-23 MED ORDER — ONDANSETRON HCL 4 MG/2ML IJ SOLN
4.0000 mg | Freq: Once | INTRAMUSCULAR | Status: AC
  Administered 2021-04-23: 4 mg via INTRAVENOUS
  Filled 2021-04-23: qty 2

## 2021-04-23 MED ORDER — SENNOSIDES-DOCUSATE SODIUM 8.6-50 MG PO TABS: 1.0000 | ORAL_TABLET | Freq: Every evening | ORAL | Status: DC | PRN

## 2021-04-23 MED ORDER — ROCURONIUM BROMIDE 10 MG/ML (PF) SYRINGE
PREFILLED_SYRINGE | INTRAVENOUS | Status: AC
  Filled 2021-04-23: qty 10

## 2021-04-23 MED ORDER — SODIUM CHLORIDE 0.9 % IV SOLN
3.0000 g | Freq: Three times a day (TID) | INTRAVENOUS | Status: DC
  Administered 2021-04-23 – 2021-04-26 (×8): 3 g via INTRAVENOUS
  Filled 2021-04-23 (×11): qty 8

## 2021-04-23 MED ORDER — IPRATROPIUM-ALBUTEROL 0.5-2.5 (3) MG/3ML IN SOLN: 3.0000 mL | Freq: Four times a day (QID) | RESPIRATORY_TRACT | Status: DC | PRN

## 2021-04-23 MED ORDER — CLEVIDIPINE BUTYRATE 0.5 MG/ML IV EMUL
0.0000 mg/h | INTRAVENOUS | Status: DC
  Administered 2021-04-23: 2 mg/h via INTRAVENOUS
  Filled 2021-04-23: qty 50

## 2021-04-23 MED ORDER — CHLORHEXIDINE GLUCONATE CLOTH 2 % EX PADS
6.0000 | MEDICATED_PAD | Freq: Every day | CUTANEOUS | Status: DC
  Administered 2021-04-24 – 2021-04-26 (×3): 6 via TOPICAL

## 2021-04-23 MED ORDER — LABETALOL HCL 5 MG/ML IV SOLN
20.0000 mg | Freq: Once | INTRAVENOUS | Status: AC
  Administered 2021-04-23: 20 mg via INTRAVENOUS
  Filled 2021-04-23: qty 4

## 2021-04-23 MED ORDER — TENECTEPLASE FOR STROKE
0.2500 mg/kg | PACK | Freq: Once | INTRAVENOUS | Status: AC
  Administered 2021-04-23: 19 mg via INTRAVENOUS
  Filled 2021-04-23: qty 10

## 2021-04-23 MED ORDER — FENTANYL CITRATE PF 50 MCG/ML IJ SOSY
PREFILLED_SYRINGE | INTRAMUSCULAR | Status: AC
  Filled 2021-04-23: qty 2

## 2021-04-23 NOTE — Progress Notes (Signed)
Patient with fluctuating symptoms including right sided weakness that is now mostly resolved, but worsening aphasia. The MRA earlier was suboptimal, and though his Cr is 2.0, at this point I think that the risk of missing an LVO is significant enough that It merits the risk of IV contrast.  ? ?1) CTA/P  ? ?Roland Rack, MD ?[facility] ?[phone] ? ?If 7pm- 7am, please page neurology on call as listed in [facility]. ? ?

## 2021-04-23 NOTE — Progress Notes (Signed)
RT called to patient's room due to respiratory distress. Patients sats 85%, patient has increased WOB and is diaphoretic.  Patient placed on NRB mask at 15L, 100%. Sats recovered to 93%.  CCM called. ?

## 2021-04-23 NOTE — Consult Note (Addendum)
? ?NAME:  Gary [name], MRN:  [id number], DOB:  01/12/1945, LOS: 0 ?ADMISSION DATE:  04/23/2021, CONSULTATION DATE:  04/23/21 ?REFERRING MD:  Rory Percy - neuro, CHIEF COMPLAINT:  Respiratory distress   ? ?History of Present Illness:  ?76 yo M PMH TBI, cognitive impairment HTN, CKD, DVT, prostate hypertrophy who was admitted to neuro service at [facility] 04/23/21 as code stroke after he was witnessed at home to be shaking with one arm extended. EMS was dispatched and noted R sided facial droop and aphasia. There were concerns for possible brain mets, so pt went for MRI brain -- which was motion degraded and showed possible punctate infarcts L MCA territory.  ?Without evidence of mets, received tnkase  ? ?PCCM was consulted emergently hours later for acute onset hypoxia and signs of distress. ? 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  ?DVT (off DOAC for about 3 months) ?HTN ?HLD ?Prostate hypertrophy  ?Possible Raynauds  ? ?Significant Hospital Events: ?Including procedures, antibiotic start and stop dates in addition to other pertinent events   ?4/18 admitted w/ new left MCA territory stroke. Got TNK at 1424. Developed significantly worse neuro exam and  acute respiratory distress w/ wheezing and pulse ox in 80s and increased WOB. He was placed on 100% NRB w/ improved saturations to 100% . Critical care arrived at bedside. Was awake, aphasic w/ right sided hemiparesis but appeared to be protecting airway at that point w/ no sig WOB. Was rushed to stat CT  ? ?Interim History / Subjective:  ?Awake interactive but aphasic  ? ?Objective   ?Blood pressure (Abnormal) 143/94, pulse (Abnormal) 107, temperature 97.8 ?F (36.6 ?C), temperature source Oral, resp. rate (Abnormal) 23, weight 74.8 kg, SpO2 93 %. ?   ?FiO2 (%):  [100 %] 100 %  ?No intake or output data in the 24 hours ending 04/23/21 1630 ?Filed Weights  ?  04/23/21 1300  ?Weight: 74.8 kg  ? ? ?Examination: ?General: 76 year old WM sitting up in bed. He is awake ?HENT: NCAT no JVD. MMM  ?Lungs: exp wheezing. Now on 100% NRB. No accessory use some upper airway rhonchi PCXR w/ diffuse R>L airspace disease.  ?Cardiovascular: RRR  ?Abdomen: soft  ?Extremities: warm dry pulses strong  ?Neuro: awake, globally aphasic. Right sided hemiparesis  ?GU: due to void  ? ?Resolved Hospital Problem list   ? ?Assessment & Plan:  ?Principal Problem: ?  Acute ischemic stroke ([place]) ?Active Problems: ?  CKD (chronic kidney disease) stage 3, GFR 30-59 ml/min (HCC) ?  GERD (gastroesophageal reflux disease) ?  Allergic rhinitis ?  Malnutrition of moderate degree ?  History of DVT (deep vein thrombosis) ?  Change in mental status ?  Systolic heart failure ([place]) ?  Acute respiratory failure with hypoxia ([place]) ?  Hyperkalemia ?  Aspiration pneumonia ([place]) ? ? ?Acute ischemic stroke left MCA area but scattered embolic and not LVO; concern would be in context of his underlying CM  ?S/p TNK 4/18  ?Plan ?Post stroke care per stroke team, starting secondary stroke prevention 4/19 ?Serial neuro checks ?Defer f/u echo and Carotid studies to stroke team  ? ?Rule out seizure: Sudden worsening mental status change w/ worsening hemiplegia.  ?CT was negative for worsening stroke. Now moving right side.  ?Plan ?EEG and LTM  ?Keppra load  ?Seizure precautions  ? ?Acute hypoxic respiratory failure. Highest on ddx would be aspiration  but also consider acute pulmonary edema or ALI ?Plan ?Stat CXR ?Supplemental oxygen  ?Pulse ox ?NPO ?Aspiration precautions ?Unasyn ?BNP and PCT am once we can get labs ? ?CKD stable 3b ?(Baseline cr 1.88 range) ?Plan ?Renal dose meds ?Serial chems ?Strict I&O ? ?Mild hyperkalemia ?Plan ?Repeat K later this afternoon. May need to give kayexalate, can't give treatment orally.  ? ?H/o HL ?Plan ?Statin to resume when able  ? ? ?Best Practice (right click and "Reselect all SmartList  Selections" daily)  ? ?Diet/type: NPO ?DVT prophylaxis: SCD ?GI prophylaxis: PPI ?Lines: N/A ?Foley:  N/A ?Code Status:  full code-->note had been DNR in past so need to re-engage.  ?Last date of multidisciplinary goals of care discussion [pending] ? ?Labs   ?CBC: ?Recent Labs  ?Lab 04/23/21 ?1335 04/23/21 ?1341  ?WBC 7.7  --   ?NEUTROABS 5.6  --   ?HGB 15.6 15.0  ?HCT 46.8 44.0  ?MCV 89.5  --   ?PLT PLATELET CLUMPS NOTED ON SMEAR, UNABLE TO ESTIMATE  --   ? ? ?Basic Metabolic Panel: ?Recent Labs  ?Lab 04/23/21 ?1341 04/23/21 ?1501  ?NA 137 139  ?K 5.4* 5.3*  ?CL 111 108  ?CO2  --  23  ?GLUCOSE 82 87  ?BUN 26* 18  ?CREATININE 2.10* 1.95*  ?CALCIUM  --  9.0  ? ?GFR: ?Estimated Creatinine Clearance: 34.8 mL/min (A) (by C-G formula based on SCr of 1.95 mg/dL (H)). ?Recent Labs  ?Lab 04/23/21 ?1335  ?WBC 7.7  ? ? ?Liver Function Tests: ?Recent Labs  ?Lab 04/23/21 ?1501  ?AST 34  ?ALT 16  ?ALKPHOS 82  ?BILITOT 1.0  ?PROT 6.7  ?ALBUMIN 3.4*  ? ?No results for input(s): LIPASE, AMYLASE in the last 168 hours. ?No results for input(s): AMMONIA in the last 168 hours. ? ?ABG ?   ?Component Value Date/Time  ? PHART 7.418 07/27/2019 0423  ? PCO2ART 28.6 (L) 07/27/2019 0423  ? PO2ART 131 (H) 07/27/2019 0423  ? HCO3 18.6 (L) 07/27/2019 0423  ? TCO2 24 04/23/2021 1341  ? ACIDBASEDEF 5.0 (H) 07/27/2019 0423  ? O2SAT 99.0 07/27/2019 0423  ?  ? ?Coagulation Profile: ?Recent Labs  ?Lab 04/23/21 ?1335  ?INR 1.2  ? ? ?Cardiac Enzymes: ?No results for input(s): CKTOTAL, CKMB, CKMBINDEX, TROPONINI in the last 168 hours. ? ?HbA1C: ?Hgb A1c MFr Bld  ?Date/Time Value Ref Range Status  ?04/23/2021 03:01 PM 5.3 4.8 - 5.6 % Final  ?  Comment:  ?  (NOTE) ?Pre diabetes:          5.7%-6.4% ? ?Diabetes:              >6.4% ? ?Glycemic control for   <7.0% ?adults with diabetes ?  ?10/26/2020 11:52 AM 5.4 4.6 - 6.5 % Final  ?  Comment:  ?  Glycemic Control Guidelines for People with Diabetes:Non Diabetic:  <6%Goal of Therapy: <7%Additional Action  Suggested:  >8%   ? ? ?CBG: ?Recent Labs  ?Lab 04/23/21 ?1335  ?GLUCAP 73  ? ? ?Review of Systems:   ?Not able  ? ?Past Medical History:  ?He,  has a past medical history of ADHD, Allergy, CKD (chronic kidney disease), GERD (gastroesophageal reflux disease), History of chickenpox, History of diverticulitis (2007), History of kidney stones, HTN (hypertension), Hypertension, Reflux, Renal disorder, TBI (traumatic brain injury) ([place]) (08/11/2019), TBI (traumatic brain injury) ([place]) (08/11/2019), and Trauma.  ? ?Surgical History:  ? ?Past Surgical History:  ?Procedure Laterality Date  ? MINOR REMOVAL OF MANDIBULAR HARDWARE N/A  12/15/2019  ? Procedure: REMOVAL OF RIGHT LATERAL ORBITAL MINIPLATE;  Surgeon: [name], DO;  Location: [place];  Service: Plastics;  Laterality: N/A;  ? ORIF MANDIBULAR FRACTURE Bilateral 07/27/2019  ? Procedure: OPEN REDUCTION INTERNAL FIXATION (ORIF) OF COMPLEX ZYGOMATIC FRACTURE;  Surgeon: [name], DO;  Location: [place];  Service: Plastics;  Laterality: Bilateral;  2 hours, please  ?  ? ?Social History:  ? reports that he has quit smoking. He has never used smokeless tobacco. He reports that he does not currently use alcohol. He reports that he does not use drugs.  ? ?Family History:  ?His family history includes Heart disease in his mother; Hypertension in his mother; Stroke in his mother.  ? ?Allergies ?Allergies  ?Allergen Reactions  ? Levaquin [Levofloxacin] Nausea And Vomiting  ? Shellfish Allergy Hives  ?  ? ?Home Medications  ?Prior to Admission medications   ?Medication Sig Start Date End Date Taking? Authorizing Provider  ?amphetamine-dextroamphetamine (ADDERALL XR) 20 MG 24 hr capsule Take 1 capsule (20 mg total) by mouth daily. 01/30/21 01/30/22  Meredith Staggers, MD  ?amphetamine-dextroamphetamine (ADDERALL) 10 MG tablet Take 1 tablet (10 mg total) by mouth 2 (two) times daily with a meal. 01/30/21 01/30/22  Meredith Staggers, MD  ?azelastine (ASTELIN) 0.1 % nasal  spray Place 1 spray into both nostrils 2 (two) times daily. Use in each nostril as directed 02/22/21   Maximiano Coss, NP  ?b complex vitamins capsule Take 1 capsule by mouth daily.    [provider]  ?buPROP

## 2021-04-23 NOTE — Code Documentation (Signed)
Stroke Response Nurse Documentation ?Code Documentation ? ?Gary [name] is a 76 y.o. male arriving to [facility]  via [facility] on 04/23/21 with past medical hx of TBI, HTN, CKD, GERD, prior DVT on eliquis stopped 48month ago. On No antithrombotic. Code stroke was activated by EMS.  ? ?Patient from home where he was 1215 when was sitting with caregiver and suddenly he started shaking and spilling peanuts in hand. Caregiver called wife into room and they noticed right sided weakness and aphasia.  ? ?Stroke team at the bedside on patient arrival. Labs drawn and patient cleared for CT by Dr. SAshok Cordia Patient to CT with team. NIHSS 6, see documentation for details and code stroke times. Patient with disoriented, right facial droop, right arm weakness, and dysarthria  on exam.  ? ?The following imaging was completed:  CT Head. Patient is a candidate for IV Thrombolytic due to stroke. Patient is not not a candidate for IR due to mRS.  ? ?Patient taken to MRI for treatment decision. TNK given 1424 while in MRI. ? ?Care Plan: mNIHSS and vitals q124m x2h, [id number], q1h x 16h.  ? ?Patient transported to ICU.   ? ?Gary NickelsStroke Response RN ? ? ?

## 2021-04-23 NOTE — Procedures (Signed)
Patient Name: Gary [name]  ?MRN: [id number]  ?Epilepsy Attending: Lora Havens  ?Referring Physician/Provider: Amie Portland, MD ?Date: 04/06/2021 ?Duration: 21.51 mins ? ?Patient history: 76 y.o. male with PMH TBI, HTN, HLD, CKD, ?ischemic cardiomyopathy EF 40-45% (12/2020), DVT completed xarelto, MDD, kidney stones, cognitive impairment, gait instability, enlarged prostate presetned with right sided weakness and aphasia due to h/o shaking. EEG to evaluate for seizure. ? ?Level of alertness: Awake ? ?AEDs during EEG study: LEV ? ?Technical aspects: This EEG study was done with scalp electrodes positioned according to the 10-20 International system of electrode placement. Electrical activity was acquired at a sampling rate of '500Hz'$  and reviewed with a high frequency filter of '70Hz'$  and a low frequency filter of '1Hz'$ . EEG data were recorded continuously and digitally stored.  ? ?Description: The posterior dominant rhythm consists of 8-9 Hz activity of moderate voltage (25-35 uV) seen predominantly in posterior head regions, symmetric and reactive to eye opening and eye closing. EEG showed intermittent generalized 3 to 6 Hz theta-delta slowing. Hyperventilation and photic stimulation were not performed.    ? ?ABNORMALITY ?- Intermittent slow, generalized ? ?IMPRESSION: ?This study is suggestive of mild diffuse encephalopathy, nonspecific etiology. No seizures or epileptiform discharges were seen throughout the recording. ? ? ?Gary [name] [name]  ? ?

## 2021-04-23 NOTE — H&P (Addendum)
NEUROLOGY H&P NOTE  ? ?Date of service: April 23, 2021 ?Patient Name: Gary [name] ?MRN:  [id number] ?DOB:  1945-03-22 ?Reason for consult: "code stroke v seizure" ?Requesting Provider: Lajean Saver, MD ? ?History of Present Illness  ?Saharsh Sterling is a 76 y.o. male with PMH TBI, HTN, HLD, CKD, ?ischemic cardiomyopathy EF 40-45% (12/2020), DVT completed xarelto, MDD, kidney stones, cognitive impairment, gait instability, enlarged prostate. ? ?History per wife:  ?Patient was at home with sitter when sitter noted patient was sitting with one arm extended and shaking to empty can of peanuts then shaking all over. She noticed that patient was confused and called EMS.  ?EMS noted aphasia and right sided facial droop.  ?Shaking was learned later on.  ? ?Required sitter because of cognitive declines and frequent falls after TBI >1 year ago. ?He was on xarelto for DVT for prolonged hospitalization that was discontinued about 2-3 months ago.  ? ?Image from 2021 showed small subdural.  ? ?LKN: 1230 on 12/23/2021 ?NIHSS: 6 (04/18 1345)  ?tNK: Yes @ 1425 ? ? ? ? ?ROS  ? ?Constitutional Denies fever and chills.   ?Respiratory Denies SOB and cough.   ?CV Palpitations, dizziness  ?GI Denies abdominal pain, nausea, vomiting and diarrhea.   ?MSK Denies myalgia and joint pain.   ?Neurological Headache  ?Psychiatric depression  ? ?Past History  ? ?Past Medical History:  ?Diagnosis Date  ?? ADHD   ?? Allergy   ?? CKD (chronic kidney disease)   ?? GERD (gastroesophageal reflux disease)   ?? History of chickenpox   ?? History of diverticulitis 2007  ?? History of kidney stones   ?? HTN (hypertension)   ?? Hypertension   ?? Reflux   ?? Renal disorder   ? kidney stones  ? ?Past Surgical History:  ?Procedure Laterality Date  ?? MINOR REMOVAL OF MANDIBULAR HARDWARE N/A 12/15/2019  ? Procedure: REMOVAL OF RIGHT LATERAL ORBITAL MINIPLATE;  Surgeon: [name], DO;  Location: [place];  Service: Plastics;  Laterality: N/A;  ?? ORIF  MANDIBULAR FRACTURE Bilateral 07/27/2019  ? Procedure: OPEN REDUCTION INTERNAL FIXATION (ORIF) OF COMPLEX ZYGOMATIC FRACTURE;  Surgeon: [name], DO;  Location: [place];  Service: Plastics;  Laterality: Bilateral;  2 hours, please  ? ?Family History  ?Problem Relation Age of Onset  ?? Heart disease Mother   ?? Hypertension Mother   ?? Stroke Mother   ? ?Social History  ? ?Socioeconomic History  ?? Marital status: Married  ?  Spouse name: Squire Withey  ?? Number of children: Not on file  ?? Years of education: Not on file  ?? Highest education level: Not on file  ?Occupational History  ?? Occupation: Warden/ranger  ?  Comment: [employer]  ?Tobacco Use  ?? Smoking status: Former  ?? Smokeless tobacco: Never  ?Vaping Use  ?? Vaping Use: Never used  ?Substance and Sexual Activity  ?? Alcohol use: Not Currently  ?? Drug use: Never  ?? Sexual activity: Yes  ?Other Topics Concern  ?? Not on file  ?Social History Narrative  ? ** Merged History Encounter **  ?    ? ?Social Determinants of Health  ? ?Financial Resource Strain: Not on file  ?Food Insecurity: Not on file  ?Transportation Needs: Not on file  ?Physical Activity: Not on file  ?Stress: Not on file  ?Social Connections: Not on file  ? ?Allergies  ?Allergen Reactions  ?? Levaquin [Levofloxacin] Nausea And Vomiting  ?? Shellfish Allergy Hives  ? ? ?  Medications  ?(Not in a hospital admission) ?  ? ?Vitals  ? ?Vitals:  ? 04/23/21 1300 04/23/21 1348  ?BP:  (!) 158/93  ?Pulse:  86  ?SpO2:  94%  ?Weight: 74.8 kg   ?  ? ?Body mass index is 23.66 kg/m?. ? ?Physical Exam  ?General: Laying comfortably in bed; in no acute distress.  ?Pulmonary: Symmetric chest rise. Non-labored respiratory effort.  ?Ext: No cyanosis, edema, or deformity  ?Musculoskeletal: Normal digits and nails by inspection. No clubbing.  ? ?Neurologic Examination  ?Mental status/Cognition:  ?Alert. Oriented to self, hospital. ?Speech/language:  ?Fluent, thought content appropriate.  Dysarthria  ?Comprehension intact-able to follow 3 step commands without difficulty. ?Object naming intact.   ?No neglect.  ? ?Cranial Nerves: ?II: No VF deficits.  ?III,IV, VI: extra-ocular motions intact bilaterally. pupils equal, round, reactive to light and accommodation, no gaze preference or deviation.  ?V: facial light touch sensation normal bilaterally ?VII: right facial droop ?VIII: hearing normal bilaterally ?IX,X: cough and gag intact. ?XI: bilateral shoulder shrug and head turn ?XII: midline tongue extension ?Motor: ?R  UE 3+/5 LE 5/5  ?L UE 3+/5 LE 5/5  ?No tremors ? ?Sensory: Pinprick and light touch intact throughout bilaterally ? ?Deep Tendon Reflexes: 2+ and symmetric throughout ? ?Coordination/Complex Motor:  ?Ataxic   ? ?Labs  ? ?CBC:  ?Recent Labs  ?Lab 04/23/21 ?1341  ?HGB 15.0  ?HCT 44.0  ? ? ?Basic Metabolic Panel:  ?Lab Results  ?Component Value Date  ? NA 137 04/23/2021  ? K 5.4 (H) 04/23/2021  ? CO2 23 03/18/2021  ? GLUCOSE 82 04/23/2021  ? BUN 26 (H) 04/23/2021  ? CREATININE 2.10 (H) 04/23/2021  ? CALCIUM 9.6 03/18/2021  ? GFRNONAA 40 (L) 12/15/2019  ? GFRAA 49 (L) 09/03/2019  ? ?Lipid Panel:  ?Lab Results  ?Component Value Date  ? [place] 61 03/02/2018  ? ?HgbA1c:  ?Lab Results  ?Component Value Date  ? HGBA1C 5.4 10/26/2020  ? ?CT Head without contrast: 04/23/2021 ?There is no acute intracranial hemorrhage or evidence of acute ?infarction. ASPECT score is 10. ? ?MR Angio head without contrast: 04/23/2021 ?Suboptimal evaluation due to motion degradation. ?Few possible punctate acute infarcts in the left frontal low within ?the MCA territory. ?No proximal intracranial vessel occlusion. M2 MCA segments are not ?well evaluated. ? ?MRI Brain: 04/23/2021 ?Motion artifact is present. ?Brain: Seen only on the coronal DWI sequence, there are a few ?possible small foci of mildly reduced diffusion in the left frontal ?lobe within the MCA territory. No corresponding T2 hyperintensity, ?noting that the  T2 FLAIR sequence utilized together with motion ?artifact results in limited contrast resolution. There is no ?intracranial mass or mass effect. Thin subdural collections on the ?prior study have resolved. There is no hydrocephalus. ?Vascular: Major vessel flow voids at the skull base are preserved. ?Skull and upper cervical spine: Marrow signal is grossly within ?normal limits. ?Sinuses/Orbits: Nonspecific left maxillary sinus mucosal thickening ?and small air-fluid level. Orbits are unremarkable. ?Other: Mastoid air cells are clear. ? ?Impression  ?Ermias Tomeo is a 76 y.o. male with PMH TBI, HTN, HLD, CKD, ?ischemic cardiomyopathy EF 40-45% (12/2020), DVT completed xarelto, MDD, kidney stones, cognitive impairment, gait instability, enlarged prostate presetned with right sided weakness and aphasia due to h/o shaking and somewhat puzzling exam, took him to MRI to confirm stroke.  ? ?MRI showed scattered left sided hyperintensities suspected to be cardioembolic stroke in the setting of cardiomyopathy. Unclear if patient has prostatic cancer that would  be concerning for mets. Patient was also seen shaking at home, possible consideration of provoked seizure from CVA.  ?Received tNK after discussion for risk and benefits with wife.  ? ?No antithrombic or anticoagulation at home, no longer on statin, no BP meds currently.  ? ?Recommendations  ?- Stroke team to follow  ? ?Plan: ?Acute Ischemic Stroke ?Acuity: Acute ?Current Suspected Etiology: cardioembolic vs mets ?Continue Evaluation:  ?-Admit to: ICU ?-Hold Aspirin until 24 hour post IV thrombolysis (TNKase) neuroimaging is stable and without evidence of bleeding ?-Blood pressure control, goal of SYS <180 ?-MRI/ECHO/A1C/Lipid panel. LE doppler due to h/o DVT-wife also curious about whether strokes are related to TBI which was related to work-related injury and he has a Workmen's Compensation case. ?-Hyperglycemia management per SSI to maintain glucose  140-'180mg'$ /dL. ?-PT/OT/ST therapies and recommendations when able ? ?Dysarthria ?-NPO until cleared by speech ?-Advance diet as tolerated ? ?Hemiplegia and hemiparesis following cerebral infarction affecting righ

## 2021-04-23 NOTE — Code Documentation (Signed)
Stroke Response Nurse Documentation ?Code Documentation ? ?Patient began wheezing approximately 1515. Wife at bedside stated patient seen in urgent care on Monday for wheezing and given inhaler for allergies. MD paged for neb treatment. RN stepped out of room 1520.  ?1525 RN returned, patient with increased work of breathing, diaphoretic, increased wheezing and rhonchus. 2nd RN and RT called to room. Patient placed on NRB from room air. Simultaneously RN noticed patient not moving right side at this time and worsened speech. MD paged for neuro change. CCM Jerrye Bushy NP consulted and to bedside. Patient to stat CT. Neuro MD Rory Percy to bedside. EEG ordered. ? ?Newman Nickels  ?Stroke Response RN ? ? ?

## 2021-04-23 NOTE — Progress Notes (Signed)
PHARMACIST CODE STROKE RESPONSE ? ?Notified to mix TNK at 1420 by Dr. Rory Percy ?Delivered TNK to RN at 1424 ? ?TNK dose = 19 mg IV over 5 seconds ? ?Issues/delays encountered (if applicable): patient with complicated history of TBI and prostate cancer. Concerns for seizures in postictal state due to caregiver stating patient was shaking prior to EMS arrival. Also concerns for potential metastases to brain. This led to patient going to MRI prior to decision to give TNK.  ? ?Zenaida Deed, PharmD ?PGY1 Acute Care Pharmacy Resident  ?Phone: [phone] ?04/23/2021  2:28 PM ? ?Please check [email or web address] for unit-specific pharmacy phone numbers. ? ? ?

## 2021-04-23 NOTE — Progress Notes (Signed)
Pharmacy Antibiotic Note ? ?Oakley Kossman is a 76 y.o. male admitted on 04/23/2021 with aspiration pneumonia.  Pharmacy has been consulted for Unasyn dosing. ? ?CrCl 35 ml/min ? ?Plan: ?Unasyn 3gm q8hr ?Will monitor for acute changes in renal function and adjust as needed ?F/u cultures results and de-escalate as appropriate ? ?Weight: 74.8 kg (164 lb 14.5 oz) ? ?Temp (24hrs), Avg:97.8 ?F (36.6 ?C), Min:97.8 ?F (36.6 ?C), Max:97.8 ?F (36.6 ?C) ? ?Recent Labs  ?Lab 04/23/21 ?1335 04/23/21 ?1341 04/23/21 ?1501  ?WBC 7.7  --   --   ?CREATININE  --  2.10* 1.95*  ?  ?Estimated Creatinine Clearance: 34.8 mL/min (A) (by C-G formula based on SCr of 1.95 mg/dL (H)).   ? ?Allergies  ?Allergen Reactions  ? Levaquin [Levofloxacin] Nausea And Vomiting  ? Shellfish Allergy Hives  ? ? ?Thank you for allowing pharmacy to be a part of this patient?s care. ? ?Donnald Garre, PharmD ?Clinical Pharmacist ? ?Please check AMION for all [place] numbers ?After 10:00 PM, call [place] [phone] ? ? ?

## 2021-04-23 NOTE — Progress Notes (Signed)
EEG completed, results pending. 

## 2021-04-23 NOTE — Progress Notes (Signed)
LTM EEG hooked up and running - no initial skin breakdown - push button tested - neuro notified. [facility] monitoring.  

## 2021-04-23 NOTE — ED Provider Notes (Signed)
?[place] ?Provider Note ? ? ?CSN: [id number] ?Arrival date & time: 04/23/21  1332 ? ?An emergency department physician performed an initial assessment on this suspected stroke patient at 86. ? ?History ? ?Chief Complaint  ?Patient presents with  ? Stroke Symptoms  ? ? ?Gary [name] is a 76 y.o. male. ? ?Pt presents via EMS as code stroke activation with spouse noting altered mental status, slurred speech and weakness at ~ 12 noon today. Pt is limited historian - level 5 caveat - pt denies specific physical c/o. No headache. No numbness/weakness. Pt afebrile. No c/o of pain/injury.  ? ?The history is provided by the patient, the EMS personnel and medical records. The history is limited by the condition of the patient.  ? ?  ? ?Home Medications ?Prior to Admission medications   ?Medication Sig Start Date End Date Taking? Authorizing Provider  ?amphetamine-dextroamphetamine (ADDERALL XR) 20 MG 24 hr capsule Take 1 capsule (20 mg total) by mouth daily. 01/30/21 01/30/22  Meredith Staggers, MD  ?amphetamine-dextroamphetamine (ADDERALL) 10 MG tablet Take 1 tablet (10 mg total) by mouth 2 (two) times daily with a meal. 01/30/21 01/30/22  Meredith Staggers, MD  ?azelastine (ASTELIN) 0.1 % nasal spray Place 1 spray into both nostrils 2 (two) times daily. Use in each nostril as directed 02/22/21   Maximiano Coss, NP  ?b complex vitamins capsule Take 1 capsule by mouth daily.    [provider]  ?buPROPion (WELLBUTRIN XL) 300 MG 24 hr tablet TAKE 1 TABLET(300 MG) BY MOUTH DAILY 03/04/21   Meredith Staggers, MD  ?CALCIUM PO Take 1 tablet by mouth daily.    [provider]  ?cetirizine (ZYRTEC) 10 MG tablet Take 1 tablet (10 mg total) by mouth daily. 02/22/21   Maximiano Coss, NP  ?finasteride (PROSCAR) 5 MG tablet Take 1 tablet (5 mg total) by mouth daily. 03/21/21   Maximiano Coss, NP  ?gabapentin (NEURONTIN) 100 MG capsule Take 1 capsule (100 mg total) by mouth 3  (three) times daily. 03/05/21   Maximiano Coss, NP  ?Multiple Vitamin (MULTIVITAMIN WITH MINERALS) TABS tablet Take 1 tablet by mouth daily.    [provider]  ?predniSONE (DELTASONE) 20 MG tablet Take 2 tablets (40 mg) daily for 5 days 04/21/21   Raylene Everts, MD  ?vitamin B-12 (CYANOCOBALAMIN) 500 MCG tablet TAKE TWO TABLETS BY MOUTH ONCE A DAY 11/19/17   [provider]  ?vitamin C (ASCORBIC ACID) 500 MG tablet Take 500 mg by mouth daily.    [provider]  ?Vitamin D, Cholecalciferol, 1000 units CAPS Take 1 capsule by mouth daily. ?Patient taking differently: Take 1,000 Units by mouth daily. 03/18/17   Brunetta Jeans, PA-C  ?   ? ?Allergies    ?Levaquin [levofloxacin] and Shellfish allergy   ? ?Review of Systems   ?Review of Systems  ?Constitutional:  Negative for fever.  ?HENT:  Negative for trouble swallowing.   ?Eyes:  Negative for redness.  ?Respiratory:  Negative for shortness of breath.   ?Cardiovascular:  Negative for chest pain.  ?Gastrointestinal:  Negative for vomiting.  ?Genitourinary:  Negative for dysuria and flank pain.  ?Musculoskeletal:  Negative for back pain and neck pain.  ?Skin:  Negative for rash.  ?Neurological:  Negative for headaches.  ?Hematological:  Does not bruise/bleed easily.  ?Psychiatric/Behavioral:  Positive for confusion.   ? ?Physical Exam ?Updated Vital Signs ?BP (!) 157/90 (BP Location: Left Arm)   Pulse 86  Wt 74.8 kg   SpO2 94%   BMI 23.66 kg/m?  ?Physical Exam ?Vitals and nursing note reviewed.  ?Constitutional:   ?   Appearance: Normal appearance. He is well-developed.  ?HENT:  ?   Head: Atraumatic.  ?   Nose: Nose normal.  ?   Mouth/Throat:  ?   Mouth: Mucous membranes are moist.  ?Eyes:  ?   General: No scleral icterus. ?   Conjunctiva/sclera: Conjunctivae normal.  ?   Pupils: Pupils are equal, round, and reactive to light.  ?Neck:  ?   Trachea: No tracheal deviation.  ?Cardiovascular:  ?   Rate and Rhythm: Normal rate and  regular rhythm.  ?   Pulses: Normal pulses.  ?   Heart sounds: Normal heart sounds. No murmur heard. ?  No friction rub. No gallop.  ?Pulmonary:  ?   Effort: Pulmonary effort is normal. No accessory muscle usage or respiratory distress.  ?   Breath sounds: Normal breath sounds.  ?Abdominal:  ?   General: Bowel sounds are normal. There is no distension.  ?   Palpations: Abdomen is soft.  ?   Tenderness: There is no abdominal tenderness.  ?Genitourinary: ?   Comments: No cva tenderness. ?Musculoskeletal:     ?   General: No swelling.  ?   Cervical back: Normal range of motion and neck supple. No rigidity.  ?Skin: ?   General: Skin is warm and dry.  ?   Findings: No rash.  ?Neurological:  ?   Mental Status: He is alert.  ?   Comments: Alert, ?mild dysarthric quality to speech.  Motor/sens grossly intact bil.   ?Psychiatric:     ?   Mood and Affect: Mood normal.  ? ? ?ED Results / Procedures / Treatments   ?Labs ?(all labs ordered are listed, but only abnormal results are displayed) ?Results for orders placed or performed during the hospital encounter of 04/23/21  ?Protime-INR  ?Result Value Ref Range  ? Prothrombin Time 15.0 11.4 - 15.2 seconds  ? INR 1.2 0.8 - 1.2  ?APTT  ?Result Value Ref Range  ? aPTT 33 24 - 36 seconds  ?I-stat chem 8, ED  ?Result Value Ref Range  ? Sodium 137 135 - 145 mmol/L  ? Potassium 5.4 (H) 3.5 - 5.1 mmol/L  ? Chloride 111 98 - 111 mmol/L  ? BUN 26 (H) 8 - 23 mg/dL  ? Creatinine, Ser 2.10 (H) 0.61 - 1.24 mg/dL  ? Glucose, Bld 82 70 - 99 mg/dL  ? Calcium, Ion 0.85 (LL) 1.15 - 1.40 mmol/L  ? TCO2 24 22 - 32 mmol/L  ? Hemoglobin 15.0 13.0 - 17.0 g/dL  ? HCT 44.0 39.0 - 52.0 %  ? Comment NOTIFIED PHYSICIAN   ?CBG monitoring, ED  ?Result Value Ref Range  ? Glucose-Capillary 73 70 - 99 mg/dL  ? ? ? ?EKG ?None ? ?Radiology ?CT HEAD CODE STROKE WO CONTRAST ? ?Result Date: 04/23/2021 ?CLINICAL DATA:  Code stroke.  Neuro deficit, acute, stroke suspected EXAM: CT HEAD WITHOUT CONTRAST TECHNIQUE:  Contiguous axial images were obtained from the base of the skull through the vertex without intravenous contrast. RADIATION DOSE REDUCTION: This exam was performed according to the departmental dose-optimization program which includes automated exposure control, adjustment of the mA and/or kV according to patient size and/or use of iterative reconstruction technique. COMPARISON:  08/06/2019 FINDINGS: Brain: No acute intracranial hemorrhage, mass effect, or edema. Gray-white differentiation is preserved. Ventricles and sulci are normal in  size and configuration. No extra-axial collection. Vascular: No hyperdense vessel. Skull: Unremarkable. Sinuses/Orbits: Left maxillary sinus mucosal thickening with nonspecific small air-fluid level. Plate and screw fixation of the right maxilla. Orbits are unremarkable. Other: Mastoid air cells are clear. ASPECTS ([place] Stroke Program Early CT Score) - Ganglionic level infarction (caudate, lentiform nuclei, internal capsule, insula, M1-M3 cortex): 7 - Supraganglionic infarction (M4-M6 cortex): 3 Total score (0-10 with 10 being normal): 10 IMPRESSION: There is no acute intracranial hemorrhage or evidence of acute infarction. ASPECT score is 10. These results were communicated to Dr. Rory Percy at 1:45 pm on 04/23/2021 by text page via the [facility] messaging system. Electronically Signed   By: Macy Mis M.D.   On: 04/23/2021 13:54   ? ?Procedures ?Procedures  ? ? ?Medications Ordered in ED ?Medications  ?sodium chloride flush (NS) 0.9 % injection 3 mL (has no administration in time range)  ? stroke: early stages of recovery book (has no administration in time range)  ?0.9 %  sodium chloride infusion (has no administration in time range)  ?acetaminophen (TYLENOL) tablet 650 mg (has no administration in time range)  ?  Or  ?acetaminophen (TYLENOL) 160 MG/5ML solution 650 mg (has no administration in time range)  ?  Or  ?acetaminophen (TYLENOL) suppository 650 mg (has no administration in  time range)  ?senna-docusate (Senokot-S) tablet 1 tablet (has no administration in time range)  ?pantoprazole (PROTONIX) injection 40 mg (has no administration in time range)  ?labetalol (NORMODYNE) injection 20 mg (has

## 2021-04-23 NOTE — Progress Notes (Signed)
Came bedside for echo, but EEG was setting up in room.   ?

## 2021-04-24 ENCOUNTER — Ambulatory Visit: Payer: 59 | Admitting: Podiatry

## 2021-04-24 ENCOUNTER — Inpatient Hospital Stay (HOSPITAL_COMMUNITY): Payer: 59

## 2021-04-24 DIAGNOSIS — J9601 Acute respiratory failure with hypoxia: Secondary | ICD-10-CM | POA: Diagnosis not present

## 2021-04-24 DIAGNOSIS — M7989 Other specified soft tissue disorders: Secondary | ICD-10-CM | POA: Diagnosis not present

## 2021-04-24 DIAGNOSIS — J690 Pneumonitis due to inhalation of food and vomit: Secondary | ICD-10-CM | POA: Diagnosis not present

## 2021-04-24 DIAGNOSIS — I639 Cerebral infarction, unspecified: Secondary | ICD-10-CM | POA: Diagnosis not present

## 2021-04-24 DIAGNOSIS — I6389 Other cerebral infarction: Secondary | ICD-10-CM | POA: Diagnosis not present

## 2021-04-24 DIAGNOSIS — N1832 Chronic kidney disease, stage 3b: Secondary | ICD-10-CM | POA: Diagnosis not present

## 2021-04-24 DIAGNOSIS — Z86718 Personal history of other venous thrombosis and embolism: Secondary | ICD-10-CM | POA: Diagnosis not present

## 2021-04-24 DIAGNOSIS — I5022 Chronic systolic (congestive) heart failure: Secondary | ICD-10-CM

## 2021-04-24 LAB — LIPID PANEL
Cholesterol: 189 mg/dL (ref 0–200)
HDL: 30 mg/dL — ABNORMAL LOW
LDL Cholesterol: 119 mg/dL — ABNORMAL HIGH (ref 0–99)
Total CHOL/HDL Ratio: 6.3 ratio
Triglycerides: 199 mg/dL — ABNORMAL HIGH
VLDL: 40 mg/dL (ref 0–40)

## 2021-04-24 LAB — CBC
HCT: 47.7 % (ref 39.0–52.0)
Hemoglobin: 15.9 g/dL (ref 13.0–17.0)
MCH: 29.1 pg (ref 26.0–34.0)
MCHC: 33.3 g/dL (ref 30.0–36.0)
MCV: 87.2 fL (ref 80.0–100.0)
Platelets: 447 10*3/uL — ABNORMAL HIGH (ref 150–400)
RBC: 5.47 MIL/uL (ref 4.22–5.81)
RDW: 13.8 % (ref 11.5–15.5)
WBC: 9.5 10*3/uL (ref 4.0–10.5)
nRBC: 0 % (ref 0.0–0.2)

## 2021-04-24 LAB — ECHOCARDIOGRAM COMPLETE
AR max vel: 2.9 cm2
AV Area VTI: 2.79 cm2
AV Area mean vel: 2.72 cm2
AV Mean grad: 2 mmHg
AV Peak grad: 3.8 mmHg
Ao pk vel: 0.97 m/s
Area-P 1/2: 3.21 cm2
P 1/2 time: 546 msec
S' Lateral: 4.2 cm
Weight: 2638.47 oz

## 2021-04-24 LAB — COMPREHENSIVE METABOLIC PANEL
ALT: 15 U/L (ref 0–44)
AST: 28 U/L (ref 15–41)
Albumin: 3.2 g/dL — ABNORMAL LOW (ref 3.5–5.0)
Alkaline Phosphatase: 87 U/L (ref 38–126)
Anion gap: 8 (ref 5–15)
BUN: 13 mg/dL (ref 8–23)
CO2: 22 mmol/L (ref 22–32)
Calcium: 8.7 mg/dL — ABNORMAL LOW (ref 8.9–10.3)
Chloride: 109 mmol/L (ref 98–111)
Creatinine, Ser: 1.88 mg/dL — ABNORMAL HIGH (ref 0.61–1.24)
GFR, Estimated: 37 mL/min — ABNORMAL LOW (ref >60)
Glucose, Bld: 79 mg/dL (ref 70–99)
Potassium: 4 mmol/L (ref 3.5–5.1)
Sodium: 139 mmol/L (ref 135–145)
Total Bilirubin: 0.9 mg/dL (ref 0.3–1.2)
Total Protein: 6.4 g/dL — ABNORMAL LOW (ref 6.5–8.1)

## 2021-04-24 LAB — MAGNESIUM: Magnesium: 2 mg/dL (ref 1.7–2.4)

## 2021-04-24 LAB — PHOSPHORUS: Phosphorus: 3.5 mg/dL (ref 2.5–4.6)

## 2021-04-24 MED ORDER — FINASTERIDE 5 MG PO TABS
5.0000 mg | ORAL_TABLET | Freq: Every day | ORAL | Status: DC
  Administered 2021-04-24 – 2021-04-26 (×3): 5 mg via ORAL
  Filled 2021-04-24 (×3): qty 1

## 2021-04-24 MED ORDER — BUPROPION HCL ER (XL) 150 MG PO TB24
300.0000 mg | ORAL_TABLET | Freq: Every day | ORAL | Status: DC
  Administered 2021-04-24 – 2021-04-26 (×3): 300 mg via ORAL
  Filled 2021-04-24: qty 1
  Filled 2021-04-24: qty 2
  Filled 2021-04-24: qty 1

## 2021-04-24 MED ORDER — ROSUVASTATIN CALCIUM 20 MG PO TABS
20.0000 mg | ORAL_TABLET | Freq: Every day | ORAL | Status: DC
  Administered 2021-04-24 – 2021-04-26 (×3): 20 mg via ORAL
  Filled 2021-04-24 (×2): qty 1

## 2021-04-24 MED ORDER — LEVETIRACETAM 500 MG PO TABS
500.0000 mg | ORAL_TABLET | Freq: Two times a day (BID) | ORAL | Status: DC
  Administered 2021-04-25 – 2021-04-26 (×3): 500 mg via ORAL
  Filled 2021-04-24 (×3): qty 1

## 2021-04-24 MED ORDER — AZELASTINE HCL 0.1 % NA SOLN
1.0000 | Freq: Two times a day (BID) | NASAL | Status: DC
  Administered 2021-04-25 – 2021-04-26 (×2): 1 via NASAL
  Filled 2021-04-24 (×2): qty 30

## 2021-04-24 MED ORDER — CLOPIDOGREL BISULFATE 75 MG PO TABS
75.0000 mg | ORAL_TABLET | Freq: Every day | ORAL | Status: DC
  Administered 2021-04-25 – 2021-04-26 (×2): 75 mg via ORAL
  Filled 2021-04-24 (×2): qty 1

## 2021-04-24 MED ORDER — ASPIRIN EC 81 MG PO TBEC
81.0000 mg | DELAYED_RELEASE_TABLET | Freq: Every day | ORAL | Status: DC
  Administered 2021-04-25 – 2021-04-26 (×2): 81 mg via ORAL
  Filled 2021-04-24 (×2): qty 1

## 2021-04-24 MED ORDER — ADULT MULTIVITAMIN W/MINERALS CH
1.0000 | ORAL_TABLET | Freq: Every day | ORAL | Status: DC
  Administered 2021-04-24 – 2021-04-26 (×3): 1 via ORAL
  Filled 2021-04-24 (×3): qty 1

## 2021-04-24 MED ORDER — VITAMIN D 25 MCG (1000 UNIT) PO TABS
1000.0000 [IU] | ORAL_TABLET | Freq: Every day | ORAL | Status: DC
  Administered 2021-04-24 – 2021-04-26 (×3): 1000 [IU] via ORAL
  Filled 2021-04-24 (×3): qty 1

## 2021-04-24 MED ORDER — PANTOPRAZOLE SODIUM 40 MG PO TBEC
40.0000 mg | DELAYED_RELEASE_TABLET | Freq: Every day | ORAL | Status: DC
  Administered 2021-04-24 – 2021-04-26 (×3): 40 mg via ORAL
  Filled 2021-04-24 (×3): qty 1

## 2021-04-24 MED ORDER — LEVETIRACETAM 500 MG PO TABS: 500.0000 mg | ORAL_TABLET | Freq: Two times a day (BID) | ORAL | Status: DC

## 2021-04-24 MED ORDER — AMPHETAMINE-DEXTROAMPHET ER 10 MG PO CP24
20.0000 mg | ORAL_CAPSULE | Freq: Every day | ORAL | Status: DC
  Administered 2021-04-25 – 2021-04-26 (×2): 20 mg via ORAL
  Filled 2021-04-24: qty 2
  Filled 2021-04-24: qty 1
  Filled 2021-04-24: qty 2

## 2021-04-24 NOTE — Evaluation (Signed)
Physical Therapy Evaluation ?Patient Details ?Name: Gary [name] ?MRN: [id number] ?DOB: 08-16-1945 ?Today's Date: 04/24/2021 ? ?History of Present Illness ? 76 y.o. male [facility] hospital on [facility]/23/2021 after episode of UE shaking and confusion. EMS noted aphasia and R facial droop. MRI showed scattered left sided hyperintensities suspected to be cardioembolic stroke. Pt received tNK. EEG negative for seizure. 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  ?Clinical Impression ? Pt presents to PT with deficits in functional mobility, gait, balance, power, endurance, cognition, awareness. Pt with slowed processing and impaired awareness of balance deficits at this time. Pt benefits from UE support and PT assistance to maintain balance and reduce risk for falls. PT recommends AIR admission at this time as the pt demonstrates the potential to return to independent mobility and he has has success in an AIR program previously. ?   ? ?Recommendations for follow up therapy are one component of a multi-disciplinary discharge planning process, led by the attending physician.  Recommendations may be updated based on patient status, additional functional criteria and insurance authorization. ? ?Follow Up Recommendations Acute inpatient rehab (3hours/day) ? ?  ?Assistance Recommended at Discharge Frequent or constant Supervision/Assistance  ?Patient can return home with the following ? A lot of help with walking and/or transfers;A lot of help with bathing/dressing/bathroom;Assistance with cooking/housework;Assistance with feeding;Direct supervision/assist for medications management;Direct supervision/assist for financial management;Assist for transportation;Help with stairs or ramp for entrance ? ?  ?Equipment Recommendations None recommended by PT  ?Recommendations for Other Services ? Rehab consult  ?  ?Functional Status Assessment Patient has  had a recent decline in their functional status and demonstrates the ability to make significant improvements in function in a reasonable and predictable amount of time.  ? ?  ?Precautions / Restrictions Precautions ?Precautions: Fall ?Restrictions ?Weight Bearing Restrictions: No  ? ?  ? ?Mobility ? Bed Mobility ?Overal bed mobility: Needs Assistance ?Bed Mobility: Supine to Sit ?  ?  ?Supine to sit: Min guard ?  ?  ?General bed mobility comments: increased time, multiple verbal cues ?  ? ?Transfers ?Overall transfer level: Needs assistance ?Equipment used: 1 person hand held assist ?Transfers: Sit to/from Stand ?Sit to Stand: Min assist ?  ?  ?  ?  ?  ?  ?  ? ?Ambulation/Gait ?Ambulation/Gait assistance: Mod assist ?Gait Distance (Feet): 40 Feet ?Assistive device: 1 person hand held assist ?Gait Pattern/deviations: Step-through pattern, Drifts right/left ?Gait velocity: reduced ?Gait velocity interpretation: <1.31 ft/sec, indicative of household ambulator ?  ?General Gait Details: pt with slowed step-through gait, increased lateral drift with greater R drift than left. Requires assistance from PT to maintain balance, especially with turns ? ?Stairs ?  ?  ?  ?  ?  ? ?Wheelchair Mobility ?  ? ?Modified Rankin (Stroke Patients Only) ?Modified Rankin (Stroke Patients Only) ?Pre-Morbid Rankin Score: Moderate disability ?Modified Rankin: Moderately severe disability ? ?  ? ?Balance Overall balance assessment: Needs assistance ?Sitting-balance support: No upper extremity supported, Feet supported ?Sitting balance-Leahy Scale: Poor ?Sitting balance - Comments: minG-minA with attempts to don socks ?  ?Standing balance support: Single extremity supported ?Standing balance-Leahy Scale: Poor ?Standing balance comment: reliant on UE support and minG-minA ?  ?  ?  ?  ?  ?  ?  ?  ?  ?  ?  ?   ? ? ? ?Pertinent Vitals/Pain Pain Assessment ?Pain Assessment: No/denies pain  ? ? ?Home Living Family/patient expects  to be discharged  to:: Private residence ?Living Arrangements: Spouse/significant other ?Available Help at Discharge: Family;Available PRN/intermittently;Personal care attendant (PCA 12 hours daily, spouse present all other hours) ?Type of Home: House ?Home Access: Stairs to enter ?Entrance Stairs-Rails: None ?Entrance Stairs-Number of Steps: 2 ?Alternate Level Stairs-Number of Steps: flight ?Home Layout: Two level ?Home Equipment: Conservation officer, nature (2 wheels);Cane - single point;Shower seat;BSC/3in1 ?   ?  ?Prior Function Prior Level of Function : Needs assist ?  ?  ?  ?  ?  ?  ?Mobility Comments: ambulates with cane in the community, no device within the home ?ADLs Comments: pt requires assistance with transportation ?  ? ? ?Hand Dominance  ? Dominant Hand: Right ? ?  ?Extremity/Trunk Assessment  ? Upper Extremity Assessment ?Upper Extremity Assessment: Defer to OT evaluation ?  ? ?Lower Extremity Assessment ?Lower Extremity Assessment: Generalized weakness ?  ? ?Cervical / Trunk Assessment ?Cervical / Trunk Assessment: Normal  ?Communication  ? Communication: Expressive difficulties;Receptive difficulties (difficulty following commands to touch nose)  ?Cognition Arousal/Alertness: Awake/alert ?Behavior During Therapy: Flat affect ?Overall Cognitive Status: History of cognitive impairments - at baseline ?  ?  ?  ?  ?  ?  ?  ?  ?  ?  ?  ?  ?  ?  ?  ?  ?General Comments: prior TBI, oriented currently to person, place, situation, but not time. Difficulty following multi-step commands, increased processing time ?  ?  ? ?  ?General Comments General comments (skin integrity, edema, etc.): pt with chronic double vision since TBI, VSS on RA ? ?  ?Exercises    ? ?Assessment/Plan  ?  ?PT Assessment Patient needs continued PT services  ?PT Problem List Decreased strength;Decreased activity tolerance;Decreased balance;Decreased mobility;Decreased cognition;Decreased coordination;Decreased knowledge of precautions;Decreased safety  awareness;Decreased knowledge of use of DME ? ?   ?  ?PT Treatment Interventions DME instruction;Gait training;Stair training;Functional mobility training;Therapeutic activities;Therapeutic exercise;Balance training;Neuromuscular re-education;Cognitive remediation;Patient/family education   ? ?PT Goals (Current goals can be found in the Care Plan section)  ?Acute Rehab PT Goals ?Patient Stated Goal: to return to prior level of function and independent mobility ?PT Goal Formulation: With patient/family ?Time For Goal Achievement: 05/08/21 ?Potential to Achieve Goals: Fair ? ?  ?Frequency Min 3X/week ?  ? ? ?Co-evaluation   ?  ?  ?  ?  ? ? ?  ?AM-PAC PT "6 Clicks" Mobility  ?Outcome Measure Help needed turning from your back to your side while in a flat bed without using bedrails?: A Little ?Help needed moving from lying on your back to sitting on the side of a flat bed without using bedrails?: A Little ?Help needed moving to and from a bed to a chair (including a wheelchair)?: A Little ?Help needed standing up from a chair using your arms (e.g., wheelchair or bedside chair)?: A Little ?Help needed to walk in hospital room?: A Lot ?Help needed climbing 3-5 steps with a railing? : Total ?6 Click Score: 15 ? ?  ?End of Session   ?Activity Tolerance: Patient tolerated treatment well ?Patient left: in chair;with call bell/phone within reach;with chair alarm set ?Nurse Communication: Mobility status ?PT Visit Diagnosis: Other abnormalities of gait and mobility (R26.89);Muscle weakness (generalized) (M62.81);Other symptoms and signs involving the nervous system (R29.898) ?  ? ?Time: 2248-2500 ?PT Time Calculation (min) (ACUTE ONLY): 25 min ? ? ?Charges:   PT Evaluation ?$PT Eval Moderate Complexity: 1 Mod ?  ?  ?   ? ? ?Lillia Carmel  Truman Hayward, PT, DPT ?Acute Rehabilitation ?Pager: [phone] ?Office [phone] ? ? ?Zenaida Niece ?04/24/2021, 2:51 PM ? ?

## 2021-04-24 NOTE — Progress Notes (Signed)
EEG maint complete.  ?

## 2021-04-24 NOTE — Progress Notes (Signed)
OT Cancellation Note ? ?Patient Details ?Name: Gary [name] ?MRN: [id number] ?DOB: [birth year] ? ? ?Cancelled Treatment:    Reason Eval/Treat Not Completed: Active bedrest order (OT evaluation to f/u after updated activity orders) ? ?Gary [name] ?04/24/2021, 7:43 AM ?
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
## 2021-04-24 NOTE — Progress Notes (Signed)
Inpatient Rehab Admissions Coordinator Note:  ? ?Per therapy recommendations patient was screened for CIR candidacy by Michel Santee, PT. At this time, pt appears to be a potential candidate for CIR. I will place an order for rehab consult for full assessment, per our protocol.  Please contact me any with questions.. ? ?Shann Medal, PT, DPT ?[phone] ?04/24/21 ?4:19 PM  ?
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
## 2021-04-24 NOTE — TOC CAGE-AID Note (Addendum)
Transition of Care (TOC) - CAGE-AID Screening ? ? ?Patient Details  ?Name: Gary [name] ?MRN: [id number] ?Date of Birth: 1945/11/06 ? ?Transition of Care (TOC) CM/SW Contact:    ?Shatona Andujar C Tarpley-Carter, LCSWA ?Phone Number: ?04/24/2021, 10:44 AM ? ? ?Clinical Narrative: ?Pt participated in [facility].  Pts wife stated he does not use substance or ETOH.  Pt was not offered resources, due to no usage of substance or ETOH.    ? ?Passenger transport manager, MSW, LCSW-A ?Pronouns:  She/Her/Hers ?[facility] ?Clinical Social Worker ?Direct Number:  [phone] ?[email or web address]  ? ?CAGE-AID Screening: ?  ? ?Have You Ever Felt You Ought to Cut Down on Your Drinking or Drug Use?: No ?Have People Annoyed You By Critizing Your Drinking Or Drug Use?: No ?Have You Felt Bad Or Guilty About Your Drinking Or Drug Use?: No ?Have You Ever Had a Drink or Used Drugs First Thing In The Morning to Steady Your Nerves or to Get Rid of a Hangover?: No ?CAGE-AID Score: 0 ? ?Substance Abuse Education Offered: No ? ?  ? ? ? ? ? ? ?

## 2021-04-24 NOTE — Progress Notes (Signed)
Echocardiogram ?2D Echocardiogram has been performed. ? ?Arlyss Gandy ?04/24/2021, 11:08 AM ?

## 2021-04-24 NOTE — Progress Notes (Signed)
?  Transition of Care (TOC) Screening Note ? ? ?Patient Details  ?Name: Gary [name] ?Date of Birth: 06/17/1945 ? ? ?Transition of Care (TOC) CM/SW Contact:    ?Benard Halsted, LCSW ?Phone Number: ?04/24/2021, 6:03 PM ? ? ? ?Transition of Care Department [facility]) has reviewed patient. We will continue to monitor patient advancement through interdisciplinary progression rounds. If new patient transition needs arise, please place a TOC consult. ? ? ?

## 2021-04-24 NOTE — Progress Notes (Addendum)
? ?NAME:  Gary [name], MRN:  [id number], DOB:  11-14-1945, LOS: 1 ?ADMISSION DATE:  04/23/2021, CONSULTATION DATE:  04/23/21 ?REFERRING MD:  Rory Percy - neuro, CHIEF COMPLAINT:  Respiratory distress   ? ?History of Present Illness:  ?76 yo M PMH TBI, cognitive impairment HTN, CKD, DVT, prostate hypertrophy who was admitted to neuro service at [facility] 04/23/21 as code stroke after he was witnessed at home to be shaking with one arm extended. EMS was dispatched and noted R sided facial droop and aphasia. There were concerns for possible brain mets, so pt went for MRI brain -- which was motion degraded and showed possible punctate infarcts L MCA territory.  ?Without evidence of mets, received tnkase. ? ?PCCM was consulted emergently hours later for acute onset hypoxia and signs of distress. ? ?Pertinent  Medical History  ?Frequent falls ?Systolic CM w/ EF 74-08% ?CKD 3b ?GERD ?Allergies  ?TBI w/ residual cognitive impairment and unstable gait.  ?DVT (off DOAC for about 3 months) ?HTN ?HLD ?Prostate hypertrophy  ?Possible Raynauds  ? ?Significant Hospital Events: ?Including procedures, antibiotic start and stop dates in addition to other pertinent events   ?4/18 admitted w/ new left MCA territory stroke. Got TNK at 1424. Developed significantly worse neuro exam and  acute respiratory distress w/ wheezing and pulse ox in 80s and increased WOB. He was placed on 100% NRB w/ improved saturations to 100% . Critical care arrived at bedside. Was awake, aphasic w/ right sided hemiparesis but appeared to be protecting airway at that point w/ no sig WOB. Was rushed to stat CT.  ?4/19 improved respiratory status. Down to 2L.  ? ?Interim History / Subjective:  ?No acute events overnight.  ? ?Objective   ?Blood pressure 137/76, pulse 84, temperature 98 ?F (36.7 ?C), temperature source Oral, resp. rate 19, weight 74.8 kg, SpO2 (!) 84 %. ?   ?FiO2 (%):  [100 %] 100 %  ? ?Intake/Output Summary (Last 24 hours) at 04/24/2021 0802 ?Last data  filed at 04/24/2021 0600 ?Gross per 24 hour  ?Intake 423.79 ml  ?Output 1550 ml  ?Net -1126.21 ml  ? ?Filed Weights  ? 04/23/21 1300  ?Weight: 74.8 kg  ? ? ?Examination: ?General: elderly appearing male in NAD ?HENT: [place]/AT, PERRL, no JVD ?Lungs: Clear bilateral breath sounds.  ?Cardiovascular: RRR, no MRG. No edema.  ?Abdomen: Soft, non-tender, non-distended ?Extremities: no acute deformity.  ?Neuro: awake, answering some questions. Remains aphasic. ? ?Resolved Hospital Problem list   ? ?Assessment & Plan:  ? ?Acute ischemic stroke left MCA area but scattered embolic and not LVO; concern would be in context of his underlying CM  ?S/p TNK 4/18  ?Plan ?Post stroke care per stroke team ?Serial neuro checks ?Echo, DVT study pending ? ?Rule out seizure: Sudden worsening mental status change w/ worsening hemiplegia.  ?CT was negative for worsening stroke. Now moving right side.  ?Plan ?EEG ongoing ?AED per neurology ?Seizure precautions  ? ?Acute hypoxic respiratory failure. Highest on ddx would be aspiration but also consider acute pulmonary edema or ALI ?Plan ?Supplemental oxygen for sat goal 90%. Currently on 2L can likely be weaned down. ?SLP eval ?Aspiration precautions. ?Unasyn continue for 5 day course.  ?Incentive spirometry. ?OOB when cleared by neuro. ?Glad he is doing better. Looks like he will avoid intubation barring any set-back. ? ?CKD stable 3b ?(Baseline cr 1.88 range) ?Plan ?Renal dose meds ?Serial chems ?Strict I&O ? ?Mild hyperkalemia> improved ?Plan ?Monitor ? ? ?Best Practice (right click and "Reselect all  SmartList Selections" daily)  ? ?Diet/type: NPO ?DVT prophylaxis: SCD ?GI prophylaxis: PPI ?Lines: N/A ?Foley:  N/A ?Code Status:  full code-->note had been DNR in past so need to re-engage.  ?Last date of multidisciplinary goals of care discussion [pending]. ? ? ? ?Critical care time:  ?  ? ? ?Georgann Housekeeper, AGACNP-BC ?[facility] ? ?See Amion for personal pager ?PCCM on call  pager [phone] until 7pm ?Please call Elink 7p-7a. [phone] ? ?04/24/2021 8:20 AM ? ? ? ?Attending Note:  ?I have examined patient, reviewed labs, studies and notes.  ? ?Interval events: ?-Improved respiratory status overnight, oxygen weaned to 2 L/min ?-Repeat CT brain, angio was normal.  Angiography showed no emergent large vessel occlusion or high-grade stenoses.  Note was made of small bilateral pleural effusions. ?-CXR reviewed by me > improving B infiltrates compared with 4/18 ? ?Vitals:  ? 04/24/21 0600 04/24/21 0700 04/24/21 0757 04/24/21 0800  ?BP: (!) 145/80 137/76  (!) 147/73  ?Pulse:  84 79 79  ?Resp: '16 19 14 20  '$ ?Temp:   98 ?F (36.7 ?C)   ?TempSrc:   Oral   ?SpO2:  (!) 84% 97% 96%  ?Weight:      ?Chronically ill-appearing man, no distress.  He is awake, interacts and able to answer questions.  Improved mental status compared with 4/18 although he does still have some word finding difficulty.  Scattered bilateral inspiratory crackles, no wheezes, improved.  Heart regular without a murmur.  Abdomen benign.  No significant edema. ? ?Hypoxemic respiratory failure with bilateral pulmonary infiltrates.  Question whether this could have been aspiration pneumonitis due to her impaired airway protection from his stroke.  Rapid clearance of his chest x-ray would argue for pneumonitis or pulmonary edema as opposed to an overt pneumonia.  Continue Unasyn for 5-day course.  Push pulmonary hygiene.  Suspect we can wean his oxygen to off.  Follow intermittent chest x-ray for clearance.  Agree with echocardiogram to rule out systolic or diastolic CHF. ? ?Acute ischemic left MCA CVA without large vessel occlusion.  Neurology managing, echocardiogram, lower extremity Doppler study pending. ? ?Possible seizures.  Continuous EEG running.  AED as per neurology plans.  Seizure precautions in place. ? ?CKD stage IIIb.  Following urine output, BMP.  Hyperkalemia is improved. ? ?PCCM will sign off, will be available  if needed. Please call.  ? ? ?Baltazar Apo, MD, PhD ?04/24/2021, 10:22 AM ?[facility] ?[phone] or if no answer [phone] ? ? ? ? ? ? ?

## 2021-04-24 NOTE — Progress Notes (Signed)
Bilateral lower extremity venous duplex completed. ?Refer to "CV Proc" under chart review to view preliminary results. ? ?04/24/2021 11:25 AM ?Kelby Aline., MHA, RVT, RDCS, RDMS   ?

## 2021-04-24 NOTE — Progress Notes (Addendum)
STROKE TEAM PROGRESS NOTE  ? ?SUBJECTIVE (INTERVAL HISTORY) ?His RN is at the bedside.  Overall his condition is rapidly improving. Per RN, pt this morning still has some encephalopathy on waking up, however now fully orientated and seems at baseline.  Patient still has long-term EEG which showed no seizures.   ? ?OBJECTIVE ?Temp:  [97.8 ?F (36.6 ?C)-98.3 ?F (36.8 ?C)] 98.3 ?F (36.8 ?C) (04/19 1600) ?Pulse Rate:  [76-92] 76 (04/19 1800) ?Resp:  [13-21] 18 (04/19 1800) ?BP: (116-156)/(47-104) 129/62 (04/19 1800) ?SpO2:  [84 %-99 %] 95 % (04/19 1800) ? ?Recent Labs  ?Lab 04/23/21 ?1335  ?GLUCAP 73  ? ?Recent Labs  ?Lab 04/23/21 ?1341 04/23/21 ?1501 04/24/21 ?0414  ?NA 137 141  139 139  ?K 5.4* 5.4*  5.3* 4.0  ?CL 111 110  108 109  ?CO2  --  20*  23 22  ?GLUCOSE 82 84  87 79  ?BUN 26* '18  18 13  '$ ?CREATININE 2.10* 2.00*  1.95* 1.88*  ?CALCIUM  --  9.1  9.0 8.7*  ?MG  --  2.2 2.0  ?PHOS  --   --  3.5  ? ?Recent Labs  ?Lab 04/23/21 ?1501 04/24/21 ?0414  ?AST 34 28  ?ALT 16 15  ?ALKPHOS 82 87  ?BILITOT 1.0 0.9  ?PROT 6.7 6.4*  ?ALBUMIN 3.4* 3.2*  ? ?Recent Labs  ?Lab 04/23/21 ?1335 04/23/21 ?1341 04/24/21 ?0414  ?WBC 7.7  --  9.5  ?NEUTROABS 5.6  --   --   ?HGB 15.6 15.0 15.9  ?HCT 46.8 44.0 47.7  ?MCV 89.5  --  87.2  ?PLT PLATELET CLUMPS NOTED ON SMEAR, UNABLE TO ESTIMATE  --  447*  ? ?No results for input(s): CKTOTAL, CKMB, CKMBINDEX, TROPONINI in the last 168 hours. ?Recent Labs  ?  04/23/21 ?1335  ?LABPROT 15.0  ?INR 1.2  ? ?No results for input(s): COLORURINE, LABSPEC, [place], GLUCOSEU, HGBUR, BILIRUBINUR, KETONESUR, PROTEINUR, UROBILINOGEN, NITRITE, LEUKOCYTESUR in the last 72 hours. ? ?Invalid input(s): APPERANCEUR  ?   ?Component Value Date/Time  ? CHOL 189 04/24/2021 0414  ? TRIG 199 (H) 04/24/2021 0414  ? HDL 30 (L) 04/24/2021 0414  ? CHOLHDL 6.3 04/24/2021 0414  ? VLDL 40 04/24/2021 0414  ? [place] 119 (H) 04/24/2021 0414  ? ?Lab Results  ?Component Value Date  ? HGBA1C 5.3 04/23/2021  ? ?No results  found for: LABOPIA, COCAINSCRNUR, [place], [place], [place], [place]  ?No results for input(s): ETH in the last 168 hours. ? ?I have personally reviewed the radiological images below and agree with the radiology interpretations. ? ?CT HEAD WO CONTRAST (5MM) ? ?Result Date: 04/23/2021 ?CLINICAL DATA:  Stroke-like symptoms EXAM: CT HEAD WITHOUT CONTRAST TECHNIQUE: Contiguous axial images were obtained from the base of the skull through the vertex without intravenous contrast. RADIATION DOSE REDUCTION: This exam was performed according to the departmental dose-optimization program which includes automated exposure control, adjustment of the mA and/or kV according to patient size and/or use of iterative reconstruction technique. COMPARISON:  None. FINDINGS: Brain: There is no mass, hemorrhage or extra-axial collection. The size and configuration of the ventricles and extra-axial CSF spaces are normal. The brain parenchyma is normal, without acute or chronic infarction. Vascular: No abnormal hyperdensity of the major intracranial arteries or dural venous sinuses. No intracranial atherosclerosis. Skull: The visualized skull base, calvarium and extracranial soft tissues are normal. Sinuses/Orbits: No fluid levels or advanced mucosal thickening of the visualized paranasal sinuses. No mastoid or middle ear effusion. The orbits are normal.  IMPRESSION: Normal head CT. Electronically Signed   By: Ulyses Jarred M.D.   On: 04/23/2021 22:16  ? ?CT HEAD WO CONTRAST (5MM) ? ?Result Date: 04/23/2021 ?CLINICAL DATA:  Mental status change.  Unknown cause. EXAM: CT HEAD WITHOUT CONTRAST TECHNIQUE: Contiguous axial images were obtained from the base of the skull through the vertex without intravenous contrast. RADIATION DOSE REDUCTION: This exam was performed according to the departmental dose-optimization program which includes automated exposure control, adjustment of the mA and/or kV according to patient size and/or use of iterative  reconstruction technique. COMPARISON:  04/23/2021 FINDINGS: Brain: No evidence of acute infarction, hemorrhage, hydrocephalus, extra-axial collection or mass lesion/mass effect. Vascular: No hyperdense vessel or unexpected calcification. Skull: No osseous abnormality. Sinuses/Orbits: Visualized paranasal sinuses are clear. Visualized mastoid sinuses are clear. Visualized orbits demonstrate no focal abnormality. Other: None IMPRESSION: 1. No acute intracranial findings. Electronically Signed   By: Kathreen Devoid M.D.   On: 04/23/2021 16:03  ? ?MR ANGIO HEAD WO CONTRAST ? ?Result Date: 04/23/2021 ?CLINICAL DATA:  Stroke, hemorrhagic; Stroke/TIA, determine embolic source EXAM: MRI HEAD WITHOUT CONTRAST MRA HEAD WITHOUT CONTRAST TECHNIQUE: Multiplanar, multi-echo pulse sequences of the brain and surrounding structures were acquired without intravenous contrast. Angiographic images of the Circle of Willis were acquired using MRA technique without intravenous contrast. COMPARISON:  July 2021 FINDINGS: MRI HEAD FINDINGS Motion artifact is present. Brain: Seen only on the coronal DWI sequence, there are a few possible small foci of mildly reduced diffusion in the left frontal lobe within the MCA territory. No corresponding T2 hyperintensity, noting that the T2 FLAIR sequence utilized together with motion artifact results in limited contrast resolution. There is no intracranial mass or mass effect. Thin subdural collections on the prior study have resolved. There is no hydrocephalus. Vascular: Major vessel flow voids at the skull base are preserved. Skull and upper cervical spine: Marrow signal is grossly within normal limits. Sinuses/Orbits: Nonspecific left maxillary sinus mucosal thickening and small air-fluid level. Orbits are unremarkable. Other: Mastoid air cells are clear. MRA HEAD FINDINGS Significant motion artifact is present. Anterior circulation: Preserved flow related enhancement of the intracranial internal  carotid arteries. M1 MCA segments are patent. No definite M2 occlusion, but these segments are poorly evaluated. No proximal anterior cerebral artery occlusion. Posterior circulation: The intracranial vertebral arteries, basilar artery, and proximal posterior cerebral arteries are patent. IMPRESSION: Suboptimal evaluation due to motion degradation. Few possible punctate acute infarcts in the left frontal low within the MCA territory. No proximal intracranial vessel occlusion. M2 MCA segments are not well evaluated. Electronically Signed   By: Macy Mis M.D.   On: 04/23/2021 15:14  ? ?MR BRAIN WO CONTRAST ? ?Result Date: 04/23/2021 ?CLINICAL DATA:  Stroke, hemorrhagic; Stroke/TIA, determine embolic source EXAM: MRI HEAD WITHOUT CONTRAST MRA HEAD WITHOUT CONTRAST TECHNIQUE: Multiplanar, multi-echo pulse sequences of the brain and surrounding structures were acquired without intravenous contrast. Angiographic images of the Circle of Willis were acquired using MRA technique without intravenous contrast. COMPARISON:  July 2021 FINDINGS: MRI HEAD FINDINGS Motion artifact is present. Brain: Seen only on the coronal DWI sequence, there are a few possible small foci of mildly reduced diffusion in the left frontal lobe within the MCA territory. No corresponding T2 hyperintensity, noting that the T2 FLAIR sequence utilized together with motion artifact results in limited contrast resolution. There is no intracranial mass or mass effect. Thin subdural collections on the prior study have resolved. There is no hydrocephalus. Vascular: Major vessel flow voids at the skull  base are preserved. Skull and upper cervical spine: Marrow signal is grossly within normal limits. Sinuses/Orbits: Nonspecific left maxillary sinus mucosal thickening and small air-fluid level. Orbits are unremarkable. Other: Mastoid air cells are clear. MRA HEAD FINDINGS Significant motion artifact is present. Anterior circulation: Preserved flow related  enhancement of the intracranial internal carotid arteries. M1 MCA segments are patent. No definite M2 occlusion, but these segments are poorly evaluated. No proximal anterior cerebral artery occlusion. Posterior circulation:

## 2021-04-24 NOTE — Procedures (Signed)
Patient Name: Gary [name]  ?MRN: [id number]  ?Epilepsy Attending: Lora Havens  ?Referring Physician/Provider: Amie Portland, MD ?Duration: 04/23/2021 1723 to 04/24/2021 1228 ?  ?Patient history: 76 y.o. male with PMH TBI, HTN, HLD, CKD, ?ischemic cardiomyopathy EF 40-45% (12/2020), DVT completed xarelto, MDD, kidney stones, cognitive impairment, gait instability, enlarged prostate presetned with right sided weakness and aphasia due to h/o shaking. EEG to evaluate for seizure. ?  ?Level of alertness: Awake, asleep ?  ?AEDs during EEG study: LEV ?  ?Technical aspects: This EEG study was done with scalp electrodes positioned according to the 10-20 International system of electrode placement. Electrical activity was acquired at a sampling rate of '500Hz'$  and reviewed with a high frequency filter of '70Hz'$  and a low frequency filter of '1Hz'$ . EEG data were recorded continuously and digitally stored.  ?  ?Description: The posterior dominant rhythm consists of 8-9 Hz activity of moderate voltage (25-35 uV) seen predominantly in posterior head regions, symmetric and reactive to eye opening and eye closing. EEG showed intermittent generalized 3 to 6 Hz theta-delta slowing. Hyperventilation and photic stimulation were not performed.    ?  ?ABNORMALITY ?- Intermittent slow, generalized ?  ?IMPRESSION: ?This study is suggestive of mild diffuse encephalopathy, nonspecific etiology. No seizures or epileptiform discharges were seen throughout the recording. ?   ?Lora Havens  ?

## 2021-04-25 DIAGNOSIS — I639 Cerebral infarction, unspecified: Secondary | ICD-10-CM | POA: Diagnosis not present

## 2021-04-25 DIAGNOSIS — J690 Pneumonitis due to inhalation of food and vomit: Secondary | ICD-10-CM | POA: Diagnosis not present

## 2021-04-25 DIAGNOSIS — I5022 Chronic systolic (congestive) heart failure: Secondary | ICD-10-CM | POA: Diagnosis not present

## 2021-04-25 DIAGNOSIS — Z86718 Personal history of other venous thrombosis and embolism: Secondary | ICD-10-CM | POA: Diagnosis not present

## 2021-04-25 LAB — RENAL FUNCTION PANEL
Albumin: 2.8 g/dL — ABNORMAL LOW (ref 3.5–5.0)
Anion gap: 9 (ref 5–15)
BUN: 18 mg/dL (ref 8–23)
CO2: 19 mmol/L — ABNORMAL LOW (ref 22–32)
Calcium: 8.6 mg/dL — ABNORMAL LOW (ref 8.9–10.3)
Chloride: 112 mmol/L — ABNORMAL HIGH (ref 98–111)
Creatinine, Ser: 1.88 mg/dL — ABNORMAL HIGH (ref 0.61–1.24)
GFR, Estimated: 37 mL/min — ABNORMAL LOW (ref >60)
Glucose, Bld: 77 mg/dL (ref 70–99)
Phosphorus: 2.8 mg/dL (ref 2.5–4.6)
Potassium: 4.4 mmol/L (ref 3.5–5.1)
Sodium: 140 mmol/L (ref 135–145)

## 2021-04-25 LAB — CBC
HCT: 44.2 % (ref 39.0–52.0)
Hemoglobin: 15.1 g/dL (ref 13.0–17.0)
MCH: 29.7 pg (ref 26.0–34.0)
MCHC: 34.2 g/dL (ref 30.0–36.0)
MCV: 86.8 fL (ref 80.0–100.0)
Platelets: 405 10*3/uL — ABNORMAL HIGH (ref 150–400)
RBC: 5.09 MIL/uL (ref 4.22–5.81)
RDW: 13.5 % (ref 11.5–15.5)
WBC: 9 10*3/uL (ref 4.0–10.5)
nRBC: 0 % (ref 0.0–0.2)

## 2021-04-25 LAB — CALCIUM, IONIZED: Calcium, Ionized, Serum: 4.7 mg/dL (ref 4.5–5.6)

## 2021-04-25 NOTE — Progress Notes (Signed)
Physical Therapy Treatment ?Patient Details ?Name: Gary [name] ?MRN: [id number] ?DOB: 03-15-1945 ?Today's Date: 04/25/2021 ? ? ?History of Present Illness 76 y.o. male presents to [facility] hospital on 04/23/2021 after episode of UE shaking and confusion. EMS noted aphasia and R facial droop. MRI showed scattered left sided hyperintensities suspected to be cardioembolic stroke. Pt received tNK. EEG negative for seizure. 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 ? ?  ?PT Comments  ? ? Pt tolerates treatment well with much improved ambulation distance. Pt is able to progress to ambulating without a device, however he continues to demonstrate rightward drift and losses of balance. Pt also experiencing posterior loss of balance during static standing. [name] spouse expresses concerns over the patient not being able to take himself to the bathroom at night and not having enough awareness to ask for help currently. PT continues to recommend AIR admission, acute PT will continue to follow.   ?Recommendations for follow up therapy are one component of a multi-disciplinary discharge planning process, led by the attending physician.  Recommendations may be updated based on patient status, additional functional criteria and insurance authorization. ? ?Follow Up Recommendations ? Acute inpatient rehab (3hours/day) ?  ?  ?Assistance Recommended at Discharge Frequent or constant Supervision/Assistance  ?Patient can return home with the following A little help with walking and/or transfers;A little help with bathing/dressing/bathroom;Assistance with cooking/housework;Direct supervision/assist for medications management;Direct supervision/assist for financial management;Help with stairs or ramp for entrance;Assist for transportation ?  ?Equipment Recommendations ? None recommended by PT  ?  ?Recommendations for Other Services   ? ? ?  ?Precautions /  Restrictions Precautions ?Precautions: Fall ?Restrictions ?Weight Bearing Restrictions: No  ?  ? ?Mobility ? Bed Mobility ?Overal bed mobility: Needs Assistance ?Bed Mobility: Supine to Sit ?  ?  ?Supine to sit: Supervision ?  ?  ?  ?  ? ?Transfers ?Overall transfer level: Needs assistance ?Equipment used: Rolling walker (2 wheels) ?Transfers: Sit to/from Stand ?Sit to Stand: Supervision ?  ?  ?  ?  ?  ?  ?  ? ?Ambulation/Gait ?Ambulation/Gait assistance: Min guard, Min assist ?Gait Distance (Feet): 200 Feet (additional 44' with walker) ?Assistive device: Rolling walker (2 wheels), None ?Gait Pattern/deviations: Step-through pattern, Staggering right, Drifts right/left ?Gait velocity: reduced ?Gait velocity interpretation: <1.8 ft/sec, indicate of risk for recurrent falls ?  ?General Gait Details: pt demonstrates good stbility with use of walker, no LOB noted. Without UE support pt demonstrates multiple rightward losses of balance, correcting with stepping strategy and intermittent minA. Losses of balance less frequent than last session ? ? ?Stairs ?Stairs: Yes ?Stairs assistance: Min guard ?Stair Management: One rail Right, Alternating pattern, Step to pattern (alternating pattern ascending, step to pattern descending) ?Number of Stairs: 10 ?  ? ? ?Wheelchair Mobility ?  ? ?Modified Rankin (Stroke Patients Only) ?Modified Rankin (Stroke Patients Only) ?Pre-Morbid Rankin Score: Moderate disability ?Modified Rankin: Moderately severe disability ? ? ?  ?Balance Overall balance assessment: Needs assistance ?Sitting-balance support: No upper extremity supported, Feet supported ?Sitting balance-Leahy Scale: Good ?  ?  ?Standing balance support: No upper extremity supported, During functional activity ?Standing balance-Leahy Scale: Poor ?Standing balance comment: posterior loss of balance during static standing, increased postural sway ?  ?  ?  ?  ?  ?  ?  ?  ?  ?  ?  ?  ? ?  ?Cognition Arousal/Alertness:  Awake/alert ?Behavior During Therapy: [facility] for tasks  assessed/performed ?Overall Cognitive Status: History of cognitive impairments - at baseline ?  ?  ?  ?  ?  ?  ?  ?  ?  ?  ?  ?  ?  ?  ?  ?  ?General Comments: pt with improved processing time compared to last session, appears to have difficulty with word finding. Spouse recognizes improvement in cognition but still reports the pt to not be at baseline. ?  ?  ? ?  ?Exercises   ? ?  ?General Comments General comments (skin integrity, edema, etc.): VSS on RA ?  ?  ? ?Pertinent Vitals/Pain Pain Assessment ?Pain Assessment: No/denies pain  ? ? ?Home Living   ?  ?  ?  ?  ?  ?  ?  ?  ?  ?   ?  ?Prior Function    ?  ?  ?   ? ?PT Goals (current goals can now be found in the care plan section) Acute Rehab PT Goals ?Patient Stated Goal: to return to prior level of function and independent mobility ?Progress towards PT goals: Progressing toward goals ? ?  ?Frequency ? ? ? Min 3X/week ? ? ? ?  ?PT Plan Current plan remains appropriate  ? ? ?Co-evaluation   ?  ?  ?  ?  ? ?  ?AM-PAC PT "6 Clicks" Mobility   ?Outcome Measure ? Help needed turning from your back to your side while in a flat bed without using bedrails?: A Little ?Help needed moving from lying on your back to sitting on the side of a flat bed without using bedrails?: A Little ?Help needed moving to and from a bed to a chair (including a wheelchair)?: A Little ?Help needed standing up from a chair using your arms (e.g., wheelchair or bedside chair)?: A Little ?Help needed to walk in hospital room?: A Little ?Help needed climbing 3-5 steps with a railing? : A Little ?6 Click Score: 18 ? ?  ?End of Session   ?Activity Tolerance: Patient tolerated treatment well ?Patient left: in chair;with call bell/phone within reach;with chair alarm set;with family/visitor present ?Nurse Communication: Mobility status ?PT Visit Diagnosis: Other abnormalities of gait and mobility (R26.89);Muscle weakness (generalized) (M62.81);Other  symptoms and signs involving the nervous system (R29.898) ?  ? ? ?Time: 6237-6283 ?PT Time Calculation (min) (ACUTE ONLY): 25 min ? ?Charges:  $Gait Training: 23-37 mins          ?          ? ?Zenaida Niece, PT, DPT ?Acute Rehabilitation ?Pager: [phone] ?Office [phone] ? ? ? ?Zenaida Niece ?04/25/2021, 9:27 AM ? ?

## 2021-04-25 NOTE — PMR Pre-admission (Signed)
PMR Admission Coordinator Pre-Admission Assessment ? ?Patient: Gary [name] is an 76 y.o., male ?MRN: [id number] ?DOB: [birth year] ?Height:   ?Weight: 74.8 kg ? ?Insurance Information ?HMO:     PPO:      PCP:      IPA:      80/20:      OTHER:  ?PRIMARY: [employer] commercial      Policy#: [id number]      Subscriber: pt ?CM Name: Jackelyn Poling      Phone#: [phone]     Fax#: [phone] ?Pre-Cert#: [id number]  approved for 7 days with f/u with Ceasar Mons phone [phone] ext [phone] fax [phone]    Employer:  ?Benefits:  Phone #: [phone]     Name: 4/20 ?Eff. Date: 01/06/21     Deduct: $500      Out of Pocket Max: $5000      Life Max: none ?CIR: 80%      SNF: 100 % 90 days ?Outpatient: $10 per visit     Co-Pay: 60 combined visits ?Home Health: 100%      Co-Pay: per medical neccesity visits ?DME: 80%     Co-Pay: 20% ?Providers: In network ? ?SECONDARY: Medicare a and b      Policy#: [id number] active 10/06/2012 ? ?THIRD: Workers Acupuncturist [id number] cae manager Meredeth Ide phone [phone] ? ?Financial Counselor:       Phone#:  ? ?The ?Data Collection Information Summary? for patients in Inpatient Rehabilitation Facilities with attached ?Privacy Act [place] Records? was provided and verbally reviewed with: Family ? ?Emergency Contact Information ?Contact Information   ? ? Name Relation Home Work Mobile  ? Dimitrius, Steedman Spouse [phone]    ? Jaedyn, Lard Daughter   [phone]  ? ?  ? ?Current Medical History  ?Patient Admitting Diagnosis: CVA ? ?History of Present Illness: 76 year old male with history of TBI, HTN, HLD, CKD, ? ischemic cardiomyopathy with EF 40 to 45%, DVT, MDD, kidney stones and enlarged prostate. Presented on 04/24/21 for possible seizure episode with arm extension, followed by shaking all over and then aphasia and right facial droop.  TNK given due to aphasia and right facial droop.  ? ?EEG no seizure mild diffuse encephalopathy. On Keppra. CT head no  acute abnormality. MRI repeated with scattered left MCA small infarcts. CTA head and neck unremarkable. 2 D echo EF 35 to 40 % LV global hypokinesis. 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 On no antithrombotic prior to admit. Now on ASA and Plavix for 3 weeks and then ASA alone. Cardiology consulted and LOOP recorder placed. Dr Harrell Gave with cardiology to follow. Added Carvedilol.LDL 136 now on Crestor. SCDs for VTE prophylaxis.  ? ?Complete NIHSS TOTAL: 2 ? ?Patient's medical record from [facility] has been reviewed by the rehabilitation admission coordinator and physician. ? ?Past Medical History  ?Past Medical History:  ?Diagnosis Date  ? ADHD   ? Allergy   ? CKD (chronic kidney disease)   ? GERD (gastroesophageal reflux disease)   ? History of chickenpox   ? History of diverticulitis 2007  ? History of kidney stones   ? HTN (hypertension)   ? Hypertension   ? Reflux   ? Renal disorder   ? kidney stones  ? TBI (traumatic brain injury) ([place]) 08/11/2019  ? TBI (traumatic brain injury) ([place]) 08/11/2019  ? Trauma   ? ?Has the patient had major surgery during 100 days prior to admission? No ? ?Family History   ?family history includes Heart  disease in his mother; Hypertension in his mother; Stroke in his mother. ? ?Current Medications ? ?Current Facility-Administered Medications:  ?  acetaminophen (TYLENOL) tablet 650 mg, 650 mg, Oral, Q4H PRN **OR** acetaminophen (TYLENOL) 160 MG/5ML solution 650 mg, 650 mg, Per Tube, Q4H PRN **OR** acetaminophen (TYLENOL) suppository 650 mg, 650 mg, Rectal, Q4H PRN, Amie Portland, MD ?  amphetamine-dextroamphetamine (ADDERALL XR) 24 hr capsule 20 mg, 20 mg, Oral, Daily, Rosalin Hawking, MD, 20 mg at 04/25/21 1124 ?  Ampicillin-Sulbactam (UNASYN) 3 g in sodium chloride 0.9 % 100 mL IVPB, 3 g, Intravenous, Q8H, Amie Portland, MD, Last Rate: 200 mL/hr at 04/26/21 0230, 3 g at 04/26/21 0230 ?  aspirin EC tablet 81 mg, 81 mg, Oral, Daily, Rosalin Hawking, MD, 81 mg at 04/25/21  1010 ?  azelastine (ASTELIN) 0.1 % nasal spray 1 spray, 1 spray, Each Nare, BID, Rosalin Hawking, MD, 1 spray at 04/25/21 1014 ?  buPROPion (WELLBUTRIN XL) 24 hr tablet 300 mg, 300 mg, Oral, Daily, Rosalin Hawking, MD, 300 mg at 04/25/21 1106 ?  carvedilol (COREG) tablet 3.125 mg, 3.125 mg, Oral, BID WC, Croitoru, Mihai, MD ?  Chlorhexidine Gluconate Cloth 2 % PADS 6 each, 6 each, Topical, Q0600, Amie Portland, MD, 6 each at 04/26/21 [phone]  cholecalciferol (VITAMIN D3) tablet 1,000 Units, 1,000 Units, Oral, Daily, Rosalin Hawking, MD, 1,000 Units at 04/25/21 1011 ?  clopidogrel (PLAVIX) tablet 75 mg, 75 mg, Oral, Daily, Rosalin Hawking, MD, 75 mg at 04/25/21 1011 ?  finasteride (PROSCAR) tablet 5 mg, 5 mg, Oral, Daily, Rosalin Hawking, MD, 5 mg at 04/25/21 1012 ?  ipratropium-albuterol (DUONEB) 0.5-2.5 (3) MG/3ML nebulizer solution 3 mL, 3 mL, Nebulization, QID PRN, Amie Portland, MD ?  levETIRAcetam (KEPPRA) tablet 500 mg, 500 mg, Oral, BID, Rosalin Hawking, MD, 500 mg at 04/25/21 2236 ?  multivitamin with minerals tablet 1 tablet, 1 tablet, Oral, Daily, Rosalin Hawking, MD, 1 tablet at 04/25/21 1008 ?  pantoprazole (PROTONIX) EC tablet 40 mg, 40 mg, Oral, Daily, Rosalin Hawking, MD, 40 mg at 04/25/21 1008 ?  rosuvastatin (CRESTOR) tablet 20 mg, 20 mg, Oral, Daily, Rosalin Hawking, MD, 20 mg at 04/25/21 1056 ?  senna-docusate (Senokot-S) tablet 1 tablet, 1 tablet, Oral, QHS PRN, Amie Portland, MD ? ?Patients Current Diet:  ?Diet Order   ? ?       ?  Diet regular Room service appropriate? Yes with Assist; Fluid consistency: Thin  Diet effective now       ?  ? ?  ?  ? ?  ? ?Precautions / Restrictions ?Precautions ?Precautions: Fall ?Restrictions ?Weight Bearing Restrictions: No  ? ?Has the patient had 2 or more falls or a fall with injury in the past year? No ? ?Prior Activity Level ?Limited Community (1-2x/wk): supervision for all activities due to TBI ? ?Prior Functional Level ?Self Care: Did the patient need help bathing, dressing, using the toilet  or eating? Needed some help ? ?Indoor Mobility: Did the patient need assistance with walking from room to room (with or without device)? Needed some help ? ?Stairs: Did the patient need assistance with internal or external stairs (with or without device)? Needed some help ? ?Functional Cognition: Did the patient need help planning regular tasks such as shopping or remembering to take medications? Needed some help ? ?Patient Information ?Are you of Hispanic, Latino/a,or Spanish origin?: A. No, not of Hispanic, Latino/a, or Spanish origin ?What is your race?: A. White ?Do you need or want an interpreter to  communicate with a doctor or health care staff?: 0. No ? ?Patient's Response To:  ?Health Literacy and Transportation ?Is the patient able to respond to health literacy and transportation needs?: No ?Health Literacy - How often do you need to have someone help you when you read instructions, pamphlets, or other written material from your doctor or pharmacy?: Patient unable to respond ?In the past 12 months, has lack of transportation kept you from medical appointments or from getting medications?: No ?In the past 12 months, has lack of transportation kept you from meetings, work, or from getting things needed for daily living?: No ? ?Home Assistive Devices / Equipment ?Home Equipment: Conservation officer, nature (2 wheels), [employer] - single point, [employer], BSC/3in1 ? ?Prior Device Use: Indicate devices/aids used by the patient prior to current illness, exacerbation or injury?  Cane in the community ? ?Current Functional Level ?Cognition ? Arousal/Alertness: Awake/alert ?Overall Cognitive Status: History of cognitive impairments - at baseline ?Orientation Level: Oriented to person, Oriented to place ?General Comments: pt with improved processing time compared to last session, appears to have difficulty with word finding. Spouse recognizes improvement in cognition but still reports the pt to not be at baseline. ?   ?Extremity  Assessment ?(includes Sensation/Coordination) ? Upper Extremity Assessment: Generalized weakness  ?Lower Extremity Assessment: Defer to PT evaluation  ?  ?ADLs ? Overall ADL's : Needs assistance/impaired ?

## 2021-04-25 NOTE — Progress Notes (Signed)
STROKE TEAM PROGRESS NOTE  ? ?SUBJECTIVE (INTERVAL HISTORY) ?His RN and wife are at the bedside.  Patient sitting in chair, awake alert, orientated x 3, more interactive than yesterday. 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 EF 35-40% down from before and cardiology consulted.  ? ?OBJECTIVE ?Temp:  [97.6 ?F (36.4 ?C)-98.3 ?F (36.8 ?C)] 98 ?F (36.7 ?C) (04/20 1745) ?Pulse Rate:  [72-89] 79 (04/20 1745) ?Resp:  [11-25] 18 (04/20 1745) ?BP: (125-153)/(67-90) 144/84 (04/20 1745) ?SpO2:  [89 %-100 %] 100 % (04/20 1745) ? ?Recent Labs  ?Lab 04/23/21 ?1335  ?GLUCAP 73  ? ?Recent Labs  ?Lab 04/23/21 ?1341 04/23/21 ?1501 04/24/21 ?0414 04/25/21 ?0339  ?NA 137 141  139 139 140  ?K 5.4* 5.4*  5.3* 4.0 4.4  ?CL 111 110  108 109 112*  ?CO2  --  20*  23 22 19*  ?GLUCOSE 82 84  87 79 77  ?BUN 26* '18  18 13 18  '$ ?CREATININE 2.10* 2.00*  1.95* 1.88* 1.88*  ?CALCIUM  --  9.1  9.0 8.7* 8.6*  ?MG  --  2.2 2.0  --   ?PHOS  --   --  3.5 2.8  ? ?Recent Labs  ?Lab 04/23/21 ?1501 04/24/21 ?0414 04/25/21 ?0339  ?AST 34 28  --   ?ALT 16 15  --   ?ALKPHOS 82 87  --   ?BILITOT 1.0 0.9  --   ?PROT 6.7 6.4*  --   ?ALBUMIN 3.4* 3.2* 2.8*  ? ?Recent Labs  ?Lab 04/23/21 ?1335 04/23/21 ?1341 04/24/21 ?0414 04/25/21 ?9450  ?WBC 7.7  --  9.5 9.0  ?NEUTROABS 5.6  --   --   --   ?HGB 15.6 15.0 15.9 15.1  ?HCT 46.8 44.0 47.7 44.2  ?MCV 89.5  --  87.2 86.8  ?PLT PLATELET CLUMPS NOTED ON SMEAR, UNABLE TO ESTIMATE  --  447* 405*  ? ?No results for input(s): CKTOTAL, CKMB, CKMBINDEX, TROPONINI in the last 168 hours. ?Recent Labs  ?  04/23/21 ?1335  ?LABPROT 15.0  ?INR 1.2  ? ?No results for input(s): COLORURINE, LABSPEC, [place], GLUCOSEU, HGBUR, BILIRUBINUR, KETONESUR, PROTEINUR, UROBILINOGEN, NITRITE, LEUKOCYTESUR in the last 72 hours. ? ?Invalid input(s): APPERANCEUR  ?   ?Component Value Date/Time  ? CHOL 189 04/24/2021 0414  ? TRIG 199 (H) 04/24/2021 0414  ? HDL 30 (L) 04/24/2021 0414  ? CHOLHDL 6.3 04/24/2021 0414  ? VLDL 40 04/24/2021  0414  ? [place] 119 (H) 04/24/2021 0414  ? ?Lab Results  ?Component Value Date  ? HGBA1C 5.3 04/23/2021  ? ?No results found for: LABOPIA, COCAINSCRNUR, [place], [place], [place], [place]  ?No results for input(s): ETH in the last 168 hours. ? ?I have personally reviewed the radiological images below and agree with the radiology interpretations. ? ?CT HEAD WO CONTRAST (5MM) ? ?Result Date: 04/23/2021 ?CLINICAL DATA:  Stroke-like symptoms EXAM: CT HEAD WITHOUT CONTRAST TECHNIQUE: Contiguous axial images were obtained from the base of the skull through the vertex without intravenous contrast. RADIATION DOSE REDUCTION: This exam was performed according to the departmental dose-optimization program which includes automated exposure control, adjustment of the mA and/or kV according to patient size and/or use of iterative reconstruction technique. COMPARISON:  None. FINDINGS: Brain: There is no mass, hemorrhage or extra-axial collection. The size and configuration of the ventricles and extra-axial CSF spaces are normal. The brain parenchyma is normal, without acute or chronic infarction. Vascular: No abnormal hyperdensity of the major intracranial arteries or dural venous sinuses. No intracranial atherosclerosis.  Skull: The visualized skull base, calvarium and extracranial soft tissues are normal. Sinuses/Orbits: No fluid levels or advanced mucosal thickening of the visualized paranasal sinuses. No mastoid or middle ear effusion. The orbits are normal. IMPRESSION: Normal head CT. Electronically Signed   By: Ulyses Jarred M.D.   On: 04/23/2021 22:16  ? ?CT HEAD WO CONTRAST (5MM) ? ?Result Date: 04/23/2021 ?CLINICAL DATA:  Mental status change.  Unknown cause. EXAM: CT HEAD WITHOUT CONTRAST TECHNIQUE: Contiguous axial images were obtained from the base of the skull through the vertex without intravenous contrast. RADIATION DOSE REDUCTION: This exam was performed according to the departmental dose-optimization program which  includes automated exposure control, adjustment of the mA and/or kV according to patient size and/or use of iterative reconstruction technique. COMPARISON:  04/23/2021 FINDINGS: Brain: No evidence of acute infarction, hemorrhage, hydrocephalus, extra-axial collection or mass lesion/mass effect. Vascular: No hyperdense vessel or unexpected calcification. Skull: No osseous abnormality. Sinuses/Orbits: Visualized paranasal sinuses are clear. Visualized mastoid sinuses are clear. Visualized orbits demonstrate no focal abnormality. Other: None IMPRESSION: 1. No acute intracranial findings. Electronically Signed   By: Kathreen Devoid M.D.   On: 04/23/2021 16:03  ? ?MR ANGIO HEAD WO CONTRAST ? ?Result Date: 04/23/2021 ?CLINICAL DATA:  Stroke, hemorrhagic; Stroke/TIA, determine embolic source EXAM: MRI HEAD WITHOUT CONTRAST MRA HEAD WITHOUT CONTRAST TECHNIQUE: Multiplanar, multi-echo pulse sequences of the brain and surrounding structures were acquired without intravenous contrast. Angiographic images of the Circle of Willis were acquired using MRA technique without intravenous contrast. COMPARISON:  July 2021 FINDINGS: MRI HEAD FINDINGS Motion artifact is present. Brain: Seen only on the coronal DWI sequence, there are a few possible small foci of mildly reduced diffusion in the left frontal lobe within the MCA territory. No corresponding T2 hyperintensity, noting that the T2 FLAIR sequence utilized together with motion artifact results in limited contrast resolution. There is no intracranial mass or mass effect. Thin subdural collections on the prior study have resolved. There is no hydrocephalus. Vascular: Major vessel flow voids at the skull base are preserved. Skull and upper cervical spine: Marrow signal is grossly within normal limits. Sinuses/Orbits: Nonspecific left maxillary sinus mucosal thickening and small air-fluid level. Orbits are unremarkable. Other: Mastoid air cells are clear. MRA HEAD FINDINGS Significant  motion artifact is present. Anterior circulation: Preserved flow related enhancement of the intracranial internal carotid arteries. M1 MCA segments are patent. No definite M2 occlusion, but these segments are poorly evaluated. No proximal anterior cerebral artery occlusion. Posterior circulation: The intracranial vertebral arteries, basilar artery, and proximal posterior cerebral arteries are patent. IMPRESSION: Suboptimal evaluation due to motion degradation. Few possible punctate acute infarcts in the left frontal low within the MCA territory. No proximal intracranial vessel occlusion. M2 MCA segments are not well evaluated. Electronically Signed   By: Macy Mis M.D.   On: 04/23/2021 15:14  ? ?MR BRAIN WO CONTRAST ? ?Result Date: 04/24/2021 ?CLINICAL DATA:  Stroke follow-up EXAM: MRI HEAD WITHOUT CONTRAST TECHNIQUE: Multiplanar, multiecho pulse sequences of the brain and surrounding structures were obtained without intravenous contrast. COMPARISON:  04/23/2021 FINDINGS: Brain: There are numerous small foci of abnormal diffusion restriction throughout the left MCA territory. The largest areas are located in the left frontal and parietal cortices and at the posterior insular cortex. No acute ischemia in other vascular distribution. There is petechial hemorrhage at the posterior left insula. There is multifocal hyperintense T2-weighted signal within the white matter. Generalized volume loss without a clear lobar predilection. The midline structures are normal. Vascular:  Major flow voids are preserved. Skull and upper cervical spine: Normal calvarium and skull base. Visualized upper cervical spine and soft tissues are normal. Sinuses/Orbits:Mild left maxillary sinus mucosal thickening. Normal orbits. IMPRESSION: 1. Numerous small acute infarcts throughout the left MCA territory. 2. Petechial hemorrhage at the posterior left insula. Heidelberg classification 1b: HI2, confluent petechiae, no mass effect.  Electronically Signed   By: Ulyses Jarred M.D.   On: 04/24/2021 19:34  ? ?MR BRAIN WO CONTRAST ? ?Result Date: 04/23/2021 ?CLINICAL DATA:  Stroke, hemorrhagic; Stroke/TIA, determine embolic source EXAM: MRI HEAD WITHOUT CONTR

## 2021-04-25 NOTE — Progress Notes (Signed)
?  Inpatient Rehabilitation Admissions Coordinator  ? ?I met with patient , wife and daughter at bedside. Patient previously at [facility] and is seen by Dr Naaman Plummer and Dr Sima Matas in outpatient clinic. Followed by Workers comp due to TBI injury. With wife's permission, I contacted Workers compensation case manager Meredeth Ide at [phone] of patient's admission. I will begin Auth with Tan[facility]a for possible admit to CIR. ? ?Danne Baxter, RN, MSN ?Rehab Admissions Coordinator ?([phone] ?04/25/2021 12:20 PM ? ?

## 2021-04-25 NOTE — Progress Notes (Signed)
Speech Language Pathology Treatment: Cognitive-Linquistic (aphasia)  ?Patient Details ?Name: Gary [name] ?MRN: [id number] ?DOB: 1945/05/05 ?Today's Date: 04/25/2021 ?Time: 3888-2800 ?SLP Time Calculation (min) (ACUTE ONLY): 17 min ? ?Assessment / Plan / Recommendation ?Clinical Impression ? Pt was seen for aphasia treatment with his wife and daughter present. He was alert and cooperative and he reported that his communication was improved compared to yesterday. [name] wife and daughter were educated regarding strategies to improve auditory comprehension, facilitate communication with the pt, and to reduce the pt's frustration. Both parties verbalized understanding as well as agreement and pt's wife indicated that she has already been using some of these strategies. Pt was educated regarding compensatory strategies for word retrieval and he used these strategies during structured tasks with verbal prompts. Pt demonstrated 75% accuracy with picture description increasing to 100% with prompts for word retrieval. He achieved 40% with responsive naming increasing to 60% with additional processing time, and to 100% with part-word cues and verbal prompts. Pt completed a sentence formulation task with 100% accuracy. He was able to participate in simple conversation with the SLP and was noted to use some compensatory strategies during episodes of word retrieval difficulty. SLP will continue to follow pt.   ?  ?HPI HPI: Pt is a 76 y.o. male who presented to the ED on 04/23/2021 after episode of UE shaking and confusion. EMS noted aphasia and R facial droop. TNK given.  MRI 4/19: Numerous small acute infarcts throughout the left MCA territory.  2. Petechial hemorrhage at the posterior left insula. EEG negative for seizure. 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 ?  ?   ?SLP Plan ? Continue with current plan of care ? ?  ?  ?Recommendations for follow up  therapy are one component of a multi-disciplinary discharge planning process, led by the attending physician.  Recommendations may be updated based on patient status, additional functional criteria and insurance authorization. ?  ? ?Recommendations  ?   ?   ?    ?   ? ? ? ? Follow Up Recommendations: Acute inpatient rehab (3hours/day) ?Assistance recommended at discharge: Intermittent Supervision/Assistance ?SLP Visit Diagnosis: Aphasia (R47.01) ?Plan: Continue with current plan of care ? ? ? ? ?  ?  ? ?Donne Baley I. Hardin Negus, [place], CCC-SLP ?Acute Rehabilitation Services ?Office number [phone] ?Pager [phone] ? ?Horton Marshall ? ?04/25/2021, 2:54 PM ? ? ? ?

## 2021-04-25 NOTE — Consult Note (Addendum)
?Cardiology Consultation:  ? ?Patient ID: Gary [name] ?MRN: [id number]; DOB: Oct 24, 1945 ? ?Admit date: 04/23/2021 ?Date of Consult: 04/25/2021 ? ?PCP:  Gary Coss, NP ?  ?[facility] Providers ?Cardiologist:  Gary Dresser, MD  ? ?Patient Profile:  ? ?Gary [name] is a 76 y.o. male with a hx of TBI, cognitive impairment, CVA, HTN, CKD, GERD, prior DVT no longer on xarelto,and ADHD who is being seen 04/25/2021 for the evaluation of cardiomyopathy at the request of Dr. Erlinda [name]. ? ?History of Present Illness:  ? ?Gary [name] was evaluated by Gary [name] 12/2020 for dizziness, palpitations, and abnormal EKG. 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 He reported palpitations during rehab. Hx of DVT with anticoagulation. Echo showed LVEF 40-45% - Gary [name] had a lengthy discussion regarding his cardiomyopathy and WMA. Pt and wife declined BB and ACEI/ARB/ARNI was not pursued due to kidney function. Hydralazine/imdur was discussed. They declined SGLT2i due to BPH and concern for yeast infection/UTI. He has not had an ischemic evaluation.  ? ?He was admitted 04/23/21 with new left MCA territory stroke s/p TNK. No large vessel occlusion. PCCM consulted for subsequent respiratory decline treated with NRB. He is aphasic a with right sided hemiparesis. Did not require intubation. EEG with diffuse encephalopathy, no seizure activity. Hypoxia felt possibly related to aspiration.  ? ?Echo repeated and showed LVEF declined to 35-40%. Cardiology was consulted. During my exam, his wife is not at bedside. He reports SOB, but can't give details. He tells me he can walk a mile. ? ?I was able to speak with his wife who confirms he does ambulate, but has walked much less over the last month due to foot pain. He has not complained of dyspnea or chest pain prior to this hospitalization.  ? ?CXR with pulmonary congestion. ? ? ?Past Medical History:  ?Diagnosis Date  ? ADHD   ? Allergy   ? CKD (chronic  kidney disease)   ? GERD (gastroesophageal reflux disease)   ? History of chickenpox   ? History of diverticulitis 2007  ? History of kidney stones   ? HTN (hypertension)   ? Hypertension   ? Reflux   ? Renal disorder   ? kidney stones  ? TBI (traumatic brain injury) ([place]) 08/11/2019  ? TBI (traumatic brain injury) ([place]) 08/11/2019  ? Trauma   ? ? ?Past Surgical History:  ?Procedure Laterality Date  ? MINOR REMOVAL OF MANDIBULAR HARDWARE N/A 12/15/2019  ? Procedure: REMOVAL OF RIGHT LATERAL ORBITAL MINIPLATE;  Surgeon: Gary Going, DO;  Location: [facility];  Service: Plastics;  Laterality: N/A;  ? ORIF MANDIBULAR FRACTURE Bilateral 07/27/2019  ? Procedure: OPEN REDUCTION INTERNAL FIXATION (ORIF) OF COMPLEX ZYGOMATIC FRACTURE;  Surgeon: Gary Going, DO;  Location: [facility];  Service: Plastics;  Laterality: Bilateral;  2 hours, please  ?  ? ?Home Medications:  ?Prior to Admission medications   ?Medication Sig Start Date End Date Taking? Authorizing Provider  ?amphetamine-dextroamphetamine (ADDERALL XR) 20 MG 24 hr capsule Take 1 capsule (20 mg total) by mouth daily. 01/30/21 01/30/22 Yes Meredith Staggers, MD  ?amphetamine-dextroamphetamine (ADDERALL) 10 MG tablet Take 1 tablet (10 mg total) by mouth 2 (two) times daily with a meal. ?Patient taking differently: Take 10 mg by mouth 2 (two) times daily as needed (adhd). 01/30/21 01/30/22 Yes Meredith Staggers, MD  ?azelastine (ASTELIN) 0.1 % nasal spray Place 1 spray into both nostrils 2 (two) times daily. Use in each nostril as directed 02/22/21  Yes Orland Mustard,  Richard, NP  ?b complex vitamins capsule Take 1 capsule by mouth daily.   Yes [provider]  ?buPROPion (WELLBUTRIN XL) 300 MG 24 hr tablet TAKE 1 TABLET(300 MG) BY MOUTH DAILY 03/04/21  Yes Meredith Staggers, MD  ?cetirizine (ZYRTEC) 10 MG tablet Take 1 tablet (10 mg total) by mouth daily. 02/22/21  Yes Gary Coss, NP  ?finasteride (PROSCAR) 5 MG tablet Take 1 tablet (5 mg total) by mouth daily.  03/21/21  Yes Gary Coss, NP  ?Multiple Vitamin (MULTIVITAMIN WITH MINERALS) TABS tablet Take 1 tablet by mouth daily.   Yes [provider]  ?predniSONE (DELTASONE) 20 MG tablet Take 2 tablets (40 mg) daily for 5 days 04/21/21  Yes Raylene Everts, MD  ?gabapentin (NEURONTIN) 100 MG capsule Take 1 capsule (100 mg total) by mouth 3 (three) times daily. ?Patient not taking: Reported on 04/23/2021 03/05/21   Gary Coss, NP  ?Vitamin D, Cholecalciferol, 1000 units CAPS Take 1 capsule by mouth daily. ?Patient not taking: Reported on 04/23/2021 03/18/17   Brunetta Jeans, PA-C  ? ? ?Inpatient Medications: ?Scheduled Meds: ? amphetamine-dextroamphetamine  20 mg Oral Daily  ? aspirin EC  81 mg Oral Daily  ? azelastine  1 spray Each Nare BID  ? buPROPion  300 mg Oral Daily  ? Chlorhexidine Gluconate Cloth  6 each Topical Q0600  ? cholecalciferol  1,000 Units Oral Daily  ? clopidogrel  75 mg Oral Daily  ? finasteride  5 mg Oral Daily  ? levETIRAcetam  500 mg Oral BID  ? multivitamin with minerals  1 tablet Oral Daily  ? pantoprazole  40 mg Oral Daily  ? rosuvastatin  20 mg Oral Daily  ? ?Continuous Infusions: ? ampicillin-sulbactam (UNASYN) IV 3 g (04/25/21 1019)  ? ?PRN Meds: ?acetaminophen **OR** acetaminophen (TYLENOL) oral liquid 160 mg/5 mL **OR** acetaminophen, ipratropium-albuterol, senna-docusate ? ?Allergies:    ?Allergies  ?Allergen Reactions  ? Levaquin [Levofloxacin] Nausea And Vomiting  ? Shellfish Allergy Hives  ? ? ?Social History:   ?Social History  ? ?Socioeconomic History  ? Marital status: Married  ?  Spouse name: Gary [name]  ? Number of children: Not on file  ? Years of education: Not on file  ? Highest education level: Not on file  ?Occupational History  ? Occupation: Warden/ranger  ?  Comment: [employer]  ?Tobacco Use  ? Smoking status: Former  ? Smokeless tobacco: Never  ?Vaping Use  ? Vaping Use: Never used  ?Substance and Sexual Activity  ? Alcohol use: Not Currently  ?  Drug use: Never  ? Sexual activity: Yes  ?Other Topics Concern  ? Not on file  ?Social History Narrative  ? ** Merged History Encounter **  ?    ? ?Social Determinants of Health  ? ?Financial Resource Strain: Not on file  ?Food Insecurity: Not on file  ?Transportation Needs: Not on file  ?Physical Activity: Not on file  ?Stress: Not on file  ?Social Connections: Not on file  ?Intimate Partner Violence: Not on file  ?  ?Family History:   ? ?Family History  ?Problem Relation Age of Onset  ? Heart disease Mother   ? Hypertension Mother   ? Stroke Mother   ?  ? ?ROS:  ?Please see the history of present illness.  ? ?All other ROS reviewed and negative.    ? ?Physical Exam/Data:  ? ?Vitals:  ? 04/25/21 1000 04/25/21 1010 04/25/21 1140 04/25/21 1200  ?BP:  (!) 150/74 Marland Kitchen)  142/78   ?Pulse: 76  75   ?Resp: '16 18 18   '$ ?Temp:    97.7 ?F (36.5 ?C)  ?TempSrc:    Oral  ?SpO2: 96%  98%   ?Weight:      ? ? ?Intake/Output Summary (Last 24 hours) at 04/25/2021 1443 ?Last data filed at 04/25/2021 1100 ?Gross per 24 hour  ?Intake 955.26 ml  ?Output 1100 ml  ?Net -144.74 ml  ? ? ?  04/23/2021  ?  1:00 PM 04/21/2021  ?  2:02 PM 03/18/2021  ?  1:59 PM  ?Last 3 Weights  ?Weight (lbs) 164 lb 14.5 oz 153 lb 163 lb 3.2 oz  ?Weight (kg) 74.8 kg 69.4 kg 74.027 kg  ?   ?Body mass index is 23.66 kg/m?.  ?General:  Well nourished, well developed, in no acute distress ?HEENT: normal ?Neck: no JVD ?Vascular: No carotid bruits; Distal pulses 2+ bilaterally ?Cardiac:  normal S1, S2; RRR; no murmur  ?Lungs:  crackles in bases  ?Abd: soft, nontender, no hepatomegaly  ?Ext: no edema ?Musculoskeletal:  No deformities, BUE and BLE strength normal and equal ?Skin: warm and dry  ?Neuro:  oriented to self and situation ?Psych:  Normal affect  ? ?EKG:  The EKG was personally reviewed and demonstrates:  sinus rhythm HR 74, TWI inferior an dlateral leads ?Telemetry:  Telemetry was personally reviewed and demonstrates:  sinus rhythm with HR 70s, PVCs ? ?Relevant CV  Studies: ? ?Echo 04/24/21: ? 1. Left ventricular ejection fraction, by estimation, is 35 to 40%. The  ?left ventricle has moderately decreased function. The left ventricle  ?demonstrates global hypokinesis. The

## 2021-04-26 ENCOUNTER — Encounter (HOSPITAL_COMMUNITY)
Admission: EM | Disposition: A | Discharge status: Rehabilitation Facility | Payer: Self-pay | Source: Home / Self Care | Attending: Neurology

## 2021-04-26 ENCOUNTER — Other Ambulatory Visit: Payer: Self-pay

## 2021-04-26 ENCOUNTER — Inpatient Hospital Stay (HOSPITAL_COMMUNITY)
Admission: RE | Admit: 2021-04-26 | Discharge: 2021-05-05 | DRG: 056 | Disposition: A | Discharge status: Home / Self Care | Payer: 59 | Source: Intra-hospital | Attending: Physical Medicine & Rehabilitation | Admitting: Physical Medicine & Rehabilitation

## 2021-04-26 ENCOUNTER — Inpatient Hospital Stay (HOSPITAL_COMMUNITY): Payer: 59

## 2021-04-26 ENCOUNTER — Encounter (HOSPITAL_COMMUNITY): Payer: Self-pay | Admitting: Cardiovascular Disease

## 2021-04-26 DIAGNOSIS — H532 Diplopia: Secondary | ICD-10-CM | POA: Diagnosis present

## 2021-04-26 DIAGNOSIS — R2689 Other abnormalities of gait and mobility: Secondary | ICD-10-CM | POA: Diagnosis present

## 2021-04-26 DIAGNOSIS — N400 Enlarged prostate without lower urinary tract symptoms: Secondary | ICD-10-CM | POA: Diagnosis present

## 2021-04-26 DIAGNOSIS — I69311 Memory deficit following cerebral infarction: Secondary | ICD-10-CM

## 2021-04-26 DIAGNOSIS — Z79899 Other long term (current) drug therapy: Secondary | ICD-10-CM

## 2021-04-26 DIAGNOSIS — I639 Cerebral infarction, unspecified: Secondary | ICD-10-CM

## 2021-04-26 DIAGNOSIS — I63512 Cerebral infarction due to unspecified occlusion or stenosis of left middle cerebral artery: Secondary | ICD-10-CM | POA: Diagnosis present

## 2021-04-26 DIAGNOSIS — G4700 Insomnia, unspecified: Secondary | ICD-10-CM | POA: Diagnosis present

## 2021-04-26 DIAGNOSIS — D75839 Thrombocytosis, unspecified: Secondary | ICD-10-CM | POA: Diagnosis present

## 2021-04-26 DIAGNOSIS — R4189 Other symptoms and signs involving cognitive functions and awareness: Secondary | ICD-10-CM | POA: Diagnosis present

## 2021-04-26 DIAGNOSIS — E785 Hyperlipidemia, unspecified: Secondary | ICD-10-CM | POA: Diagnosis present

## 2021-04-26 DIAGNOSIS — Z87891 Personal history of nicotine dependence: Secondary | ICD-10-CM

## 2021-04-26 DIAGNOSIS — Z823 Family history of stroke: Secondary | ICD-10-CM | POA: Diagnosis not present

## 2021-04-26 DIAGNOSIS — Z91013 Allergy to seafood: Secondary | ICD-10-CM

## 2021-04-26 DIAGNOSIS — R208 Other disturbances of skin sensation: Secondary | ICD-10-CM | POA: Diagnosis present

## 2021-04-26 DIAGNOSIS — N1832 Chronic kidney disease, stage 3b: Secondary | ICD-10-CM | POA: Diagnosis present

## 2021-04-26 DIAGNOSIS — J302 Other seasonal allergic rhinitis: Secondary | ICD-10-CM

## 2021-04-26 DIAGNOSIS — I129 Hypertensive chronic kidney disease with stage 1 through stage 4 chronic kidney disease, or unspecified chronic kidney disease: Secondary | ICD-10-CM | POA: Diagnosis present

## 2021-04-26 DIAGNOSIS — K219 Gastro-esophageal reflux disease without esophagitis: Secondary | ICD-10-CM | POA: Diagnosis present

## 2021-04-26 DIAGNOSIS — R569 Unspecified convulsions: Secondary | ICD-10-CM | POA: Diagnosis not present

## 2021-04-26 DIAGNOSIS — Z8249 Family history of ischemic heart disease and other diseases of the circulatory system: Secondary | ICD-10-CM | POA: Diagnosis not present

## 2021-04-26 DIAGNOSIS — Z87820 Personal history of traumatic brain injury: Secondary | ICD-10-CM

## 2021-04-26 DIAGNOSIS — F32A Depression, unspecified: Secondary | ICD-10-CM | POA: Diagnosis present

## 2021-04-26 DIAGNOSIS — F329 Major depressive disorder, single episode, unspecified: Secondary | ICD-10-CM | POA: Diagnosis not present

## 2021-04-26 DIAGNOSIS — F988 Other specified behavioral and emotional disorders with onset usually occurring in childhood and adolescence: Secondary | ICD-10-CM | POA: Diagnosis present

## 2021-04-26 DIAGNOSIS — I5022 Chronic systolic (congestive) heart failure: Secondary | ICD-10-CM | POA: Diagnosis not present

## 2021-04-26 DIAGNOSIS — I69398 Other sequelae of cerebral infarction: Secondary | ICD-10-CM | POA: Diagnosis not present

## 2021-04-26 DIAGNOSIS — I69320 Aphasia following cerebral infarction: Secondary | ICD-10-CM

## 2021-04-26 DIAGNOSIS — Z8673 Personal history of transient ischemic attack (TIA), and cerebral infarction without residual deficits: Secondary | ICD-10-CM | POA: Diagnosis present

## 2021-04-26 DIAGNOSIS — Z86718 Personal history of other venous thrombosis and embolism: Secondary | ICD-10-CM

## 2021-04-26 DIAGNOSIS — F900 Attention-deficit hyperactivity disorder, predominantly inattentive type: Secondary | ICD-10-CM

## 2021-04-26 DIAGNOSIS — I69322 Dysarthria following cerebral infarction: Secondary | ICD-10-CM | POA: Diagnosis not present

## 2021-04-26 DIAGNOSIS — Z881 Allergy status to other antibiotic agents status: Secondary | ICD-10-CM | POA: Diagnosis not present

## 2021-04-26 DIAGNOSIS — J690 Pneumonitis due to inhalation of food and vomit: Secondary | ICD-10-CM | POA: Diagnosis present

## 2021-04-26 DIAGNOSIS — G479 Sleep disorder, unspecified: Secondary | ICD-10-CM | POA: Diagnosis not present

## 2021-04-26 HISTORY — PX: LOOP RECORDER INSERTION: EP1214

## 2021-04-26 LAB — CBC
HCT: 47.7 % (ref 39.0–52.0)
Hemoglobin: 15.5 g/dL (ref 13.0–17.0)
MCH: 28.8 pg (ref 26.0–34.0)
MCHC: 32.5 g/dL (ref 30.0–36.0)
MCV: 88.7 fL (ref 80.0–100.0)
Platelets: 451 10*3/uL — ABNORMAL HIGH (ref 150–400)
RBC: 5.38 MIL/uL (ref 4.22–5.81)
RDW: 14 % (ref 11.5–15.5)
WBC: 7.4 10*3/uL (ref 4.0–10.5)
nRBC: 0 % (ref 0.0–0.2)

## 2021-04-26 LAB — RENAL FUNCTION PANEL
Albumin: 2.9 g/dL — ABNORMAL LOW (ref 3.5–5.0)
Anion gap: 8 (ref 5–15)
BUN: 18 mg/dL (ref 8–23)
CO2: 22 mmol/L (ref 22–32)
Calcium: 8.6 mg/dL — ABNORMAL LOW (ref 8.9–10.3)
Chloride: 109 mmol/L (ref 98–111)
Creatinine, Ser: 1.9 mg/dL — ABNORMAL HIGH (ref 0.61–1.24)
GFR, Estimated: 37 mL/min — ABNORMAL LOW (ref >60)
Glucose, Bld: 89 mg/dL (ref 70–99)
Phosphorus: 3.2 mg/dL (ref 2.5–4.6)
Potassium: 3.8 mmol/L (ref 3.5–5.1)
Sodium: 139 mmol/L (ref 135–145)

## 2021-04-26 SURGERY — LOOP RECORDER INSERTION

## 2021-04-26 MED ORDER — LIDOCAINE-EPINEPHRINE 1 %-1:100000 IJ SOLN
INTRAMUSCULAR | Status: AC
  Filled 2021-04-26: qty 1

## 2021-04-26 MED ORDER — DIPHENHYDRAMINE HCL 12.5 MG/5ML PO ELIX: 12.5000 mg | ORAL_SOLUTION | Freq: Four times a day (QID) | ORAL | Status: DC | PRN

## 2021-04-26 MED ORDER — ROSUVASTATIN CALCIUM 20 MG PO TABS: 20.0000 mg | ORAL_TABLET | Freq: Every day | ORAL | Status: DC

## 2021-04-26 MED ORDER — FINASTERIDE 5 MG PO TABS
5.0000 mg | ORAL_TABLET | Freq: Every day | ORAL | Status: DC
  Administered 2021-04-27 – 2021-05-05 (×9): 5 mg via ORAL
  Filled 2021-04-26 (×9): qty 1

## 2021-04-26 MED ORDER — SENNOSIDES-DOCUSATE SODIUM 8.6-50 MG PO TABS
1.0000 | ORAL_TABLET | Freq: Every evening | ORAL | Status: DC | PRN
  Administered 2021-05-01: 1 via ORAL
  Filled 2021-04-26: qty 1

## 2021-04-26 MED ORDER — ASPIRIN 81 MG PO TBEC: 81.0000 mg | DELAYED_RELEASE_TABLET | Freq: Every day | ORAL | 11 refills | Status: DC

## 2021-04-26 MED ORDER — AMPHETAMINE-DEXTROAMPHET ER 10 MG PO CP24
20.0000 mg | ORAL_CAPSULE | Freq: Every day | ORAL | Status: DC
  Administered 2021-04-27 – 2021-04-30 (×4): 20 mg via ORAL
  Filled 2021-04-26 (×5): qty 2

## 2021-04-26 MED ORDER — CLOPIDOGREL BISULFATE 75 MG PO TABS: 75.0000 mg | ORAL_TABLET | Freq: Every day | ORAL | Status: DC

## 2021-04-26 MED ORDER — IPRATROPIUM-ALBUTEROL 0.5-2.5 (3) MG/3ML IN SOLN: 3.0000 mL | Freq: Four times a day (QID) | RESPIRATORY_TRACT | Status: DC | PRN

## 2021-04-26 MED ORDER — CARVEDILOL 3.125 MG PO TABS: 3.1250 mg | ORAL_TABLET | Freq: Two times a day (BID) | ORAL | Status: DC

## 2021-04-26 MED ORDER — FLEET ENEMA 7-19 GM/118ML RE ENEM: 1.0000 | ENEMA | Freq: Once | RECTAL | Status: DC | PRN

## 2021-04-26 MED ORDER — VITAMIN D 25 MCG (1000 UNIT) PO TABS
1000.0000 [IU] | ORAL_TABLET | Freq: Every day | ORAL | Status: DC
  Administered 2021-04-27 – 2021-05-05 (×9): 1000 [IU] via ORAL
  Filled 2021-04-26 (×9): qty 1

## 2021-04-26 MED ORDER — CARVEDILOL 3.125 MG PO TABS
3.1250 mg | ORAL_TABLET | Freq: Two times a day (BID) | ORAL | Status: DC
  Administered 2021-04-26 – 2021-05-05 (×18): 3.125 mg via ORAL
  Filled 2021-04-26 (×18): qty 1

## 2021-04-26 MED ORDER — ASPIRIN EC 81 MG PO TBEC
81.0000 mg | DELAYED_RELEASE_TABLET | Freq: Every day | ORAL | Status: DC
  Administered 2021-04-27 – 2021-05-05 (×9): 81 mg via ORAL
  Filled 2021-04-26 (×9): qty 1

## 2021-04-26 MED ORDER — AZELASTINE HCL 0.1 % NA SOLN
1.0000 | Freq: Two times a day (BID) | NASAL | Status: DC
  Administered 2021-04-26 – 2021-05-05 (×18): 1 via NASAL
  Filled 2021-04-26: qty 30

## 2021-04-26 MED ORDER — LEVETIRACETAM 250 MG PO TABS
500.0000 mg | ORAL_TABLET | Freq: Two times a day (BID) | ORAL | Status: DC
  Administered 2021-04-26 – 2021-05-05 (×18): 500 mg via ORAL
  Filled 2021-04-26 (×19): qty 2

## 2021-04-26 MED ORDER — BUPROPION HCL ER (XL) 300 MG PO TB24
300.0000 mg | ORAL_TABLET | Freq: Every day | ORAL | Status: DC
  Administered 2021-04-27 – 2021-05-05 (×9): 300 mg via ORAL
  Filled 2021-04-26 (×9): qty 1

## 2021-04-26 MED ORDER — ROSUVASTATIN CALCIUM 20 MG PO TABS
20.0000 mg | ORAL_TABLET | Freq: Every day | ORAL | Status: DC
  Administered 2021-04-27 – 2021-05-05 (×9): 20 mg via ORAL
  Filled 2021-04-26 (×9): qty 1

## 2021-04-26 MED ORDER — ACETAMINOPHEN 325 MG PO TABS: 325.0000 mg | ORAL_TABLET | ORAL | Status: DC | PRN

## 2021-04-26 MED ORDER — CHLORHEXIDINE GLUCONATE CLOTH 2 % EX PADS: 6.0000 | MEDICATED_PAD | Freq: Every day | CUTANEOUS | Status: DC

## 2021-04-26 MED ORDER — TRAZODONE HCL 50 MG PO TABS: 25.0000 mg | ORAL_TABLET | Freq: Every evening | ORAL | Status: DC | PRN

## 2021-04-26 MED ORDER — ALUM & MAG HYDROXIDE-SIMETH 200-200-20 MG/5ML PO SUSP: 30.0000 mL | ORAL | Status: DC | PRN

## 2021-04-26 MED ORDER — SODIUM CHLORIDE 0.9 % IV SOLN
3.0000 g | Freq: Three times a day (TID) | INTRAVENOUS | Status: AC
  Administered 2021-04-26 – 2021-04-28 (×6): 3 g via INTRAVENOUS
  Filled 2021-04-26 (×6): qty 8

## 2021-04-26 MED ORDER — GUAIFENESIN-DM 100-10 MG/5ML PO SYRP: 5.0000 mL | ORAL_SOLUTION | Freq: Four times a day (QID) | ORAL | Status: DC | PRN

## 2021-04-26 MED ORDER — PANTOPRAZOLE SODIUM 40 MG PO TBEC
40.0000 mg | DELAYED_RELEASE_TABLET | Freq: Every day | ORAL | Status: DC
  Administered 2021-04-27 – 2021-05-05 (×9): 40 mg via ORAL
  Filled 2021-04-26 (×9): qty 1

## 2021-04-26 MED ORDER — LEVETIRACETAM 500 MG PO TABS: 500.0000 mg | ORAL_TABLET | Freq: Two times a day (BID) | ORAL | Status: DC

## 2021-04-26 MED ORDER — CLOPIDOGREL BISULFATE 75 MG PO TABS
75.0000 mg | ORAL_TABLET | Freq: Every day | ORAL | Status: DC
  Administered 2021-04-27 – 2021-05-05 (×9): 75 mg via ORAL
  Filled 2021-04-26 (×9): qty 1

## 2021-04-26 MED ORDER — PROCHLORPERAZINE EDISYLATE 10 MG/2ML IJ SOLN: 5.0000 mg | Freq: Four times a day (QID) | INTRAMUSCULAR | Status: DC | PRN

## 2021-04-26 MED ORDER — METHOCARBAMOL 500 MG PO TABS: 500.0000 mg | ORAL_TABLET | Freq: Four times a day (QID) | ORAL | Status: DC | PRN

## 2021-04-26 MED ORDER — SORBITOL 70 % SOLN: 30.0000 mL | Freq: Every day | Status: DC | PRN

## 2021-04-26 MED ORDER — ADULT MULTIVITAMIN W/MINERALS CH
1.0000 | ORAL_TABLET | Freq: Every day | ORAL | Status: DC
  Administered 2021-04-27 – 2021-05-05 (×9): 1 via ORAL
  Filled 2021-04-26 (×9): qty 1

## 2021-04-26 MED ORDER — PROCHLORPERAZINE 25 MG RE SUPP: 12.5000 mg | Freq: Four times a day (QID) | RECTAL | Status: DC | PRN

## 2021-04-26 MED ORDER — DICLOFENAC SODIUM 1 % EX GEL
2.0000 g | Freq: Four times a day (QID) | CUTANEOUS | Status: DC | PRN
  Filled 2021-04-26: qty 100

## 2021-04-26 MED ORDER — PROCHLORPERAZINE MALEATE 5 MG PO TABS: 5.0000 mg | ORAL_TABLET | Freq: Four times a day (QID) | ORAL | Status: DC | PRN

## 2021-04-26 SURGICAL SUPPLY — 2 items
PACK LOOP INSERTION (CUSTOM PROCEDURE TRAY) ×2 IMPLANT
SYSTEM MONITOR REVEAL LINQ II (Prosthesis & Implant Heart) ×1 IMPLANT

## 2021-04-26 NOTE — Op Note (Signed)
LOOP RECORDER IMPLANT ?  ?Procedure report  ?Procedure performed:  ?Loop recorder implantation  ? ?Reason for procedure:  ?Cryptogenic stroke ?Procedure performed by:  ?Sanda Klein, MD  ?Complications:  ?None  ?Estimated blood loss:  ?<5 mL  ?Medications administered during procedure:  ?Lidocaine 1% with 1/10,000 epinephrine 10 mL locally ?Device details:  ?Medtronic Reveal Linq model number [id number], serial number [id number] ?Procedure details:  ?After the risks and benefits of the procedure were discussed the patient provided informed consent. The patient was prepped and draped in usual sterile fashion. Local anesthesia was administered to an area 2 cm to the left of the sternum in the 4th intercostal space. A cutaneous incision was made using the incision tool. The introducer was then used to create a subcutaneous tunnel and carefully deploy the device. Local pressure was held to ensure hemostasis.  ?The incision was closed with SteriStrips and a sterile dressing was applied.  ?R waves 1.0 mV. ? ?Sanda Klein, MD, [facility] ?[place] ?([phone] office ?([phone] pager ?04/26/2021 ?9:49 AM ? ? ?
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
## 2021-04-26 NOTE — Progress Notes (Signed)
?  Inpatient Rehabilitation Admissions Coordinator  ? ?I have insurance approval and [facility] bed to admit him to today. I contacted Dr Erlinda Hong, acute team and TOC. I spoke with his wife by phone and she is in agreement. I will make the arrangements to admit today. ? ?Danne Baxter, RN, MSN ?Rehab Admissions Coordinator ?[phone] ?04/26/2021 9:58 AM ? ?

## 2021-04-26 NOTE — Progress Notes (Signed)
Patient brought to unit with nurse in transport chair. Patient calm. Orientated to unit. ?Sanda Linger, LPN ? ?

## 2021-04-26 NOTE — Progress Notes (Signed)
Occupational Therapy Treatment ?Patient Details ?Name: Gary [name] ?MRN: [id number] ?DOB: 10-Sep-1945 ?Today's Date: 04/26/2021 ? ? ?History of present illness 76 y.o. male presents to [facility] hospital on 04/23/2021 after episode of UE shaking and confusion. EMS noted aphasia and R facial droop. MRI showed scattered left sided hyperintensities suspected to be cardioembolic stroke. Pt received tNK. EEG negative for seizure. 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 ?  ?OT comments ? Patient received in bed and agreeable to OT session. Patient able to sit on EOB with supervision and required min assist to change socks due to assistance with left sock. Patient able to stand at sink and perform grooming tasks with min guard for safety. Performed mobility without a device in hallway with occasional min assist to correct balance. Patient would benefit from further OT services to increase independence and safety with self care tasks.   ? ?Recommendations for follow up therapy are one component of a multi-disciplinary discharge planning process, led by the attending physician.  Recommendations may be updated based on patient status, additional functional criteria and insurance authorization. ?   ?Follow Up Recommendations ? Acute inpatient rehab (3hours/day)  ?  ?Assistance Recommended at Discharge Frequent or constant Supervision/Assistance  ?Patient can return home with the following ? A lot of help with bathing/dressing/bathroom;Assistance with cooking/housework;Direct supervision/assist for medications management;Direct supervision/assist for financial management;Help with stairs or ramp for entrance;Assist for transportation;A little help with walking and/or transfers ?  ?Equipment Recommendations ? None recommended by OT  ?  ?Recommendations for Other Services   ? ?  ?Precautions / Restrictions Precautions ?Precautions:  Fall ?Restrictions ?Weight Bearing Restrictions: No  ? ? ?  ? ?Mobility Bed Mobility ?Overal bed mobility: Needs Assistance ?Bed Mobility: Supine to Sit, Sit to Supine ?  ?  ?Supine to sit: Supervision ?Sit to supine: Supervision ?  ?General bed mobility comments: increased time and verbal cues ?  ? ?Transfers ?Overall transfer level: Needs assistance ?Equipment used: 1 person hand held assist ?Transfers: Sit to/from Stand ?Sit to Stand: Supervision ?  ?  ?  ?  ?  ?General transfer comment: hand held assist with min assist to correct balance ?  ?  ?Balance Overall balance assessment: Needs assistance ?Sitting-balance support: No upper extremity supported, Feet supported ?Sitting balance-Leahy Scale: Good ?Sitting balance - Comments: able to maintain balance on EOB while donning socks ?  ?Standing balance support: No upper extremity supported, During functional activity ?Standing balance-Leahy Scale: Poor ?Standing balance comment: stood at sink with min guard assist for balance ?  ?  ?  ?  ?  ?  ?  ?  ?  ?  ?  ?   ? ?ADL either performed or assessed with clinical judgement  ? ?ADL Overall ADL's : Needs assistance/impaired ?  ?  ?Grooming: Wash/dry hands;Wash/dry face;Oral care;Standing;Min guard ?Grooming Details (indicate cue type and reason): at sink ?  ?  ?  ?  ?Upper Body Dressing : Min guard;Sitting ?Upper Body Dressing Details (indicate cue type and reason): donned gown to cover back ?Lower Body Dressing: Minimal assistance;Sitting/lateral leans ?Lower Body Dressing Details (indicate cue type and reason): donned socks seated on EOB with min assist for left sock ?  ?  ?  ?  ?  ?  ?  ?General ADL Comments: verbal cues to remove left sock before donning new one ?  ? ?Extremity/Trunk Assessment   ?  ?  ?  ?  ?  ? ?  Vision   ?  ?  ?Perception   ?  ?Praxis   ?  ? ?Cognition Arousal/Alertness: Awake/alert ?Behavior During Therapy: [facility] for tasks assessed/performed ?Overall Cognitive Status: History of cognitive  impairments - at baseline ?  ?  ?  ?  ?  ?  ?  ?  ?  ?  ?  ?  ?  ?  ?  ?  ?General Comments: slow processing, cues for safety ?  ?  ?   ?Exercises   ? ?  ?Shoulder Instructions   ? ? ?  ?General Comments    ? ? ?Pertinent Vitals/ Pain       Pain Assessment ?Pain Assessment: No/denies pain ? ?Home Living   ?  ?  ?  ?  ?  ?  ?  ?  ?  ?  ?  ?  ?  ?  ?  ?  ?  ?  ? ?  ?Prior Functioning/Environment    ?  ?  ?  ?   ? ?Frequency ? Min 2X/week  ? ? ? ? ?  ?Progress Toward Goals ? ?OT Goals(current goals can now be found in the care plan section) ? Progress towards OT goals: Progressing toward goals ? ?Acute Rehab OT Goals ?Patient Stated Goal: get better ?OT Goal Formulation: With patient ?Time For Goal Achievement: 05/08/21 ?Potential to Achieve Goals: Good ?ADL Goals ?Pt Will Perform Lower Body Dressing: with supervision;sit to/from stand ?Pt Will Transfer to Toilet: with modified independence;ambulating ?Additional ADL Goal #1: Pt will inependently sequence 3 step task during ADL in a nondistracting environment ?Additional ADL Goal #2: Pt will complete IADL medication management task with supervision A  ?Plan Discharge plan remains appropriate   ? ?Co-evaluation ? ? ?   ?  ?  ?  ?  ? ?  ?AM-PAC OT "6 Clicks" Daily Activity     ?Outcome Measure ? ? Help from another person eating meals?: A Little ?Help from another person taking care of personal grooming?: A Little ?Help from another person toileting, which includes using toliet, bedpan, or urinal?: A Lot ?Help from another person bathing (including washing, rinsing, drying)?: A Lot ?Help from another person to put on and taking off regular upper body clothing?: A Little ?Help from another person to put on and taking off regular lower body clothing?: A Lot ?6 Click Score: 15 ? ?  ?End of Session Equipment Utilized During Treatment: Gait belt ? ?OT Visit Diagnosis: Unsteadiness on feet (R26.81);Other abnormalities of gait and mobility (R26.89);Muscle weakness (generalized)  (M62.81) ?  ?Activity Tolerance Patient tolerated treatment well ?  ?[name];with call bell/phone within reach;with bed alarm set;with family/visitor present ?  ?Nurse Communication Mobility status ?  ? ?   ? ?Time: 5465-0354 ?OT Time Calculation (min): 24 min ? ?Charges: OT General Charges ?$OT Visit: 1 Visit ?OT Treatments ?$Self Care/Home Management : 8-22 mins ?$Therapeutic Activity: 8-22 mins ? ?Lodema Hong, OTA ?Acute Rehabilitation Services  ?Pager [phone] ?Office [phone] ? ? ?[place] ?04/26/2021, 1:18 PM ?
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
## 2021-04-26 NOTE — Progress Notes (Addendum)
? ?Progress Note ? ?Patient Name: Jerrell Mangel ?Date of Encounter: 04/26/2021 ? ?[facility] Cardiologist: Buford Dresser, MD  ? ?Subjective  ? ?No new neurological events.  Denies angina.  Denies dyspnea.  Able to lie fully flat without complaints.  No significant arrhythmia on monitor overnight. ? ?Inpatient Medications  ?  ?Scheduled Meds: ? [MAR Hold] amphetamine-dextroamphetamine  20 mg Oral Daily  ? [MAR Hold] aspirin EC  81 mg Oral Daily  ? [MAR Hold] azelastine  1 spray Each Nare BID  ? [MAR Hold] buPROPion  300 mg Oral Daily  ? [MAR Hold] Chlorhexidine Gluconate Cloth  6 each Topical Q0600  ? [MAR Hold] cholecalciferol  1,000 Units Oral Daily  ? [MAR Hold] clopidogrel  75 mg Oral Daily  ? [MAR Hold] finasteride  5 mg Oral Daily  ? [MAR Hold] levETIRAcetam  500 mg Oral BID  ? [MAR Hold] multivitamin with minerals  1 tablet Oral Daily  ? [MAR Hold] pantoprazole  40 mg Oral Daily  ? [MAR Hold] rosuvastatin  20 mg Oral Daily  ? ?Continuous Infusions: ? [MAR Hold] ampicillin-sulbactam (UNASYN) IV 3 g (04/26/21 0230)  ? ?PRN Meds: ?[MAR Hold] acetaminophen **OR** [MAR Hold] acetaminophen (TYLENOL) oral liquid 160 mg/5 mL **OR** [MAR Hold] acetaminophen, [MAR Hold] ipratropium-albuterol, [MAR Hold] senna-docusate  ? ?Vital Signs  ?  ?Vitals:  ? 04/26/21 0052 04/26/21 0425 04/26/21 0973 04/26/21 0936  ?BP: (!) 143/93 132/66 132/66 (!) 156/74  ?Pulse: 74 74 74 80  ?Resp: '18  18 18  '$ ?Temp: 98.1 ?F (36.7 ?C) 98.3 ?F (36.8 ?C) 98.3 ?F (36.8 ?C) (!) 97.5 ?F (36.4 ?C)  ?TempSrc:  Oral Oral Oral  ?SpO2: 97% 95% 95% 100%  ?Weight:      ? ? ?Intake/Output Summary (Last 24 hours) at 04/26/2021 0937 ?Last data filed at 04/25/2021 1500 ?Gross per 24 hour  ?Intake 460 ml  ?Output --  ?Net 460 ml  ? ? ?  04/23/2021  ?  1:00 PM 04/21/2021  ?  2:02 PM 03/18/2021  ?  1:59 PM  ?Last 3 Weights  ?Weight (lbs) 164 lb 14.5 oz 153 lb 163 lb 3.2 oz  ?Weight (kg) 74.8 kg 69.4 kg 74.027 kg  ?   ? ?Telemetry  ?  ?Sinus rhythm,  occasional PVCs- Personally Reviewed ? ?ECG  ?  ?No new tracing- Personally Reviewed ? ?Physical Exam  ?Lean ?GEN: No acute distress.   ?Neck: No JVD ?Cardiac: RRR, no murmurs, rubs, or gallops.  ?Respiratory: Clear to auscultation bilaterally. ?GI: Soft, nontender, non-distended  ?MS: No edema; No deformity. ?Neuro:  Nonfocal; short-term memory is a little challenged ?Psych: Normal affect  ? ?Labs  ?  ?High Sensitivity Troponin:  No results for input(s): TROPONINIHS in the last 720 hours.   ?Chemistry ?Recent Labs  ?Lab 04/23/21 ?1501 04/24/21 ?0414 04/25/21 ?5329 04/26/21 ?0255  ?NA 141  139 139 140 139  ?K 5.4*  5.3* 4.0 4.4 3.8  ?CL 110  108 109 112* 109  ?CO2 20*  23 22 19* 22  ?GLUCOSE 84  87 79 77 89  ?BUN '18  18 13 18 18  '$ ?CREATININE 2.00*  1.95* 1.88* 1.88* 1.90*  ?CALCIUM 9.1  9.0 8.7* 8.6* 8.6*  ?MG 2.2 2.0  --   --   ?PROT 6.7 6.4*  --   --   ?ALBUMIN 3.4* 3.2* 2.8* 2.9*  ?AST 34 28  --   --   ?ALT 16 15  --   --   ?  ALKPHOS 82 87  --   --   ?BILITOT 1.0 0.9  --   --   ?GFRNONAA 35*  36* 37* 37* 37*  ?ANIONGAP '11  8 8 9 8  '$ ?  ?Lipids  ?Recent Labs  ?Lab 04/24/21 ?7371  ?CHOL 189  ?TRIG 199*  ?HDL 30*  ?LDLCALC 119*  ?CHOLHDL 6.3  ?  ?Hematology ?Recent Labs  ?Lab 04/24/21 ?0626 04/25/21 ?9485 04/26/21 ?0255  ?WBC 9.5 9.0 7.4  ?RBC 5.47 5.09 5.38  ?HGB 15.9 15.1 15.5  ?HCT 47.7 44.2 47.7  ?MCV 87.2 86.8 88.7  ?MCH 29.1 29.7 28.8  ?MCHC 33.3 34.2 32.5  ?RDW 13.8 13.5 14.0  ?PLT 447* 405* 451*  ? ?Thyroid No results for input(s): TSH, FREET4 in the last 168 hours.  ?BNPNo results for input(s): BNP, PROBNP in the last 168 hours.  ?DDimer No results for input(s): DDIMER in the last 168 hours.  ? ?Radiology  ?  ?MR BRAIN WO CONTRAST ? ?Result Date: 04/24/2021 ?CLINICAL DATA:  Stroke follow-up EXAM: MRI HEAD WITHOUT CONTRAST TECHNIQUE: Multiplanar, multiecho pulse sequences of the brain and surrounding structures were obtained without intravenous contrast. COMPARISON:  04/23/2021 FINDINGS: Brain: There  are numerous small foci of abnormal diffusion restriction throughout the left MCA territory. The largest areas are located in the left frontal and parietal cortices and at the posterior insular cortex. No acute ischemia in other vascular distribution. There is petechial hemorrhage at the posterior left insula. There is multifocal hyperintense T2-weighted signal within the white matter. Generalized volume loss without a clear lobar predilection. The midline structures are normal. Vascular: Major flow voids are preserved. Skull and upper cervical spine: Normal calvarium and skull base. Visualized upper cervical spine and soft tissues are normal. Sinuses/Orbits:Mild left maxillary sinus mucosal thickening. Normal orbits. IMPRESSION: 1. Numerous small acute infarcts throughout the left MCA territory. 2. Petechial hemorrhage at the posterior left insula. Heidelberg classification 1b: HI2, confluent petechiae, no mass effect. Electronically Signed   By: Ulyses Jarred M.D.   On: 04/24/2021 19:34  ? ?Overnight EEG with video ? ?Result Date: 04/24/2021 ?Lora Havens, MD     04/24/2021  2:12 PM Patient Name: Normal Recinos MRN: [id number] Epilepsy Attending: Lora Havens Referring Physician/Provider: Amie Portland, MD Duration: 04/23/2021 1723 to 04/24/2021 1228  Patient history: 76 y.o. male with PMH TBI, HTN, HLD, CKD, ?ischemic cardiomyopathy EF 40-45% (12/2020), DVT completed xarelto, MDD, kidney stones, cognitive impairment, gait instability, enlarged prostate presetned with right sided weakness and aphasia due to h/o shaking. EEG to evaluate for seizure.  Level of alertness: Awake, asleep  AEDs during EEG study: LEV  Technical aspects: This EEG study was done with scalp electrodes positioned according to the 10-20 International system of electrode placement. Electrical activity was acquired at a sampling rate of '500Hz'$  and reviewed with a high frequency filter of '70Hz'$  and a low frequency filter of '1Hz'$ . EEG data  were recorded continuously and digitally stored.  Description: The posterior dominant rhythm consists of 8-9 Hz activity of moderate voltage (25-35 uV) seen predominantly in posterior head regions, symmetric and reactive to eye opening and eye closing. EEG showed intermittent generalized 3 to 6 Hz theta-delta slowing. Hyperventilation and photic stimulation were not performed.    ABNORMALITY - Intermittent slow, generalized  IMPRESSION: This study is suggestive of mild diffuse encephalopathy, nonspecific etiology. No seizures or epileptiform discharges were seen throughout the recording.   Priyanka [name]  ? ?ECHOCARDIOGRAM COMPLETE ? ?Result Date: 04/24/2021 ?  ECHOCARDIOGRAM REPORT   Patient Name:   DOMONICK SITTNER Date of Exam: 04/24/2021 Medical Rec #:  [id number]        Height:       70.0 in Accession #:    [id number]       Weight:       164.9 lb Date of Birth:  11/03/1945       BSA:          1.923 m? Patient Age:    64 years         BP:           137/76 mmHg Patient Gender: M                HR:           84 bpm. Exam Location:  Inpatient Procedure: 2D Echo Indications:    Stroke  History:        Patient has prior history of Echocardiogram examinations, most                 recent 01/03/2021. Risk Factors:Hypertension.  Sonographer:    Arlyss Gandy Referring Phys: [id number] ASHISH ARORA IMPRESSIONS  1. Left ventricular ejection fraction, by estimation, is 35 to 40%. The left ventricle has moderately decreased function. The left ventricle demonstrates global hypokinesis. The left ventricular internal cavity size was moderately dilated. Left ventricular diastolic parameters were normal.  2. Right ventricular systolic function is normal. The right ventricular size is normal. There is mildly elevated pulmonary artery systolic pressure.  3. Left atrial size was mildly dilated.  4. The mitral valve is abnormal. Mild mitral valve regurgitation. No evidence of mitral stenosis.  5. Tricuspid valve regurgitation is  moderate.  6. The aortic valve is tricuspid. Aortic valve regurgitation is mild. No aortic stenosis is present.  7. Aortic dilatation noted. There is mild dilatation of the aortic root, measuring 38 mm. There is mild dilatat

## 2021-04-26 NOTE — Discharge Summary (Signed)
Stroke Discharge Summary  ?Patient ID: Gary [name]    l ?  MRN: [id number]    ?  DOB: Mar 07, 1945 ? ?Date of Admission: 04/23/2021 ?Date of Discharge: 04/26/2021 ? ?Attending Physician:  Stroke, Md, MD, Stroke MD ?Consultant(s):    cardiology Dr. Sherald Barge ?Patient's PCP:  Maximiano Coss, NP ? ?Discharge Diagnoses:  ?Principal Problem: ?  Seizure ?  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 ?Active Problems: ?  Cardiomyopathy    ?  CKD (chronic kidney disease) stage 3, GFR 30-59 ml/min (HCC) ?  GERD (gastroesophageal reflux disease) ?  HTN ?  Allergic rhinitis ?  Malnutrition of moderate degree ?  History of DVT (deep vein thrombosis) ?  Aspiration pneumonia ([place]) ?  History of traumatic brain injury ? ? ?Medications to be continued on Rehab ?Allergies as of 04/26/2021   ? ?   Reactions  ? Levaquin [levofloxacin] Nausea And Vomiting  ? Shellfish Allergy Hives  ? ?  ? ?  ?Medication List  ?  ? ?STOP taking these medications   ? ?gabapentin 100 MG capsule ?Commonly known as: NEURONTIN ?  ?predniSONE 20 MG tablet ?Commonly known as: DELTASONE ?  ?Vitamin D (Cholecalciferol) 25 MCG (1000 UT) Caps ?  ? ?  ? ?TAKE these medications   ? ?amphetamine-dextroamphetamine 20 MG 24 hr capsule ?Commonly known as: Adderall XR ?Take 1 capsule (20 mg total) by mouth daily. ?What changed: Another medication with the same name was removed. Continue taking this medication, and follow the directions you see here. ?  ?aspirin 81 MG EC tablet ?Take 1 tablet (81 mg total) by mouth daily. Swallow whole. ?Start taking on: April 27, 2021 ?  ?azelastine 0.1 % nasal spray ?Commonly known as: ASTELIN ?Place 1 spray into both nostrils 2 (two) times daily. Use in each nostril as directed ?  ?b complex vitamins capsule ?Take 1 capsule by mouth daily. ?  ?buPROPion 300 MG 24 hr tablet ?Commonly known as: WELLBUTRIN XL ?TAKE 1 TABLET(300 MG) BY MOUTH DAILY ?  ?carvedilol 3.125 MG  tablet ?Commonly known as: COREG ?Take 1 tablet (3.125 mg total) by mouth 2 (two) times daily with a meal. ?  ?cetirizine 10 MG tablet ?Commonly known as: ZYRTEC ?Take 1 tablet (10 mg total) by mouth daily. ?  ?clopidogrel 75 MG tablet ?Commonly known as: PLAVIX ?Take 1 tablet (75 mg total) by mouth daily. ?Start taking on: April 27, 2021 ?  ?finasteride 5 MG tablet ?Commonly known as: PROSCAR ?Take 1 tablet (5 mg total) by mouth daily. ?  ?levETIRAcetam 500 MG tablet ?Commonly known as: KEPPRA ?Take 1 tablet (500 mg total) by mouth 2 (two) times daily. ?  ?multivitamin with minerals Tabs tablet ?Take 1 tablet by mouth daily. ?  ?rosuvastatin 20 MG tablet ?Commonly known as: CRESTOR ?Take 1 tablet (20 mg total) by mouth daily. ?Start taking on: April 27, 2021 ?  ? ?  ? ? ?LABORATORY STUDIES ?CBC ?   ?Component Value Date/Time  ? WBC 7.4 04/26/2021 0255  ? RBC 5.38 04/26/2021 0255  ? HGB 15.5 04/26/2021 0255  ? HGB 13.7 04/09/2018 1425  ? HCT 47.7 04/26/2021 0255  ? PLT 451 (H) 04/26/2021 0255  ? PLT 234 04/09/2018 1425  ? MCV 88.7 04/26/2021 0255  ? MCH 28.8 04/26/2021 0255  ? MCHC 32.5 04/26/2021 0255  ? RDW 14.0 04/26/2021 0255  ? LYMPHSABS 1.0 04/23/2021 1335  ? MONOABS 0.6 04/23/2021 1335  ?  EOSABS 0.3 04/23/2021 1335  ? BASOSABS 0.1 04/23/2021 1335  ? ?CMP ?   ?Component Value Date/Time  ? NA 139 04/26/2021 0255  ? NA 142 03/18/2021 0824  ? K 3.8 04/26/2021 0255  ? CL 109 04/26/2021 0255  ? CO2 22 04/26/2021 0255  ? GLUCOSE 89 04/26/2021 0255  ? BUN 18 04/26/2021 0255  ? BUN 21 03/18/2021 0824  ? CREATININE 1.90 (H) 04/26/2021 0255  ? CREATININE 1.45 (H) 04/09/2018 1425  ? CALCIUM 8.6 (L) 04/26/2021 0255  ? PROT 6.4 (L) 04/24/2021 0414  ? ALBUMIN 2.9 (L) 04/26/2021 0255  ? AST 28 04/24/2021 0414  ? AST 22 04/09/2018 1425  ? ALT 15 04/24/2021 0414  ? ALT 19 04/09/2018 1425  ? ALKPHOS 87 04/24/2021 0414  ? BILITOT 0.9 04/24/2021 0414  ? BILITOT 0.5 04/09/2018 1425  ? GFRNONAA 37 (L) 04/26/2021 0255  ? GFRNONAA 48  (L) 04/09/2018 1425  ? GFRAA 49 (L) 09/03/2019 0517  ? GFRAA 56 (L) 04/09/2018 1425  ? ?COAGS ?Lab Results  ?Component Value Date  ? INR 1.2 04/23/2021  ? INR 1.1 07/21/2019  ? INR 1.01 09/20/2016  ? ?Lipid Panel ?   ?Component Value Date/Time  ? CHOL 189 04/24/2021 0414  ? TRIG 199 (H) 04/24/2021 0414  ? HDL 30 (L) 04/24/2021 0414  ? CHOLHDL 6.3 04/24/2021 0414  ? VLDL 40 04/24/2021 0414  ? [place] [employer] 119 (H) 04/24/2021 0414  ? ?HgbA1C  ?Lab Results  ?Component Value Date  ? HGBA1C 5.3 04/23/2021  ? ?Urinalysis ?   ?Component Value Date/Time  ? [place] YELLOW 08/08/2019 1044  ? APPEARANCEUR HAZY (A) 08/08/2019 1044  ? LABSPEC 1.020 08/08/2019 1044  ? PHURINE 5.0 08/08/2019 1044  ? GLUCOSEU NEGATIVE 08/08/2019 1044  ? HGBUR SMALL (A) 08/08/2019 1044  ? [place] NEGATIVE 08/08/2019 1044  ? [place] NEGATIVE 08/08/2019 1044  ? PROTEINUR 30 (A) 08/08/2019 1044  ? NITRITE NEGATIVE 08/08/2019 1044  ? LEUKOCYTESUR MODERATE (A) 08/08/2019 1044  ? ?Urine Drug Screen No results found for: LABOPIA, COCAINSCRNUR, [place], [place], THCU, LABBARB  ?Alcohol Level ?   ?Component Value Date/Time  ? ETH <10 07/21/2019 2316  ? ? ? ?SIGNIFICANT DIAGNOSTIC STUDIES ?CT HEAD WO CONTRAST (5MM) ? ?Result Date: 04/23/2021 ?CLINICAL DATA:  Stroke-like symptoms EXAM: CT HEAD WITHOUT CONTRAST TECHNIQUE: Contiguous axial images were obtained from the base of the skull through the vertex without intravenous contrast. RADIATION DOSE REDUCTION: This exam was performed according to the departmental dose-optimization program which includes automated exposure control, adjustment of the mA and/or kV according to patient size and/or use of iterative reconstruction technique. COMPARISON:  None. FINDINGS: Brain: There is no mass, hemorrhage or extra-axial collection. The size and configuration of the ventricles and extra-axial CSF spaces are normal. The brain parenchyma is normal, without acute or chronic infarction. Vascular: No abnormal  hyperdensity of the major intracranial arteries or dural venous sinuses. No intracranial atherosclerosis. Skull: The visualized skull base, calvarium and extracranial soft tissues are normal. Sinuses/Orbits: No fluid levels or advanced mucosal thickening of the visualized paranasal sinuses. No mastoid or middle ear effusion. The orbits are normal. IMPRESSION: Normal head CT. Electronically Signed   By: Ulyses Jarred M.D.   On: 04/23/2021 22:16  ? ?CT HEAD WO CONTRAST (5MM) ? ?Result Date: 04/23/2021 ?CLINICAL DATA:  Mental status change.  Unknown cause. EXAM: CT HEAD WITHOUT CONTRAST TECHNIQUE: Contiguous axial images were obtained from the base of the skull through the vertex without intravenous contrast. RADIATION DOSE REDUCTION:  This exam was performed according to the departmental dose-optimization program which includes automated exposure control, adjustment of the mA and/or kV according to patient size and/or use of iterative reconstruction technique. COMPARISON:  04/23/2021 FINDINGS: Brain: No evidence of acute infarction, hemorrhage, hydrocephalus, extra-axial collection or mass lesion/mass effect. Vascular: No hyperdense vessel or unexpected calcification. Skull: No osseous abnormality. Sinuses/Orbits: Visualized paranasal sinuses are clear. Visualized mastoid sinuses are clear. Visualized orbits demonstrate no focal abnormality. Other: None IMPRESSION: 1. No acute intracranial findings. Electronically Signed   By: Kathreen Devoid M.D.   On: 04/23/2021 16:03  ? ?MR ANGIO HEAD WO CONTRAST ? ?Result Date: 04/23/2021 ?CLINICAL DATA:  Stroke, hemorrhagic; Stroke/TIA, determine embolic source EXAM: MRI HEAD WITHOUT CONTRAST MRA HEAD WITHOUT CONTRAST TECHNIQUE: Multiplanar, multi-echo pulse sequences of the brain and surrounding structures were acquired without intravenous contrast. Angiographic images of the Circle of Willis were acquired using MRA technique without intravenous contrast. COMPARISON:  July 2021  FINDINGS: MRI HEAD FINDINGS Motion artifact is present. Brain: Seen only on the coronal DWI sequence, there are a few possible small foci of mildly reduced diffusion in the left frontal lobe within the MCA territory. N

## 2021-04-26 NOTE — Discharge Instructions (Signed)
Supplemental Discharge Instructions for  ?Implantable loop recorder Patients ? ?Activity ?No restrictions. DO wear your seatbelt, even if it crosses over the pacemaker site. ? ?WOUND CARE ?Keep the wound area clean and dry.  Remove the dressing the day after you return home (usually 48 hours after the procedure). ?DO NOT SUBMERGE UNDER WATER UNTIL FULLY HEALED (no tub baths, hot tubs, swimming pools, etc.).  ?You  may shower or take a sponge bath after the dressing is removed. DO NOT SOAK the area and do not allow the shower to directly spray on the site. ?If you have tape/steri-strips on your wound, these will fall off; do not pull them off prematurely.   ?No bandage is needed on the site.  DO  NOT apply any creams, oils, or ointments to the wound area. ?If you notice any drainage or discharge from the wound, any swelling, excessive redness or bruising at the site, or if you develop a fever > 101? F after you are discharged home, call the office at once. ? ?Special Instructions ?You are still able to use cellular telephones.  Avoid carrying your cellular phone near your device. ?When traveling through airports, show security personnel your identification card to avoid being screened in the metal detectors.  ?Avoid arc welding equipment, TENS units (transcutaneous nerve stimulators).  Call the office for questions about other devices. ?Avoid electrical appliances that are in poor condition or are not properly grounded. ?Microwave ovens are safe to be near or to operate. ? ? ? ?

## 2021-04-26 NOTE — Progress Notes (Signed)
Inpatient Rehabilitation Admission Medication Review by a Pharmacist ? ?A complete drug regimen review was completed for this patient to identify any potential clinically significant medication issues. ? ?High Risk Drug Classes Is patient taking? Indication by Medication  ?Antipsychotic Yes Compazine- N/V  ?Anticoagulant No   ?Antibiotic Yes Unasyn- asp. PNA started 04/23/2021  ?Opioid No   ?Antiplatelet Yes Aspirin, plavix- CVA prophylaxis  ?Hypoglycemics/insulin No   ?Vasoactive Medication Yes Coreg- hypertension  ?Chemotherapy No   ?Other Yes Proscar- BPH ?Crestor- HLD ?Protonix- GERD ?Wellbutrin- MDD ?Adderall- daytime fatigue/alertness ?Keppra- seizure prophylaxis ?Trazodone- sleep  ? ? ? ?Type of Medication Issue Identified Description of Issue Recommendation(s)  ?Drug Interaction(s) (clinically significant) ?    ?Duplicate Therapy ?    ?Allergy ?    ?No Medication Administration End Date ? Plavix ?Unasyn Per Neuro- DAPT x3 weeks f/b aspirin alone. End date for Plavix is: 05/15/2021 ? ?Unasyn- needs a LOT  ?Incorrect Dose ?    ?Additional Drug Therapy Needed ?    ?Significant med changes from prior encounter (inform family/care partners about these prior to discharge).    ?Other ? PTA meds: ?Zyrtec ?Vitamin D ?Vitamin B complex Restart PTA meds when clinically necessary or at time of discharge, if warranted  ? ? ?Clinically significant medication issues were identified that warrant physician communication and completion of prescribed/recommended actions by midnight of the next day:  No ? ?Name of provider notified for urgent issues identified:  ? ?Time spent performing this drug regimen review (minutes):  30 ? ? ?Lenda Baratta BS, PharmD, BCPS ?Clinical Pharmacist ?04/26/2021 3:25 PM ? ?Contact: [phone] after 3 PM ? ?"Be curious, not judgmental..." -Jamal Maes ?

## 2021-04-26 NOTE — TOC Transition Note (Signed)
Transition of Care (TOC) - CM/SW Discharge Note ? ? ?Patient Details  ?Name: Yuvan Medinger ?MRN: [id number] ?Date of Birth: 12/08/1945 ? ?Transition of Care (TOC) CM/SW Contact:  ?Pollie Friar, RN ?Phone Number: ?04/26/2021, 11:09 AM ? ? ?Clinical Narrative:    ?Patient is discharging to CIR today. CM signing off.  ? ? ?Final next level of care: [place] ?Barriers to Discharge: No Barriers Identified ? ? ?Patient Goals and CMS Choice ?  ?  ?Choice offered to / list presented to : Patient ? ?Discharge Placement ?  ?           ?  ?  ?  ?  ? ?Discharge Plan and Services ?  ?  ?           ?  ?  ?  ?  ?  ?  ?  ?  ?  ?  ? ?Social Determinants of Health (SDOH) Interventions ?  ? ? ?Readmission Risk Interventions ? ?  08/11/2019  ?  3:29 PM  ?Readmission Risk Prevention Plan  ?Transportation Screening Complete  ?PCP or Specialist Appt within 5-7 Days Not Complete  ?Not Complete comments Pt discharging to CIR  ?Home Care Screening Not Complete  ?Home Care Screening Not Completed Comments Pt discharging to CIR  ?Medication Review (RN CM) Complete  ? ? ? ? ? ?

## 2021-04-26 NOTE — Progress Notes (Signed)
Izora Ribas, MD  ?Physician ?Physical Medicine and Rehabilitation ?PMR Pre-admission    ?Signed ?Date of Service:  04/25/2021  4:20 PM ? Related encounter: ED to Hosp-Admission (Current) from 04/23/2021 in [place] ?  ?Signed    ?  ?Show:Clear all ?'[x]'$ Written'[x]'$ Templated'[]'$ Copied ? ?Added by: ?'[x]'$ Cristina Gong, RN'[x]'$ Ranell Patrick Clide Deutscher, MD ? ?'[]'$ Hover for details ?   ?   ?   ?   ?   ?   ?   ?   ?   ?   ?   ?   ?   ?   ?   ?   ?   ?   ?   ?   ?   ?   ?   ?   ?   ?   ?   ?   ?   ?   ?   ?   ?   ?   ?   ?   ?   ?   ?   ?   ?   ?   ?   ?   ?   ?   ?   ?   ?   ?   ?   ?   ?   ?   ?   ?   ?   ?   ?   ?   ?   ?   ?   ?   ?   ?   ?   ?   ?   ?   ?   ?   ?   ?   ?   ?   ?   ?   ?   ?   ?   ?   ?   ?   ?   ?   ?   ?   ?   ?   ?   ?   ?   ?   ?   ?   ?   ?   ?   ?   ?   ?   ?   ?   ?   ?   ?   ?   ?   ?   ?   ?   ?   ?   ?   ?   ?   ?   ?   ?   ?   ?   ?   ?   ?   ?   ?   ?   ?   ?   ?   ?   ?   ?   ?   ?   ?PMR Admission Coordinator Pre-Admission Assessment ?  ?Patient: Gary [name] is an 76 y.o., male ?MRN: [id number] ?DOB: 09-03-1945 ?Height:   ?Weight: 74.8 kg ?  ?Insurance Information ?HMO:     PPO:      PCP:      IPA:      80/20:      OTHER:  ?PRIMARY: [employer] commercial      Policy#: [id number]      Subscriber: pt ?CM Name: Jackelyn Poling      Phone#: [phone]     Fax#: [phone] ?Pre-Cert#: [id number]  approved for 7 days with f/u with Ceasar Mons phone [phone] ext [phone] fax [phone]    Employer:  ?Benefits:  Phone #: [phone]     Name: 4/20 ?  Eff. Date: 01/06/21     Deduct: $500      Out of Pocket Max: $5000      Life Max: none ?CIR: 80%      SNF: 100 % 90 days ?Outpatient: $10 per visit     Co-Pay: 60 combined visits ?Home Health: 100%      Co-Pay: per medical neccesity visits ?DME: 80%     Co-Pay: 20% ?Providers: In network ? ?SECONDARY: Medicare a and b      Policy#: [id number] active 10/06/2012 ?  ?THIRD: Workers Acupuncturist [id number] cae manager Meredeth Ide phone [phone] ?  ?Financial Counselor:       Phone#:  ?  ?The ?Data Collection Information Summary? for patients in Inpatient Rehabilitation Facilities with attached ?Privacy Act [place] Records? was provided and verbally reviewed with: Family ?  ?Emergency Contact Information ?Contact Information   ?  ?  Name Relation Home Work Mobile  ?  Cavan, Bearden Spouse [phone]      ?  Macoy, Rodwell Daughter     [phone]  ?  ?   ?  ?Current Medical History  ?Patient Admitting Diagnosis: CVA ?  ?History of Present Illness: 76 year old male with history of TBI, HTN, HLD, CKD, ? ischemic cardiomyopathy with EF 40 to 45%, DVT, MDD, kidney stones and enlarged prostate. Presented on 04/24/21 for possible seizure episode with arm extension, followed by shaking all over and then aphasia and right facial droop.  TNK given due to aphasia and right facial droop.  ?  ?EEG no seizure mild diffuse encephalopathy. On Keppra. CT head no acute abnormality. MRI repeated with scattered left MCA small infarcts. CTA head and neck unremarkable. 2 D echo EF 35 to 40 % LV global hypokinesis. 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 On no antithrombotic prior to admit. Now on ASA and Plavix for 3 weeks and then ASA alone. Cardiology consulted and LOOP recorder placed. Dr Harrell Gave with cardiology to follow. Added Carvedilol.LDL 136 now on Crestor. SCDs for VTE prophylaxis.  ?  ?Complete NIHSS TOTAL: 2 ?  ?Patient's medical record from [facility] has been reviewed by the rehabilitation admission coordinator and physician. ?  ?Past Medical History  ?    ?Past Medical History:  ?Diagnosis Date  ? ADHD    ? Allergy    ? CKD (chronic kidney disease)    ? GERD (gastroesophageal reflux disease)    ? History of chickenpox    ? History of diverticulitis 2007  ? History of kidney stones    ? HTN (hypertension)    ? Hypertension    ? Reflux    ? Renal disorder    ?  kidney stones  ? TBI (traumatic brain injury)  ([place]) 08/11/2019  ? TBI (traumatic brain injury) ([place]) 08/11/2019  ? Trauma    ?  ?Has the patient had major surgery during 100 days prior to admission? No ?  ?Family History   ?family history includes Heart disease in his mother; Hypertension in his mother; Stroke in his mother. ?  ?Current Medications ?  ?Current Facility-Administered Medications:  ?  acetaminophen (TYLENOL) tablet 650 mg, 650 mg, Oral, Q4H PRN **OR** acetaminophen (TYLENOL) 160 MG/5ML solution 650 mg, 650 mg, Per Tube, Q4H PRN **OR** acetaminophen (TYLENOL) suppository 650 mg, 650 mg, Rectal, Q4H PRN, Amie Portland, MD ?  amphetamine-dextroamphetamine (ADDERALL XR) 24 hr capsule 20 mg, 20 mg, Oral, Daily, Rosalin Hawking, MD, 20 mg at 04/25/21 1124 ?  Ampicillin-Sulbactam (  UNASYN) 3 g in sodium chloride 0.9 % 100 mL IVPB, 3 g, Intravenous, Q8H, Amie Portland, MD, Last Rate: 200 mL/hr at 04/26/21 0230, 3 g at 04/26/21 0230 ?  aspirin EC tablet 81 mg, 81 mg, Oral, Daily, Rosalin Hawking, MD, 81 mg at 04/25/21 1010 ?  azelastine (ASTELIN) 0.1 % nasal spray 1 spray, 1 spray, Each Nare, BID, Rosalin Hawking, MD, 1 spray at 04/25/21 1014 ?  buPROPion (WELLBUTRIN XL) 24 hr tablet 300 mg, 300 mg, Oral, Daily, Rosalin Hawking, MD, 300 mg at 04/25/21 1106 ?  carvedilol (COREG) tablet 3.125 mg, 3.125 mg, Oral, BID WC, Croitoru, Mihai, MD ?  Chlorhexidine Gluconate Cloth 2 % PADS 6 each, 6 each, Topical, Q0600, Amie Portland, MD, 6 each at 04/26/21 [phone] ?  cholecalciferol (VITAMIN D3) tablet 1,000 Units, 1,000 Units, Oral, Daily, Rosalin Hawking, MD, 1,000 Units at 04/25/21 1011 ?  clopidogrel (PLAVIX) tablet 75 mg, 75 mg, Oral, Daily, Rosalin Hawking, MD, 75 mg at 04/25/21 1011 ?  finasteride (PROSCAR) tablet 5 mg, 5 mg, Oral, Daily, Rosalin Hawking, MD, 5 mg at 04/25/21 1012 ?  ipratropium-albuterol (DUONEB) 0.5-2.5 (3) MG/3ML nebulizer solution 3 mL, 3 mL, Nebulization, QID PRN, Amie Portland, MD ?  levETIRAcetam (KEPPRA) tablet 500 mg, 500 mg, Oral, BID, Rosalin Hawking, MD, 500 mg at  04/25/21 2236 ?  multivitamin with minerals tablet 1 tablet, 1 tablet, Oral, Daily, Rosalin Hawking, MD, 1 tablet at 04/25/21 1008 ?  pantoprazole (PROTONIX) EC tablet 40 mg, 40 mg, Oral, Daily, Rosalin Hawking, MD, 40 mg at 04/25/21 1008 ?  rosuvastatin (CRESTOR) tablet 20 mg, 20 mg, Oral, Daily, Rosalin Hawking, MD, 20 mg at 04/25/21 1056 ?  senna-docusate (Senokot-S) tablet 1 tablet, 1 tablet, Oral, QHS PRN, Amie Portland, MD ?  ?Patients Current Diet:  ?Diet Order   ?  ?         ?    Diet regular Room service appropriate? Yes with Assist; Fluid consistency: Thin  Diet effective now       ?  ?  ?   ?  ?  ?   ?  ?Precautions / Restrictions ?Precautions ?Precautions: Fall ?Restrictions ?Weight Bearing Restrictions: No  ?  ?Has the patient had 2 or more falls or a fall with injury in the past year? No ?  ?Prior Activity Level ?Limited Community (1-2x/wk): supervision for all activities due to TBI ?  ?Prior Functional Level ?Self Care: Did the patient need help bathing, dressing, using the toilet or eating? Needed some help ?  ?Indoor Mobility: Did the patient need assistance with walking from room to room (with or without device)? Needed some help ?  ?Stairs: Did the patient need assistance with internal or external stairs (with or without device)? Needed some help ?  ?Functional Cognition: Did the patient need help planning regular tasks such as shopping or remembering to take medications? Needed some help ?  ?Patient Information ?Are you of Hispanic, Latino/a,or Spanish origin?: A. No, not of Hispanic, Latino/a, or Spanish origin ?What is your race?: A. White ?Do you need or want an interpreter to communicate with a doctor or health care staff?: 0. No ?  ?Patient's Response To:  ?Health Literacy and Transportation ?Is the patient able to respond to health literacy and transportation needs?: No ?Health Literacy - How often do you need to have someone help you when you read instructions, pamphlets, or other written material  from your doctor or pharmacy?: Patient unable to respond ?In the past  12 months, has lack of transportation kept you from medical appointments or from getting medications?: No ?In the past 12 months, has lac

## 2021-04-27 LAB — CBC WITH DIFFERENTIAL/PLATELET
Abs Immature Granulocytes: 0.04 10*3/uL (ref 0.00–0.07)
Basophils Absolute: 0.1 10*3/uL (ref 0.0–0.1)
Basophils Relative: 1 %
Eosinophils Absolute: 0.4 10*3/uL (ref 0.0–0.5)
Eosinophils Relative: 4 %
HCT: 45.3 % (ref 39.0–52.0)
Hemoglobin: 15.4 g/dL (ref 13.0–17.0)
Immature Granulocytes: 0 %
Lymphocytes Relative: 11 %
Lymphs Abs: 1 10*3/uL (ref 0.7–4.0)
MCH: 29.8 pg (ref 26.0–34.0)
MCHC: 34 g/dL (ref 30.0–36.0)
MCV: 87.6 fL (ref 80.0–100.0)
Monocytes Absolute: 0.7 10*3/uL (ref 0.1–1.0)
Monocytes Relative: 7 %
Neutro Abs: 7.5 10*3/uL (ref 1.7–7.7)
Neutrophils Relative %: 77 %
Platelets: 492 10*3/uL — ABNORMAL HIGH (ref 150–400)
RBC: 5.17 MIL/uL (ref 4.22–5.81)
RDW: 14 % (ref 11.5–15.5)
WBC: 10 10*3/uL (ref 4.0–10.5)
nRBC: 0 % (ref 0.0–0.2)

## 2021-04-27 MED ORDER — TRAZODONE HCL 50 MG PO TABS
100.0000 mg | ORAL_TABLET | Freq: Every day | ORAL | Status: DC
  Administered 2021-04-27 – 2021-04-29 (×3): 100 mg via ORAL
  Filled 2021-04-27 (×3): qty 2

## 2021-04-27 NOTE — Evaluation (Signed)
Speech Language Pathology Assessment and Plan ? ?Patient Details  ?Name: Gary [name] ?MRN: [id number] ?Date of Birth: 09-23-1945 ? ?SLP Diagnosis: Cognitive Impairments;Aphasia  ?Rehab Potential: Excellent ?ELOS: 7-10 days  ? ? ?Today's Date: 04/27/2021 ?SLP Individual Time: 1005-1100 ?SLP Individual Time Calculation (min): 55 min ? ? ?Hospital Problem: Principal Problem: ?  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[place]) ? ?Past Medical History:  ?Past Medical History:  ?Diagnosis Date  ? ADHD   ? Allergy   ? CKD (chronic kidney disease)   ? GERD (gastroesophageal reflux disease)   ? History of chickenpox   ? History of diverticulitis 2007  ? History of kidney stones   ? HTN (hypertension)   ? Hypertension   ? Reflux   ? Renal disorder   ? kidney stones  ? TBI (traumatic brain injury) ([place]) 08/11/2019  ? TBI (traumatic brain injury) ([place]) 08/11/2019  ? Trauma   ? ?Past Surgical History:  ?Past Surgical History:  ?Procedure Laterality Date  ? LOOP RECORDER INSERTION N/A 04/26/2021  ? Procedure: LOOP RECORDER INSERTION;  Surgeon: Sanda Klein, MD;  Location: [facility];  Service: Cardiovascular;  Laterality: N/A;  ? MINOR REMOVAL OF MANDIBULAR HARDWARE N/A 12/15/2019  ? Procedure: REMOVAL OF RIGHT LATERAL ORBITAL MINIPLATE;  Surgeon: [name], DO;  Location: [facility];  Service: Plastics;  Laterality: N/A;  ? ORIF MANDIBULAR FRACTURE Bilateral 07/27/2019  ? Procedure: OPEN REDUCTION INTERNAL FIXATION (ORIF) OF COMPLEX ZYGOMATIC FRACTURE;  Surgeon: [name], DO;  Location: [facility];  Service: Plastics;  Laterality: Bilateral;  2 hours, please  ? ? ?Assessment / Plan / Recommendation ?Clinical Impression 76 year old male with history of TBI (CIR 2021), HTN, HLD, CKD, ischemic cardiomyopathy with EF 40 to 45%, DVT, MDD, kidney stones and enlarged prostate. Presented on 04/24/21 for possible seizure episode with arm extension, followed by shaking all over and then aphasia and right facial droop.  TNK given.  EEG without seizure but with mild diffuse encephalopathy. CT head no acute abnormality. MRI repeated with scattered left MCA small infarcts. CTA head and neck unremarkable. 2 D echo EF 35 to 40 % LV global hypokinesis. 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 Cardiology consulted and LOOP recorder placed. Therapy evaluations completed with recommendations for CIR due to physical and cognitive-linguistic deficits. Patient admitted 04/26/21.  ? ?Patient has a h/o cognitive deficits at baseline due to previous TBI in 2021, however, patient's overall language was [facility]. Currently, patient demonstrates deficits in recall and problem solving which appear mildly exacerbated from baseline. Patient also demonstrates deficits in word-finding during structured language tasks including generative and divergent naming. Word-finding deficits were also noted at the sentence and conversation level. Patient's overall auditory comprehension appeared [facility] for all tasks assessed with the exception of delayed processing speed which also may be mildly exacerbated from baseline. Patient would benefit from skilled SLP intervention to maximize his cognitive-linguistic functioning and overall functional independence prior to discharge.  ?  ?Skilled Therapeutic Interventions          Administered a cognitive-linguistic evaluation, please see above for details.   ?SLP Assessment ? Patient will need skilled Speech Lanaguage Pathology Services during CIR admission  ?  ?Recommendations ? Oral Care Recommendations: Oral care BID ?Recommendations for Other Services: Neuropsych consult ?Patient destination: Home ?Follow up Recommendations: 24 hour supervision/assistance;Outpatient SLP ?Equipment Recommended: None recommended by SLP  ?  ?SLP Frequency 3 to 5 out of 7 days   ?SLP Duration ? ?SLP Intensity ? ?  SLP Treatment/Interventions 7-10 days ? ?Minumum of 1-2 x/day, 30 to 90 minutes ? ?Cognitive remediation/compensation;Internal/external  aids;Speech/Language facilitation;Cueing hierarchy;Environmental controls;Therapeutic Activities;Functional tasks;Patient/family education   ? ?Pain ?Pain Assessment ?Pain Scale: 0-10 ?Pain Score: 0-No pain ? ?Prior Functioning ?Type of Home: House ? Lives With: Spouse;Daughter ?Available Help at Discharge: Family;Available 24 hours/day;Other (Comment) ? ?SLP Evaluation ?Cognition ?Overall Cognitive Status: History of cognitive impairments - at baseline ?Arousal/Alertness: Awake/alert ?Orientation Level: Oriented X4 ?Memory: Impaired ?Memory Impairment: Decreased short term memory ?Awareness: Impaired ?Problem Solving: Impaired ?Problem Solving Impairment: Functional complex ?Executive Function: Sequencing ?Sequencing: Impaired ?Sequencing Impairment: Functional complex ?Safety/Judgment: Impaired  ?Comprehension ?Auditory Comprehension ?Overall Auditory Comprehension: Appears within functional limits for tasks assessed ?Yes/No Questions: Within Functional Limits ?Basic Biographical Questions: 76-100% accurate ?Basic Immediate Environment Questions: 75-100% accurate ?Complex Questions: 75-100% accurate ?Commands: Within Functional Limits ?One Step Basic Commands: 75-100% accurate ?Two Step Basic Commands: 75-100% accurate ?Multistep Basic Commands: 75-100% accurate ?Conversation: Complex ?EffectiveTechniques: Extra processing time ?Visual Recognition/Discrimination ?Discrimination: Not tested ?Reading Comprehension ?Reading Status: Not tested ?Expression ?Expression ?Primary Mode of Expression: Verbal ?Verbal Expression ?Overall Verbal Expression: Impaired ?Initiation: No impairment ?Automatic Speech: Name;Day of week;Social Response ?Repetition: No impairment ?Naming: No impairment ?Pragmatics: No impairment ?Other Verbal Expression Comments: high-level word-finding deficits ?Written Expression ?Dominant Hand: Right ?Written Expression: Not tested ? ?Care Tool ?Care Tool Cognition ?Ability to hear (with hearing aid  or hearing appliances if normally used Ability to hear (with hearing aid or hearing appliances if normally used): 0. Adequate - no difficulty in normal conservation, social interaction, listening to TV ?  ?Expression of Ideas and Wants Expression of Ideas and Wants: 3. Some difficulty - exhibits some difficulty with expressing needs and ideas (e.g, some words or finishing thoughts) or speech is not clear ?  ?Understanding Verbal and Non-Verbal Content Understanding Verbal and Non-Verbal Content: 3. Usually understands - understands most conversations, but misses some part/intent of message. Requires cues at times to understand  ?Memory/Recall Ability Memory/Recall Ability : That he or she is in a hospital/hospital unit;Current season  ? ? ?Short Term Goals: ?Week 1: SLP Short Term Goal 1 (Week 1): STGs=LTGs due to ELOS ? ?Refer to Care Plan for Long Term Goals ? ?Recommendations for other services: Neuropsych ? ?Discharge Criteria: Patient will be discharged from SLP if patient refuses treatment 3 consecutive times without medical reason, if treatment goals not met, if there is a change in medical status, if patient makes no progress towards goals or if patient is discharged from hospital. ? ?The above assessment, treatment plan, treatment alternatives and goals were discussed and mutually agreed upon: by patient and by family ? ?Gary [name] ?04/27/2021, 3:20 PM ? ? ?

## 2021-04-27 NOTE — Progress Notes (Signed)
Inpatient Rehabilitation  Patient information reviewed and entered into eRehab system by Urijah Arko M. Manus Weedman, M.A., CCC/SLP, PPS Coordinator.  Information including medical coding, functional ability and quality indicators will be reviewed and updated through discharge.    

## 2021-04-27 NOTE — Progress Notes (Signed)
Pt in PT at this time.  Tomeka to re enter the IV consult once the pt in room and ready.  No IV meds due until 1800. ?
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
## 2021-04-27 NOTE — Plan of Care (Signed)
?  Problem: Consults ?Goal: RH STROKE PATIENT EDUCATION ?Description: See Patient Education module for education specifics  ?Outcome: Progressing ?  ?Problem: RH SAFETY ?Goal: RH STG ADHERE TO SAFETY PRECAUTIONS W/ASSISTANCE/DEVICE ?Description: STG Adhere to Safety Precautions With Cues and Reminders. ?Outcome: Progressing ?Goal: RH STG DECREASED RISK OF FALL WITH ASSISTANCE ?Description: STG Decreased Risk of Fall With Supervision Assistance. ?Outcome: Progressing ?  ?Problem: RH COGNITION-NURSING ?Goal: RH STG USES MEMORY AIDS/STRATEGIES W/ASSIST TO PROBLEM SOLVE ?Description: STG Uses Memory Aids/Strategies With Supervision Assistance to Problem Solve. ?Outcome: Progressing ?Goal: RH STG ANTICIPATES NEEDS/CALLS FOR ASSIST W/ASSIST/CUES ?Description: STG Anticipates Needs/Calls for Assist With Cues and Reminders. ?Outcome: Progressing ?  ?Problem: RH KNOWLEDGE DEFICIT ?Goal: RH STG INCREASE KNOWLEDGE OF HYPERTENSION ?Description: Patient will demonstrate knowledge of HTN medications, dietary recommendations, and blood pressure management with educational materials and handouts provided by staff independently at discharge. ?Outcome: Progressing ?Goal: RH STG INCREASE KNOWLEDGE OF STROKE PROPHYLAXIS ?Description: Patient will demonstrate knowledge of medications used to prevent future strokes with educational materials and handouts provided by staff independently at discharge. ?Outcome: Progressing ?  ?
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
## 2021-04-27 NOTE — Progress Notes (Signed)
Pharmacy Antibiotic Note ? ?Gary [name] is a 76 y.o. male admitted on 04/26/2021 with  aspiration pneumonia .  Pharmacy has been consulted for Unasyn dosing. ? ?Plan: ?Unasyn 3 g IV q8h (per Dr. Dagoberto Ligas - will complete total duration of 5 days for treatment) ?Monitor for acute changes in renal function and adjust dose as needed ?F/u clinical status and de-escalate as appropriate ? ? ?Height: '5\' 10"'$  (177.8 cm) ?Weight: 71.2 kg (156 lb 15.5 oz) ?IBW/kg (Calculated) : 73 ? ?Temp (24hrs), Avg:98.1 ?F (36.7 ?C), Min:97.8 ?F (36.6 ?C), Max:98.5 ?F (36.9 ?C) ? ?Recent Labs  ?Lab 04/23/21 ?1335 04/23/21 ?1341 04/23/21 ?1501 04/24/21 ?0414 04/25/21 ?3748 04/25/21 ?2707 04/26/21 ?0255 04/27/21 ?8675  ?WBC 7.7  --   --  9.5  --  9.0 7.4 10.0  ?CREATININE  --  2.10* 2.00*  1.95* 1.88* 1.88*  --  1.90*  --   ?  ?Estimated Creatinine Clearance: 34.9 mL/min (A) (by C-G formula based on SCr of 1.9 mg/dL (H)).   ? ?Allergies  ?Allergen Reactions  ? Levaquin [Levofloxacin] Nausea And Vomiting  ? Shellfish Allergy Hives  ? ? ?Antimicrobials this admission: ?Unasyn 4/18 >> [4/23] ? ?Microbiology results: ?4/19 MRSA PCR: not detected ? ? ?Thank you for allowing pharmacy to be a part of this patient?s care. ? ?Vance Peper, PharmD ?PGY1 Pharmacy Resident ?Phone [phone] ?04/27/2021 10:43 AM  ? ?Please check AMION for all [facility] phone numbers ?After 10:00 PM, call [facility] [phone] ? ? ?
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
## 2021-04-28 NOTE — Progress Notes (Signed)
Physical Therapy Session Note ? ?Patient Details  ?Name: Gary [name] ?MRN: [id number] ?Date of Birth: 1945-01-21 ? ?Today's Date: 04/28/2021 ?PT Individual Time: 6834-1962 ?PT Individual Time Calculation (min): 68 min  ? ?Short Term Goals: ?Week 1:  PT Short Term Goal 1 (Week 1): STGs = LTGs ? ?Skilled Therapeutic Interventions/Progress Updates:  ?   ?Patient in bed with his family in the room upon PT arrival. Patient alert and agreeable to PT session. Patient denied pain during session. ? ?[name] assisted with recounting patient history since last admission. Reports challenges with motivation to maintain daily physical activity, memory and cognitive deficits requiring 12 hour/day of assistance from [facility] for safety, x1 minor fall without injury, and persistent double vision since TBI with slow to minimal improvement over time. Reports patient is set up to start vision therapy and has a surgical consult with an opthalmologic in January 2024. Will include visual assessment and exercises and education on community based activities that are salient to the patient to promote physical activity at d/c as appropriate as part of patient's POC, patient and his wife appreciative. ? ?Discussed medical and life style causes of stroke, signs and symptoms, and recovery. Patient appropriate throughout session with flat affect and delayed processing.  ? ?Therapeutic Activity: ?Bed Mobility: Patient performed supine to sit with mod I for increased time in a flat bed without use of bed rails.  ?Transfers: Patient performed sit to/from stand x6 with supervision-mod I without an AD. Provided verbal cues for forward weight shift to reduce posterior bias x2. ? ?Gait Training:  ?Patient ambulated >150 feet x2 without an AD with CGA progressing to close supervision. Ambulated with decreased gait speed, decreased step length and height, narrow BOS, forward trunk lean, and downward head gaze. Provided verbal cues for erect posture,  looking ahead and visual scanning, and increased gait speed and arm swing for balance. ?Patient ascended/descended 12x6" steps using 1 rail intermittently with supervision. Performed reciprocal gait pattern throughout. Provided min cues for technique and sequencing.  ? ?Neuromuscular Re-ed: ?Berg Balance Test ?Sit to Stand: Able to stand without using hands and stabilize independently ?Standing Unsupported: Able to stand safely 2 minutes ?Sitting with Back Unsupported but Feet Supported on Floor or Stool: Able to sit safely and securely 2 minutes ?Stand to Sit: Sits safely with minimal use of hands ?Transfers: Able to transfer safely, definite need of hands ?Standing Unsupported with Eyes Closed: Needs help to keep from falling ?Standing Ubsupported with Feet Together: Able to place feet together independently and stand for 1 minute with supervision ?From Standing, Reach Forward with Outstretched Arm: Can reach forward >12 cm safely (5") ?From Standing Position, Pick up Object from Floor: Able to pick up shoe, needs supervision ?From Standing Position, Turn to Look Behind Over each Shoulder: Turn sideways only but maintains balance ?Turn 360 Degrees: Needs close supervision or verbal cueing ?Standing Unsupported, Alternately Place Feet on Step/Stool: Able to complete >2 steps/needs minimal assist ?Standing Unsupported, One Foot in Front: Able to plae foot ahead of the other independently and hold 30 seconds ?Standing on One Leg: Tries to lift leg/unable to hold 3 seconds but remains standing independently ?Total Score: 36/56 ?Patient demonstrated increased fall risk noted by score of 36/56 on the [facility] Scale.  ?<45/56 = fall risk, <42/56 = predictive of recurrent falls, <40/56 = 100% fall risk  ?>41 = independent, 21-40 = assistive device, 0-20 = wheelchair level  ?MDC 6.9 (4 pts 45-56, 5 pts 35-44, 7 pts  25-34) ?(ANPTA Core Set of Outcome Measures for Adults with Neurologic Conditions, 2018) ? ?Functional  Gait  Assessment ?Gait Level Surface: Walks 20 ft, slow speed, abnormal gait pattern, evidence for imbalance or deviates 10-15 in outside of the 12 in walkway width. Requires more than 7 sec to ambulate 20 ft. ?Change in Gait Speed: Makes only minor adjustments to walking speed, or accomplishes a change in speed with significant gait deviations, deviates 10-15 in outside the 12 in walkway width, or changes speed but loses balance but is able to recover and continue walking. ?Gait with Horizontal Head Turns: Performs head turns smoothly with slight change in gait velocity (eg, minor disruption to smooth gait path), deviates 6-10 in outside 12 in walkway width, or uses an assistive device. ?Gait with Vertical Head Turns: Performs task with slight change in gait velocity (eg, minor disruption to smooth gait path), deviates 6 - 10 in outside 12 in walkway width or uses assistive device ?Gait and Pivot Turn: Turns slowly, requires verbal cueing, or requires several small steps to catch balance following turn and stop ?Step Over Obstacle: Is able to step over 2 stacked shoe boxes taped together (9 in total height) without changing gait speed. No evidence of imbalance. ?Gait with Narrow Base of Support: Ambulates less than 4 steps heel to toe or cannot perform without assistance. ?Gait with Eyes Closed: Walks 20 ft, slow speed, abnormal gait pattern, evidence for imbalance, deviates 10-15 in outside 12 in walkway width. Requires more than 9 sec to ambulate 20 ft. ?Ambulating Backwards: Walks 20 ft, slow speed, abnormal gait pattern, evidence for imbalance, deviates 10-15 in outside 12 in walkway width. ?Steps: Alternating feet, must use rail. ?Total Score: 14/30 ?Patient demonstrates increased fall risk as noted by score of 14/30 on  Functional Gait Assessment.   ?<22/30 = predictive of falls, <20/30 = fall in 6 months, <18/30 = predictive of falls in PD ?MCID: 5 points stroke population, 4 points geriatric  population ?(ANPTA Core Set of Outcome Measures for Adults with Neurologic Conditions, 2018) ? ?Five times Sit to Stand Test (FTSS) ?Method: ?Use a straight back chair with a solid seat that is 17-18? high. Ask participant to sit on the chair with arms folded across their chest.   ?Instructions: ??Stand up and sit down as quickly as possible 5 times, keeping your arms folded across your chest.?   ?Measurement: ?Stop timing when the participant touches the chair in sitting the 5th time. ?TIME: 21 sec ?Cut off scores indicative of increased fall risk: >12 sec CVA, >16 sec PD, >13 sec vestibular ?(ANPTA Core Set of Outcome Measures for Adults with Neurologic Conditions, 2018) ? ?[name] demonstrated ambulatory tranfers to/from the bathroom using CGA with the gait belt while providing safe guarding throughout. Cleared patient's wife for transfers in the room during session. Nursing staff made aware. ? ?Patient in recliner with his family in the room at end of session with breaks locked, no alarm set, and all needs within reach.  ? ?Therapy Documentation ?Precautions:  ?Precautions ?Precautions: Fall ?Restrictions ?Weight Bearing Restrictions: No ? ? ? ?Therapy/Group: Individual Therapy ? ?Doreene Burke PT, DPT ? ?04/28/2021, 2:24 PM  ?

## 2021-04-28 NOTE — Progress Notes (Signed)
?                                                       PROGRESS NOTE ? ? ?Subjective/Complaints: ? ?Pt reports was very sleepy- didn't have therapy right now, so didn't want to wake up.  ? ?Ate ~25% of breakfast.  ? ?ROS: ?Limited by sedation- sleeping ? ?Objective: ?  ?No results found. ? ?Recent Labs  ?  04/26/21 ?0255 04/27/21 ?8242  ?WBC 7.4 10.0  ?HGB 15.5 15.4  ?HCT 47.7 45.3  ?PLT 451* 492*  ? ?Recent Labs  ?  04/26/21 ?0255  ?NA 139  ?K 3.8  ?CL 109  ?CO2 22  ?GLUCOSE 89  ?BUN 18  ?CREATININE 1.90*  ?CALCIUM 8.6*  ? ? ?Intake/Output Summary (Last 24 hours) at 04/28/2021 1402 ?Last data filed at 04/28/2021 3536 ?Gross per 24 hour  ?Intake 900.94 ml  ?Output 300 ml  ?Net 600.94 ml  ?  ? ?  ? ?Physical Exam: ?Vital Signs ?Blood pressure (!) 102/50, pulse 74, temperature (!) 97.5 ?F (36.4 ?C), resp. rate 14, height '5\' 10"'$  (1.778 m), weight 71.2 kg, SpO2 92 %. ? ? ? ? ?General: woke briefly to say go away; supine in bed; NAD ?HENT: conjugate gaze; oropharynx moist ?CV: regular rate; no JVD ?Pulmonary: CTA B/L; no W/R/R- good air movement ?GI: soft, NT, ND, (+)BS ?Psychiatric: appropriate- sleepy ?Neurological: sleeping- woke briefly.  ?Skin: ?   General: Skin is warm and dry.  ?   Comments: Loop site ok, burn on right arm ?Neurological:  ?   Mental Status: He is alert and oriented to person, place, and time. +paraphasias (sleep for soup), mild dysarthria, sensation intact in bilateral feet. 5/5 strength throughout. ? ?Assessment/Plan: ?1. Functional deficits which require 3+ hours per day of interdisciplinary therapy in a comprehensive inpatient rehab setting. ?Physiatrist is providing close team supervision and 24 hour management of active medical problems listed below. ?Physiatrist and rehab team continue to assess barriers to discharge/monitor patient progress toward functional and medical goals ? ?Care Tool: ? ?Bathing ?   ?Body parts bathed by patient: Right arm, Left arm, Chest, Abdomen, Front perineal  area, Buttocks, Right upper leg, Left upper leg, Right lower leg, Left lower leg, Face  ?   ?  ?  ?Bathing assist Assist Level: Minimal Assistance - Patient > 75% ?  ?  ?Upper Body Dressing/Undressing ?Upper body dressing   ?What is the patient wearing?: Pull over shirt ?   ?Upper body assist Assist Level: Minimal Assistance - Patient > 75% ?   ?Lower Body Dressing/Undressing ?Lower body dressing ? ? ?   ?What is the patient wearing?: Pants ? ?  ? ?Lower body assist Assist for lower body dressing: Minimal Assistance - Patient > 75% ?   ? ?Toileting ?Toileting    ?Toileting assist Assist for toileting: Supervision/Verbal cueing ?  ?  ?Transfers ?Chair/bed transfer ? ?Transfers assist ?   ? ?Chair/bed transfer assist level: Minimal Assistance - Patient > 75% ?  ?  ?Locomotion ?Ambulation ? ? ?Ambulation assist ? ?   ? ?Assist level: Minimal Assistance - Patient > 75% ?Assistive device: Hand held assist ?Max distance: 150  ? ?Walk 10 feet activity ? ? ?Assist ?   ? ?Assist level: Minimal Assistance - Patient > 75% ?Assistive device: Hand  held assist  ? ?Walk 50 feet activity ? ? ?Assist   ? ?Assist level: Minimal Assistance - Patient > 75% ?Assistive device: Hand held assist  ? ? ?Walk 150 feet activity ? ? ?Assist   ? ?Assist level: Minimal Assistance - Patient > 75% ?Assistive device: Hand held assist ?  ? ?Walk 10 feet on uneven surface  ?activity ? ? ?Assist   ? ? ?Assist level: Minimal Assistance - Patient > 75% ?Assistive device: Hand held assist  ? ?Wheelchair ? ? ? ? ?Assist Is the patient using a wheelchair?: No ?  ?  ? ?  ?   ? ? ?Wheelchair 50 feet with 2 turns activity ? ? ? ?Assist ? ?  ?  ? ? ?   ? ?Wheelchair 150 feet activity  ? ? ? ?Assist ?   ? ? ?   ? ?Blood pressure (!) 102/50, pulse 74, temperature (!) 97.5 ?F (36.4 ?C), resp. rate 14, height '5\' 10"'$  (1.778 m), weight 71.2 kg, SpO2 92 %. ? ?Medical Problem List and Plan: ?1. 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            -patient may shower ?            -ELOS/Goals: 5-7 days ?            -continue Keppra ?            Continue CIR- PT, OT and SLP ? ?2.  Antithrombotics: ?-DVT/anticoagulation:  Mechanical:  Antiembolism stockings, knee (TED hose) Bilateral lower extremities ?            -antiplatelet therapy: Plavix (through 511), aspirin for 3 weeks then aspirin only ?3. Pain in toes/feet: not present currently. Ordered Tylenol as needed ?4. Depression: LCSW to evaluate and provide emotional support ?            --reactive depression: continue Wellbutrin. Please add to neuropsych list ?            -antipsychotic agents: n/a ?5. Neuropsych: This patient is not capable of making decisions on his own behalf. ?6. Skin/Wound Care: Routine skin care checks ?7. 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            --seen by Dr. Sima Matas on 04/15/2021 ?9: Hypertension: continue Coreg ?10: Possible aspiration pneumonia: continue Unasyn>>?? Duration ?            --CXR on 4/19>>persistent opacities bil lung bases R>L ? 4/22- will con't Ampicillin for a total of 5 days basedon CXR results.  ? 4/23- pharmacy was asking if should stop Amp- since pt had Sx's initially, think we need to continue, based on CXR results.  ?11: Dyslipidemia: continue Crestor ?12: Reflux: continue Protonix ?13: ADD: continue Adderall ?14: BPH: Continue Proscar ?15: Cardiomyopathy: looped recorder placed 4/21 ?            --follow-up with Dr. 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 4/22- Cr up to 1.90- usually 1.88-2.1- will con't to monitor ?17. Insomnia ? 4/22- will add Trazodone 100 mg QHS for sleep ? 4/23- slept better- but maybe too much- might need to decrease if still sleepy in AM ? ? ?LOS: ?2 days ?A FACE TO FACE EVALUATION WAS PERFORMED ? ?Miriam Kestler ?04/28/2021, 2:02 PM  ? ? ? ?

## 2021-04-28 NOTE — Plan of Care (Signed)
?  Problem: Consults ?Goal: RH STROKE PATIENT EDUCATION ?Description: See Patient Education module for education specifics  ?Outcome: Progressing ?  ?Problem: RH SAFETY ?Goal: RH STG ADHERE TO SAFETY PRECAUTIONS W/ASSISTANCE/DEVICE ?Description: STG Adhere to Safety Precautions With Cues and Reminders. ?Outcome: Progressing ?Goal: RH STG DECREASED RISK OF FALL WITH ASSISTANCE ?Description: STG Decreased Risk of Fall With Supervision Assistance. ?Outcome: Progressing ?  ?Problem: RH COGNITION-NURSING ?Goal: RH STG USES MEMORY AIDS/STRATEGIES W/ASSIST TO PROBLEM SOLVE ?Description: STG Uses Memory Aids/Strategies With Supervision Assistance to Problem Solve. ?Outcome: Progressing ?Goal: RH STG ANTICIPATES NEEDS/CALLS FOR ASSIST W/ASSIST/CUES ?Description: STG Anticipates Needs/Calls for Assist With Cues and Reminders. ?Outcome: Progressing ?  ?Problem: RH KNOWLEDGE DEFICIT ?Goal: RH STG INCREASE KNOWLEDGE OF HYPERTENSION ?Description: Patient will demonstrate knowledge of HTN medications, dietary recommendations, and blood pressure management with educational materials and handouts provided by staff independently at discharge. ?Outcome: Progressing ?Goal: RH STG INCREASE KNOWLEDGE OF STROKE PROPHYLAXIS ?Description: Patient will demonstrate knowledge of medications used to prevent future strokes with educational materials and handouts provided by staff independently at discharge. ?Outcome: Progressing ?  ?

## 2021-04-29 DIAGNOSIS — N1832 Chronic kidney disease, stage 3b: Secondary | ICD-10-CM

## 2021-04-29 DIAGNOSIS — G479 Sleep disorder, unspecified: Secondary | ICD-10-CM

## 2021-04-29 DIAGNOSIS — I10 Essential (primary) hypertension: Secondary | ICD-10-CM

## 2021-04-29 DIAGNOSIS — F329 Major depressive disorder, single episode, unspecified: Secondary | ICD-10-CM

## 2021-04-29 NOTE — Progress Notes (Signed)
Speech Language Pathology Daily Session Note ? ?Patient Details  ?Name: Gary [name] ?MRN: [id number] ?Date of Birth: 1945/02/18 ? ?Today's Date: 04/29/2021 ?SLP Individual Time: 1300-1400 ?SLP Individual Time Calculation (min): 60 min ? ?Short Term Goals: ?Week 1: SLP Short Term Goal 1 (Week 1): STGs=LTGs due to ELOS ? ?Skilled Therapeutic Interventions: Skilled treatment session focused on cognitive goals. SLP facilitated session by providing Mod A verbal cues for recall of his current medications and their functions. Mod verbal cues were also needed for functional problem solving while organizing a BID pill box. Patient recalled events from previous therapy sessions with supervision verbal cues. Patient also asking appropriate questions regarding stroke risk, etc. Patient requested to get back into bed at end of session and left with alarm on and all needs within reach.  ?   ? ?Pain ?No/Denies Pain  ? ?Therapy/Group: Individual Therapy ? ?[name] ?04/29/2021, 3:13 PM ?

## 2021-04-29 NOTE — IPOC Note (Signed)
Overall Plan of Care (IPOC) ?Patient Details ?Name: Gary [name] ?MRN: [id number] ?DOB: 1945/01/24 ? ?Admitting Diagnosis: 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[place]) ? ?Hospital Problems: Principal Problem: ?  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[place]) ? ? ? ? Functional Problem List: ?Nursing Edema, Endurance, Medication Management, Motor, Safety  ?PT Balance, Endurance, Motor, Safety  ?OT Balance, Cognition, Endurance, Motor, Perception, Safety, Vision  ?SLP Cognition, Linguistic  ?TR    ?    ? Basic ADL?s: ?OT Grooming, Bathing, Dressing, Toileting  ? ?  Advanced  ADL?s: ?OT    ?   ?Transfers: ?PT Bed Mobility, Bed to Chair, Car  ?OT Toilet, Tub/Shower  ? ?  Locomotion: ?PT Ambulation, Stairs  ? ?  Additional Impairments: ?OT Fuctional Use of Upper Extremity  ?SLP Social Cognition, Communication ?expression ?Problem Solving, Memory, Awareness  ?TR    ? ? ?Anticipated Outcomes ?Item Anticipated Outcome  ?Self Feeding no goal  ?Swallowing ?   ?  ?Basic self-care ? S  ?Toileting ? S ?  ?Bathroom Transfers S  ?Bowel/Bladder ? n/a  ?Transfers ? mod I transfers  ?Locomotion ? S gait and S stairs  ?Communication ? Supervision  ?Cognition ? Supervision  ?Pain ? n/a  ?Safety/Judgment ? supervision  ? ?Therapy Plan: ?PT Intensity: Minimum of 1-2 x/day ,45 to 90 minutes ?PT Frequency: 5 out of 7 days ?PT Duration Estimated Length of Stay: 5 to 7 days ?OT Intensity: Minimum of 1-2 x/day, 45 to 90 minutes ?OT Frequency: 5 out of 7 days ?OT Duration/Estimated Length of Stay: 6-9 ?SLP Intensity: Minumum of 1-2 x/day, 30 to 90 minutes ?SLP Frequency: 3 to 5 out of 7 days ?SLP Duration/Estimated Length of Stay: 7-10 days  ? ?Due to the current state of emergency, patients may not be receiving their 3-hours of Medicare-mandated therapy. ? ? Team Interventions: ?Nursing Interventions Patient/Family Education, Disease Management/Prevention, Medication Management, Discharge Planning  ?PT interventions Ambulation/gait training,  Training and development officer, Discharge planning, Neuromuscular re-education, Functional mobility training, DME/adaptive equipment instruction, Patient/family education, Stair training, Therapeutic Activities, UE/LE Strength taining/ROM, UE/LE Coordination activities, Visual/perceptual remediation/compensation  ?OT Interventions Balance/vestibular training, Discharge planning, Pain management, Self Care/advanced ADL retraining, Therapeutic Activities, UE/LE Coordination activities, Visual/perceptual remediation/compensation, Therapeutic Exercise, Skin care/wound managment, Patient/family education, Functional mobility training, Cognitive remediation/compensation, Disease mangement/prevention, Community reintegration, DME/adaptive equipment instruction, Neuromuscular re-education, Psychosocial support, Splinting/orthotics, UE/LE Strength taining/ROM, Wheelchair propulsion/positioning  ?SLP Interventions Cognitive remediation/compensation, Internal/external aids, Speech/Language facilitation, Cueing hierarchy, Environmental controls, Therapeutic Activities, Functional tasks, Patient/family education  ?TR Interventions    ?SW/CM Interventions Discharge Planning, Psychosocial Support, Patient/Family Education  ? ?Barriers to Discharge ?MD  Medical stability  ?Nursing Decreased caregiver support, Home environment access/layout, Lack of/limited family support, Insurance for SNF coverage ?2 level, 2 steps, no rail. Full flight stairs, left rail, bed/bath 2nd level. Discharging with spouse who works from home. Hx. TBI, has caregiver supervision at home currently.  ?PT Inaccessible home environment ?   ?OT   ?   ?SLP   ?   ?SW Decreased caregiver support, Lack of/limited family support ?Wife works full time and cares for her 20 y.o. granddaughter. Only other support is an aide 12hrs per day/7 days a week  ? ?Team Discharge Planning: ?Destination: PT-Home ,OT- Home , SLP-Home ?Projected Follow-up: PT-Home health PT, OT-   Outpatient OT, SLP-24 hour supervision/assistance, Outpatient SLP ?Projected Equipment Needs: PT-To be determined, OT- To be determined, SLP-None recommended by SLP ?Equipment Details: PT- , OT-  ?Patient/family involved in discharge planning: PT- Patient, Family member/caregiver,  OT-Patient, SLP-Patient, Family member/caregiver ? ?MD ELOS: 7-10 days ?Medical Rehab Prognosis:  Excellent ?Assessment: The patient has been admitted for CIR therapies with the diagnosis of left MCA infarct, likely embolic, hx of TBI. The team will be addressing functional mobility, strength, stamina, balance, safety, adaptive techniques and equipment, self-care, bowel and bladder mgt, patient and caregiver education, NMR, cognition, communication, mood/behavior, community reentry. Goals have been set at supervision with self-care, supervision to mod I with mobility and supervision with cognition. Anticipated discharge destination is home with spouse. ? ?Due to the current state of emergency, patients may not be receiving their 3 hours per day of Medicare-mandated therapy.  ? ? ?Meredith Staggers, MD, FAAPMR   ? ? ?See Team Conference Notes for weekly updates to the plan of care  ?

## 2021-04-29 NOTE — Progress Notes (Signed)
Physical Therapy Session Note ? ?Patient Details  ?Name: Gary [name] ?MRN: [id number] ?Date of Birth: 1945-01-25 ? ?Today's Date: 04/29/2021 ?PT Individual Time: 8850-2774 ?PT Individual Time Calculation (min): 70 min  ? ?Short Term Goals: ?Week 1:  PT Short Term Goal 1 (Week 1): STGs = LTGs ? ?Skilled Therapeutic Interventions/Progress Updates:  ?   ?Patient in bed upon PT arrival. Patient alert and agreeable to PT session. Patient denied pain during session. ? ?Patient with questions regarding his visual deficits. Noted double vision worse on R>L, patient unclear if visual deficits worse since his stroke.  ? ?Therapeutic Activity: ?Bed Mobility: Patient performed supine to sit with mod I for increased time.  ?Transfers: Patient performed sit to/from stand with supervision-mod throughout session. Provided verbal cues for forward weight shift x2. ? ?Gait Training:  ?Patient ambulated >150 feet x2 without an AD with CGA progressing to close supervision. Ambulated with intermittent scissoring and variable foot placement, slow R veering, and mild forward trunk flexion and downward head gaze. Provided verbal cues for looking ahead and visual scanning, increased gait speed and arm swing for balance, and attention to step width and symmetry. ?6 Min Walk Test:  ?Instructed patient to ambulate as quickly and as safely as possible for 6 minutes using LRAD. Patient was allowed to take standing rest breaks without stopping the test, but if the patient required a sitting rest break the clock would be stopped and the test would be over.  ?Results: 1013 feet (308.7 meters, Avg speed 0.85 m/s) without an AD with close supervision. Results indicate that the patient has reduced endurance with ambulation compared to age matched norms.  ?Age Matched Norms: 63-79 yo M: 527 meters ?MDC: 58.21 meters (190.98 feet) or 50 meters ?(ANPTA Core Set of Outcome Measures for Adults with Neurologic Conditions, 2018) ? ?Reviewed results and  interpretation of all outcome measures performed yesterday and today and indication for increased fall risk due to current scores with goals to improve scores and reduce fall risk throughout stay. Patient in agreement with POC. ? ?Neuromuscular Re-ed: ?Patient performed the following dynamic standing balance activities for improved visual scanning, cognitive retraining, and balance with functional mobility: ?-walking and counting how many colored dots were placed in a hallway, counted 8/10 ?-walking retrieving colored dots placed on R/L walls in the hallway, located 8/10 (1 R, 1 L), required 2 more passes to locate last 2 dots, required mod cues to keep track of the number of dots he was looking for ?-kicking a ball against the wall x10 min taking turns with PT focused on tracking the ball and retrieval when it got out of the area, including kicking it back to the area or bending down to pick it up ?-Played a round of HORSE with the basket ball hoop and soft mesh ball, patient was unfamiliar with the game and required mod-max cues for recall of the rules and keeping track of the letters accumulated, patient lost the game by 1 letter ?Patient maintained balance with supervision for all activities, demonstrated use of reaching and stepping strategies for minor LOBs, demonstrated good safety awareness when navigating around objects and bending down to pick up the ball, did require max cues to retrieve the ball, as he stated "I usually let Eduard Clos (granddaughter) get it." ?Patient did not require rest breaks between activities, demonstrating good activity tolerance with tasks performed.  ? ?Patient in recliner in the room at end of session with breaks locked, seat belt alarm set, and all needs  within reach.  ? ?Therapy Documentation ?Precautions:  ?Precautions ?Precautions: Fall ?Restrictions ?Weight Bearing Restrictions: No ? ? ? ?Therapy/Group: Individual Therapy ? ?Doreene Burke PT, DPT ? ?04/29/2021, 12:10 PM   ?

## 2021-04-29 NOTE — Care Management (Addendum)
Inpatient Rehabilitation Center ?Individual Statement of Services ? ?Patient Name:  Gary [name]  ?Date:  04/29/2021 ? ?Welcome to the [facility].  Our goal is to provide you with an individualized program based on your diagnosis and situation, designed to meet your specific needs.  With this comprehensive rehabilitation program, you will be expected to participate in at least 3 hours of rehabilitation therapies Monday-Friday, with modified therapy programming on the weekends. ? ?Your rehabilitation program will include the following services:  Physical Therapy (PT), Occupational Therapy (OT), Speech Therapy (ST), 24 hour per day rehabilitation nursing, Therapeutic Recreaction (TR), Psychology, Neuropsychology, Care Coordinator, Rehabilitation Medicine, Nutrition Services, Pharmacy Services, and Other ? ?Weekly team conferences will be held on Tuesdays to discuss your progress.  Your Inpatient Rehabilitation Care Coordinator will talk with you frequently to get your input and to update you on team discussions.  Team conferences with you and your family in attendance may also be held. ? ?Expected length of stay: 5-9 days ? ?Overall anticipated outcome: Supervision ? ?Depending on your progress and recovery, your program may change. Your Inpatient Rehabilitation Care Coordinator will coordinate services and will keep you informed of any changes. Your Inpatient Rehabilitation Care Coordinator's name and contact numbers are listed  below. ? ?The following services may also be recommended but are not provided by the [facility]:  ?Driving Evaluations ?Home Health Rehabiltiation Services ?Outpatient Rehabilitation Services ?Vocational Rehabilitation ?  ?Arrangements will be made to provide these services after discharge if needed.  Arrangements include referral to agencies that provide these services. ? ?Your insurance has been verified to be:  [employer] ? ?Your primary  doctor is:  Maximiano Coss ? ?Pertinent information will be shared with your doctor and your insurance company. ? ?Inpatient Rehabilitation Care Coordinator:  Cathleen Corti [phone] or (C) [phone] ? ?Information discussed with and copy given to patient by: [name], 04/29/2021, 9:59 AM    ?

## 2021-04-29 NOTE — Plan of Care (Signed)
?  Problem: Consults ?Goal: RH STROKE PATIENT EDUCATION ?Description: See Patient Education module for education specifics  ?Outcome: Progressing ?  ?Problem: RH SAFETY ?Goal: RH STG ADHERE TO SAFETY PRECAUTIONS W/ASSISTANCE/DEVICE ?Description: STG Adhere to Safety Precautions With Cues and Reminders. ?Outcome: Progressing ?Goal: RH STG DECREASED RISK OF FALL WITH ASSISTANCE ?Description: STG Decreased Risk of Fall With Supervision Assistance. ?Outcome: Progressing ?  ?Problem: RH COGNITION-NURSING ?Goal: RH STG USES MEMORY AIDS/STRATEGIES W/ASSIST TO PROBLEM SOLVE ?Description: STG Uses Memory Aids/Strategies With Supervision Assistance to Problem Solve. ?Outcome: Progressing ?Goal: RH STG ANTICIPATES NEEDS/CALLS FOR ASSIST W/ASSIST/CUES ?Description: STG Anticipates Needs/Calls for Assist With Cues and Reminders. ?Outcome: Progressing ?  ?Problem: RH KNOWLEDGE DEFICIT ?Goal: RH STG INCREASE KNOWLEDGE OF HYPERTENSION ?Description: Patient will demonstrate knowledge of HTN medications, dietary recommendations, and blood pressure management with educational materials and handouts provided by staff independently at discharge. ?Outcome: Progressing ?Goal: RH STG INCREASE KNOWLEDGE OF STROKE PROPHYLAXIS ?Description: Patient will demonstrate knowledge of medications used to prevent future strokes with educational materials and handouts provided by staff independently at discharge. ?Outcome: Progressing ?  ?

## 2021-04-29 NOTE — Discharge Summary (Signed)
Physician Discharge Summary  ?Patient ID: ?Gary [name] ?MRN: [id number] ?DOB/AGE: [birth year] 76 y.o. ? ?Admit date: 04/26/2021 ?Discharge date: 05/05/2021 ? ?Discharge Diagnoses:  ?Principal Problem: ?  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[place]) ?Acute problems: ?Functional deficits secondary to left MCA stroke ?Pulmonary infiltrates ?Short term memory deficits ?Depression ?Hypertension ?Dyslipidemia ?G-E reflux ?ADD ?BPH ?Cardiomyopathy ?CKD 3b ?Insomnia ? ?Discharged Condition: stable ? ?Significant Diagnostic Studies: ?CT HEAD WO CONTRAST (5MM) ? ?Result Date: 04/23/2021 ?CLINICAL DATA:  Stroke-like symptoms EXAM: CT HEAD WITHOUT CONTRAST TECHNIQUE: Contiguous axial images were obtained from the base of the skull through the vertex without intravenous contrast. RADIATION DOSE REDUCTION: This exam was performed according to the departmental dose-optimization program which includes automated exposure control, adjustment of the mA and/or kV according to patient size and/or use of iterative reconstruction technique. COMPARISON:  None. FINDINGS: Brain: There is no mass, hemorrhage or extra-axial collection. The size and configuration of the ventricles and extra-axial CSF spaces are normal. The brain parenchyma is normal, without acute or chronic infarction. Vascular: No abnormal hyperdensity of the major intracranial arteries or dural venous sinuses. No intracranial atherosclerosis. Skull: The visualized skull base, calvarium and extracranial soft tissues are normal. Sinuses/Orbits: No fluid levels or advanced mucosal thickening of the visualized paranasal sinuses. No mastoid or middle ear effusion. The orbits are normal. IMPRESSION: Normal head CT. Electronically Signed   By: Ulyses Jarred M.D.   On: 04/23/2021 22:16  ? ?CT HEAD WO CONTRAST (5MM) ? ?Result Date: 04/23/2021 ?CLINICAL DATA:  Mental status change.  Unknown cause. EXAM: CT HEAD WITHOUT CONTRAST TECHNIQUE: Contiguous axial images were obtained from the base of  the skull through the vertex without intravenous contrast. RADIATION DOSE REDUCTION: This exam was performed according to the departmental dose-optimization program which includes automated exposure control, adjustment of the mA and/or kV according to patient size and/or use of iterative reconstruction technique. COMPARISON:  04/23/2021 FINDINGS: Brain: No evidence of acute infarction, hemorrhage, hydrocephalus, extra-axial collection or mass lesion/mass effect. Vascular: No hyperdense vessel or unexpected calcification. Skull: No osseous abnormality. Sinuses/Orbits: Visualized paranasal sinuses are clear. Visualized mastoid sinuses are clear. Visualized orbits demonstrate no focal abnormality. Other: None IMPRESSION: 1. No acute intracranial findings. Electronically Signed   By: Kathreen Devoid M.D.   On: 04/23/2021 16:03  ? ?MR ANGIO HEAD WO CONTRAST ? ?Result Date: 04/23/2021 ?CLINICAL DATA:  Stroke, hemorrhagic; Stroke/TIA, determine embolic source EXAM: MRI HEAD WITHOUT CONTRAST MRA HEAD WITHOUT CONTRAST TECHNIQUE: Multiplanar, multi-echo pulse sequences of the brain and surrounding structures were acquired without intravenous contrast. Angiographic images of the Circle of Willis were acquired using MRA technique without intravenous contrast. COMPARISON:  July 2021 FINDINGS: MRI HEAD FINDINGS Motion artifact is present. Brain: Seen only on the coronal DWI sequence, there are a few possible small foci of mildly reduced diffusion in the left frontal lobe within the MCA territory. No corresponding T2 hyperintensity, noting that the T2 FLAIR sequence utilized together with motion artifact results in limited contrast resolution. There is no intracranial mass or mass effect. Thin subdural collections on the prior study have resolved. There is no hydrocephalus. Vascular: Major vessel flow voids at the skull base are preserved. Skull and upper cervical spine: Marrow signal is grossly within normal limits. Sinuses/Orbits:  Nonspecific left maxillary sinus mucosal thickening and small air-fluid level. Orbits are unremarkable. Other: Mastoid air cells are clear. MRA HEAD FINDINGS Significant motion artifact is present. Anterior circulation: Preserved flow related enhancement of the intracranial internal carotid arteries. M1 MCA segments are  patent. No definite M2 occlusion, but these segments are poorly evaluated. No proximal anterior cerebral artery occlusion. Posterior circulation: The intracranial vertebral arteries, basilar artery, and proximal posterior cerebral arteries are patent. IMPRESSION: Suboptimal evaluation due to motion degradation. Few possible punctate acute infarcts in the left frontal low within the MCA territory. No proximal intracranial vessel occlusion. M2 MCA segments are not well evaluated. Electronically Signed   By: Macy Mis M.D.   On: 04/23/2021 15:14  ? ?MR BRAIN WO CONTRAST ? ?Result Date: 04/24/2021 ?CLINICAL DATA:  Stroke follow-up EXAM: MRI HEAD WITHOUT CONTRAST TECHNIQUE: Multiplanar, multiecho pulse sequences of the brain and surrounding structures were obtained without intravenous contrast. COMPARISON:  04/23/2021 FINDINGS: Brain: There are numerous small foci of abnormal diffusion restriction throughout the left MCA territory. The largest areas are located in the left frontal and parietal cortices and at the posterior insular cortex. No acute ischemia in other vascular distribution. There is petechial hemorrhage at the posterior left insula. There is multifocal hyperintense T2-weighted signal within the white matter. Generalized volume loss without a clear lobar predilection. The midline structures are normal. Vascular: Major flow voids are preserved. Skull and upper cervical spine: Normal calvarium and skull base. Visualized upper cervical spine and soft tissues are normal. Sinuses/Orbits:Mild left maxillary sinus mucosal thickening. Normal orbits. IMPRESSION: 1. Numerous small acute infarcts  throughout the left MCA territory. 2. Petechial hemorrhage at the posterior left insula. Heidelberg classification 1b: HI2, confluent petechiae, no mass effect. Electronically Signed   By: Ulyses Jarred M.D.   On: 04/24/2021 19:34  ? ?MR BRAIN WO CONTRAST ? ?Result Date: 04/23/2021 ?CLINICAL DATA:  Stroke, hemorrhagic; Stroke/TIA, determine embolic source EXAM: MRI HEAD WITHOUT CONTRAST MRA HEAD WITHOUT CONTRAST TECHNIQUE: Multiplanar, multi-echo pulse sequences of the brain and surrounding structures were acquired without intravenous contrast. Angiographic images of the Circle of Willis were acquired using MRA technique without intravenous contrast. COMPARISON:  July 2021 FINDINGS: MRI HEAD FINDINGS Motion artifact is present. Brain: Seen only on the coronal DWI sequence, there are a few possible small foci of mildly reduced diffusion in the left frontal lobe within the MCA territory. No corresponding T2 hyperintensity, noting that the T2 FLAIR sequence utilized together with motion artifact results in limited contrast resolution. There is no intracranial mass or mass effect. Thin subdural collections on the prior study have resolved. There is no hydrocephalus. Vascular: Major vessel flow voids at the skull base are preserved. Skull and upper cervical spine: Marrow signal is grossly within normal limits. Sinuses/Orbits: Nonspecific left maxillary sinus mucosal thickening and small air-fluid level. Orbits are unremarkable. Other: Mastoid air cells are clear. MRA HEAD FINDINGS Significant motion artifact is present. Anterior circulation: Preserved flow related enhancement of the intracranial internal carotid arteries. M1 MCA segments are patent. No definite M2 occlusion, but these segments are poorly evaluated. No proximal anterior cerebral artery occlusion. Posterior circulation: The intracranial vertebral arteries, basilar artery, and proximal posterior cerebral arteries are patent. IMPRESSION: Suboptimal evaluation  due to motion degradation. Few possible punctate acute infarcts in the left frontal low within the MCA territory. No proximal intracranial vessel occlusion. M2 MCA segments are not well evaluated. Elect

## 2021-04-29 NOTE — Progress Notes (Signed)
Occupational Therapy Session Note ? ?Patient Details  ?Name: Michae Grimley ?MRN: [id number] ?Date of Birth: 05/23/1945 ? ?Today's Date: 04/29/2021 ?OT Individual Time: 8177-1165 ?OT Individual Time Calculation (min): 70 min  ? ? ?Short Term Goals: ?Week 1:  OT Short Term Goal 1 (Week 1): STG=LTG d/t ELOS ? ?Skilled Therapeutic Interventions/Progress Updates:  ? Pt received supine with no c/o pain, agreeable to OT session. Pt required mod cueing to take all of his medication, attention vs memory vs slower processing. Wife Candy present and providing info on CLOF since TBI- pt well known to OT. Reports low motivation and cognitive deficits since TBI. He completed ADLs at the sink with mod cueing for initiation/sequencing but CGA overall. He completed 120 ft of functional mobility to the therapy gym with CGA. He completed functional stepping activity, graded with adding resistance via holding 2 6 lb dumbbells, to address functional activity tolerance and dynamic balance. 3x10 repetitions. Discussed visual deficits and provided pt with occluded glasses to trial. He returned to his room and was left supine with all needs met, bed alarm set.  ?  ? ?Therapy Documentation ?Precautions:  ?Precautions ?Precautions: Fall ?Restrictions ?Weight Bearing Restrictions: No ? ?Therapy/Group: Individual Therapy ? ?Curtis Sites ?04/29/2021, 6:33 AM ?

## 2021-04-29 NOTE — Progress Notes (Signed)
?                                                       PROGRESS NOTE ? ? ?Subjective/Complaints: ?Slept a little better last night. Feels that therapy is going ok. Wife feels that he really struggles with energy/cognition later in the day.  ? ?ROS: Patient denies fever, rash, sore throat, blurred vision, dizziness, nausea, vomiting, diarrhea, cough, shortness of breath or chest pain, joint or back/neck pain, headache, or mood change.  ? ?Objective: ?  ?No results found. ? ?Recent Labs  ?  04/27/21 ?1779  ?WBC 10.0  ?HGB 15.4  ?HCT 45.3  ?PLT 492*  ? ?No results for input(s): NA, K, CL, CO2, GLUCOSE, BUN, CREATININE, CALCIUM in the last 72 hours. ? ? ?Intake/Output Summary (Last 24 hours) at 04/29/2021 1025 ?Last data filed at 04/29/2021 3903 ?Gross per 24 hour  ?Intake 486 ml  ?Output --  ?Net 486 ml  ?  ? ?  ? ?Physical Exam: ?Vital Signs ?Blood pressure (!) 124/53, pulse 69, temperature 97.8 ?F (36.6 ?C), temperature source Oral, resp. rate 14, height '5\' 10"'$  (1.778 m), weight 71.2 kg, SpO2 94 %. ? ? ? ? ?Constitutional: No distress . Vital signs reviewed. ?HEENT: NCAT, EOMI, oral membranes moist ?Neck: supple ?Cardiovascular: RRR without murmur. No JVD    ?Respiratory/Chest: CTA Bilaterally without wheezes or rales. Normal effort    ?GI/Abdomen: BS +, non-tender, non-distended ?Ext: no clubbing, cyanosis, or edema ?Psych: pleasant and cooperative  ?Skin: ?   General: Skin is warm and dry.  ?   Comments: Loop site ok, burn on right arm ?Neurological:  ?   Mental Status: He is alert and oriented to person, place, and time. +paraphasias-appear improved, some delays in processing. mild dysarthria, sensation intact in bilateral feet. 4-5/5 strength throughout. ? ?Assessment/Plan: ?1. Functional deficits which require 3+ hours per day of interdisciplinary therapy in a comprehensive inpatient rehab setting. ?Physiatrist is providing close team supervision and 24 hour management of active medical problems listed  below. ?Physiatrist and rehab team continue to assess barriers to discharge/monitor patient progress toward functional and medical goals ? ?Care Tool: ? ?Bathing ?   ?Body parts bathed by patient: Right arm, Left arm, Chest, Abdomen, Front perineal area, Buttocks, Right upper leg, Left upper leg, Right lower leg, Left lower leg, Face  ?   ?  ?  ?Bathing assist Assist Level: Minimal Assistance - Patient > 75% ?  ?  ?Upper Body Dressing/Undressing ?Upper body dressing   ?What is the patient wearing?: Pull over shirt ?   ?Upper body assist Assist Level: Minimal Assistance - Patient > 75% ?   ?Lower Body Dressing/Undressing ?Lower body dressing ? ? ?   ?What is the patient wearing?: Pants, Underwear/pull up ? ?  ? ?Lower body assist Assist for lower body dressing: Minimal Assistance - Patient > 75% ?   ? ?Toileting ?Toileting    ?Toileting assist Assist for toileting: Contact Guard/Touching assist ?  ?  ?Transfers ?Chair/bed transfer ? ?Transfers assist ?   ? ?Chair/bed transfer assist level: Minimal Assistance - Patient > 75% ?  ?  ?Locomotion ?Ambulation ? ? ?Ambulation assist ? ?   ? ?Assist level: Minimal Assistance - Patient > 75% ?Assistive device: Hand held assist ?Max distance: 150  ? ?Walk 10  feet activity ? ? ?Assist ?   ? ?Assist level: Minimal Assistance - Patient > 75% ?Assistive device: Hand held assist  ? ?Walk 50 feet activity ? ? ?Assist   ? ?Assist level: Minimal Assistance - Patient > 75% ?Assistive device: Hand held assist  ? ? ?Walk 150 feet activity ? ? ?Assist   ? ?Assist level: Minimal Assistance - Patient > 75% ?Assistive device: Hand held assist ?  ? ?Walk 10 feet on uneven surface  ?activity ? ? ?Assist   ? ? ?Assist level: Minimal Assistance - Patient > 75% ?Assistive device: Hand held assist  ? ?Wheelchair ? ? ? ? ?Assist Is the patient using a wheelchair?: No ?  ?  ? ?  ?   ? ? ?Wheelchair 50 feet with 2 turns activity ? ? ? ?Assist ? ?  ?  ? ? ?   ? ?Wheelchair 150 feet activity   ? ? ? ?Assist ?   ? ? ?   ? ?Blood pressure (!) 124/53, pulse 69, temperature 97.8 ?F (36.6 ?C), temperature source Oral, resp. rate 14, height '5\' 10"'$  (1.778 m), weight 71.2 kg, SpO2 94 %. ? ?Medical Problem List and Plan: ?1. Functional deficits secondary to left MCA scattered small infarcts, possible aspiration pneumonia, in setting of TBI 2 years ago ?            -patient may shower ?            -ELOS/Goals: 5-7 days ?            -continue Keppra ?           -Continue CIR therapies including PT, OT, and SLP  ? -discussed "cryptogenic" etiology of stroke, presumed cardioembolic event- loop recorder in place ?2.  Antithrombotics: ?-DVT/anticoagulation:  Mechanical:  Antiembolism stockings, knee (TED hose) Bilateral lower extremities ?            -antiplatelet therapy: Plavix (through 511), aspirin for 3 weeks then aspirin only ?3. Pain in toes/feet: not present currently. Ordered Tylenol as needed ?4. Depression: LCSW to evaluate and provide emotional support ?            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 -neuropsych f/u requested ?            -antipsychotic agents: n/a ?5. Neuropsych: This patient is not fully capable of making decisions on his own behalf. ?6. Skin/Wound Care: Routine skin care checks ?7. 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             ?9: Hypertension: continue Coreg ?10: Possible aspiration pneumonia: continue Unasyn>completed 5 days ?            -chest clear, afebrile ? -4/24 monitor clinically ?11: Dyslipidemia: continue Crestor ?12: Reflux: continue Protonix ?13: ADD: continue Adderall ?14: BPH: Continue Proscar ?15: Cardiomyopathy: looped recorder placed 4/21 ?            --follow-up with Dr. 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 4/24 Cr 1.8 to 2.0 ? -wife concerned about fluid intake ? -he's actually +650cc for admit ?17. Insomnia ? 4/24  continue trazodone '100mg'$  qhs ? -avoid daytime naps ? ? ?LOS: ?3 days ?A FACE TO FACE EVALUATION WAS PERFORMED ? ?Meredith Staggers ?04/29/2021, 10:25 AM  ? ? ? ?

## 2021-04-29 NOTE — Discharge Instructions (Addendum)
Inpatient Rehab Discharge Instructions ? ?Gary [name] ?Discharge date and time: 05/05/2021 ? ?Activities/Precautions/ Functional Status: ?Activity: no lifting, driving, or strenuous exercise for until cleared by MD ?Diet: regular diet ?Wound Care: none needed ?Functional status:  ?___ No restrictions     ___ Walk up steps independently ?___ 24/7 supervision/assistance   ___ Walk up steps with assistance ?__x_ Intermittent supervision/assistance  ___ Bathe/dress independently ?___ Walk with walker     ___ Bathe/dress with assistance ?___ Walk Independently    ___ Shower independently ?___ Walk with assistance    __x_ Shower with assistance ?__x_ No alcohol     ___ Return to work/school ________ ? ? ?COMMUNITY REFERRALS UPON DISCHARGE:   ? ?Outpatient: PT     OT    ST  ?            Agency:[facility]     Phone:[phone]  ?            Appointment Date/Time:*Please expect follow-up within 7-10 business days to schedule your appointment. If you have not received follow-up, be sure to contact site directly.* ? ? ? ?Special Instructions: ? ?No driving, alcohol consumption or tobacco use.  ? ?STROKE/TIA DISCHARGE INSTRUCTIONS ?SMOKING Cigarette smoking nearly doubles your risk of having a stroke & is the single most alterable risk factor  ?If you smoke or have smoked in the last 12 months, you are advised to quit smoking for your health. Most of the excess cardiovascular risk related to smoking disappears within a year of stopping. ?Ask you doctor about anti-smoking medications ?[place] Quit Line: 1-800-QUIT NOW ?Free Smoking Cessation Classes [phone]  ?CHOLESTEROL Know your levels; limit fat & cholesterol in your diet  ?Lipid Panel  ?   ?Component Value Date/Time  ? CHOL 189 04/24/2021 0414  ? TRIG 199 (H) 04/24/2021 0414  ? HDL 30 (L) 04/24/2021 0414  ? CHOLHDL 6.3 04/24/2021 0414  ? VLDL 40 04/24/2021 0414  ? [place] 119 (H) 04/24/2021 0414  ? ? ? Many patients benefit from treatment even if their  cholesterol is at goal. ?Goal: Total Cholesterol (CHOL) less than 160 ?Goal:  Triglycerides (TRIG) less than 150 ?Goal:  HDL greater than 40 ?Goal:  LDL (LDLCALC) less than 100 ?  ?BLOOD PRESSURE American Stroke Association blood pressure target is less that 120/80 mm/Hg  ?Your discharge blood pressure is:  BP: (!) 124/53 Monitor your blood pressure ?Limit your salt and alcohol intake ?Many individuals will require more than one medication for high blood pressure  ?DIABETES (A1c is a blood sugar average for last 3 months) Goal HGBA1c is under 7% (HBGA1c is blood sugar average for last 3 months)  ?Diabetes: ?No known diagnosis of diabetes   ? ?Lab Results  ?Component Value Date  ? HGBA1C 5.3 04/23/2021  ? ? Your HGBA1c can be lowered with medications, healthy diet, and exercise. ?Check your blood sugar as directed by your physician ?Call your physician if you experience unexplained or low blood sugars.  ?PHYSICAL ACTIVITY/REHABILITATION Goal is 30 minutes at least 4 days per week  ?Activity: Increase activity slowly, ?Therapies: Physical Therapy: Outpatient ?Return to work: n/a Activity decreases your risk of heart attack and stroke and makes your heart stronger.  It helps control your weight and blood pressure; helps you relax and can improve your mood. ?Participate in a regular exercise program. ?Talk with your doctor about the best form of exercise for you (dancing, walking, swimming, cycling).  ?DIET/WEIGHT Goal is to maintain a healthy  weight  ?Your discharge diet is:  ?Diet Order   ? ?       ?  Diet regular Room service appropriate? Yes with Assist; Fluid consistency: Thin  Diet effective now       ?  ? ?  ?  ? ?  ? thin liquids ?Your height is:  Height: '5\' 10"'$  (177.8 cm) ?Your current weight is: Weight: 71.2 kg ?Your Body Mass Index (BMI) is:  BMI (Calculated): 22.52 Following the type of diet specifically designed for you will help prevent another stroke. ?Your goal weight range is:   ?Your goal Body Mass  Index (BMI) is 19-24. ?Healthy food habits can help reduce 3 risk factors for stroke:  High cholesterol, hypertension, and excess weight.  ?RESOURCES Stroke/Support Group:  Call [phone] ?  ?STROKE EDUCATION PROVIDED/REVIEWED AND GIVEN TO PATIENT Stroke warning signs and symptoms ?How to activate emergency medical system (call 911). ?Medications prescribed at discharge. ?Need for follow-up after discharge. ?Personal risk factors for stroke. ?Pneumonia vaccine given: No ?Flu vaccine given: No ?My questions have been answered, the writing is legible, and I understand these instructions.  I will adhere to these goals & educational materials that have been provided to me after my discharge from the hospital.  ? ?  ?My questions have been answered and I understand these instructions. I will adhere to these goals and the provided educational materials after my discharge from the hospital. ? ?Patient/Caregiver Signature _______________________________ Date __________ ? ?Clinician Signature _______________________________________ Date __________ ? ?Please bring this form and your medication list with you to all your follow-up doctor's appointments.   ?
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
## 2021-04-30 MED ORDER — AMPHETAMINE-DEXTROAMPHET ER 10 MG PO CP24
10.0000 mg | ORAL_CAPSULE | Freq: Once | ORAL | Status: AC
  Administered 2021-04-30: 10 mg via ORAL
  Filled 2021-04-30: qty 1

## 2021-04-30 MED ORDER — TRAZODONE HCL 50 MG PO TABS
50.0000 mg | ORAL_TABLET | Freq: Every day | ORAL | Status: DC
  Administered 2021-04-30 – 2021-05-04 (×5): 50 mg via ORAL
  Filled 2021-04-30 (×6): qty 1

## 2021-04-30 MED ORDER — AMPHETAMINE-DEXTROAMPHET ER 10 MG PO CP24
30.0000 mg | ORAL_CAPSULE | Freq: Every day | ORAL | Status: DC
Start: 2021-05-01 — End: 2021-05-05
  Administered 2021-05-01 – 2021-05-05 (×5): 30 mg via ORAL
  Filled 2021-04-30 (×5): qty 3

## 2021-04-30 NOTE — Progress Notes (Signed)
Patient ID: Gary [name], male   DOB: 1945-06-08, 76 y.o.   MRN: [id number] ? ?SW met with pt in room to provide updates from team conference, and d/c date 4/30. ? ?67- SW spoke with pt wife Candy to inform on above. She is familiar with d/c process. Pharmacy is [facility]. She  ?She asked questions about medication and how Adderrall is to be covered by WC. SW encouraged her to discuss with pharmacy about how this medication is billed. She also would like physician to call to review d/c instructions and medications. She does not feel family education is needed since she understands his care needs. SW to f/u with d/c recommendations. ? ?Loralee Pacas, MSW, LCSWA ?Office: [phone] ?Cell: [phone] ?Fax: [phone]  ?

## 2021-04-30 NOTE — Plan of Care (Signed)
?  Problem: Consults ?Goal: RH STROKE PATIENT EDUCATION ?Description: See Patient Education module for education specifics  ?Outcome: Progressing ?  ?Problem: RH SAFETY ?Goal: RH STG ADHERE TO SAFETY PRECAUTIONS W/ASSISTANCE/DEVICE ?Description: STG Adhere to Safety Precautions With Cues and Reminders. ?Outcome: Progressing ?Goal: RH STG DECREASED RISK OF FALL WITH ASSISTANCE ?Description: STG Decreased Risk of Fall With Supervision Assistance. ?Outcome: Progressing ?  ?Problem: RH COGNITION-NURSING ?Goal: RH STG USES MEMORY AIDS/STRATEGIES W/ASSIST TO PROBLEM SOLVE ?Description: STG Uses Memory Aids/Strategies With Supervision Assistance to Problem Solve. ?Outcome: Progressing ?Goal: RH STG ANTICIPATES NEEDS/CALLS FOR ASSIST W/ASSIST/CUES ?Description: STG Anticipates Needs/Calls for Assist With Cues and Reminders. ?Outcome: Progressing ?  ?Problem: RH KNOWLEDGE DEFICIT ?Goal: RH STG INCREASE KNOWLEDGE OF HYPERTENSION ?Description: Patient will demonstrate knowledge of HTN medications, dietary recommendations, and blood pressure management with educational materials and handouts provided by staff independently at discharge. ?Outcome: Progressing ?Goal: RH STG INCREASE KNOWLEDGE OF STROKE PROPHYLAXIS ?Description: Patient will demonstrate knowledge of medications used to prevent future strokes with educational materials and handouts provided by staff independently at discharge. ?Outcome: Progressing ?  ?

## 2021-04-30 NOTE — Progress Notes (Signed)
Occupational Therapy Session Note ? ?Patient Details  ?Name: Gary [name] ?MRN: [id number] ?Date of Birth: 09/29/1945 ? ?Today's Date: 04/30/2021 ?OT Individual Time: 2482-5003 ?OT Individual Time Calculation (min): 45 min  ? ? ?Short Term Goals: ?Week 1:  OT Short Term Goal 1 (Week 1): STG=LTG d/t ELOS ? ?Skilled Therapeutic Interventions/Progress Updates:  ?  Pt received in recliner with no pain reported  ?Therapeutic activity ?Seated and ambulatory visual scanning tasks compelted in hallway and tx gym. Pt with 1 error with dual task standing and ambulating in hallway. Seated pt reporting double vision resolves with lower visual fields. Pt seeing double with convergence using brocks string ? ?Pt completes functional mobility in hallway with CGA and small pertubations with no LOB going up ramp, over mulch and down curb step. Mild depth perception difficulties stepping down curb step and reaching for items.  ? ?4x4 sit to stand laundry basket deadlifts with 6, 13, 23, and 33 # per set added into laundry basket with dumbbells ? ?Pt left at end of session in recliner with exit alarm on, call light in reach and all needs met ? ? ?Therapy Documentation ?Precautions:  ?Precautions ?Precautions: Fall ?Restrictions ?Weight Bearing Restrictions: No ?General: ?  ? ?Therapy/Group: Individual Therapy ? ?Gary [name] ?04/30/2021, 6:48 AM ?

## 2021-04-30 NOTE — Progress Notes (Signed)
Occupational Therapy Session Note ? ?Patient Details  ?Name: Gary [name] ?MRN: [id number] ?Date of Birth: 1945/09/30 ? ?Today's Date: 04/30/2021 ?OT Individual Time: 7505-1833 ?OT Individual Time Calculation (min): 45 min  ? ? ?Short Term Goals: ?Week 1:  OT Short Term Goal 1 (Week 1): STG=LTG d/t ELOS ? ?Skilled Therapeutic Interventions/Progress Updates:  ?  Pt received supine with no c/o pain, agreeable to OT session. Wife present and with questions re upcoming trip to [place]. Provided edu on DVT prevention with frequent walking breaks. Also had OT partner convene with MD to get his opinion to share with pt/wife. Pt completed 50 ft of functional mobility to the therapy gym with CGA. Session focused on visual perception skills- including tracking, trialing occlusion for diplopia, and convergence. Deficits in both R/L peripheral fields. L eye with better acuity than R eye with reading tasks at 18 in away. He completed several activities on the BITS to challenge aforementioned skills, as well as the bell cancellation test with cueing required for use of an anchor as a visual cue. 150 ft of functional mobility with CGA- (S) with trial of occluded glasses on/off. Pt actually reports balance worse with glasses on as he has likely adapted to deficits. He returned to his room and was left supine with all needs met, bed alarm set.  ? ?Therapy Documentation ?Precautions:  ?Precautions ?Precautions: Fall ?Restrictions ?Weight Bearing Restrictions: No ? ?Therapy/Group: Individual Therapy ? ?Curtis Sites ?04/30/2021, 6:41 AM ?
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
## 2021-04-30 NOTE — Progress Notes (Signed)
Speech Language Pathology Daily Session Note ? ?Patient Details  ?Name: Gary [name] ?MRN: [id number] ?Date of Birth: July 19, 1945 ? ?Today's Date: 04/30/2021 ? ?Session 1: SLP Individual Time: 1100-1130 ?SLP Individual Time Calculation (min): 30 min ? ?Session 2: SLP Individual Time: 3790-2409 ?SLP Individual Time Calculation (min): 25 min ? ?Short Term Goals: ?Week 1: SLP Short Term Goal 1 (Week 1): STGs=LTGs due to ELOS ? ?Skilled Therapeutic Interventions: ? ?Session 1: Skilled treatment session focused on cognitive goals. Upon arrival, patient was lethargic while upright in the bed. [name] present requesting changes in medications due to fatigue, physician aware. Patient ambulated to the sink and performed simple grooming tasks with supervision. SLP provided supervision level verbal cues for use of memory compensatory strategies to maximize recall of information that patient will need to locate again in the 2nd session. Patient also required supervision level verbal cues for problem solving regarding where to place visual aid to maximize recall and utilization. Patient left upright in recliner with alarm on and all needs within reach. Continue with current plan of care.  ? ?Session 2: Skilled treatment session focused on cognitive goals. Patient independently recalled where he replaced his list of locations to find. Patient ambulated throughout the unit to navigate to specific locations with overall Min verbal cues. Patient was unsteady on his feet throughout task, suspect due to attempting to donn/doff glasses and read his list while ambulating. SLP provided education regarding minimizing dual tasking while ambulating at this time. Patient verbalized understanding. Patient left upright in bed with alarm on and all needs within reach. Continue with current plan of care.  ?   ? ?Pain ? ?Session 1: No/Denies Pain  ?Session 2: No/Denies Pain  ? ?Therapy/Group: Individual Therapy ? ?Moriyah Byington,  Beverley Sherrard ?04/30/2021, 12:02 PM ?

## 2021-04-30 NOTE — Progress Notes (Signed)
Physical Therapy Session Note ? ?Patient Details  ?Name: Gary [name] ?MRN: [id number] ?Date of Birth: 10-04-1945 ? ?Today's Date: 04/30/2021 ?PT Individual Time: 0800-0900 ?PT Individual Time Calculation (min): 60 min  ? ?Short Term Goals: ?Week 1:  PT Short Term Goal 1 (Week 1): STGs = LTGs ? ?Skilled Therapeutic Interventions/Progress Updates:  ?   ?Patient in bed with LPN in room providing morning medications upon PT arrival. Patient alert and agreeable to PT session. Patient denied pain during session. Discussed time it takes to perform tasks such as taking meds and eating. Patient reports this as "laziness" educated on deficits in motivation and initiation secondary to cognitive deficits, discussed with MD during rounding.  ? ?Therapeutic Activity: ?Bed Mobility: Patient performed supine to/from sit with mod I with increased time and HOB elevated. ?Transfers: Patient performed sit to/from stand x6 with CGA progressing to supervision, noted increased posterior bias this morning. Provided verbal cues for forward weight shift and shifting weight into his toes in standing for reduced posterior bias. ? ?Gait Training:  ?Patient ambulated >150 feet x2 without an AD with CGA progressing to close supervision. Ambulated with intermittent scissoring and variable foot placement, slow R veering, and mild forward trunk flexion and downward head gaze. Provided verbal cues for looking ahead and visual scanning, increased gait speed and arm swing for balance, and attention to step width and symmetry. ?Performed dynamic gait training with CGA without AD:  ?Fast walking forwards and backawards ?Side-stepping R/L  ?Grape vine, alternating crossing front and back x2 with increased challege for coordination and balance components of task ? ?Neuromuscular Re-ed: ?Patient performed the following visual retraining activities: ?VOR x1 x2 min, slow and deliberate, patient did not see benefit of exercise despite education ?Attempted  use of Broc String with patient unable to attain single vision of 1 ball at any distance. ? ?Patient in bed, declined sitting up due to fatigue, at end of session with breaks locked, bed alarm set, and all needs within reach.  ? ?Therapy Documentation ?Precautions:  ?Precautions ?Precautions: Fall ?Restrictions ?Weight Bearing Restrictions: No ?General: ?PT Amount of Missed Time (min): 10 Minutes ?Vital Signs: ?Therapy Vitals ?Pulse Rate: 68 ?BP: 126/61 ?Pain: ?Pain Assessment ?Pain Scale: 0-10 ?Pain Score: 0-No pain ?Mobility: ?  ?Locomotion : ?   ?Trunk/Postural Assessment : ?   ?Balance: ?  ?Exercises: ?  ?Other Treatments:   ? ? ? ?Therapy/Group: Individual Therapy ? ?Doreene Burke PT, DPT ? ?04/30/2021, 10:36 AM  ?

## 2021-04-30 NOTE — Progress Notes (Signed)
?                                                       PROGRESS NOTE ? ? ?Subjective/Complaints: ?Slept fairly well. Doesn't like all the pills in the morning. Still slow to initiate at times especially in afternoon.  ? ?ROS: Patient denies fever, rash, sore throat, blurred vision,  nausea, vomiting, diarrhea, cough, shortness of breath or chest pain, joint or back/neck pain, headache, or mood change.  ? ?Objective: ?  ?No results found. ? ?No results for input(s): WBC, HGB, HCT, PLT in the last 72 hours. ? ?No results for input(s): NA, K, CL, CO2, GLUCOSE, BUN, CREATININE, CALCIUM in the last 72 hours. ? ? ?Intake/Output Summary (Last 24 hours) at 04/30/2021 0911 ?Last data filed at 04/29/2021 1830 ?Gross per 24 hour  ?Intake 600 ml  ?Output --  ?Net 600 ml  ?  ? ?  ? ?Physical Exam: ?Vital Signs ?Blood pressure 126/61, pulse 68, temperature 97.8 ?F (36.6 ?C), temperature source Oral, resp. rate 16, height '5\' 10"'$  (1.778 m), weight 71.2 kg, SpO2 96 %. ? ? ? ? ?Constitutional: No distress . Vital signs reviewed. ?HEENT: NCAT, EOMI, oral membranes moist ?Neck: supple ?Cardiovascular: RRR without murmur. No JVD    ?Respiratory/Chest: CTA Bilaterally without wheezes or rales. Normal effort    ?GI/Abdomen: BS +, non-tender, non-distended ?Ext: no clubbing, cyanosis, or edema ?Psych: pleasant and cooperative  ?Skin: ?   General: Skin is warm and dry.  ?   Comments: Loop site ok, burn on right arm ?Neurological:  ?   Mental Status: He is alert and oriented to person, place, and time. Language near normal, still some delays in processing. mild dysarthria, sensation intact in bilateral feet. 4-5/5 strength throughout. ? ?Assessment/Plan: ?1. Functional deficits which require 3+ hours per day of interdisciplinary therapy in a comprehensive inpatient rehab setting. ?Physiatrist is providing close team supervision and 24 hour management of active medical problems listed below. ?Physiatrist and rehab team continue to assess  barriers to discharge/monitor patient progress toward functional and medical goals ? ?Care Tool: ? ?Bathing ?   ?Body parts bathed by patient: Right arm, Left arm, Chest, Abdomen, Front perineal area, Buttocks, Right upper leg, Left upper leg, Right lower leg, Left lower leg, Face  ?   ?  ?  ?Bathing assist Assist Level: Minimal Assistance - Patient > 75% ?  ?  ?Upper Body Dressing/Undressing ?Upper body dressing   ?What is the patient wearing?: Pull over shirt ?   ?Upper body assist Assist Level: Minimal Assistance - Patient > 75% ?   ?Lower Body Dressing/Undressing ?Lower body dressing ? ? ?   ?What is the patient wearing?: Pants, Underwear/pull up ? ?  ? ?Lower body assist Assist for lower body dressing: Minimal Assistance - Patient > 75% ?   ? ?Toileting ?Toileting    ?Toileting assist Assist for toileting: Contact Guard/Touching assist ?  ?  ?Transfers ?Chair/bed transfer ? ?Transfers assist ?   ? ?Chair/bed transfer assist level: Minimal Assistance - Patient > 75% ?  ?  ?Locomotion ?Ambulation ? ? ?Ambulation assist ? ?   ? ?Assist level: Minimal Assistance - Patient > 75% ?Assistive device: Hand held assist ?Max distance: 150  ? ?Walk 10 feet activity ? ? ?Assist ?   ? ?Assist  level: Minimal Assistance - Patient > 75% ?Assistive device: Hand held assist  ? ?Walk 50 feet activity ? ? ?Assist   ? ?Assist level: Minimal Assistance - Patient > 75% ?Assistive device: Hand held assist  ? ? ?Walk 150 feet activity ? ? ?Assist   ? ?Assist level: Minimal Assistance - Patient > 75% ?Assistive device: Hand held assist ?  ? ?Walk 10 feet on uneven surface  ?activity ? ? ?Assist   ? ? ?Assist level: Minimal Assistance - Patient > 75% ?Assistive device: Hand held assist  ? ?Wheelchair ? ? ? ? ?Assist Is the patient using a wheelchair?: No ?  ?  ? ?  ?   ? ? ?Wheelchair 50 feet with 2 turns activity ? ? ? ?Assist ? ?  ?  ? ? ?   ? ?Wheelchair 150 feet activity  ? ? ? ?Assist ?   ? ? ?   ? ?Blood pressure 126/61, pulse 68,  temperature 97.8 ?F (36.6 ?C), temperature source Oral, resp. rate 16, height '5\' 10"'$  (1.778 m), weight 71.2 kg, SpO2 96 %. ? ?Medical Problem List and Plan: ?1. Functional deficits secondary to left MCA scattered small infarcts, possible aspiration pneumonia, in setting of TBI 2 years ago ?            -patient may shower ?            -ELOS/Goals: 5-7 days ?            -continue Keppra ?          -Continue CIR therapies including PT, OT, and SLP. Interdisciplinary team conference today to discuss goals, barriers to discharge, and dc planning.   ?2.  Antithrombotics: ?-DVT/anticoagulation:  Mechanical:  Antiembolism stockings, knee (TED hose) Bilateral lower extremities ?            -antiplatelet therapy: Plavix (through 511), aspirin for 3 weeks then aspirin only ?3. Pain in toes/feet: not present currently. Ordered Tylenol as needed ?4. Depression: LCSW to evaluate and provide emotional support ?            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 -increase adderall for initiation, energy ? -neuropsych f/u requested ?            -antipsychotic agents: n/a ?5. Neuropsych: This patient is not fully capable of making decisions on his own behalf. ?6. Skin/Wound Care: Routine skin care checks ?7. 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             ?9: Hypertension: continue Coreg ?10: Possible aspiration pneumonia: continue Unasyn>completed 5 days ?            -chest clear, afebrile ? -4/24 monitor clinically ? -dc IV ?11: Dyslipidemia: continue Crestor ?12: Reflux: continue Protonix ?13: ADD: continue Adderall ?14: BPH: Continue Proscar ?15: Cardiomyopathy: looped recorder placed 4/21 ?            --follow-up with Dr. 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 4/24 Cr 1.8 to 2.0 ? -wife concerned about fluid intake ? -he's actually +650cc for admit ? -f/u labs later this week ?17.  Insomnia ? 4/24 continue trazodone '100mg'$  qhs ? -avoid daytime naps ? -we have pursued fatigue work up as outpt ? ?LOS: ?4 days ?A FACE TO FACE EVALUATION WAS PERFORMED ? ?Meredith Staggers ?04/30/2021, 9:11 AM  ? ? ? ?

## 2021-05-01 NOTE — Progress Notes (Signed)
Occupational Therapy Session Note ? ?Patient Details  ?Name: Gary [name] ?MRN: [id number] ?Date of Birth: [birth year] ? ?Today's Date: 05/01/2021 ?OT Individual Time: 4142-3953 ?OT Individual Time Calculation (min): 54 min  ? ? ?Short Term Goals: ?Week 1:  OT Short Term Goal 1 (Week 1): STG=LTG d/t ELOS ? ?Skilled Therapeutic Interventions/Progress Updates:  ?   ?Pt received in bed with wife candy present and no pain  ? ?ADL: ?Pt completes ADL at overall supervision Level. Skilled interventions include: cuing for no furniture walking througout session, safety awareness, seated doffing/donning of LB clothing and sequencing bathing d/t perseverative tendency with washing. Pt stands at sink with hips rest on counter ~15 min to shave face prior to BADL at shower level. Alerted RN to change bandage on RUE.  ? ? ?Pt left at end of session in bed with exit alarm on, call light in reach and all needs met ? ? ?Therapy Documentation ?Precautions:  ?Precautions ?Precautions: Fall ?Restrictions ?Weight Bearing Restrictions: No ? ?Therapy/Group: Individual Therapy ? ?Lowella Dell Brandell Maready ?05/01/2021, 6:52 AM ?

## 2021-05-01 NOTE — Progress Notes (Signed)
Physical Therapy Session Note ? ?Patient Details  ?Name: Gary [name] ?MRN: [id number] ?Date of Birth: September 10, 1945 ? ?Today's Date: 05/01/2021 ?PT Individual Time: 1400-1500 ?PT Individual Time Calculation (min): 60 min  ? ?Short Term Goals: ?Week 1:  PT Short Term Goal 1 (Week 1): STGs = LTGs ? ?Skilled Therapeutic Interventions/Progress Updates:  ?   ?Patient in bed upon PT arrival. Patient alert and agreeable to PT session. Patient denied pain during session. ? ?Focused session on community integration activity, stroke education with discussion on life-style changes and promotion of increased activity levels. Patient endorsed interest in group exercise, specifically Tai Chi, and reports that he had worked up to doing 100 push-up a day (broken up into smaller intervals throughout the day) PTA. ? ?Financial trader Activity: ?Patient instructed to locate 3 places Wellsite geologist, cafeteria, gift shop) in the hospital then navigate back to his room. Patient self-selected to write the locations down as a memory strategy. Patient utilized signs on the wall to navigate to each location, required max progressing to mod cues for problem solving, recall, and orientation of location throughout. Needed total A to determine next steps from [place] as there were no signs in the area. Patient ambulated with supervision throughout with mild ataxic gait, decreased spatial awareness, decreased social engagement or behaviors (unaware to move to walk around or out of the way of other people without cues). Patient completed task in 25 min.  ? ?Patient ambulated to/form main therapy gym with supervision, as above. Performed floor transfer to assess ability to reinitiate push-up regiment at d/c.  Patient transferred to/from the floor with supervision, performed 20 push-ups on his knees while on the floor with good technique. Educated on performing floor transfer from sitting<>floor to reduce orthostasis, dizziness, or LOB, patient stated  understanding. ? ?Patient sitting up in bed, declined sitting in the recliner, at end of session with breaks locked, bed alarm set, and all needs within reach.  ? ?Therapy Documentation ?Precautions:  ?Precautions ?Precautions: Fall ?Restrictions ?Weight Bearing Restrictions: No ? ? ? ?Therapy/Group: Individual Therapy ? ?Doreene Burke PT, DPT ? ?05/01/2021, 4:34 PM  ?

## 2021-05-01 NOTE — Progress Notes (Addendum)
?                                                       PROGRESS NOTE ? ? ?Subjective/Complaints: ?Says he slept well with the reduced trazodone. Eating breakfast. Denies pain ? ?ROS: Patient denies fever, rash, sore throat, blurred vision, dizziness, nausea, vomiting, diarrhea, cough, shortness of breath or chest pain, joint or back/neck pain, or mood change.   ? ?Objective: ?  ?No results found. ? ?No results for input(s): WBC, HGB, HCT, PLT in the last 72 hours. ? ?No results for input(s): NA, K, CL, CO2, GLUCOSE, BUN, CREATININE, CALCIUM in the last 72 hours. ? ?No intake or output data in the 24 hours ending 05/01/21 0802 ?  ? ?  ? ?Physical Exam: ?Vital Signs ?Blood pressure 129/77, pulse 72, temperature 98.1 ?F (36.7 ?C), temperature source Oral, resp. rate 14, height '5\' 10"'$  (1.778 m), weight 71.2 kg, SpO2 95 %. ? ? ? ? ?Constitutional: No distress . Vital signs reviewed. ?HEENT: NCAT, EOMI, oral membranes moist ?Neck: supple ?Cardiovascular: RRR without murmur. No JVD    ?Respiratory/Chest: CTA Bilaterally without wheezes or rales. Normal effort    ?GI/Abdomen: BS +, non-tender, non-distended ?Ext: no clubbing, cyanosis, or edema ?Psych: pleasant and cooperative, slightly flat  ?Skin: ?   General: Skin is warm and dry.  ?   Comments: Loop site ok, burn on right arm ?Neurological:  ?   Mental Status: He is alert and oriented to person, place, and time.  Language near normal, delay in processing. mild dysarthria, sensation intact in bilateral lower extremities. 4-5/5 strength throughout. ? ?Assessment/Plan: ?1. Functional deficits which require 3+ hours per day of interdisciplinary therapy in a comprehensive inpatient rehab setting. ?Physiatrist is providing close team supervision and 24 hour management of active medical problems listed below. ?Physiatrist and rehab team continue to assess barriers to discharge/monitor patient progress toward functional and medical goals ? ?Care Tool: ? ?Bathing ?   ?Body  parts bathed by patient: Right arm, Left arm, Chest, Abdomen, Front perineal area, Buttocks, Right upper leg, Left upper leg, Right lower leg, Left lower leg, Face  ?   ?  ?  ?Bathing assist Assist Level: Minimal Assistance - Patient > 75% ?  ?  ?Upper Body Dressing/Undressing ?Upper body dressing   ?What is the patient wearing?: Pull over shirt ?   ?Upper body assist Assist Level: Minimal Assistance - Patient > 75% ?   ?Lower Body Dressing/Undressing ?Lower body dressing ? ? ?   ?What is the patient wearing?: Pants, Underwear/pull up ? ?  ? ?Lower body assist Assist for lower body dressing: Minimal Assistance - Patient > 75% ?   ? ?Toileting ?Toileting    ?Toileting assist Assist for toileting: Contact Guard/Touching assist ?  ?  ?Transfers ?Chair/bed transfer ? ?Transfers assist ?   ? ?Chair/bed transfer assist level: Minimal Assistance - Patient > 75% ?  ?  ?Locomotion ?Ambulation ? ? ?Ambulation assist ? ?   ? ?Assist level: Minimal Assistance - Patient > 75% ?Assistive device: Hand held assist ?Max distance: 150  ? ?Walk 10 feet activity ? ? ?Assist ?   ? ?Assist level: Minimal Assistance - Patient > 75% ?Assistive device: Hand held assist  ? ?Walk 50 feet activity ? ? ?Assist   ? ?Assist level:  Minimal Assistance - Patient > 75% ?Assistive device: Hand held assist  ? ? ?Walk 150 feet activity ? ? ?Assist   ? ?Assist level: Minimal Assistance - Patient > 75% ?Assistive device: Hand held assist ?  ? ?Walk 10 feet on uneven surface  ?activity ? ? ?Assist   ? ? ?Assist level: Minimal Assistance - Patient > 75% ?Assistive device: Hand held assist  ? ?Wheelchair ? ? ? ? ?Assist Is the patient using a wheelchair?: No ?  ?  ? ?  ?   ? ? ?Wheelchair 50 feet with 2 turns activity ? ? ? ?Assist ? ?  ?  ? ? ?   ? ?Wheelchair 150 feet activity  ? ? ? ?Assist ?   ? ? ?   ? ?Blood pressure 129/77, pulse 72, temperature 98.1 ?F (36.7 ?C), temperature source Oral, resp. rate 14, height '5\' 10"'$  (1.778 m), weight 71.2 kg, SpO2 95  %. ? ?Medical Problem List and Plan: ?1. Functional deficits secondary to left MCA scattered small infarcts, possible aspiration pneumonia, in setting of TBI 2 years ago ?            -patient may shower ?            -ELOS/Goals: 5-7 days ?            -continue Keppra ?          --Continue CIR therapies including PT, OT, and SLP  ?2.  Antithrombotics: ?-DVT/anticoagulation:  Mechanical:  Antiembolism stockings, knee (TED hose) Bilateral lower extremities ?            -antiplatelet therapy: Plavix (through 511), aspirin for 3 weeks then aspirin only ?3. Pain in toes/feet: not present currently. Ordered Tylenol as needed ?4. Depression: LCSW to evaluate and provide emotional support ?            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 -increased adderall for initiation, energy to '30mg'$  daily ? -neuropsych f/u requested ?            -antipsychotic agents: n/a ?5. Neuropsych: This patient is not fully capable of making decisions on his own behalf. ?6. Skin/Wound Care: Routine skin care checks ?7. 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             ?9: Hypertension: continue Coreg ?10: Possible aspiration pneumonia: continue Unasyn>completed 5 days ?            -chest clear, afebrile ? -4/24 monitor clinically ? -dc IV ?11: Dyslipidemia: continue Crestor ?12: Reflux: continue Protonix ?13: ADD: continue Adderall ?14: BPH: Continue Proscar ?15: Cardiomyopathy: looped recorder placed 4/21 ?            --follow-up with Dr. 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 4/24 Cr 1.8 to 2.0 ? -wife concerned about fluid intake ? -he's actually +650cc for admit ? -f/u labs tomorrow ?17. Insomnia ? 4/26   trazodone reduced to '50mg'$  qhs yesterday ? -seems to have slept well ? -has had fatigue as outpt however as well.  ? ?LOS: ?5 days ?A FACE TO FACE EVALUATION WAS PERFORMED ? ?Meredith Staggers ?05/01/2021, 8:02 AM  ? ? ? ?

## 2021-05-01 NOTE — Progress Notes (Signed)
Patient ID: Gary [name], male   DOB: 03-24-1945, 76 y.o.   MRN: [id number] ? ? ?SW spoke with pt wife Candy to inform on d/c recs of Outpatient PT/OT/SLP. Prefers [facility] ([address] location) since pt is established at this location.  ? ?Loralee Pacas, MSW, LCSWA ?Office: [phone] ?Cell: [phone] ?Fax: [phone]  ?

## 2021-05-02 LAB — BASIC METABOLIC PANEL
Anion gap: 8 (ref 5–15)
BUN: 24 mg/dL — ABNORMAL HIGH (ref 8–23)
CO2: 21 mmol/L — ABNORMAL LOW (ref 22–32)
Calcium: 9.1 mg/dL (ref 8.9–10.3)
Chloride: 111 mmol/L (ref 98–111)
Creatinine, Ser: 1.82 mg/dL — ABNORMAL HIGH (ref 0.61–1.24)
GFR, Estimated: 39 mL/min — ABNORMAL LOW (ref >60)
Glucose, Bld: 88 mg/dL (ref 70–99)
Potassium: 4.4 mmol/L (ref 3.5–5.1)
Sodium: 140 mmol/L (ref 135–145)

## 2021-05-02 LAB — CBC
HCT: 41 % (ref 39.0–52.0)
Hemoglobin: 15 g/dL (ref 13.0–17.0)
MCH: 33.9 pg (ref 26.0–34.0)
MCHC: 36.6 g/dL — ABNORMAL HIGH (ref 30.0–36.0)
MCV: 92.6 fL (ref 80.0–100.0)
Platelets: 579 10*3/uL — ABNORMAL HIGH (ref 150–400)
RBC: 4.43 MIL/uL (ref 4.22–5.81)
RDW: 15.4 % (ref 11.5–15.5)
WBC: 6.8 10*3/uL (ref 4.0–10.5)
nRBC: 0 % (ref 0.0–0.2)

## 2021-05-02 NOTE — Progress Notes (Signed)
Occupational Therapy Session Note ? ?Patient Details  ?Name: Gary [name] ?MRN: [id number] ?Date of Birth: 10-Dec-1945 ? ?Today's Date: 05/02/2021 ?OT Individual Time: 1300-1400 ?OT Individual Time Calculation (min): 60 min  ? ? ?Short Term Goals: ?Week 1:  OT Short Term Goal 1 (Week 1): STG=LTG d/t ELOS ? ?Skilled Therapeutic Interventions/Progress Updates:  ?   ?Pt received in recliner with no pain reported.  ? ?Therapeutic activity ?Pt completes pathfinding task replicating from PT session in morning with shoes and cane as he will use in the community. Pt with 5 instances of toes catching on floor but each time pt able to reactive step to cath balance with SPC and foot. No physical A. Pt requires A to recall 1/3 locations (elects no memory strategy) and requires MOD-max cuing for use of signs/noticing walking past elevator and info signs when walking. Pt completes obstacle course stepping over opbject with 5# ankle weight on RLE  ? ?Pt left at end of session in bed with exit alarm on, call light in reach and all needs met ? ? ?Therapy Documentation ?Precautions:  ?Precautions ?Precautions: Fall ?Restrictions ?Weight Bearing Restrictions: No ?General: ?  ? ?Therapy/Group: Individual Therapy ? ?Lowella Dell Azalia Neuberger ?05/02/2021, 6:56 AM ?

## 2021-05-02 NOTE — Progress Notes (Signed)
Recreational Therapy Session Note ? ?Patient Details  ?Name: Gary [name] ?MRN: [id number] ?Date of Birth: 1945/06/15 ?Today's Date: 05/02/2021 ?Pain:  no c/o ? ?Pt participated in stress managment/coping education group today per team referral.  Pt education/discussion focused on stress exploration including factors that contribute to stress, factors that protect against stress and potential coping strategies.  Coping strategies included deep breathing, progressive muscle relaxation, imagery & challenging irrational thoughts.  Pt actively participated in group discussion.  Handouts provided. ? ? ?Dinita Migliaccio ?05/02/2021, 4:07 PM  ?

## 2021-05-02 NOTE — Progress Notes (Signed)
Physical Therapy Discharge Summary ? ?Patient Details  ?Name: Gary [name] ?MRN: [id number] ?Date of Birth: 1945/02/11 ? ?Today's Date: 05/03/2021 ? ? ?Patient has met 9 of 9 long term goals due to improved activity tolerance, improved balance, improved postural control, increased strength, ability to compensate for deficits, improved attention, improved awareness, and improved coordination.  Patient to discharge at an ambulatory level Supervision.   Patient's care partner is independent to provide the necessary physical and cognitive assistance at discharge. ? ?Reasons goals not met: All PT goals met. ? ?Recommendation:  ?Patient will benefit from ongoing skilled PT services in outpatient setting to continue to advance safe functional mobility, address ongoing impairments in balance, activity tolerance, functional mobility, gait and stair training, community integration, dual task training, visual retraining, patient/caregiver education, and minimize fall risk. ? ?Equipment: ?No equipment provided ? ?Reasons for discharge: treatment goals met ? ?Patient/family agrees with progress made and goals achieved: Yes ? ?PT Discharge ?Precautions/Restrictions ?Precautions ?Precautions: Fall ?Restrictions ?Weight Bearing Restrictions: No ?Pain Interference ?Pain Interference ?Pain Effect on Sleep: 0. Does not apply - I have not had any pain or hurting in the past 5 days ?Pain Interference with Therapy Activities: 1. Rarely or not at all ?Pain Interference with Day-to-Day Activities: 1. Rarely or not at all ?Vision/Perception  ?Vision - History ?Ability to See in Adequate Light: 1 Impaired ?Vision - Assessment ?Eye Alignment: Within Functional Limits ?Ocular Range of Motion: Within Functional Limits ?Alignment/Gaze Preference: Within Defined Limits ?Tracking/Visual Pursuits: Decreased smoothness of horizontal tracking;Decreased smoothness of vertical tracking ?Saccades: Decreased speed of saccadic movement;Undershoots  (undershoots to the L and down) ?Convergence: Impaired (comment) (unable to bring target into focus at any distance due to double vision, unable to appropriately assess convergence or divergence) ?Diplopia Assessment: Disappears with one eye closed;Objects split side to side;Present all the time/all directions ?Additional Comments: diploplia since TBI in 2021, plans to follow-up with vision therapy and neuro opthemologist ?Perception ?Perception: Impaired ?Inattention/Neglect: Does not attend to right visual field (mild deficit, more noticable in busy environments) ?Praxis ?Praxis: Impaired ?Praxis Impairment Details: Perseveration;Initiation  ?Cognition ?Overall Cognitive Status: History of cognitive impairments - at baseline ?Arousal/Alertness: Awake/alert ?Orientation Level: Oriented to person;Oriented to place;Oriented to situation ?Memory: Impaired ?Memory Impairment: Decreased short term memory;Decreased recall of new information ?Awareness: Impaired ?Awareness Impairment: Anticipatory impairment ?Problem Solving: Impaired ?Problem Solving Impairment: Functional complex ?Behaviors: Perseveration ?Safety/Judgment: Impaired ?Sensation ?Sensation ?Light Touch: Impaired by gross assessment ?Additional Comments: impaired light touch B LEs ?Coordination ?Gross Motor Movements are Fluid and Coordinated: No ?Fine Motor Movements are Fluid and Coordinated: No ?Coordination and Movement Description: slow processing for cognition and motor execution ?Motor  ?Motor ?Motor: Abnormal postural alignment and control;Ataxia  ?Mobility ?Bed Mobility ?Rolling Right: Independent ?Rolling Left: Independent ?Supine to Sit: Independent ?Sit to Supine: Independent ?Transfers ?Transfers: Sit to Stand;Stand to [employer] Transfers ?Sit to Stand: Independent ?Stand to Sit: Independent ?Stand Pivot Transfers: Independent ?Transfer (Assistive device): None ?Locomotion  ?Gait ?Ambulation: Yes ?Gait Assistance: Supervision/Verbal  cueing ?Gait Distance (Feet): 1206 Feet (during 6MWT) ?Assistive device: None ?Gait Assistance Details: Verbal cues for precautions/safety;Verbal cues for gait pattern ?Gait Assistance Details: mild gait ataxia with distraction, poor spatial and social awareness in community settings ?Gait ?Gait: Yes ?Gait Pattern: Impaired ?Gait Pattern: Step-through pattern;Decreased stride length;Decreased hip/knee flexion - right;Decreased hip/knee flexion - left;Ataxic;Decreased trunk rotation;Trunk flexed;Narrow base of support ?Gait velocity: 1.02 m/s avg on 6MWT ?Stairs / Additional Locomotion ?Stairs: Yes ?Stairs Assistance: Supervision/Verbal cueing ?Stair Management Technique: No rails;Alternating pattern ?  Number of Stairs: 12 ?Height of Stairs: 6 ?Ramp: Supervision/Verbal cueing ?Curb: Supervision/Verbal cueing ?Wheelchair Mobility ?Wheelchair Mobility: No  ?Trunk/Postural Assessment  ?Cervical Assessment ?Cervical Assessment:  (forward head) ?Thoracic Assessment ?Thoracic Assessment:  (rounded shoulders) ?Lumbar Assessment ?Lumbar Assessment:  (post pelvic tilt) ?Postural Control ?Postural Control: Deficits on evaluation (delayed/insufficient R)  ?Balance ?Standardized Balance Assessment ?Standardized Balance Assessment: Merrilee Jansky Balance Test;Functional Gait Assessment ?Berg Balance Test ?Sit to Stand: Able to stand without using hands and stabilize independently ?Standing Unsupported: Able to stand safely 2 minutes ?Sitting with Back Unsupported but Feet Supported on Floor or Stool: Able to sit safely and securely 2 minutes ?Stand to Sit: Sits safely with minimal use of hands ?Transfers: Able to transfer safely, minor use of hands ?Standing Unsupported with Eyes Closed: Able to stand 10 seconds safely ?Standing Ubsupported with Feet Together: Able to place feet together independently and stand for 1 minute with supervision ?From Standing, Reach Forward with Outstretched Arm: Can reach forward >12 cm safely (5") ?From  Standing Position, Pick up Object from Floor: Able to pick up shoe safely and easily ?From Standing Position, Turn to Look Behind Over each Shoulder: Looks behind from both sides and weight shifts well ?Turn 360 Degrees: Able to turn 360 degrees safely but slowly ?Standing Unsupported, Alternately Place Feet on Step/Stool: Able to stand independently and complete 8 steps >20 seconds ?Standing Unsupported, One Foot in Front: Able to plae foot ahead of the other independently and hold 30 seconds ?Standing on One Leg: Able to lift leg independently and hold equal to or more than 3 seconds ?Total Score: 48 ?Static Sitting Balance ?Static Sitting - Level of Assistance: 7: Independent ?Static Standing Balance ?Static Standing - Level of Assistance: 7: Independent ?Dynamic Standing Balance ?Dynamic Standing - Level of Assistance: 5: Stand by assistance ?Functional Gait  Assessment ?Gait Level Surface: Walks 20 ft, slow speed, abnormal gait pattern, evidence for imbalance or deviates 10-15 in outside of the 12 in walkway width. Requires more than 7 sec to ambulate 20 ft. ?Change in Gait Speed: Able to change speed, demonstrates mild gait deviations, deviates 6-10 in outside of the 12 in walkway width, or no gait deviations, unable to achieve a major change in velocity, or uses a change in velocity, or uses an assistive device. ?Gait with Horizontal Head Turns: Performs head turns smoothly with slight change in gait velocity (eg, minor disruption to smooth gait path), deviates 6-10 in outside 12 in walkway width, or uses an assistive device. ?Gait with Vertical Head Turns: Performs task with slight change in gait velocity (eg, minor disruption to smooth gait path), deviates 6 - 10 in outside 12 in walkway width or uses assistive device ?Gait and Pivot Turn: Pivot turns safely in greater than 3 sec and stops with no loss of balance, or pivot turns safely within 3 sec and stops with mild imbalance, requires small steps to  catch balance. ?Step Over Obstacle: Is able to step over 2 stacked shoe boxes taped together (9 in total height) without changing gait speed. No evidence of imbalance. ?Gait with Narrow Base of Support: Ambulates less t

## 2021-05-02 NOTE — Progress Notes (Signed)
Occupational Therapy Session Note ? ?Patient Details  ?Name: Gary [name] ?MRN: [id number] ?Date of Birth: 10/04/1945 ? ?Today's Date: 05/02/2021 ?OT Group Time: 2836-6294 ?OT Group Time Calculation (min): 59 min ? ? ?Short Term Goals: ?Week 1:  OT Short Term Goal 1 (Week 1): STG=LTG d/t ELOS ? ?Skilled Therapeutic Interventions/Progress Updates:  ?Pt participated in group session with a focus on stress mgmt, education on healthy coping strategies, and social interaction. Focus of session on providing coping strategies to manage new diagnosis to allow for improved mental health to increase overall quality of life . Discussed how to break down stressors into ?daily hassles,? ?major life stressors? and ?life circumstances? in an effort to allow pts to chunk their stressors into groups and determine where to best put their efforts/time when dealing with stress. Pt actively sharing stressors and contributing to group conversation. Provided active listening, emotional support and therapeutic use of self. Offered education on factors that protect [place] against stress such as ?daily uplifts,? ?healthy coping strategies? and ?protective factors.? Encouraged all group members to make an effort to actively recall one event from their day that was a daily uplift in an effort to protect their mindset from stressors as well as sharing this information with their caregivers to facilitate improved caregiver communication and decrease overall burden of care.  Issued pt handouts on healthy coping strategies to implement into routine. Pt transported back to room by RT. ? ?Therapy Documentation ?Precautions:  ?Precautions ?Precautions: Fall ?Restrictions ?Weight Bearing Restrictions: No ? ?  ?Pain: no pain reported during session  ? ? ? ?Therapy/Group: Group Therapy ? ?Precious Haws ?05/02/2021, 4:17 PM ?

## 2021-05-02 NOTE — Progress Notes (Signed)
?                                                       PROGRESS NOTE ? ? ?Subjective/Complaints: ?Pt slept well again. Feels that he had a little more energy yesterday. No new complaints today ? ?ROS: Patient denies fever, rash, sore throat, blurred vision, dizziness, nausea, vomiting, diarrhea, cough, shortness of breath or chest pain, joint or back/neck pain, headache, or mood change.  ? ? ? ?Objective: ?  ?No results found. ? ?Recent Labs  ?  05/02/21 ?0817  ?WBC 6.8  ?HGB 15.0  ?HCT 41.0  ?PLT 579*  ? ? ?Recent Labs  ?  05/02/21 ?0630  ?NA 140  ?K 4.4  ?CL 111  ?CO2 21*  ?GLUCOSE 88  ?BUN 24*  ?CREATININE 1.82*  ?CALCIUM 9.1  ? ? ? ?Intake/Output Summary (Last 24 hours) at 05/02/2021 0923 ?Last data filed at 05/01/2021 2000 ?Gross per 24 hour  ?Intake 297 ml  ?Output --  ?Net 297 ml  ? ?  ? ?  ? ?Physical Exam: ?Vital Signs ?Blood pressure 128/81, pulse 77, temperature 97.9 ?F (36.6 ?C), resp. rate 14, height '5\' 10"'$  (1.778 m), weight 71.2 kg, SpO2 99 %. ? ? ? ? ?Constitutional: No distress . Vital signs reviewed. ?HEENT: NCAT, EOMI, oral membranes moist ?Neck: supple ?Cardiovascular: RRR without murmur. No JVD    ?Respiratory/Chest: CTA Bilaterally without wheezes or rales. Normal effort    ?GI/Abdomen: BS +, non-tender, non-distended ?Ext: no clubbing, cyanosis, or edema ?Psych: pleasant and cooperative, still a little flat  ?Skin: ?   General: Skin is warm and dry.  ?   Comments: Loop site ok, burn on right arm clean ?Neurological:  ?   Mental Status: He is alert and oriented to person, place, and time.  Language near normal, processing speed a bit better.  sensation intact in bilateral lower extremities. 5/5 strength throughout. ? ?Assessment/Plan: ?1. Functional deficits which require 3+ hours per day of interdisciplinary therapy in a comprehensive inpatient rehab setting. ?Physiatrist is providing close team supervision and 24 hour management of active medical problems listed below. ?Physiatrist and rehab  team continue to assess barriers to discharge/monitor patient progress toward functional and medical goals ? ?Care Tool: ? ?Bathing ?   ?Body parts bathed by patient: Right arm, Left arm, Chest, Abdomen, Front perineal area, Buttocks, Right upper leg, Left upper leg, Right lower leg, Left lower leg, Face  ?   ?  ?  ?Bathing assist Assist Level: Minimal Assistance - Patient > 75% ?  ?  ?Upper Body Dressing/Undressing ?Upper body dressing   ?What is the patient wearing?: Pull over shirt ?   ?Upper body assist Assist Level: Minimal Assistance - Patient > 75% ?   ?Lower Body Dressing/Undressing ?Lower body dressing ? ? ?   ?What is the patient wearing?: Pants, Underwear/pull up ? ?  ? ?Lower body assist Assist for lower body dressing: Minimal Assistance - Patient > 75% ?   ? ?Toileting ?Toileting    ?Toileting assist Assist for toileting: Contact Guard/Touching assist ?  ?  ?Transfers ?Chair/bed transfer ? ?Transfers assist ?   ? ?Chair/bed transfer assist level: Minimal Assistance - Patient > 75% ?  ?  ?Locomotion ?Ambulation ? ? ?Ambulation assist ? ?   ? ?Assist level: Minimal Assistance -  Patient > 75% ?Assistive device: Hand held assist ?Max distance: 150  ? ?Walk 10 feet activity ? ? ?Assist ?   ? ?Assist level: Minimal Assistance - Patient > 75% ?Assistive device: Hand held assist  ? ?Walk 50 feet activity ? ? ?Assist   ? ?Assist level: Minimal Assistance - Patient > 75% ?Assistive device: Hand held assist  ? ? ?Walk 150 feet activity ? ? ?Assist   ? ?Assist level: Minimal Assistance - Patient > 75% ?Assistive device: Hand held assist ?  ? ?Walk 10 feet on uneven surface  ?activity ? ? ?Assist   ? ? ?Assist level: Minimal Assistance - Patient > 75% ?Assistive device: Hand held assist  ? ?Wheelchair ? ? ? ? ?Assist Is the patient using a wheelchair?: No ?  ?  ? ?  ?   ? ? ?Wheelchair 50 feet with 2 turns activity ? ? ? ?Assist ? ?  ?  ? ? ?   ? ?Wheelchair 150 feet activity  ? ? ? ?Assist ?   ? ? ?   ? ?Blood  pressure 128/81, pulse 77, temperature 97.9 ?F (36.6 ?C), resp. rate 14, height '5\' 10"'$  (1.778 m), weight 71.2 kg, SpO2 99 %. ? ?Medical Problem List and Plan: ?1. Functional deficits secondary to left MCA scattered small infarcts, possible aspiration pneumonia, in setting of TBI 2 years ago ?            -patient may shower ?            -ELOS/Goals: 4/30 ?            -continue Keppra ?          -Continue CIR therapies including PT, OT, and SLP  ?2.  Antithrombotics: ?-DVT/anticoagulation:  Mechanical:  Antiembolism stockings, knee (TED hose) Bilateral lower extremities ?            -antiplatelet therapy: Plavix (through 511), aspirin for 3 weeks then aspirin only ?3. Pain in toes/feet: not present currently. Ordered Tylenol as needed ?4. Depression: LCSW to evaluate and provide emotional support ?            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 -increased adderall for initiation, energy to '30mg'$  daily ? -neuropsych f/u requested-Rodenbough will not be back before he leaves.  ?            -antipsychotic agents: n/a ?5. Neuropsych: This patient is not fully capable of making decisions on his own behalf. ?6. Skin/Wound Care: Routine skin care checks ?7. 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             ?9: Hypertension: continue Coreg ?10: Possible aspiration pneumonia: continue Unasyn>completed 5 days ?            -chest clear, afebrile ? - dc 'ed IV ?11: Dyslipidemia: continue Crestor ?12: Reflux: continue Protonix ?13: ADD: continue Adderall ?14: BPH: Continue Proscar ?15: Cardiomyopathy: looped recorder placed 4/21 ?            --follow-up with Dr. 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 4/27 BUN sl elevated to 24 today ? -he's +2L since admit ? -push fluids, recheck tomorrow ?17. Insomnia ? 4/26   trazodone reduced to '50mg'$  qhs yesterday ? -seems to have slept well ? -has  had fatigue as outpt however as well.  ? ?LOS: ?6 days ?A FACE TO FACE  EVALUATION WAS PERFORMED ? ?Meredith Staggers ?05/02/2021, 9:23 AM  ? ? ? ?

## 2021-05-02 NOTE — Progress Notes (Signed)
Physical Therapy Session Note ? ?Patient Details  ?Name: Gary [name] ?MRN: [id number] ?Date of Birth: 06/11/1945 ? ?Today's Date: 05/02/2021 ?PT Individual Time: 0900-1000 ?PT Individual Time Calculation (min): 60 min  ? ?Short Term Goals: ?Week 1:  PT Short Term Goal 1 (Week 1): STGs = LTGs ? ?Skilled Therapeutic Interventions/Progress Updates:  ?   ?Patient in bed upon PT arrival. Patient alert and agreeable to PT session. Patient denied pain during session. ? ?Focused session on reassessment of outcome measures performed previously during stay.  ? ?Therapeutic Activity: ?Bed Mobility: Patient performed supine to/from sit independently. ?Transfers: Patient performed sit to/from stand with supervision-independently throughout session from various surfaces. Without an AD. Provided verbal cues for looking back to locate the chair x1 for safety. ? ?Gait Training:  ?Patient ambulated >250 feet x2, and >100 feet x2 without an AD with supervision. Ambulated with scissoring gait initially, but improved with increased distance/repetition, intermittent variable foot placement, R veering (improved with distance/repetition), poor spatial and social awareness of other people in the halls, and reduced gait speed and step height. Provided verbal cues for increased gait speed, attention to veering and people in the hallway, and increased arm swing for balance. ?Patient ascended/descended 12x6" steps without AD or rails with supervision. Performed reciprocal gait pattern throughout. Provided cues for technique and sequencing.  ?6 Min Walk Test:  ?Instructed patient to ambulate as quickly and as safely as possible for 6 minutes using LRAD. Patient was allowed to take standing rest breaks without stopping the test, but if the patient required a sitting rest break the clock would be stopped and the test would be over.  ?Results: 1206 feet (368 meters, Avg speed 1.02 m/s) without an AD with supervision. Results indicate that the  patient has reduced endurance with ambulation compared to age matched norms.  ?Age Matched Norms: 70-79 yo M: 527 meters ?MDC: 58.21 meters (190.98 feet) or 50 meters ?(ANPTA Core Set of Outcome Measures for Adults with Neurologic Conditions, 2018) ? ?Neuromuscular Re-ed: ?Berg Balance Test ?Sit to Stand: Able to stand without using hands and stabilize independently ?Standing Unsupported: Able to stand safely 2 minutes ?Sitting with Back Unsupported but Feet Supported on Floor or Stool: Able to sit safely and securely 2 minutes ?Stand to Sit: Sits safely with minimal use of hands ?Transfers: Able to transfer safely, minor use of hands ?Standing Unsupported with Eyes Closed: Able to stand 10 seconds safely ?Standing Ubsupported with Feet Together: Able to place feet together independently and stand for 1 minute with supervision ?From Standing, Reach Forward with Outstretched Arm: Can reach forward >12 cm safely (5") ?From Standing Position, Pick up Object from Floor: Able to pick up shoe safely and easily ?From Standing Position, Turn to Look Behind Over each Shoulder: Looks behind from both sides and weight shifts well ?Turn 360 Degrees: Able to turn 360 degrees safely but slowly ?Standing Unsupported, Alternately Place Feet on Step/Stool: Able to stand independently and complete 8 steps >20 seconds ?Standing Unsupported, One Foot in Front: Able to plae foot ahead of the other independently and hold 30 seconds ?Standing on One Leg: Able to lift leg independently and hold equal to or more than 3 seconds ?Total Score: 48/56 (improved from 36/56 on 4/23) ?Patient demonstrated increased fall risk noted by score of 48/56 on the [facility] Scale.  ?<45/56 = fall risk, <42/56 = predictive of recurrent falls, <40/56 = 100% fall risk  ?>41 = independent, 21-40 = assistive device, 0-20 = wheelchair level  ?  MDC 6.9 (4 pts 45-56, 5 pts 35-44, 7 pts 25-34) ?(ANPTA Core Set of Outcome Measures for Adults with Neurologic  Conditions, 2018) ?Functional Gait  Assessment ?Gait Level Surface: Walks 20 ft, slow speed, abnormal gait pattern, evidence for imbalance or deviates 10-15 in outside of the 12 in walkway width. Requires more than 7 sec to ambulate 20 ft. ?Change in Gait Speed: Able to change speed, demonstrates mild gait deviations, deviates 6-10 in outside of the 12 in walkway width, or no gait deviations, unable to achieve a major change in velocity, or uses a change in velocity, or uses an assistive device. ?Gait with Horizontal Head Turns: Performs head turns smoothly with slight change in gait velocity (eg, minor disruption to smooth gait path), deviates 6-10 in outside 12 in walkway width, or uses an assistive device. ?Gait with Vertical Head Turns: Performs task with slight change in gait velocity (eg, minor disruption to smooth gait path), deviates 6 - 10 in outside 12 in walkway width or uses assistive device ?Gait and Pivot Turn: Pivot turns safely in greater than 3 sec and stops with no loss of balance, or pivot turns safely within 3 sec and stops with mild imbalance, requires small steps to catch balance. ?Step Over Obstacle: Is able to step over 2 stacked shoe boxes taped together (9 in total height) without changing gait speed. No evidence of imbalance. ?Gait with Narrow Base of Support: Ambulates less than 4 steps heel to toe or cannot perform without assistance. ?Gait with Eyes Closed: Walks 20 ft, uses assistive device, slower speed, mild gait deviations, deviates 6-10 in outside 12 in walkway width. Ambulates 20 ft in less than 9 sec but greater than 7 sec. ?Ambulating Backwards: Walks 20 ft, uses assistive device, slower speed, mild gait deviations, deviates 6-10 in outside 12 in walkway width. ?Steps: Alternating feet, must use rail. ?Total Score: 18/30 (improved from 14/30 on 4/23) ?Patient demonstrates increased fall risk as noted by score of 18/30 on  Functional Gait Assessment.   ?<22/30 = predictive of  falls, <20/30 = fall in 6 months, <18/30 = predictive of falls in PD ?MCID: 5 points stroke population, 4 points geriatric population ?(ANPTA Core Set of Outcome Measures for Adults with Neurologic Conditions, 2018) ?Five times Sit to Stand Test (FTSS) ?Method: ?Use a straight back chair with a solid seat that is 17-18? high. Ask participant to sit on the chair with arms folded across their chest.   ?Instructions: ??Stand up and sit down as quickly as possible 5 times, keeping your arms folded across your chest.?   ?Measurement: ?Stop timing when the participant touches the chair in sitting the 5th time. ?TIME: 20 sec ?Cut off scores indicative of increased fall risk: >12 sec CVA, >16 sec PD, >13 sec vestibular ?(ANPTA Core Set of Outcome Measures for Adults with Neurologic Conditions, 2018) ? ?Educated patient on results, interpretation, improvement in scores, and persistent deficits of all testing performed. Patient appreciative of education. Discussed Items to be added to HEP and Silver Sneakers program at [employer] for community level physical activity. Patient receptive to trying this program. Asked about use of treadmill, educated on trying the treadmill in [facility] first and following their recommendations before getting on a treadmill at home or at a gym.  ? ?Patient in recliner in the room at end of session with breaks locked, seat belt alarm set, and all needs within reach.  ? ?Therapy Documentation ?Precautions:  ?Precautions ?Precautions: Fall ?Restrictions ?Weight Bearing Restrictions:  No ? ? ? ?Therapy/Group: Individual Therapy ? ?Doreene Burke PT, DPT ? ?05/02/2021, 3:52 PM  ?

## 2021-05-02 NOTE — Progress Notes (Signed)
Speech Language Pathology Daily Session Note ? ?Patient Details  ?Name: Gary [name] ?MRN: [id number] ?Date of Birth: 1945-10-25 ? ?Today's Date: 05/02/2021 ?SLP Individual Time: 9381-8299 ?SLP Individual Time Calculation (min): 55 min ? ?Short Term Goals: ?Week 1: SLP Short Term Goal 1 (Week 1): STGs=LTGs due to ELOS ? ?Skilled Therapeutic Interventions: Skilled treatment session focused on cognitive goals. Upon arrival, patient was awake in recliner and agreeable to SLP session. Patient donned shoes and ambulated to the SLP office. Patient required Min-Mod verbal cues for spatial and social awareness while ambulating. Patient also with intermittent LOB requiring Min A. Patient performed a mildly complex appointment/calendar task with overall supervision level verbal cues needed for problem solving and recall of information. SLP also provided education regarding external memory strategies and how to incorporate strategies at home. Patient reported difficulty navigating his cell phone, therefore, next session will focus on external aids to maximize utilization. Patient left upright in recliner with alarm on and all needs within reach. Continue with current plan of care.  ?   ? ?Pain ?Pain Assessment ?Pain Scale: 0-10 ?Pain Score: 0-No pain ? ?Therapy/Group: Individual Therapy ? ?Jaselle Pryer ?05/02/2021, 12:46 PM ?

## 2021-05-02 NOTE — Progress Notes (Signed)
Patient ID: Gary [name], male   DOB: 05/18/1945, 76 y.o.   MRN: [id number] ? ?SW faxed outpatient PT/OT/SLP referral to [facility] ([phone]/f:[phone]). ? ?Loralee Pacas, MSW, LCSWA ?Office: [phone] ?Cell: [phone] ?Fax: [phone]  ?

## 2021-05-02 NOTE — Progress Notes (Signed)
Discussion with wife per her request regarding thrombocytosis. He has trended upward the last 6 days>>from 405 4/19 to 579 today. Noted: platelet count of 509 on 10/26/2020.  No signs or symptoms of infection.  Patient admits to less than optimal fluid intake and his wife has continued to cue him even prior to this admission. Reassurance that anti-platelet therapy is ongoing, but that these drugs affect platelet function, not count. ?

## 2021-05-03 LAB — BASIC METABOLIC PANEL
Anion gap: 7 (ref 5–15)
BUN: 23 mg/dL (ref 8–23)
CO2: 25 mmol/L (ref 22–32)
Calcium: 9.2 mg/dL (ref 8.9–10.3)
Chloride: 108 mmol/L (ref 98–111)
Creatinine, Ser: 2.09 mg/dL — ABNORMAL HIGH (ref 0.61–1.24)
GFR, Estimated: 33 mL/min — ABNORMAL LOW (ref >60)
Glucose, Bld: 86 mg/dL (ref 70–99)
Potassium: 3.8 mmol/L (ref 3.5–5.1)
Sodium: 140 mmol/L (ref 135–145)

## 2021-05-03 MED ORDER — AMPHETAMINE-DEXTROAMPHETAMINE 10 MG PO TABS: 10.0000 mg | ORAL_TABLET | Freq: Every day | ORAL | 0 refills | Status: DC

## 2021-05-03 MED ORDER — CARVEDILOL 3.125 MG PO TABS: 3.1250 mg | ORAL_TABLET | Freq: Two times a day (BID) | ORAL | 0 refills | Status: DC

## 2021-05-03 MED ORDER — ASPIRIN 81 MG PO TBEC: 81.0000 mg | DELAYED_RELEASE_TABLET | Freq: Every day | ORAL | 0 refills | Status: DC

## 2021-05-03 MED ORDER — CLOPIDOGREL BISULFATE 75 MG PO TABS: 75.0000 mg | ORAL_TABLET | Freq: Every day | ORAL | 0 refills | Status: DC

## 2021-05-03 MED ORDER — LEVETIRACETAM 500 MG PO TABS
500.0000 mg | ORAL_TABLET | Freq: Two times a day (BID) | ORAL | 0 refills | Status: DC
Start: 2021-05-03 — End: 2021-05-17

## 2021-05-03 MED ORDER — AMPHETAMINE-DEXTROAMPHET ER 20 MG PO CP24: 20.0000 mg | ORAL_CAPSULE | Freq: Every day | ORAL | 0 refills | Status: DC

## 2021-05-03 MED ORDER — PANTOPRAZOLE SODIUM 40 MG PO TBEC: 40.0000 mg | DELAYED_RELEASE_TABLET | Freq: Every day | ORAL | 0 refills | Status: DC

## 2021-05-03 MED ORDER — DICLOFENAC SODIUM 1 % EX GEL: 2.0000 g | Freq: Four times a day (QID) | CUTANEOUS | Status: DC | PRN

## 2021-05-03 MED ORDER — ACETAMINOPHEN 325 MG PO TABS: 325.0000 mg | ORAL_TABLET | ORAL | Status: AC | PRN

## 2021-05-03 MED ORDER — ROSUVASTATIN CALCIUM 20 MG PO TABS: 20.0000 mg | ORAL_TABLET | Freq: Every day | ORAL | 0 refills | Status: DC

## 2021-05-03 NOTE — Progress Notes (Signed)
Occupational Therapy Discharge Summary ? ?Patient Details  ?Name: Gary [name] ?MRN: [id number] ?Date of Birth: 1945-10-05 ? ?Today's Date: 05/04/2021 ?OT Individual Time: 1025-8527 ?OT Individual Time Calculation (min): 45 min  ? ? ?Pt received in bed with all ADL needs met per report completed with supervision and NO AD. Pt changes into socks and shoes with set up. Pt completes functional mobility tasks with SPC ankle weights to improve attentnion/proprioception in RLE d/t frequent toe trips on R (5#). Cone toe taps progressing to step ups, progressing to step ups with alternate leg tapping cone on next step for SL stance/balance and strengthening. Pt requires CGA throughout activity with no AD. Pt completes mobility in hallway with cuing to not get so close to things on the R-R inattention with 2.5# weight (2 trips- no A to catch his balance) and a lap with no weight and no trips. Pt completes serial subtraction from 100 by 3 with 5 errors and 1 trip but does not stop counting when walking. Exited session with pt seated in recliner, exit alarm on and call light in reach ? ? ? ?Patient has met 13 of 13 long term goals due to improved activity tolerance, improved balance, postural control, ability to compensate for deficits, functional use of  RIGHT upper and RIGHT lower extremity, improved attention, improved awareness, and improved coordination.  Patient to discharge at overall Supervision level.  Patient's care partner is independent to provide the necessary cognitive assistance at discharge.   ? ?Reasons goals not met: n/a ? ?Recommendation:  ?Patient will benefit from ongoing skilled OT services in outpatient setting to continue to advance functional skills in the area of BADL, iADL, and Reduce care partner burden. ? ?Equipment: ?No equipment provided ? ?Reasons for discharge: treatment goals met and discharge from hospital ? ?Patient/family agrees with progress made and goals achieved: Yes ? ?OT  Discharge ?Precautions/Restrictions  ?Precautions ?Precautions: Fall ?Restrictions ?Weight Bearing Restrictions: No ?General ?  ?Vital Signs ?Therapy Vitals ?Temp: 97.6 ?F (36.4 ?C) ?Temp Source: Oral ?Pulse Rate: 65 ?Resp: 15 ?BP: 121/71 ?Patient Position (if appropriate): Sitting ?Oxygen Therapy ?SpO2: 100 % ?O2 Device: Room Air ?Pain ?  ?ADL ?ADL ?Grooming: Independent ?Where Assessed-Grooming: Standing at sink ?Upper Body Bathing: Supervision/safety ?Where Assessed-Upper Body Bathing: Shower ?Lower Body Bathing: Supervision/safety ?Where Assessed-Lower Body Bathing: Shower ?Upper Body Dressing: Setup ?Where Assessed-Upper Body Dressing: Chair ?Lower Body Dressing: Supervision/safety ?Where Assessed-Lower Body Dressing: Chair ?Toileting: Supervision/safety ?Where Assessed-Toileting: Toilet ?Toilet Transfer: Close supervision ?Toilet Transfer Method: Ambulating ?Tub/Shower Transfer: Close supervison ?Tub/Shower Transfer Method: Ambulating ?Vision ?Baseline Vision/History: 1 Wears glasses ?Patient Visual Report: Diplopia;Blurring of vision ?Eye Alignment: Within Functional Limits ?Ocular Range of Motion: Within Functional Limits ?Alignment/Gaze Preference: Within Defined Limits ?Tracking/Visual Pursuits: Decreased smoothness of horizontal tracking;Decreased smoothness of vertical tracking ?Saccades: Decreased speed of saccadic movement;Undershoots ?Visual Fields: Right visual field deficit ?Diplopia Assessment: Disappears with one eye closed;Objects split side to side;Present all the time/all directions ?Perception  ?Perception: Impaired ?Inattention/Neglect: Does not attend to right visual field ?Praxis ?Praxis: Impaired ?Praxis Impairment Details: Perseveration;Initiation ?Cognition ?Cognition ?Overall Cognitive Status: History of cognitive impairments - at baseline ?Arousal/Alertness: Awake/alert ?Orientation Level: Person;Place;Situation ?Person: Oriented ?Situation: Oriented ?Memory: Appears intact ?Awareness:  Impaired ?Problem Solving: Impaired ?Sequencing: Impaired ?Safety/Judgment: Impaired ?Brief Interview for Mental Status (BIMS) ?Repetition of Three Words (First Attempt): 3 ?Temporal Orientation: Year: Correct ?Temporal Orientation: Month: Accurate within 5 days ?Temporal Orientation: Day: Correct ?Recall: "Sock": No, could not recall ?Recall: "Blue": Yes, no cue required ?Recall: "Bed": No,  could not recall ?BIMS Summary Score: 11 ?Sensation ?Sensation ?Light Touch: Impaired by gross assessment ?Additional Comments: impaired light touch B LEs ?Coordination ?Gross Motor Movements are Fluid and Coordinated: No ?Fine Motor Movements are Fluid and Coordinated: No ?Coordination and Movement Description: slow processing for cognition and motor execution ?Motor  ?Motor ?Motor: Abnormal postural alignment and control;Ataxia ?Mobility  ?Transfers ?Sit to Stand: Independent ?Stand to Sit: Independent  ?Trunk/Postural Assessment  ?Cervical Assessment ?Cervical Assessment:  (forward head) ?Thoracic Assessment ?Thoracic Assessment:  (rounded shoudlers) ?Lumbar Assessment ?Lumbar Assessment:  (posterior pelvic tilt) ?Postural Control ?Postural Control: Deficits on evaluation (delayed R>L)  ?Balance ?Balance ?Balance Assessed: Yes ?Static Sitting Balance ?Static Sitting - Level of Assistance: 7: Independent ?Static Standing Balance ?Static Standing - Level of Assistance: 7: Independent ?Dynamic Standing Balance ?Dynamic Standing - Level of Assistance: 5: Stand by assistance ?Extremity/Trunk Assessment ?RUE Assessment ?RUE Assessment: Exceptions to [facility] ?General Strength Comments: mild improvement in strength from eval ?RUE Body System: Neuro ?Brunstrum levels for arm and hand: Arm;Hand ?Brunstrum level for arm: Stage V Relative Independence from Synergy ?Brunstrum level for hand: Stage VI Isolated joint movements ?LUE Assessment ?LUE Assessment: Within Functional Limits ?General Strength Comments: 9HPT 32 seconds ? ? ?Gary [name]  Gary [name] ?05/03/2021, 3:36 PM ?

## 2021-05-03 NOTE — Progress Notes (Signed)
Occupational Therapy Session Note ? ?Patient Details  ?Name: Gary [name] ?MRN: [id number] ?Date of Birth: 22-Apr-1945 ? ?Today's Date: 05/03/2021 ?OT Individual Time: 4825-0037 ?OT Individual Time Calculation (min): 29 min  ? ? ?Short Term Goals: ?Week 1:  OT Short Term Goal 1 (Week 1): STG=LTG d/t ELOS ? ?Skilled Therapeutic Interventions/Progress Updates:  ?Pt greeted seated in recliner agreeable to OT intervention. Pt completed functional ambulation from room to gym with SPC and CGA. Pt completed therapeutic activity of completing obstacle course to challenge dynamic balance, graded task up and incorporated functional reaching into task with pt instructed to reach out BOS to retrieve wash cloths at knee level and below with pt completing task with overall CGA with SPC and no LOB.       ?Pt able to stand on airex cushion with no UE support to fold wash cloths with no LOB and CGA.  ?Issued pt 3 scrabble letters "W, E, N" with pt instructed to ambulate in hallway and place letters on either objects or words in hallway that had the corresponding letters. Pt completed task with CGA for functional ambulation with SPC and MOD verbal cues for problem solving as pt initially putting the "N" in the word recipe. Pt utilizing compensatory method of occluding L eye during task.  ?Pt ambulated back to room with CGA with SPC, pt completed toilet transfer with same level of assist, and supervision for 3/3 toileting tasks. Pt left seated in recliner with safety belt activated and all needs within reach.              ? ? ?Therapy Documentation ?Precautions:  ?Precautions ?Precautions: Fall ?Restrictions ?Weight Bearing Restrictions: No ? ?Pain: no pain  ? ? ? ?Therapy/Group: Individual Therapy ? ?Precious Haws ?05/03/2021, 4:06 PM ?

## 2021-05-03 NOTE — Progress Notes (Signed)
?                                                       PROGRESS NOTE ? ? ?Subjective/Complaints: ?Pt slept well again. Feels that he had a little more energy yesterday. No new complaints today ? ?ROS: Patient denies fever, rash, sore throat, blurred vision, dizziness, nausea, vomiting, diarrhea, cough, shortness of breath or chest pain, joint or back/neck pain, headache, or mood change.  ? ? ? ?Objective: ?  ?No results found. ? ?Recent Labs  ?  05/02/21 ?0817  ?WBC 6.8  ?HGB 15.0  ?HCT 41.0  ?PLT 579*  ? ? ?Recent Labs  ?  05/02/21 ?8719 05/03/21 ?0502  ?NA 140 140  ?K 4.4 3.8  ?CL 111 108  ?CO2 21* 25  ?GLUCOSE 88 86  ?BUN 24* 23  ?CREATININE 1.82* 2.09*  ?CALCIUM 9.1 9.2  ? ? ?No intake or output data in the 24 hours ending 05/03/21 0921 ? ?  ? ?  ? ?Physical Exam: ?Vital Signs ?Blood pressure (!) 141/82, pulse 71, temperature 98 ?F (36.7 ?C), temperature source Oral, resp. rate 17, height '5\' 10"'$  (1.778 m), weight 71.2 kg, SpO2 97 %. ? ? ? ? ?Constitutional: No distress . Vital signs reviewed. ?HEENT: NCAT, EOMI, oral membranes moist ?Neck: supple ?Cardiovascular: RRR without murmur. No JVD    ?Respiratory/Chest: CTA Bilaterally without wheezes or rales. Normal effort    ?GI/Abdomen: BS +, non-tender, non-distended ?Ext: no clubbing, cyanosis, or edema ?Psych: pleasant and cooperative, still a little flat  ?Skin: ?   General: Skin is warm and dry.  ?   Comments: Loop site ok, burn on right arm clean ?Neurological:  ?   Mental Status: He is alert and oriented to person, place, and time.  Language near normal, processing speed a bit better.  sensation intact in bilateral lower extremities. 5/5 strength throughout. ? ?Assessment/Plan: ?1. Functional deficits which require 3+ hours per day of interdisciplinary therapy in a comprehensive inpatient rehab setting. ?Physiatrist is providing close team supervision and 24 hour management of active medical problems listed below. ?Physiatrist and rehab team continue to  assess barriers to discharge/monitor patient progress toward functional and medical goals ? ?Care Tool: ? ?Bathing ?   ?Body parts bathed by patient: Right arm, Left arm, Chest, Abdomen, Front perineal area, Buttocks, Right upper leg, Left upper leg, Right lower leg, Left lower leg, Face  ?   ?  ?  ?Bathing assist Assist Level: Minimal Assistance - Patient > 75% ?  ?  ?Upper Body Dressing/Undressing ?Upper body dressing   ?What is the patient wearing?: Pull over shirt ?   ?Upper body assist Assist Level: Minimal Assistance - Patient > 75% ?   ?Lower Body Dressing/Undressing ?Lower body dressing ? ? ?   ?What is the patient wearing?: Pants, Underwear/pull up ? ?  ? ?Lower body assist Assist for lower body dressing: Minimal Assistance - Patient > 75% ?   ? ?Toileting ?Toileting    ?Toileting assist Assist for toileting: Contact Guard/Touching assist ?  ?  ?Transfers ?Chair/bed transfer ? ?Transfers assist ?   ? ?Chair/bed transfer assist level: Independent ?  ?  ?Locomotion ?Ambulation ? ? ?Ambulation assist ? ?   ? ?Assist level: Supervision/Verbal cueing ?Assistive device: No Device ?Max distance: 1206 ft  ? ?  Walk 10 feet activity ? ? ?Assist ?   ? ?Assist level: Supervision/Verbal cueing ?Assistive device: No Device  ? ?Walk 50 feet activity ? ? ?Assist   ? ?Assist level: Supervision/Verbal cueing ?Assistive device: No Device  ? ? ?Walk 150 feet activity ? ? ?Assist   ? ?Assist level: Supervision/Verbal cueing ?Assistive device: No Device ?  ? ?Walk 10 feet on uneven surface  ?activity ? ? ?Assist   ? ? ?Assist level: Minimal Assistance - Patient > 75% ?Assistive device: Hand held assist  ? ?Wheelchair ? ? ? ? ?Assist Is the patient using a wheelchair?: No ?  ?  ? ?  ?   ? ? ?Wheelchair 50 feet with 2 turns activity ? ? ? ?Assist ? ?  ?  ? ? ?   ? ?Wheelchair 150 feet activity  ? ? ? ?Assist ?   ? ? ?   ? ?Blood pressure (!) 141/82, pulse 71, temperature 98 ?F (36.7 ?C), temperature source Oral, resp. rate 17,  height '5\' 10"'$  (1.778 m), weight 71.2 kg, SpO2 97 %. ? ?Medical Problem List and Plan: ?1. Functional deficits secondary to left MCA scattered small infarcts, possible aspiration pneumonia, in setting of TBI 2 years ago ?            -patient may shower ?            -ELOS/Goals: 4/30 ?             ?          -Continue CIR therapies including PT, OT, and SLP  ? -f/u with me, neurology, cardiology, neuropsych, primary ?2.  Antithrombotics: ?-DVT/anticoagulation:  Mechanical:  Antiembolism stockings, knee (TED hose) Bilateral lower extremities ?            -antiplatelet therapy: Plavix (through 5/11), aspirin for 3 weeks then aspirin only ?3. Pain in toes/feet: not present currently. Ordered Tylenol as needed ?4. Depression: LCSW to evaluate and provide emotional support ?            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 -increased adderall for initiation, energy to '30mg'$  daily--seems to be having a positive effect ? -neuropsych f/u requested-Rodenbough will not be back before he leaves.  ?            -antipsychotic agents: n/a ?5. Neuropsych: This patient is not fully capable of making decisions on his own behalf. ?6. Skin/Wound Care: Routine skin care checks ?7. 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             ?9: Hypertension: continue Coreg. controlled ?10: Possible aspiration pneumonia: continue Unasyn>completed 5 days ?            -chest clear, afebrile ? - dc 'ed IV ?11: Dyslipidemia: continue Crestor ?12: Reflux: continue Protonix ?13:  Sz proph: keppra-->discuss ongoing need with outpt neurology ?14: BPH: Continue Proscar ?15: Cardiomyopathy: loop recorder placed 4/21 ?            --follow-up with Dr. 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 4/28 BUN still sl elevated to 23 ? -he's +2L since admit ? -wife is aware that he needs reminders/encouragement to push  fluids ? -discussed some strategies today ?17. Insomnia ? 4/26   trazodone reduced to '50mg'$  qhs yesterday ? -seems to be sleeping well.  ?18. Mild thrombocytosis: ?-likely reactive ?-i'm not overly concerned ?-f/u  cbc as outpt  ? ?LOS: ?7 days ?A FACE TO FACE EVALUATION WAS PERFORMED ? ?Meredith Staggers ?05/03/2021, 9:21 AM  ? ? ? ?

## 2021-05-03 NOTE — Progress Notes (Signed)
Speech Language Pathology Discharge Summary ? ?Patient Details  ?Name: Gary [name] ?MRN: [id number] ?Date of Birth: December 17, 1945 ? ?Today's Date: 05/04/2021 ?SLP Individual Time: 7092-9574 ?SLP Individual Time Calculation (min): 45 min ? ?Skilled Therapeutic Interventions:  Skilled ST treatment focused on cognitive goals. SLP facilitated session by providing min A verbal and visual cues for navigating phone usage with emphasis on locating home screen, outgoing calls, recent calls, and Siri. Following initial instruction, pt demonstrated carry over of strategies with sup-to-min A. He exhibited increased difficulty with problem solving to determine how to "go back" to previous screen when buttons were unintentionally selected. [name] daughter arrived at end of session. SLP educated on various strategies discussed during session to support phone efficiency. Patient was left in bed with alarm activated and immediate needs within reach at end of session.  ? ?Patient has met 3 of 4 long term goals.  Patient to discharge at overall Supervision;Min level.  ? ?Reasons goals not met: Patient continues to require overall Min-Mod A multimodal cues for recall with use of memory compensatory strategies.  ? ?Clinical Impression/Discharge Summary: Patient has made functional gains and has met 3 of 4 LTGs this admission. Currently, patient demonstrates improved word-finding at the conversation level and requires extra time for use of compensatory strategies. Patient also demonstrates improved problem solving and safety with mildly complex tasks but continues to require Min-Mod A multimodal cues for recall of functional information with use of memory compensatory strategies. Patient and family education is complete and patient will discharge home with 24 hour supervision. Patient would benefit from f/u SLP services to maximize his cognitive functioning and overall functional independence in order to reduce caregiver burden.  ? ?Care  Partner:  ?Caregiver Able to Provide Assistance: Yes  ?Type of Caregiver Assistance: Physical;Cognitive ? ?Recommendation:  ?24 hour supervision/assistance;Outpatient SLP  ?Rationale for SLP Follow Up: Reduce caregiver burden;Maximize cognitive function and independence  ? ?Equipment: N/A  ? ?Reasons for discharge: Discharged from hospital;Treatment goals met  ? ?Patient/Family Agrees with Progress Made and Goals Achieved: Yes  ? ? ?PAYNE, COURTNEY ?05/03/2021, 6:30 AM ? ?

## 2021-05-03 NOTE — Progress Notes (Signed)
Speech Language Pathology Daily Session Note ? ?Patient Details  ?Name: Gary [name] ?MRN: [id number] ?Date of Birth: December 19, 1945 ? ?Today's Date: 05/03/2021 ?SLP Individual Time: 1300-1400 ?SLP Individual Time Calculation (min): 60 min ? ?Short Term Goals: ?Week 1: SLP Short Term Goal 1 (Week 1): STGs=LTGs due to ELOS ? ?Skilled Therapeutic Interventions: Skilled treatment session focused on cognitive goals. Patient ambulated to the SLP office with a cane and required Min-Mod verbal cues for spatial and social awareness while ambulating in the hallway. SLP facilitated session by providing overall Min visual and verbal cues for problem solving and recall for procedures regarding how to locate and operate certain apps on his iphone (phone, messaging and camera). SLP provided both visual and written cues that patient initially utilized, however, by end of task, patient required only subtle and intermittent question cues. Patient reported clarity at end of session with increased ability to utilize his phone efficiently. Patient left upright in recliner with alarm on and all needs within reach. Continue with current plan of care.  ?   ? ?Pain ?No/Denies Pain  ? ?Therapy/Group: Individual Therapy ? ?[name] ?05/03/2021, 3:05 PM ?

## 2021-05-03 NOTE — Progress Notes (Signed)
I have reviewed medications, follow-up visits and all other discharge instructions with patient's wife via telephone. Discharge packet left with charge nurse on 05/03/2021. ?

## 2021-05-03 NOTE — Progress Notes (Addendum)
Inpatient Rehabilitation Discharge Medication Review by a Pharmacist ? ?A complete drug regimen review was completed for this patient to identify any potential clinically significant medication issues. ? ?High Risk Drug Classes Is patient taking? Indication by Medication  ?Antipsychotic No   ?Anticoagulant No   ?Antibiotic No   ?Opioid No   ?Antiplatelet Yes bASA/Plavix (DAPT through 5/10, f/b bASA alone) - CVA  ?Hypoglycemics/insulin No   ?Vasoactive Medication Yes Coreg - HTN  ?Chemotherapy No   ?Other Yes Adderal XR, Adderall, Wellbutrin XL - MDD ?Proscar - BPH ?Crestor - dyslipidemia  ?Protonix - GERD ?Keppra - seizure ppx ?Trazodone - sleep ?Astelin, zyrtec - allergies ?Neurontin - neuropathic pain ?MIV - supplementation ?Vit D - vitamin D deficiency  ?Robaxin - muscle spasms   ? ? ? ?Type of Medication Issue Identified Description of Issue Recommendation(s)  ?Drug Interaction(s) (clinically significant) ?    ?Duplicate Therapy ?    ?Allergy ?    ?No Medication Administration End Date ?    ?Incorrect Dose ?    ?Additional Drug Therapy Needed ?    ?Significant med changes from prior encounter (inform family/care partners about these prior to discharge).    ?Other ?    ? ? ?Clinically significant medication issues were identified that warrant physician communication and completion of prescribed/recommended actions by midnight of the next day:  No ? ?Time spent performing this drug regimen review (minutes):  30 ? ? ?Dani Anastasovites BS ?Pharmacy Student ?05/03/2021 11:03 AM ? ?"When you reach the end of your rope, tie a not in it and hang on" - Franklin D. Roosevelt ? ?---------------------------------------------------------------------------------------------------------- ? ?Agree with the contents of this note ? ?Croy Drumwright BS, PharmD, BCPS ?Clinical Pharmacist ?05/03/2021 11:17 AM ? ?Contact: [phone] after 3 PM ? ?"Be curious, not judgmental..." -Jamal Maes ?

## 2021-05-03 NOTE — Progress Notes (Signed)
Physical Therapy Session Note ? ?Patient Details  ?Name: Gary [name] ?MRN: [id number] ?Date of Birth: 05-07-1945 ? ?Today's Date: 05/03/2021 ?PT Individual Time: 7425-9563 ?PT Individual Time Calculation (min): 55 min  ? ?Short Term Goals: ?Week 1:  PT Short Term Goal 1 (Week 1): STGs = LTGs ? ? ?Skilled Therapeutic Interventions/Progress Updates:  ?Patient seated upright in recliner on entrance to room. Patient alert and agreeable to PT session.  ? ?Patient with no pain complaint throughout session. ? ?Therapeutic Activity: ?Transfers: Patient performed sit<>stand and stand pivot transfers throughout session with SUP/ CGA for balance. Provided verbal cues for slowing movements to decrease potential fall d/t balance. ? ?Gait Training:  ?Patient ambulated >300' x2 using no AD with overall supervision with intermittent CGA for catching L>R toe on uneven flooring. Provided vc/ tc for slightly higher step height and increased DF.  ? ?Pt guided in ambulation through obstacle course including 4 and 6" hurdles, Airex pad with self perturbations through arm movements, amb up/ down medium height foam wedge, around cones, then picking up 4# ball for overhead touches to target x10. Pt completes x2  with largest difficulty demo'd during slalom amb around cones. Pt requires extensive cueing for correct path through cones.  ? ?Neuromuscular Re-ed: ?NMR facilitated during session with focus on dynamic gait/ balance. Pt guided in ambulation through agility ladder. Progressed from stepping into each block to high knee stepping with decreased balance and several instances of need for stepping strategy to catch LOB. Decreased balance noted with stance on LLE. Then progressed to toe touches to 6" cone during step advancement. Pt demos learning in balance throughout, but initially demos difficulty with balance to LLE and knocking cones over with RLE. NMR performed for improvements in motor control and coordination, balance,  sequencing, judgement, and self confidence/ efficacy in performing all aspects of mobility at highest level of independence.  ? ?Patient seated upright  in recliner at end of session with brakes locked, belt alarm set, and all needs within reach.Oriented to time and start of next therapy session with OT starting shortly. ? ? ?Therapy Documentation ?Precautions:  ?Precautions ?Precautions: Fall ?Restrictions ?Weight Bearing Restrictions: No ?General: ?  ?Vital Signs: ?Therapy Vitals ?Temp: 97.6 ?F (36.4 ?C) ?Temp Source: Oral ?Pulse Rate: 65 ?Resp: 15 ?BP: 121/71 ?Patient Position (if appropriate): Sitting ?Oxygen Therapy ?SpO2: 100 % ?O2 Device: Room Air ?Pain: ? No pain complaint this session.  ?Mobility: ?Transfers ?Sit to Stand: Independent ?Stand to Sit: Independent ?Stand Pivot Transfers: Independent ?Transfer (Assistive device): None ?Locomotion : ?   ?Trunk/Postural Assessment : ?Cervical Assessment ?Cervical Assessment:  (forward head) ?Thoracic Assessment ?Thoracic Assessment:  (rounded shoudlers) ?Lumbar Assessment ?Lumbar Assessment:  (posterior pelvic tilt) ?Postural Control ?Postural Control: Deficits on evaluation (delayed R>L)  ?Balance: ?Balance ?Balance Assessed: Yes ?Static Sitting Balance ?Static Sitting - Level of Assistance: 7: Independent ?Static Standing Balance ?Static Standing - Level of Assistance: 7: Independent ?Dynamic Standing Balance ?Dynamic Standing - Level of Assistance: 5: Stand by assistance ? ?Therapy/Group: Individual Therapy ? ?Alger Simons ?05/03/2021, 3:50 PM  ?

## 2021-05-03 NOTE — Progress Notes (Signed)
Occupational Therapy Session Note ? ?Patient Details  ?Name: Gary [name] ?MRN: [id number] ?Date of Birth: 1945/09/06 ? ?Today's Date: 05/03/2021 ?OT Individual Time: 0383-3383 ?OT Individual Time Calculation (min): 42 min  ? ? ?Short Term Goals: ?Week 1:  OT Short Term Goal 1 (Week 1): STG=LTG d/t ELOS ? ?Skilled Therapeutic Interventions/Progress Updates:  ?  Pt received semi-reclined in bed, no c/o pain and requesting to shower, agreeable to therapy. Session focus on self-care retraining, activity tolerance, dynamic standing balance, dual tasking in prep for improved ADL/IADL/func mobility performance + decreased caregiver burden. Completed bed mobility mod I. Ambulatory walk in shower transfer with close S to CGA due tendency to furniture walk without AD. Bathed full body at sit to stand level with close S for safety as pt bumped his head against shower head x2. Denies pain and no signs of skin irritation. ? ?Completed full-body dressing including socks/shoes from sit to stand level at EOB with distant S.  ? ?Ambulated to and from Day room with cane and close S due ot occasionally brushing up against wall on R side. Pt able to participate in round of corn hole from various distances and able to keep track and total points with occasional assist to keep track. ? ?Pt left seated in recliner with safety belt alarm engaged, call bell in reach, and all immediate needs met.  ? ? ?Therapy Documentation ?Precautions:  ?Precautions ?Precautions: Fall ?Restrictions ?Weight Bearing Restrictions: No ? ?Pain: no c/o throughout ?  ?ADL: See Care Tool for more details. ? ? ?Therapy/Group: Individual Therapy ? ?Volanda Napoleon MS, OTR/L ? ?05/03/2021, 6:55 AM ?

## 2021-05-04 NOTE — Progress Notes (Signed)
?                                                       PROGRESS NOTE ? ? ?Subjective/Complaints: ? ?No c/os ?ROS: Patient denies CP, SOB, N/V/D ? ? ? ?Objective: ?  ?No results found. ? ?Recent Labs  ?  05/02/21 ?0817  ?WBC 6.8  ?HGB 15.0  ?HCT 41.0  ?PLT 579*  ? ? ? ?Recent Labs  ?  05/02/21 ?8338 05/03/21 ?0502  ?NA 140 140  ?K 4.4 3.8  ?CL 111 108  ?CO2 21* 25  ?GLUCOSE 88 86  ?BUN 24* 23  ?CREATININE 1.82* 2.09*  ?CALCIUM 9.1 9.2  ? ? ? ? ?Intake/Output Summary (Last 24 hours) at 05/04/2021 2505 ?Last data filed at 05/03/2021 3976 ?Gross per 24 hour  ?Intake 118 ml  ?Output --  ?Net 118 ml  ? ? ?  ? ?  ? ?Physical Exam: ?Vital Signs ?Blood pressure 124/71, pulse 73, temperature 97.7 ?F (36.5 ?C), temperature source Oral, resp. rate 18, height '5\' 10"'$  (1.778 m), weight 71.2 kg, SpO2 96 %. ? ? ?General: No acute distress ?Mood and affect are appropriate ?Heart: Regular rate and rhythm no rubs murmurs or extra sounds ?Lungs: Clear to auscultation, breathing unlabored, no rales or wheezes ?Abdomen: Positive bowel sounds, soft nontender to palpation, nondistended ?Extremities: No clubbing, cyanosis, or edema ? ? ?Psych: pleasant and cooperative, still a little flat  ?Skin: ?   General: Skin is warm and dry.  ?   Comments: Loop site ok, burn on right arm clean ?Neurological:  ?   Mental Status: He is alert and oriented to person, place, and time.  Language near normal, processing speed a bit better.  sensation intact in bilateral lower extremities. 5/5 strength throughout. ? ?Assessment/Plan: ?1. Functional deficits which require 3+ hours per day of interdisciplinary therapy in a comprehensive inpatient rehab setting. ?Physiatrist is providing close team supervision and 24 hour management of active medical problems listed below. ?Physiatrist and rehab team continue to assess barriers to discharge/monitor patient progress toward functional and medical goals ? ?Care Tool: ? ?Bathing ?   ?Body parts bathed by patient:  Right arm, Left arm, Chest, Abdomen, Front perineal area, Buttocks, Right upper leg, Left upper leg, Right lower leg, Left lower leg, Face  ?   ?  ?  ?Bathing assist Assist Level: Minimal Assistance - Patient > 75% ?  ?  ?Upper Body Dressing/Undressing ?Upper body dressing   ?What is the patient wearing?: Pull over shirt ?   ?Upper body assist Assist Level: Minimal Assistance - Patient > 75% ?   ?Lower Body Dressing/Undressing ?Lower body dressing ? ? ?   ?What is the patient wearing?: Pants, Underwear/pull up ? ?  ? ?Lower body assist Assist for lower body dressing: Minimal Assistance - Patient > 75% ?   ? ?Toileting ?Toileting    ?Toileting assist Assist for toileting: Supervision/Verbal cueing ?  ?  ?Transfers ?Chair/bed transfer ? ?Transfers assist ?   ? ?Chair/bed transfer assist level: Independent ?  ?  ?Locomotion ?Ambulation ? ? ?Ambulation assist ? ?   ? ?Assist level: Supervision/Verbal cueing ?Assistive device: No Device ?Max distance: 1206 ft  ? ?Walk 10 feet activity ? ? ?Assist ?   ? ?Assist level: Supervision/Verbal cueing ?Assistive device: No Device  ? ?  Walk 50 feet activity ? ? ?Assist   ? ?Assist level: Supervision/Verbal cueing ?Assistive device: No Device  ? ? ?Walk 150 feet activity ? ? ?Assist   ? ?Assist level: Supervision/Verbal cueing ?Assistive device: No Device ?  ? ?Walk 10 feet on uneven surface  ?activity ? ? ?Assist   ? ? ?Assist level: Minimal Assistance - Patient > 75% ?Assistive device: Hand held assist  ? ?Wheelchair ? ? ? ? ?Assist Is the patient using a wheelchair?: No ?  ?  ? ?  ?   ? ? ?Wheelchair 50 feet with 2 turns activity ? ? ? ?Assist ? ?  ?  ? ? ?   ? ?Wheelchair 150 feet activity  ? ? ? ?Assist ?   ? ? ?   ? ?Blood pressure 124/71, pulse 73, temperature 97.7 ?F (36.5 ?C), temperature source Oral, resp. rate 18, height '5\' 10"'$  (1.778 m), weight 71.2 kg, SpO2 96 %. ? ?Medical Problem List and Plan: ?1. 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            -patient may shower ?            -ELOS/Goals: 4/30 ?             ?          -Continue CIR therapies including PT, OT, and SLP  ? -f/u with me, neurology, cardiology, neuropsych, primary ?2.  Antithrombotics: ?-DVT/anticoagulation:  Mechanical:  Antiembolism stockings, knee (TED hose) Bilateral lower extremities ?            -antiplatelet therapy: Plavix (through 5/11), aspirin for 3 weeks then aspirin only ?3. Pain in toes/feet: not present currently. Ordered Tylenol as needed ?4. Depression: LCSW to evaluate and provide emotional support ?            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 -increased adderall for initiation, energy to '30mg'$  daily--seems to be having a positive effect ?  ?            -antipsychotic agents: n/a ?5. Neuropsych: This patient is not fully capable of making decisions on his own behalf. ?6. Skin/Wound Care: Routine skin care checks ?7. 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             ?9: Hypertension: continue Coreg. controlled ?10: Possible aspiration pneumonia: continue Unasyn>completed 5 days ?            -chest clear, afebrile ? - dc 'ed IV ?11: Dyslipidemia: continue Crestor ?12: Reflux: continue Protonix ?13:  Sz proph: keppra-->discuss ongoing need with outpt neurology ?14: BPH: Continue Proscar ?15: Cardiomyopathy: loop recorder placed 4/21 ?            --follow-up with Dr. 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 4/28 BUN still sl elevated to 23 ? -he's +2L since admit- recent recorded intake is low  ? -wife is aware that he needs reminders/encouragement to push fluids ?Will need nephro f/u post d/c ?17. Insomnia ? 4/26   trazodone reduced to '50mg'$  qhs yesterday ? -seems to be sleeping well.  ?18. Mild thrombocytosis: ?-likely reactive ?-i'm not overly concerned ?-f/u  cbc as outpt  ? ?LOS: ?8 days ?A FACE TO FACE EVALUATION WAS PERFORMED ? ?Luanna Salk Vinod Mikesell ?05/04/2021, 7:22 AM  ? ? ? ?

## 2021-05-04 NOTE — Progress Notes (Signed)
Physical Therapy Session Note ? ?Patient Details  ?Name: Gary [name] ?MRN: [id number] ?Date of Birth: Aug 24, 1945 ? ?Today's Date: 05/04/2021 ?PT Individual Time: 1006-1100 ?PT Individual Time Calculation (min): 54 min  ? ?Short Term Goals: ?Week 1:  PT Short Term Goal 1 (Week 1): STGs = LTGs ? ? ?Skilled Therapeutic Interventions/Progress Updates:  ?Patient seated in recliner on entrance to room. Patient alert and agreeable to PT session.  ? ?Patient with no pain complaint throughout session. ? ?Therapeutic Activity: ?Transfers: Patient performed sit<>stand and stand pivot transfers throughout session with supervision. No cues required this session. ? ?Gait Training:  ?Patient ambulated >300 ftx3 using no AD with supervision. Provided vc/ tc for increased step height/ length especially with approach to uneven floor. ? ?Neuromuscular Re-ed: ?NMR facilitated during session with focus on standing balance. Pt guided in toe touches to target with focus on slow movements when standing on LLE as pt demos increased difficulty with balance with weight shift to L side. Pt also guided in use of balance board with side to side taps initially and then progressing to balance of board in middle. Pt demos difficulty in use of LLE to find midline balance as well as difficulty in pushing LLE to floor consistently. Recommended to pt to continue neuro based therapy with Neuro OP clinics here in town. NMR performed for improvements in motor control and coordination, balance, sequencing, judgement, and self confidence/ efficacy in performing all aspects of mobility at highest level of independence.  ? ?Pt provided with printed therex as recommended by primary therapist. Recommended for pt to perofrm in presence of supervision and with chair present when standing in order to rest as necessary or for disruption in balance.  ? ?Patient seated  in recliner at end of session with brakes locked, belt alarm set, and all needs within  reach. ? ? ?Therapy Documentation ?Precautions:  ?Precautions ?Precautions: Fall ?Restrictions ?Weight Bearing Restrictions: No ?General: ?  ?Vital Signs: ?Therapy Vitals ?Temp: 98 ?F (36.7 ?C) ?Temp Source: Oral ?Pulse Rate: 66 ?Resp: 16 ?BP: 118/60 ?Patient Position (if appropriate): Sitting ?Oxygen Therapy ?SpO2: 99 % ?O2 Device: Room Air ?Pain: ? No pain complaint this session.  ? ?Therapy/Group: Individual Therapy ? ?Alger Simons ?05/04/2021, 2:15 PM  ?

## 2021-05-04 NOTE — Plan of Care (Signed)
?  Problem: RH Memory ?Goal: LTG Patient will use memory compensatory aids to (SLP) ?Description: LTG:  Patient will use memory compensatory aids to recall biographical/new, daily complex information with cues (SLP) ?Outcome: Not Met (add Reason) ?Note: Patient continues to require overall Min-Mod A multimodal cues for recall with use of memory compensatory strategies.  ?  ?Problem: RH Expression Communication ?Goal: LTG Patient will verbally express basic/complex needs(SLP) ?Description: LTG:  Patient will verbally express basic/complex needs, wants or ideas with cues  (SLP) ?Outcome: Completed/Met ?Goal: LTG Patient will increase word finding of common (SLP) ?Description: LTG:  Patient will increase word finding of common objects/daily info/abstract thoughts with cues using compensatory strategies (SLP). ?Outcome: Completed/Met ?  ?Problem: RH Problem Solving ?Goal: LTG Patient will demonstrate problem solving for (SLP) ?Description: LTG:  Patient will demonstrate problem solving for basic/complex daily situations with cues  (SLP) ?Outcome: Completed/Met ?  ?

## 2021-05-05 NOTE — Progress Notes (Signed)
?                                                       PROGRESS NOTE ? ? ?Subjective/Complaints: ? ?No c/os ?ROS: Patient denies CP, SOB, N/V/D ? ? ? ?Objective: ?  ?No results found. ? ?Recent Labs  ?  05/02/21 ?0817  ?WBC 6.8  ?HGB 15.0  ?HCT 41.0  ?PLT 579*  ? ? ? ?Recent Labs  ?  05/03/21 ?0502  ?NA 140  ?K 3.8  ?CL 108  ?CO2 25  ?GLUCOSE 86  ?BUN 23  ?CREATININE 2.09*  ?CALCIUM 9.2  ? ? ? ? ?Intake/Output Summary (Last 24 hours) at 05/05/2021 0751 ?Last data filed at 05/05/2021 7425 ?Gross per 24 hour  ?Intake 600 ml  ?Output 400 ml  ?Net 200 ml  ? ? ? ?  ? ?  ? ?Physical Exam: ?Vital Signs ?Blood pressure (!) 119/57, pulse 67, temperature 97.9 ?F (36.6 ?C), temperature source Oral, resp. rate 18, height '5\' 10"'$  (1.778 m), weight 71.2 kg, SpO2 97 %. ? ? ?General: No acute distress ?Mood and affect are appropriate ?Heart: Regular rate and rhythm no rubs murmurs or extra sounds ?Lungs: Clear to auscultation, breathing unlabored, no rales or wheezes ?Abdomen: Positive bowel sounds, soft nontender to palpation, nondistended ?Extremities: No clubbing, cyanosis, or edema ? ? ?Psych: pleasant and cooperative, still a little flat  ?Skin: ?   General: Skin is warm and dry.  ?   Comments: Loop site ok, burn on right arm clean ?Neurological:  ?   Mental Status: He is alert and oriented to person, place, and time.  Language near normal, processing speed a bit better.  sensation intact in bilateral lower extremities. 5/5 strength throughout. ? ?Assessment/Plan: ?1. Functional deficits Left MCA infarct  ?Stable for D/C today ?F/u PCP in 3-4 weeks ?F/u PM&R 2 weeks ?See D/C summary ?See D/C instructions  ? ?Care Tool: ? ?Bathing ?   ?Body parts bathed by patient: Right arm, Left arm, Chest, Abdomen, Front perineal area, Buttocks, Right upper leg, Left upper leg, Right lower leg, Left lower leg, Face  ?   ?  ?  ?Bathing assist Assist Level: Supervision/Verbal cueing ?  ?  ?Upper Body Dressing/Undressing ?Upper body dressing    ?What is the patient wearing?: Pull over shirt ?   ?Upper body assist Assist Level: Set up assist ?   ?Lower Body Dressing/Undressing ?Lower body dressing ? ? ?   ?What is the patient wearing?: Pants, Underwear/pull up ? ?  ? ?Lower body assist Assist for lower body dressing: Supervision/Verbal cueing ?   ? ?Toileting ?Toileting    ?Toileting assist Assist for toileting: Supervision/Verbal cueing ?  ?  ?Transfers ?Chair/bed transfer ? ?Transfers assist ?   ? ?Chair/bed transfer assist level: Independent ?  ?  ?Locomotion ?Ambulation ? ? ?Ambulation assist ? ?   ? ?Assist level: Supervision/Verbal cueing ?Assistive device: No Device ?Max distance: 1206 ft  ? ?Walk 10 feet activity ? ? ?Assist ?   ? ?Assist level: Supervision/Verbal cueing ?Assistive device: No Device  ? ?Walk 50 feet activity ? ? ?Assist   ? ?Assist level: Supervision/Verbal cueing ?Assistive device: No Device  ? ? ?Walk 150 feet activity ? ? ?Assist   ? ?Assist level: Supervision/Verbal cueing ?Assistive device: No Device ?  ? ?  Walk 10 feet on uneven surface  ?activity ? ? ?Assist   ? ? ?Assist level: Supervision/Verbal cueing ?Assistive device: Hand held assist  ? ?Wheelchair ? ? ? ? ?Assist Is the patient using a wheelchair?: No ?  ?  ? ?  ?   ? ? ?Wheelchair 50 feet with 2 turns activity ? ? ? ?Assist ? ?  ?  ? ? ?   ? ?Wheelchair 150 feet activity  ? ? ? ?Assist ?   ? ? ?   ? ?Blood pressure (!) 119/57, pulse 67, temperature 97.9 ?F (36.6 ?C), temperature source Oral, resp. rate 18, height '5\' 10"'$  (1.778 m), weight 71.2 kg, SpO2 97 %. ? ?Medical Problem List and Plan: ?1. Functional deficits secondary to left MCA scattered small infarcts, possible aspiration pneumonia, in setting of TBI 2 years ago ?            -patient may shower ?            -ELOS/Goals: 4/30 ?             ?          -Continue CIR therapies including PT, OT, and SLP  ? -f/u with me, neurology, cardiology, neuropsych, primary ?2.  Antithrombotics: ?-DVT/anticoagulation:   Mechanical:  Antiembolism stockings, knee (TED hose) Bilateral lower extremities ?            -antiplatelet therapy: Plavix (through 5/11), aspirin for 3 weeks then aspirin only ?3. Pain in toes/feet: not present currently. Ordered Tylenol as needed ?4. Depression: LCSW to evaluate and provide emotional support ?            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 -increased adderall for initiation, energy to '30mg'$  daily--seems to be having a positive effect ?  ?            -antipsychotic agents: n/a ?5. Neuropsych: This patient is not fully capable of making decisions on his own behalf. ?6. Skin/Wound Care: Routine skin care checks ?7. 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             ?9: Hypertension: continue Coreg. controlled ?10: Possible aspiration pneumonia: continue Unasyn>completed 5 days ?            -chest clear, afebrile ? - dc 'ed IV ?11: Dyslipidemia: continue Crestor ?12: Reflux: continue Protonix ?13:  Sz proph: keppra-->discuss ongoing need with outpt neurology ?14: BPH: Continue Proscar ?15: Cardiomyopathy: loop recorder placed 4/21 ?            --follow-up with Dr. 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 4/28 BUN still sl elevated to 23 ? -he's +2L since admit- recent recorded intake is low  ? -wife is aware that he needs reminders/encouragement to push fluids ?Will need nephro f/u post d/c ?17. Insomnia ? 4/26   trazodone reduced to '50mg'$  qhs yesterday ? -seems to be sleeping well.  ?18. Mild thrombocytosis: ?-likely reactive ?-i'm not overly concerned ?-f/u [facility] as outpt  ? ?LOS: ?9 days ?A FACE TO FACE EVALUATION WAS PERFORMED ? ?Luanna Salk Tiffany Calmes ?05/05/2021, 7:51 AM  ? ? ? ?

## 2021-05-06 NOTE — Progress Notes (Signed)
Inpatient Rehabilitation Care Coordinator ?Discharge Note  ? ?Patient Details  ?Name: Gary [name] ?MRN: [id number] ?Date of Birth: 09-24-1945 ? ? ?Discharge location: D/c to  home ? ?Length of Stay: 8 days ? ?Discharge activity level: Supervision/CGA ? ?Home/community participation: Limited ? ?Patient response UR:KYHCWC Literacy - How often do you need to have someone help you when you read instructions, pamphlets, or other written material from your doctor or pharmacy?: Never ? ?Patient response BJ:SEGBTD Isolation - How often do you feel lonely or isolated from those around you?: Never ? ?Services provided included: MD, RD, OT, SLP, RN, Pharmacy, Neuropsych, SW, TR, CM, PT ? ?Financial Services:  ?Charity fundraiser Utilized: Multimedia programmer (Medicare A/B (secondary)) ?[employer] ? ?Choices offered to/list presented to: Yes ? ?Follow-up services arranged:  ?Outpatient ?   ?Outpatient Servicies: [facility] for PT/OT/SLP ?  ?  ? ?Patient response to transportation need: ?Is the patient able to respond to transportation needs?: Yes ?In the past 12 months, has lack of transportation kept you from medical appointments or from getting medications?: No ?In the past 12 months, has lack of transportation kept you from meetings, work, or from getting things needed for daily living?: No ? ? ?Comments (or additional information): ? ?Patient/Family verbalized understanding of follow-up arrangements:  Yes ? ?Individual responsible for coordination of the follow-up plan: contact pt wife Gary [name] ? ?Confirmed correct DME delivered: [name] 05/06/2021   ? ?[name] ?

## 2021-05-08 ENCOUNTER — Ambulatory Visit: Payer: 59 | Admitting: Physical Therapy

## 2021-05-08 ENCOUNTER — Ambulatory Visit: Payer: 59

## 2021-05-08 ENCOUNTER — Encounter: Payer: 59 | Admitting: Occupational Therapy

## 2021-05-13 ENCOUNTER — Encounter: Payer: Self-pay | Admitting: Registered Nurse

## 2021-05-13 ENCOUNTER — Other Ambulatory Visit: Payer: Self-pay

## 2021-05-13 ENCOUNTER — Ambulatory Visit (INDEPENDENT_AMBULATORY_CARE_PROVIDER_SITE_OTHER): Payer: 59 | Admitting: Registered Nurse

## 2021-05-13 ENCOUNTER — Other Ambulatory Visit: Payer: 59

## 2021-05-13 VITALS — BP 143/70 | HR 73 | Temp 98.1°F | Resp 18 | Ht 70.0 in | Wt 160.0 lb

## 2021-05-13 DIAGNOSIS — Z09 Encounter for follow-up examination after completed treatment for conditions other than malignant neoplasm: Secondary | ICD-10-CM

## 2021-05-13 LAB — CBC WITH DIFFERENTIAL/PLATELET
Absolute Monocytes: 608 cells/uL (ref 200–950)
Basophils Absolute: 68 cells/uL (ref 0–200)
Basophils Relative: 0.9 %
Eosinophils Absolute: 319 cells/uL (ref 15–500)
Eosinophils Relative: 4.2 %
HCT: 44.3 % (ref 38.5–50.0)
Hemoglobin: 15.4 g/dL (ref 13.2–17.1)
Lymphs Abs: 1041 cells/uL (ref 850–3900)
MCH: 31.3 pg (ref 27.0–33.0)
MCHC: 34.8 g/dL (ref 32.0–36.0)
MCV: 90 fL (ref 80.0–100.0)
MPV: 9.6 fL (ref 7.5–12.5)
Monocytes Relative: 8 %
Neutro Abs: 5563 cells/uL (ref 1500–7800)
Neutrophils Relative %: 73.2 %
Platelets: 586 10*3/uL — ABNORMAL HIGH (ref 140–400)
RBC: 4.92 10*6/uL (ref 4.20–5.80)
RDW: 15.4 % — ABNORMAL HIGH (ref 11.0–15.0)
Total Lymphocyte: 13.7 %
WBC: 7.6 10*3/uL (ref 3.8–10.8)

## 2021-05-13 LAB — COMPREHENSIVE METABOLIC PANEL
ALT: 15 U/L (ref 0–53)
AST: 24 U/L (ref 0–37)
Albumin: 3.9 g/dL (ref 3.5–5.2)
Alkaline Phosphatase: 86 U/L (ref 39–117)
BUN: 22 mg/dL (ref 6–23)
CO2: 29 mEq/L (ref 19–32)
Calcium: 9.3 mg/dL (ref 8.4–10.5)
Chloride: 106 mEq/L (ref 96–112)
Creatinine, Ser: 1.99 mg/dL — ABNORMAL HIGH (ref 0.40–1.50)
GFR: 32.69 mL/min — ABNORMAL LOW (ref >60.00)
Glucose, Bld: 78 mg/dL (ref 70–99)
Potassium: 5.1 mEq/L (ref 3.5–5.1)
Sodium: 143 mEq/L (ref 135–145)
Total Bilirubin: 0.5 mg/dL (ref 0.2–1.2)
Total Protein: 6.9 g/dL (ref 6.0–8.3)

## 2021-05-13 NOTE — Patient Instructions (Signed)
Mr. Discher -  ? ?Great to see you ? ?Lets recheck labs ? ?I'll reach out regarding anticoagulants ? ?Thank you ? ?Rich  ?

## 2021-05-13 NOTE — Progress Notes (Signed)
? ?Established Patient Office Visit ? ?Subjective:  ?Patient ID: Gary [name], male    DOB: [birth year]  Age: 76 y.o. MRN: [id number] ? ?CC:  ?Chief Complaint  ?Patient presents with  ? Hospitalization Follow-up  ?  Patient states he is here for a hospital f/u for a stroke on 04/23/2021  ? ? ?HPI ?Gary [name] presents for HFU ? ?CVA on 04/23/21 ?Presented to ER after pt wife noted shaking in arm followed by universal shaking.  ?Pt was confused, wife called EMS ?EMS noted aphasia an R side facial droop ? ?On admission, he had MRI showing numerous small infarcts in L MCA, petechial hemorrhage at L posterior insula. ?Given TNK as concern for cardioembolic source given his known cardiomyopathy. ? ?Echo during admission showed EF 35-40% ? ?Dc on plavix and ASA x 3 weeks then ASA alone.  ? ?D/c to inpatient therpay on 04/26/21, did well here, d/c from inpatient therapy on 05/05/21. ? ?Doing well since. Back to his previous baseline. Here with wife today.  ?Notes his previous foot pain has resolved - curious to know if this had been a clot.  ? ?He had been on xarelto in the past for DVT but dc 2-3 mo ago per cardiology as he had completed course. ? ?He does have a loop recorder placed by Dr. Sallyanne Kuster during admission.  ? ?Outpatient Medications Prior to Visit  ?Medication Sig Dispense Refill  ? acetaminophen (TYLENOL) 325 MG tablet Take 1-2 tablets (325-650 mg total) by mouth every 4 (four) hours as needed for mild pain.    ? amphetamine-dextroamphetamine (ADDERALL XR) 20 MG 24 hr capsule Take 1 capsule (20 mg total) by mouth daily. 30 capsule 0  ? amphetamine-dextroamphetamine (ADDERALL) 10 MG tablet Take 1 tablet (10 mg total) by mouth daily. 30 tablet 0  ? aspirin 81 MG EC tablet Take 1 tablet (81 mg total) by mouth daily. Swallow whole. 30 tablet 0  ? azelastine (ASTELIN) 0.1 % nasal spray Place 1 spray into both nostrils 2 (two) times daily. Use in each nostril as directed 30 mL 12  ? b complex vitamins capsule  Take 1 capsule by mouth daily.    ? buPROPion (WELLBUTRIN XL) 300 MG 24 hr tablet TAKE 1 TABLET(300 MG) BY MOUTH DAILY 30 tablet 4  ? carvedilol (COREG) 3.125 MG tablet Take 1 tablet (3.125 mg total) by mouth 2 (two) times daily with a meal. 60 tablet 0  ? cetirizine (ZYRTEC) 10 MG tablet Take 1 tablet (10 mg total) by mouth daily. 30 tablet 11  ? clopidogrel (PLAVIX) 75 MG tablet Take 1 tablet (75 mg total) by mouth daily. 30 tablet 0  ? diclofenac Sodium (VOLTAREN) 1 % GEL Apply 2 g topically 4 (four) times daily as needed (foot pain).    ? finasteride (PROSCAR) 5 MG tablet Take 1 tablet (5 mg total) by mouth daily. 30 tablet 0  ? levETIRAcetam (KEPPRA) 500 MG tablet Take 1 tablet (500 mg total) by mouth 2 (two) times daily. 30 tablet 0  ? Multiple Vitamin (MULTIVITAMIN WITH MINERALS) TABS tablet Take 1 tablet by mouth daily.    ? pantoprazole (PROTONIX) 40 MG tablet Take 1 tablet (40 mg total) by mouth daily. 30 tablet 0  ? rosuvastatin (CRESTOR) 20 MG tablet Take 1 tablet (20 mg total) by mouth daily. 30 tablet 0  ? ?No facility-administered medications prior to visit.  ? ? ?Review of Systems  ?Constitutional: Negative.   ?HENT: Negative.    ?Eyes: Negative.   ?  Respiratory: Negative.    ?Cardiovascular: Negative.   ?Gastrointestinal: Negative.   ?Endocrine: Negative.   ?Genitourinary: Negative.   ?Musculoskeletal: Negative.   ?Skin: Negative.   ?Allergic/Immunologic: Negative.   ?Neurological: Negative.   ?Hematological: Negative.   ?Psychiatric/Behavioral: Negative.    ?All other systems reviewed and are negative. ? ?  ?Objective:  ?  ? ?BP (!) 143/70   Pulse 73   Temp 98.1 ?F (36.7 ?C) (Temporal)   Resp 18   Ht '5\' 10"'$  (1.778 m)   Wt 160 lb (72.6 kg)   SpO2 100%   BMI 22.96 kg/m?  ? ?Wt Readings from Last 3 Encounters:  ?05/13/21 160 lb (72.6 kg)  ?04/26/21 156 lb 15.5 oz (71.2 kg)  ?04/23/21 164 lb 14.5 oz (74.8 kg)  ? ?Physical Exam ?Constitutional:   ?   General: He is not in acute distress. ?    Appearance: Normal appearance. He is normal weight. He is not ill-appearing, toxic-appearing or diaphoretic.  ?Cardiovascular:  ?   Rate and Rhythm: Normal rate and regular rhythm.  ?   Heart sounds: Normal heart sounds. No murmur heard. ?  No friction rub. No gallop.  ?Pulmonary:  ?   Effort: Pulmonary effort is normal. No respiratory distress.  ?   Breath sounds: Normal breath sounds. No stridor. No wheezing, rhonchi or rales.  ?Chest:  ?   Chest wall: No tenderness.  ?Neurological:  ?   General: No focal deficit present.  ?   Mental Status: He is alert and oriented to person, place, and time. Mental status is at baseline.  ?Psychiatric:     ?   Mood and Affect: Mood normal.     ?   Behavior: Behavior normal.     ?   Thought Content: Thought content normal.     ?   Judgment: Judgment normal.  ? ? ?Results for orders placed or performed in visit on 05/13/21  ?Comprehensive metabolic panel  ?Result Value Ref Range  ? Sodium 143 135 - 145 mEq/L  ? Potassium 5.1 3.5 - 5.1 mEq/L  ? Chloride 106 96 - 112 mEq/L  ? CO2 29 19 - 32 mEq/L  ? Glucose, Bld 78 70 - 99 mg/dL  ? BUN 22 6 - 23 mg/dL  ? Creatinine, Ser 1.99 (H) 0.40 - 1.50 mg/dL  ? Total Bilirubin 0.5 0.2 - 1.2 mg/dL  ? Alkaline Phosphatase 86 39 - 117 U/L  ? AST 24 0 - 37 U/L  ? ALT 15 0 - 53 U/L  ? Total Protein 6.9 6.0 - 8.3 g/dL  ? Albumin 3.9 3.5 - 5.2 g/dL  ? GFR 32.69 (L) >60.00 mL/min  ? Calcium 9.3 8.4 - 10.5 mg/dL  ? ? ? ? ?The ASCVD Risk score (Arnett DK, et al., 2019) failed to calculate for the following reasons: ?  The patient has a prior MI or stroke diagnosis ? ?  ?Assessment & Plan:  ? ?Problem List Items Addressed This Visit   ?None ?Visit Diagnoses   ? ? Hospital discharge follow-up    -  Primary  ? Relevant Orders  ? CBC with Differential/Platelet  ? Comprehensive metabolic panel (Completed)  ? ?  ? ? ?No orders of the defined types were placed in this encounter. ? ? ?Return in about 3 months (around 08/13/2021) for Chronic Conditions.   ? ?PLAN ?Labs collected. Will follow up with the patient as warranted. ?Continue plavix to finish on Thursday. Follow up as scheduled with Cardiology  and Neuro. ?We had an in depth discussion on likely cardioembolic event leading to CVA and what next steps would be for prevention of further events.  ?Patient encouraged to call clinic with any questions, comments, or concerns. ? ?Maximiano Coss, NP ?

## 2021-05-14 ENCOUNTER — Telehealth: Payer: Self-pay | Admitting: Physician Assistant

## 2021-05-14 ENCOUNTER — Other Ambulatory Visit: Payer: Self-pay | Admitting: Registered Nurse

## 2021-05-14 DIAGNOSIS — R7989 Other specified abnormal findings of blood chemistry: Secondary | ICD-10-CM

## 2021-05-14 NOTE — Telephone Encounter (Signed)
Scheduled appt per 5/9 referral. Pt is aware of appt date and time. Pt is aware to arrive 15 mins prior to appt time and to bring and updated insurance card. Pt is aware of appt location.   ?

## 2021-05-17 ENCOUNTER — Other Ambulatory Visit: Payer: Self-pay | Admitting: Physician Assistant

## 2021-05-22 ENCOUNTER — Encounter: Payer: Self-pay | Admitting: Physical Medicine & Rehabilitation

## 2021-05-23 ENCOUNTER — Other Ambulatory Visit: Payer: Self-pay | Admitting: Registered Nurse

## 2021-05-23 DIAGNOSIS — B351 Tinea unguium: Secondary | ICD-10-CM

## 2021-05-23 NOTE — Telephone Encounter (Signed)
Patient would like a medication refill on TERBINAFINE '250MG'$  TABLETS. This medication was Discontinued by another provider just forwarding it to see if it is appropriate.
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
## 2021-05-29 ENCOUNTER — Encounter: Payer: Self-pay | Admitting: Physical Medicine & Rehabilitation

## 2021-05-29 ENCOUNTER — Encounter
Payer: No Typology Code available for payment source | Attending: Psychology | Admitting: Physical Medicine & Rehabilitation

## 2021-05-29 VITALS — BP 158/81 | HR 67 | Ht 70.0 in | Wt 163.2 lb

## 2021-05-29 DIAGNOSIS — Z87820 Personal history of traumatic brain injury: Secondary | ICD-10-CM | POA: Insufficient documentation

## 2021-05-29 DIAGNOSIS — G44329 Chronic post-traumatic headache, not intractable: Secondary | ICD-10-CM | POA: Diagnosis present

## 2021-05-29 DIAGNOSIS — M47816 Spondylosis without myelopathy or radiculopathy, lumbar region: Secondary | ICD-10-CM | POA: Diagnosis present

## 2021-05-29 DIAGNOSIS — I7300 Raynaud's syndrome without gangrene: Secondary | ICD-10-CM | POA: Insufficient documentation

## 2021-05-29 DIAGNOSIS — F900 Attention-deficit hyperactivity disorder, predominantly inattentive type: Secondary | ICD-10-CM | POA: Diagnosis present

## 2021-05-29 MED ORDER — TRAMADOL HCL 50 MG PO TABS: 25.0000 mg | ORAL_TABLET | Freq: Every day | ORAL | 2 refills | Status: DC | PRN

## 2021-05-29 MED ORDER — CARVEDILOL 3.125 MG PO TABS: 3.1250 mg | ORAL_TABLET | Freq: Two times a day (BID) | ORAL | 3 refills | Status: DC

## 2021-05-29 MED ORDER — ROSUVASTATIN CALCIUM 20 MG PO TABS: 20.0000 mg | ORAL_TABLET | Freq: Every day | ORAL | 3 refills | Status: DC

## 2021-05-29 MED ORDER — AMPHETAMINE-DEXTROAMPHET ER 20 MG PO CP24: 20.0000 mg | ORAL_CAPSULE | Freq: Every day | ORAL | 0 refills | Status: DC

## 2021-05-29 MED ORDER — AMLODIPINE BESYLATE 2.5 MG PO TABS: 2.5000 mg | ORAL_TABLET | Freq: Every day | ORAL | 3 refills | Status: DC

## 2021-05-29 MED ORDER — AMPHETAMINE-DEXTROAMPHETAMINE 20 MG PO TABS: 20.0000 mg | ORAL_TABLET | Freq: Every day | ORAL | 0 refills | Status: DC

## 2021-05-29 NOTE — Progress Notes (Signed)
Subjective:    Patient ID: Gary [name], male    DOB: [birth year], 76 y.o.   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  He was with [place] on inpatient rehab and then was discharged to home.  Etiology of his stroke is still cryptogenic.  It sounds as if his monitor did not produce any abnormalities thus far.  He has follow-up with cardiology upcoming as well as with neurology.  He feels that his balance is improving since being home and after inpatient rehab.  He has  outpatient therapy upcoming starting tomorrow morning.  He is using a cane when he walks outside the house but going without when he gets home.  He can get tripped up when he is turning around/changing directions.  Also his mentation is not quite to where he was before.  He remains on Adderall which she is taking 20 mg of in the morning of the extended release and 20 mg in the afternoon.  He feels that his fatigue levels are about the same.  Appetite is fair.  He is sleeping fairly well for the most part.  Mood may be a bit more upbeat but it does not seem to be too far off baseline.  The plan was to continue with Keppra until he saw neurology.  Not sure that he still is on the medication at this point.  I recently refilled his Keppra prescription.  Pain Inventory Average Pain 0 Pain Right Now 0 My pain is  No pain  LOCATION OF PAIN  No pain  BOWEL Number of stools per week: 7   BLADDER Normal    Mobility use a cane how many minutes can you walk? 55  Function disabled: date disabled 2021 I need assistance with the following:  meal prep and household duties  Neuro/Psych trouble walking dizziness confusion  Prior Studies Any changes since last visit?  no  Physicians involved in your care Any changes since last visit?  no   Family History  Problem Relation Age of Onset   Heart disease Mother    Hypertension Mother    Stroke Mother     Social History   Socioeconomic History   Marital status: Married    Spouse name: Gary [name]   Number of children: Not on file   Years of education: Not on file   Highest education level: Not on file  Occupational History   Occupation: Warden/ranger    Comment: [employer]  Tobacco Use   Smoking status: Former   Smokeless tobacco: Never  Scientific laboratory technician Use: Never used  Substance and Sexual Activity   Alcohol use: Not Currently   Drug use: Never   Sexual activity: Yes  Other Topics Concern   Not on file  Social History Narrative   ** Merged History Encounter **       Social Determinants of Health   Financial Resource Strain: Not on file  Food Insecurity: Not on file  Transportation Needs: Not on file  Physical Activity: Not on file  Stress: Not on file  Social Connections: Not on file   Past Surgical History:  Procedure Laterality Date   LOOP RECORDER INSERTION N/A 04/26/2021   Procedure: [place];  Surgeon: Sanda Klein, MD;  Location: [facility];  Service: Cardiovascular;  Laterality: N/A;   MINOR REMOVAL OF MANDIBULAR HARDWARE N/A 12/15/2019   Procedure: REMOVAL  OF RIGHT LATERAL ORBITAL MINIPLATE;  Surgeon: [name], DO;  Location: [facility];  Service: Plastics;  Laterality: N/A;   ORIF MANDIBULAR FRACTURE Bilateral 07/27/2019   Procedure: OPEN REDUCTION INTERNAL FIXATION (ORIF) OF COMPLEX ZYGOMATIC FRACTURE;  Surgeon: [name], DO;  Location: [facility];  Service: Plastics;  Laterality: Bilateral;  2 hours, please   Past Medical History:  Diagnosis Date   ADHD    Allergy    CKD (chronic kidney disease)    GERD (gastroesophageal reflux disease)    History of chickenpox    History of diverticulitis 2007   History of kidney stones    HTN (hypertension)    Hypertension    Reflux    Renal disorder    kidney stones   TBI (traumatic brain injury) ([place] [facility]) 08/11/2019   TBI (traumatic brain injury) ([place]) 08/11/2019    Trauma    BP (!) 153/76   Pulse 70   Ht '5\' 10"'$  (1.778 m)   Wt 163 lb 3.2 oz (74 kg)   SpO2 99%   BMI 23.42 kg/m   Opioid Risk Score:   Fall Risk Score:  `1  Depression screen [facility] 2/9     05/29/2021   11:07 AM 01/30/2021   11:37 AM 10/26/2020    9:44 AM 10/03/2020   11:40 AM 03/21/2020   10:11 AM 11/16/2019    9:08 AM 09/21/2019    9:18 AM  Depression screen PHQ 2/9  Decreased Interest 0  '1 2 1 '$ 0 1  Down, Depressed, Hopeless 0 '1 1 2 1 '$ 0 0  PHQ - 2 Score 0 '1 2 4 2 '$ 0 1  Altered sleeping   0    2  Tired, decreased energy   3    2  Change in appetite   0    0  Feeling bad or failure about yourself    0    0  Trouble concentrating   3    3  Moving slowly or fidgety/restless   2    3  Suicidal thoughts   0    0  PHQ-9 Score   10    11  Difficult doing work/chores   Somewhat difficult         Review of Systems  Constitutional: Negative.   HENT: Negative.    Eyes: Negative.   Respiratory:  Positive for shortness of breath.   Cardiovascular: Negative.   Gastrointestinal: Negative.   Endocrine: Negative.   Genitourinary: Negative.   Musculoskeletal:  Positive for gait problem.  Skin: Negative.   Allergic/Immunologic: Negative.   Neurological:  Positive for dizziness.  Hematological: Negative.   Psychiatric/Behavioral:  Positive for confusion.       Objective:   Physical Exam   Gen: no distress, normal appearing HEENT: oral mucosa pink and moist, NCAT Cardio: Reg rate Chest: normal effort, normal rate of breathing Abd: soft, non-distended Ext: no edema Psych: pleasant, normal affect Skin: Hands were cool in the distal half of his fingers were actually blue initially on examination.  They seem to get a bit better once he got moving.  The hands were slightly sensitive to touch. Neuro: Patient is alert and oriented to month but not the date or year.  He knew the day of the week.  He is able to follow basic commands.  He did lag in processing and demonstrated some  impairment in his insight and awareness.  Cranial nerve exam was somewhat nonfocal.  Strength is grossly 5  out of 5.  He needs extra time to move from sit to stand.  Gait was fairly steady.  He did take his time when he turned around although his feet did tangled up a bit.  No focal limb ataxia was seen.  No gross sensory abnormalities. Musculoskeletal: Low back tenderness     PLAN: 1. Functional deficits secondary to left MCA scattered small infarcts, Hx of TBI 2 years ago             -outpt therapies start tomorrow 2.  Antithrombotics: On ASA alone 3. Pain in toes/feet/fingers and low back pain: He may be experiencing a Raynaud's type phenomenon in his fingers and toes.  We will try him on low-dose Norvasc, 2.5 mg daily as his blood pressure is up a bit.  -Advised him to keep his hands and feet warm as possible.  -Increase/maintain physical activity as well.  -Gave him a few Tylenol to use for his low back,25-'50mg'$  qd prn 4. Depression: LCSW to evaluate and provide emotional support             --reactive depression: continue Wellbutrin.      -Mood seems a bit more upbeat today.  Something we will need to continue following             -Refilled Adderall XL 20 mg daily as well as Adderall immediate release 20 mg daily in the afternoon             -Would like Dr. Sima Matas to see this patient as well                            9: Hypertension: continue Coreg. 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 Insomnia             Seems improved, off trazodone 18. Mild thrombocytosis: -likely reactive -Patient will see hematology for follow-up.  Apparently he has a family history of polycythemia as well   Thirty minutes of face to face patient care time were spent during this visit. All questions were encouraged and answered. Follow up with me in 2 mos. Also reviewed case with WC case Freight forwarder.

## 2021-05-29 NOTE — Patient Instructions (Signed)
PLEASE FEEL FREE TO CALL OUR OFFICE WITH ANY PROBLEMS OR QUESTIONS ([phone])      

## 2021-05-30 ENCOUNTER — Ambulatory Visit: Payer: 59

## 2021-05-30 ENCOUNTER — Ambulatory Visit: Payer: 59 | Attending: Registered Nurse | Admitting: Occupational Therapy

## 2021-05-30 ENCOUNTER — Ambulatory Visit (INDEPENDENT_AMBULATORY_CARE_PROVIDER_SITE_OTHER): Payer: 59

## 2021-05-30 ENCOUNTER — Encounter: Payer: Self-pay | Admitting: Occupational Therapy

## 2021-05-30 VITALS — BP 137/62 | HR 71

## 2021-05-30 DIAGNOSIS — R2681 Unsteadiness on feet: Secondary | ICD-10-CM

## 2021-05-30 DIAGNOSIS — R41844 Frontal lobe and executive function deficit: Secondary | ICD-10-CM | POA: Diagnosis present

## 2021-05-30 DIAGNOSIS — M6281 Muscle weakness (generalized): Secondary | ICD-10-CM

## 2021-05-30 DIAGNOSIS — R262 Difficulty in walking, not elsewhere classified: Secondary | ICD-10-CM | POA: Insufficient documentation

## 2021-05-30 DIAGNOSIS — R41841 Cognitive communication deficit: Secondary | ICD-10-CM

## 2021-05-30 DIAGNOSIS — R41840 Attention and concentration deficit: Secondary | ICD-10-CM

## 2021-05-30 DIAGNOSIS — R41842 Visuospatial deficit: Secondary | ICD-10-CM | POA: Diagnosis present

## 2021-05-30 DIAGNOSIS — I429 Cardiomyopathy, unspecified: Secondary | ICD-10-CM

## 2021-05-30 DIAGNOSIS — I69318 Other symptoms and signs involving cognitive functions following cerebral infarction: Secondary | ICD-10-CM
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
## 2021-05-31 ENCOUNTER — Inpatient Hospital Stay: Payer: 59

## 2021-05-31 ENCOUNTER — Inpatient Hospital Stay: Payer: 59 | Attending: Physician Assistant | Admitting: Physician Assistant

## 2021-05-31 ENCOUNTER — Other Ambulatory Visit: Payer: Self-pay

## 2021-05-31 ENCOUNTER — Encounter: Payer: Self-pay | Admitting: Physician Assistant

## 2021-05-31 VITALS — BP 144/63 | HR 18 | Temp 97.6°F | Resp 18 | Ht 70.0 in | Wt 163.0 lb

## 2021-05-31 DIAGNOSIS — Z87891 Personal history of nicotine dependence: Secondary | ICD-10-CM | POA: Diagnosis not present

## 2021-05-31 DIAGNOSIS — D75839 Thrombocytosis, unspecified: Secondary | ICD-10-CM

## 2021-05-31 DIAGNOSIS — Z79899 Other long term (current) drug therapy: Secondary | ICD-10-CM

## 2021-05-31 DIAGNOSIS — I129 Hypertensive chronic kidney disease with stage 1 through stage 4 chronic kidney disease, or unspecified chronic kidney disease: Secondary | ICD-10-CM

## 2021-05-31 DIAGNOSIS — N189 Chronic kidney disease, unspecified: Secondary | ICD-10-CM | POA: Diagnosis not present

## 2021-05-31 LAB — CBC WITH DIFFERENTIAL (CANCER CENTER ONLY)
Abs Immature Granulocytes: 0.02 10*3/uL (ref 0.00–0.07)
Basophils Absolute: 0.1 10*3/uL (ref 0.0–0.1)
Basophils Relative: 1 %
Eosinophils Absolute: 0.3 10*3/uL (ref 0.0–0.5)
Eosinophils Relative: 3 %
HCT: 47.3 % (ref 39.0–52.0)
Hemoglobin: 15.6 g/dL (ref 13.0–17.0)
Immature Granulocytes: 0 %
Lymphocytes Relative: 13 %
Lymphs Abs: 1 10*3/uL (ref 0.7–4.0)
MCH: 28.4 pg (ref 26.0–34.0)
MCHC: 33 g/dL (ref 30.0–36.0)
MCV: 86.2 fL (ref 80.0–100.0)
Monocytes Absolute: 0.5 10*3/uL (ref 0.1–1.0)
Monocytes Relative: 6 %
Neutro Abs: 5.6 10*3/uL (ref 1.7–7.7)
Neutrophils Relative %: 77 %
Platelet Count: 592 10*3/uL — ABNORMAL HIGH (ref 150–400)
RBC: 5.49 MIL/uL (ref 4.22–5.81)
RDW: 13.7 % (ref 11.5–15.5)
WBC Count: 7.4 10*3/uL (ref 4.0–10.5)
nRBC: 0 % (ref 0.0–0.2)

## 2021-05-31 LAB — CMP (CANCER CENTER ONLY)
ALT: 23 U/L (ref 0–44)
AST: 26 U/L (ref 15–41)
Albumin: 4 g/dL (ref 3.5–5.0)
Alkaline Phosphatase: 96 U/L (ref 38–126)
Anion gap: 6 (ref 5–15)
BUN: 33 mg/dL — ABNORMAL HIGH (ref 8–23)
CO2: 27 mmol/L (ref 22–32)
Calcium: 9.8 mg/dL (ref 8.9–10.3)
Chloride: 110 mmol/L (ref 98–111)
Creatinine: 2.09 mg/dL — ABNORMAL HIGH (ref 0.61–1.24)
GFR, Estimated: 33 mL/min — ABNORMAL LOW (ref >60)
Glucose, Bld: 93 mg/dL (ref 70–99)
Potassium: 4.6 mmol/L (ref 3.5–5.1)
Sodium: 143 mmol/L (ref 135–145)
Total Bilirubin: 0.4 mg/dL (ref 0.3–1.2)
Total Protein: 7.4 g/dL (ref 6.5–8.1)

## 2021-05-31 LAB — IRON AND IRON BINDING CAPACITY (CC-WL,HP ONLY)
Iron: 61 ug/dL (ref 45–182)
Saturation Ratios: 15 % — ABNORMAL LOW (ref 17.9–39.5)
TIBC: 412 ug/dL (ref 250–450)
UIBC: 351 ug/dL (ref 117–376)

## 2021-05-31 LAB — C-REACTIVE PROTEIN: CRP: 0.6 mg/dL (ref <1.0)

## 2021-05-31 LAB — SEDIMENTATION RATE: Sed Rate: 2 mm/hr (ref 0–16)

## 2021-05-31 LAB — FERRITIN: Ferritin: 19 ng/mL — ABNORMAL LOW (ref 24–336)

## 2021-06-01 ENCOUNTER — Other Ambulatory Visit: Payer: Self-pay | Admitting: Physician Assistant

## 2021-06-01 LAB — CUP PACEART REMOTE DEVICE CHECK
Date Time Interrogation Session: 20230525130737
Implantable Pulse Generator Implant Date: 20230421

## 2021-06-03 ENCOUNTER — Encounter: Payer: Self-pay | Admitting: Registered Nurse

## 2021-06-03 DIAGNOSIS — D75839 Thrombocytosis, unspecified: Secondary | ICD-10-CM | POA: Insufficient documentation

## 2021-06-03 NOTE — Progress Notes (Unsigned)
[place] [phone]   Fax:([phone] [place] NOTE  Patient Care Team: Maximiano Coss, NP as PCP - General (Adult Health Nurse Practitioner) Buford Dresser, MD as PCP - Cardiology (Cardiology) Delorse Limber (Family Medicine)  CHIEF COMPLAINTS/PURPOSE OF CONSULTATION:  Thrombocytosis  HISTORY OF PRESENTING ILLNESS:  Gary [name] 75 y.o. male with medical history significant for traumatic brain injury, chronic kidney disease, GERD, hypertension and stroke.  He is accompanied by his wife.  On review of the previous records, there is evidence of thrombocytosis as far back as October 2022.  Most recent labs from 05/13/2021 revealed a platelet count of 586 K/uL.   On exam today, Gary [name] reports that he is easily fatigued but can complete most of his daily activities.  His wife adds that ever since he had his traumatic brain injury, he has struggled with double vision, short-term memory loss, balance difficulty and decreased cognition.  This has worsened since his stroke from April 2023.  He has a fair appetite but denies any recent weight loss.  He denies nausea, vomiting or abdominal pain.  His bowel habits are unchanged without any recurrent episodes of diarrhea or constipation.  He denies easy bruising or signs of active bleeding.  Patient denies fevers, chills, night sweats, shortness of breath, chest pain or cough.  He has no other complaints.  Rest of the 10 point ROS is below.  MEDICAL HISTORY:  Past Medical History:  Diagnosis Date   ADHD    Allergy    CKD (chronic kidney disease)    GERD (gastroesophageal reflux disease)    History of chickenpox    History of diverticulitis 2007   History of kidney stones    HTN (hypertension)    Reflux    Renal disorder    kidney stones   TBI (traumatic brain injury) ([place]) 08/11/2019   TBI (traumatic brain injury) ([place]) 08/11/2019   Trauma     SURGICAL HISTORY: Past  Surgical History:  Procedure Laterality Date   LOOP RECORDER INSERTION N/A 04/26/2021   Procedure: LOOP RECORDER INSERTION;  Surgeon: Sanda Klein, MD;  Location: [facility];  Service: Cardiovascular;  Laterality: N/A;   MINOR REMOVAL OF MANDIBULAR HARDWARE N/A 12/15/2019   Procedure: REMOVAL OF RIGHT LATERAL ORBITAL MINIPLATE;  Surgeon: [name], DO;  Location: [facility];  Service: Plastics;  Laterality: N/A;   ORIF MANDIBULAR FRACTURE Bilateral 07/27/2019   Procedure: OPEN REDUCTION INTERNAL FIXATION (ORIF) OF COMPLEX ZYGOMATIC FRACTURE;  Surgeon: [name], DO;  Location: [facility];  Service: Plastics;  Laterality: Bilateral;  2 hours, please    SOCIAL HISTORY: Social History   Socioeconomic History   Marital status: Married    Spouse name: Osmani Kersten   Number of children: Not on file   Years of education: Not on file   Highest education level: Not on file  Occupational History   Occupation: Warden/ranger    Comment: [employer]  Tobacco Use   Smoking status: Former    Types: Cigarettes   Smokeless tobacco: Never  Scientific laboratory technician Use: Never used  Substance and Sexual Activity   Alcohol use: Not Currently   Drug use: Never   Sexual activity: Yes  Other Topics Concern   Not on file  Social History Narrative   ** Merged History Encounter **       Social Determinants of Health   Financial Resource Strain: Not on file  Food Insecurity:  Not on file  Transportation Needs: Not on file  Physical Activity: Not on file  Stress: Not on file  Social Connections: Not on file  Intimate Partner Violence: Not on file    FAMILY HISTORY: Family History  Problem Relation Age of Onset   Heart disease Mother    Hypertension Mother    Stroke Mother    Polycythemia Father    Polycythemia Brother    Leukemia Brother     ALLERGIES:  is allergic to levaquin [levofloxacin] and shellfish allergy.  MEDICATIONS:  Current Outpatient Medications   Medication Sig Dispense Refill   acetaminophen (TYLENOL) 325 MG tablet Take 1-2 tablets (325-650 mg total) by mouth every 4 (four) hours as needed for mild pain.     amLODipine (NORVASC) 2.5 MG tablet Take 1 tablet (2.5 mg total) by mouth daily. 30 tablet 3   amphetamine-dextroamphetamine (ADDERALL XR) 20 MG 24 hr capsule Take 1 capsule (20 mg total) by mouth daily. 30 capsule 0   amphetamine-dextroamphetamine (ADDERALL) 20 MG tablet Take 1 tablet (20 mg total) by mouth daily. 30 tablet 0   aspirin 81 MG EC tablet Take 1 tablet (81 mg total) by mouth daily. Swallow whole. 30 tablet 0   azelastine (ASTELIN) 0.1 % nasal spray Place 1 spray into both nostrils 2 (two) times daily. Use in each nostril as directed 30 mL 12   b complex vitamins capsule Take 1 capsule by mouth daily.     buPROPion (WELLBUTRIN XL) 300 MG 24 hr tablet TAKE 1 TABLET(300 MG) BY MOUTH DAILY 30 tablet 4   carvedilol (COREG) 3.125 MG tablet Take 1 tablet (3.125 mg total) by mouth 2 (two) times daily with a meal. 60 tablet 3   cetirizine (ZYRTEC) 10 MG tablet Take 1 tablet (10 mg total) by mouth daily. 30 tablet 11   diclofenac Sodium (VOLTAREN) 1 % GEL Apply 2 g topically 4 (four) times daily as needed (foot pain).     finasteride (PROSCAR) 5 MG tablet Take 1 tablet (5 mg total) by mouth daily. 30 tablet 0   levETIRAcetam (KEPPRA) 500 MG tablet TAKE 1 TABLET(500 MG) BY MOUTH TWICE DAILY (Patient not taking: Reported on 05/29/2021) 30 tablet 6   Multiple Vitamin (MULTIVITAMIN WITH MINERALS) TABS tablet Take 1 tablet by mouth daily.     pantoprazole (PROTONIX) 40 MG tablet Take 1 tablet (40 mg total) by mouth daily. 30 tablet 0   rosuvastatin (CRESTOR) 20 MG tablet Take 1 tablet (20 mg total) by mouth daily. 30 tablet 3   traMADol (ULTRAM) 50 MG tablet Take 0.5-1 tablets (25-50 mg total) by mouth daily as needed. 30 tablet 2   No current facility-administered medications for this visit.    REVIEW OF SYSTEMS:   Constitutional:  ( - ) fevers, ( - )  chills , ( - ) night sweats Eyes: ( - ) blurriness of vision, ( + ) double vision, ( - ) watery eyes Ears, nose, mouth, throat, and face: ( - ) mucositis, ( - ) sore throat Respiratory: ( - ) cough, ( - ) dyspnea, ( - ) wheezes Cardiovascular: ( - ) palpitation, ( - ) chest discomfort, ( - ) lower extremity swelling Gastrointestinal:  ( - ) nausea, ( - ) heartburn, ( - ) change in bowel habits Skin: ( - ) abnormal skin rashes Lymphatics: ( - ) new lymphadenopathy, ( - ) easy bruising Neurological: ( - ) numbness, ( - ) tingling, ( - ) new weaknesses Behavioral/Psych: ( - )  mood change, ( - ) new changes  All other systems were reviewed with the patient and are negative.  PHYSICAL EXAMINATION: ECOG PERFORMANCE STATUS: 1 - Symptomatic but completely ambulatory  Vitals:   05/31/21 1115  BP: (!) 144/63  Pulse: (!) 18  Resp: 18  Temp: 97.6 F (36.4 C)  SpO2: 100%   Filed Weights   05/31/21 1115  Weight: 163 lb (73.9 kg)    GENERAL: well appearing male in NAD  SKIN: skin color, texture, turgor are normal, no rashes or significant lesions EYES: conjunctiva are pink and non-injected, sclera clear OROPHARYNX: no exudate, no erythema; lips, buccal mucosa, and tongue normal  NECK: supple, non-tender LYMPH:  no palpable lymphadenopathy in the cervical or supraclavicular lymph nodes.  LUNGS: clear to auscultation and percussion with normal breathing effort HEART: regular rate & rhythm and no murmurs and no lower extremity edema ABDOMEN: soft, non-tender, non-distended, normal bowel sounds Musculoskeletal: no cyanosis of digits and no clubbing  PSYCH: alert & oriented x 3, fluent speech NEURO: no focal motor/sensory deficits  LABORATORY DATA:  I have reviewed the data as listed    Latest Ref Rng & Units 05/31/2021    1:21 PM 05/13/2021    4:50 PM 05/02/2021    8:17 AM  CBC  WBC 4.0 - 10.5 K/uL 7.4   7.6   6.8    Hemoglobin 13.0 - 17.0 g/dL 15.6   15.4   15.0     Hematocrit 39.0 - 52.0 % 47.3   44.3   41.0    Platelets 150 - 400 K/uL 592   586   579         Latest Ref Rng & Units 05/31/2021    1:21 PM 05/13/2021   11:45 AM 05/03/2021    5:02 AM  CMP  Glucose 70 - 99 mg/dL 93   78   86    BUN 8 - 23 mg/dL 33   22   23    Creatinine 0.61 - 1.24 mg/dL 2.09   1.99   2.09    Sodium 135 - 145 mmol/L 143   143   140    Potassium 3.5 - 5.1 mmol/L 4.6   5.1   3.8    Chloride 98 - 111 mmol/L 110   106   108    CO2 22 - 32 mmol/L $RemoveB'27   29   25    'ocRRwumW$ Calcium 8.9 - 10.3 mg/dL 9.8   9.3   9.2    Total Protein 6.5 - 8.1 g/dL 7.4   6.9     Total Bilirubin 0.3 - 1.2 mg/dL 0.4   0.5     Alkaline Phos 38 - 126 U/L 96   86     AST 15 - 41 U/L 26   24     ALT 0 - 44 U/L 23   15     \ RADIOGRAPHIC STUDIES: I have personally reviewed the radiological images as listed and agreed with the findings in the report. CUP PACEART REMOTE DEVICE CHECK  Result Date: 06/01/2021 ILR summary report received. Battery status OK. Normal device function. No new symptom, tachy, brady, or pause episodes. No new AF episodes. AF burden is 0% of the time.  Monthly summary reports and ROV/PRN [name] Breach, RN, CCDS, [employer]   ASSESSMENT & PLAN Amman Bartel is a 76 y.o. male who presents to the clinic for evaluation for thrombocytosis.   There are two types of  thrombocytosis, essential thrombocytosis and secondary thrombocytosis. Essential thrombocytosis is overproduction of platelets due to a driver mutation. The most common mutation is the JAK2 V617F (50% of cases), but other causes include CALR, MPL, and mutations of unclear significance on NGS. Essential thrombocytosis is a myeloproliferative neoplasm which may require cytoreductive therapy do decrease risk of thrombosis. Secondary thrombocytosis can be caused by low iron levels, increased inflammation,  or splenectomy.  Our workup will focus on determining the underlying cause of this patient's thrombocytosis.    #Thrombocytosis --workup to include CBC, CMP, ESR/CRP, and iron panel/ferritin --patient has no history of splenectomy    --will order MPN workup to include JAK2 with reflex and BCR/ABL FISH --RTC in 3 months or sooner if indicated by the above labs.    Orders Placed This Encounter  Procedures   CBC with Differential ([facility] Only)    Standing Status:   Future    Number of Occurrences:   1    Standing Expiration Date:   06/01/2022   CMP ([facility] only)    Standing Status:   Future    Number of Occurrences:   1    Standing Expiration Date:   06/01/2022   Sedimentation rate    Standing Status:   Future    Number of Occurrences:   1    Standing Expiration Date:   05/31/2022   C-reactive protein    Standing Status:   Future    Number of Occurrences:   1    Standing Expiration Date:   05/31/2022   Ferritin    Standing Status:   Future    Number of Occurrences:   1    Standing Expiration Date:   06/01/2022   Iron and Iron Binding Capacity ([facility] only)    Standing Status:   Future    Number of Occurrences:   1    Standing Expiration Date:   06/01/2022   JAK2 (INCLUDING V617F AND EXON 12), MPL,& CALR W/RFL MPN PANEL (NGS)    Standing Status:   Future    Number of Occurrences:   1    Standing Expiration Date:   05/31/2022   BCR ABL1 FISH ([facility])    Standing Status:   Future    Number of Occurrences:   1    Standing Expiration Date:   06/01/2022    All questions were answered. The patient knows to call the clinic with any problems, questions or concerns.  I have spent a total of 60 minutes minutes of face-to-face and non-face-to-face time, preparing to see the patient, obtaining and/or reviewing separately obtained history, performing a medically appropriate examination, counseling and educating the patient, ordering tests, documenting clinical information in the electronic health record, and care coordination.   Gary Query, Gary [name] Department of  Hematology/Oncology [place] at [facility] Phone: [phone]  Patient was seen with Dr. Lorenso Courier  I have read the above note and personally examined the patient. I agree with the assessment and plan as noted above.  Briefly Gary [name] is a 76 year old male who presents for evaluation of thrombocytosis.  At this time there is no clear etiology for this finding.  We will order full work-up to include iron panel, inflammatory markers, as well as MPN work-up with JAK2 panel as well as BCR/ABL FISH.  Interestingly the patient has a strong family history of polycythemia vera.  We will determine the work-up moving forward pending the results of the above studies.  Ledell Peoples, MD Department of Hematology/Oncology [place] at [facility] Phone: [phone] Pager: [phone] Email: [name]

## 2021-06-04 ENCOUNTER — Telehealth: Payer: Self-pay | Admitting: Physician Assistant

## 2021-06-04 NOTE — Telephone Encounter (Signed)
Per 5/26 los called and spoke to pt wife about appointment  pt wife confirmed appointment

## 2021-06-05 ENCOUNTER — Encounter: Payer: Self-pay | Admitting: Physical Therapy

## 2021-06-05 ENCOUNTER — Ambulatory Visit: Payer: 59 | Admitting: Physical Therapy

## 2021-06-05 ENCOUNTER — Ambulatory Visit: Payer: 59

## 2021-06-05 VITALS — BP 148/83 | HR 68

## 2021-06-05 DIAGNOSIS — R41841 Cognitive communication deficit: Secondary | ICD-10-CM

## 2021-06-05 DIAGNOSIS — R2681 Unsteadiness on feet: Secondary | ICD-10-CM

## 2021-06-05 DIAGNOSIS — R262 Difficulty in walking, not elsewhere classified: Secondary | ICD-10-CM

## 2021-06-05 DIAGNOSIS — I69318 Other symptoms and signs involving cognitive functions following cerebral infarction: Secondary | ICD-10-CM | POA: Diagnosis not present

## 2021-06-05 DIAGNOSIS — M6281 Muscle weakness (generalized): Secondary | ICD-10-CM

## 2021-06-05 NOTE — Patient Instructions (Signed)
Access Code: [email or web address] URL: [email or web address] Date: 06/05/2021 Prepared by: Elease Etienne  Exercises - Standing Tandem Balance with Counter Support  - 1 x daily - 5 x weekly - 1 sets - 4 reps - 30 sec hold - Standing Near Stance in Corner with Eyes Closed  - 1 x daily - 5 x weekly - 1 sets - 4 reps - 30 seconds hold - Romberg Stance Eyes Closed on Foam Pad  - 1 x daily - 5 x weekly - 4 reps - 30 seconds hold - Tandem Walking with Counter Support  - 1 x daily - 6 x weekly - 3 sets - 10 reps - Backward Tandem Walking with Counter Support  - 1 x daily - 6 x weekly - 3 sets - 10 reps - Forward Backward Monster Walk with Band at [employer] and Counter Support  - 1 x daily - 5 x weekly - 3 sets - 10 reps - Backwards Walking  - 1 x daily - 6 x weekly - 3 sets - 10 reps
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
## 2021-06-05 NOTE — Patient Instructions (Signed)
Make sure you are answering the right question  Read the question several times  Try to rephrase it if you don't understand  Ask for clarification if you don't understand Keep it simple Try working it out on a separate sheet of paper or off to the side    Memory and attention go together! If something doesn't have 100% of your focus or if you lose focus, it will be that much harder to remember
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
## 2021-06-07 ENCOUNTER — Ambulatory Visit (INDEPENDENT_AMBULATORY_CARE_PROVIDER_SITE_OTHER): Payer: 59

## 2021-06-07 ENCOUNTER — Telehealth: Payer: Self-pay | Admitting: Physician Assistant

## 2021-06-07 ENCOUNTER — Encounter: Payer: Self-pay | Admitting: Physician Assistant

## 2021-06-07 DIAGNOSIS — I429 Cardiomyopathy, unspecified: Secondary | ICD-10-CM | POA: Diagnosis not present

## 2021-06-07 DIAGNOSIS — D75839 Thrombocytosis, unspecified: Secondary | ICD-10-CM

## 2021-06-07 DIAGNOSIS — Z1589 Genetic susceptibility to other disease: Secondary | ICD-10-CM

## 2021-06-07 LAB — ECHOCARDIOGRAM COMPLETE
AR max vel: 2.42 cm2
AV Area VTI: 2.18 cm2
AV Area mean vel: 2.13 cm2
AV Mean grad: 2 mmHg
AV Peak grad: 4 mmHg
AV Vena cont: 0.7 cm
Ao pk vel: 1 m/s
Area-P 1/2: 4.17 cm2
Calc EF: 30.1 %
MV M vel: 5.05 m/s
MV Peak grad: 102 mmHg
P 1/2 time: 522 msec
S' Lateral: 5 cm
Single Plane A2C EF: 25.8 %
Single Plane A4C EF: 35.6 %

## 2021-06-07 LAB — BCR ABL1 FISH (GENPATH)

## 2021-06-07 LAB — JAK2 (INCLUDING V617F AND EXON 12), MPL,& CALR W/RFL MPN PANEL (NGS)

## 2021-06-07 NOTE — Telephone Encounter (Signed)
Called patient's wife, Mrs. Donahoe to review the lab results from 05/31/2021.  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  Recommend to proceed with bone marrow biopsy to confirm myeloproliferative neoplasm.  Once bone marrow biopsy is scheduled, we will schedule follow-up visit with Dr. Lorenso Courier to review results and discuss treatment options.  Mrs. Kilgore expressed understanding and satisfaction with the plan provided.

## 2021-06-10 ENCOUNTER — Telehealth: Payer: Self-pay | Admitting: Hematology and Oncology

## 2021-06-10 ENCOUNTER — Other Ambulatory Visit: Payer: Self-pay | Admitting: Physician Assistant

## 2021-06-10 NOTE — Telephone Encounter (Signed)
Per 6/5 secure chat w provider, pt wife has been called and confirmed

## 2021-06-11 ENCOUNTER — Ambulatory Visit: Payer: 59

## 2021-06-11 ENCOUNTER — Encounter: Payer: Self-pay | Admitting: Occupational Therapy

## 2021-06-11 ENCOUNTER — Ambulatory Visit: Payer: 59 | Admitting: Occupational Therapy

## 2021-06-11 ENCOUNTER — Ambulatory Visit: Payer: 59 | Attending: Registered Nurse

## 2021-06-11 VITALS — BP 141/67 | HR 68

## 2021-06-11 DIAGNOSIS — R41840 Attention and concentration deficit: Secondary | ICD-10-CM | POA: Insufficient documentation

## 2021-06-11 DIAGNOSIS — M6281 Muscle weakness (generalized): Secondary | ICD-10-CM | POA: Diagnosis present

## 2021-06-11 DIAGNOSIS — R41842 Visuospatial deficit: Secondary | ICD-10-CM

## 2021-06-11 DIAGNOSIS — R41844 Frontal lobe and executive function deficit: Secondary | ICD-10-CM | POA: Insufficient documentation

## 2021-06-11 DIAGNOSIS — I69318 Other symptoms and signs involving cognitive functions following cerebral infarction: Secondary | ICD-10-CM | POA: Diagnosis present

## 2021-06-11 DIAGNOSIS — R2681 Unsteadiness on feet: Secondary | ICD-10-CM

## 2021-06-11 DIAGNOSIS — R262 Difficulty in walking, not elsewhere classified: Secondary | ICD-10-CM

## 2021-06-11 DIAGNOSIS — R41841 Cognitive communication deficit: Secondary | ICD-10-CM | POA: Diagnosis present

## 2021-06-11 NOTE — Progress Notes (Signed)
[facility] Summary Report / Loop Recorder 
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
## 2021-06-11 NOTE — Patient Instructions (Addendum)
    Do you have. Key? Phone? Sunglasses? Cane?    If you don't recall the date, DOW, and time, you can always look at your phone screen to find this information.  You brought up wearing a watch and carrying a notebook. Please bring these to next speech therapy session.  You can use the "Reminders" app in your phone to set any reminders you may need. Practice using it at home. If you don't recall how to do this, we will review again next speech therapy session

## 2021-06-13 ENCOUNTER — Ambulatory Visit: Payer: 59 | Admitting: Speech Pathology

## 2021-06-13 DIAGNOSIS — R41841 Cognitive communication deficit: Secondary | ICD-10-CM | POA: Diagnosis not present
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
## 2021-06-13 NOTE — Patient Instructions (Signed)
Balancing Checkbook  Use clean sheet for balancing new transactions  Add or subtract transaction by transaction  Double check as you go, do a "sanity" check-- does this make sense?   Complete as you spend or receive money to reduce number of transactions you are tracking  Use a calculator   Check against online bank statement

## 2021-06-17 ENCOUNTER — Ambulatory Visit (INDEPENDENT_AMBULATORY_CARE_PROVIDER_SITE_OTHER): Payer: 59 | Admitting: Cardiology

## 2021-06-17 ENCOUNTER — Encounter (HOSPITAL_BASED_OUTPATIENT_CLINIC_OR_DEPARTMENT_OTHER): Payer: Self-pay | Admitting: Cardiology

## 2021-06-17 VITALS — BP 158/70 | HR 62 | Ht 70.0 in | Wt 161.8 lb

## 2021-06-17 DIAGNOSIS — I429 Cardiomyopathy, unspecified: Secondary | ICD-10-CM

## 2021-06-17 DIAGNOSIS — I5189 Other ill-defined heart diseases: Secondary | ICD-10-CM | POA: Diagnosis not present

## 2021-06-17 DIAGNOSIS — Z8673 Personal history of transient ischemic attack (TIA), and cerebral infarction without residual deficits: Secondary | ICD-10-CM

## 2021-06-17 DIAGNOSIS — N1832 Chronic kidney disease, stage 3b: Secondary | ICD-10-CM | POA: Diagnosis not present

## 2021-06-17 DIAGNOSIS — Z712 Person consulting for explanation of examination or test findings: Secondary | ICD-10-CM

## 2021-06-17 DIAGNOSIS — Z87820 Personal history of traumatic brain injury: Secondary | ICD-10-CM

## 2021-06-17 DIAGNOSIS — I63512 Cerebral infarction due to unspecified occlusion or stenosis of left middle cerebral artery: Secondary | ICD-10-CM

## 2021-06-17 DIAGNOSIS — I10 Essential (primary) hypertension: Secondary | ICD-10-CM

## 2021-06-17 MED ORDER — ISOSORBIDE MONONITRATE ER 30 MG PO TB24: 30.0000 mg | ORAL_TABLET | Freq: Every day | ORAL | 3 refills | Status: DC

## 2021-06-17 MED ORDER — HYDRALAZINE HCL 10 MG PO TABS: 10.0000 mg | ORAL_TABLET | Freq: Three times a day (TID) | ORAL | 11 refills | Status: DC
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
## 2021-06-17 NOTE — Patient Instructions (Signed)
  Follow-Up: At [facility], you and your health needs are our priority.  As part of our continuing mission to provide you with exceptional heart care, we have created designated Provider Care Teams.  These Care Teams include your primary Cardiologist (physician) and Advanced Practice Providers (APPs -  Physician Assistants and Nurse Practitioners) who all work together to provide you with the care you need, when you need it.  We recommend signing up for the patient portal called "MyChart".  Sign up information is provided on this After Visit Summary.  MyChart is used to connect with patients for Virtual Visits (Telemedicine).  Patients are able to view lab/test results, encounter notes, upcoming appointments, etc.  Non-urgent messages can be sent to your provider as well.   To learn more about what you can do with MyChart, go to [email or web address].    Your next appointment:   6 week(s)  The format for your next appointment:   In Person  Provider:   Buford Dresser, MD      Important Information About Sugar

## 2021-06-18 ENCOUNTER — Encounter: Payer: 59 | Admitting: Occupational Therapy

## 2021-06-18 ENCOUNTER — Ambulatory Visit (INDEPENDENT_AMBULATORY_CARE_PROVIDER_SITE_OTHER): Payer: 59 | Admitting: Adult Health

## 2021-06-18 ENCOUNTER — Ambulatory Visit: Payer: 59 | Admitting: Physical Therapy

## 2021-06-18 ENCOUNTER — Encounter: Payer: Self-pay | Admitting: Adult Health

## 2021-06-18 VITALS — BP 138/75 | HR 70 | Ht 70.0 in | Wt 162.0 lb

## 2021-06-18 DIAGNOSIS — Z09 Encounter for follow-up examination after completed treatment for conditions other than malignant neoplasm: Secondary | ICD-10-CM | POA: Diagnosis not present

## 2021-06-18 DIAGNOSIS — I639 Cerebral infarction, unspecified: Secondary | ICD-10-CM | POA: Diagnosis not present

## 2021-06-18 DIAGNOSIS — R569 Unspecified convulsions: Secondary | ICD-10-CM | POA: Diagnosis not present

## 2021-06-18 DIAGNOSIS — I63412 Cerebral infarction due to embolism of left middle cerebral artery: Secondary | ICD-10-CM | POA: Diagnosis not present

## 2021-06-18 MED ORDER — LEVETIRACETAM 500 MG PO TABS: 500.0000 mg | ORAL_TABLET | Freq: Two times a day (BID) | ORAL | 3 refills | Status: DC

## 2021-06-18 NOTE — Patient Instructions (Addendum)
Continue keppra '500mg'$  twice daily for seizure prevention   Continue working with therapies as scheduled   Continue aspirin 81 mg daily  and Crestor  for secondary stroke prevention  Loop reorder will continue to be monitored by cardiology - it has not shown atrial fibrillation thus far   Continue to follow up with PCP regarding cholesterol and blood pressure management  Maintain strict control of hypertension with blood pressure goal below 130/90 and cholesterol with LDL cholesterol (bad cholesterol) goal below 70 mg/dL.   Signs of a Stroke? Follow the BEFAST method:  Balance Watch for a sudden loss of balance, trouble with coordination or vertigo Eyes Is there a sudden loss of vision in one or both eyes? Or double vision?  Face: Ask the person to smile. Does one side of the face droop or is it numb?  Arms: Ask the person to raise both arms. Does one arm drift downward? Is there weakness or numbness of a leg? Speech: Ask the person to repeat a simple phrase. Does the speech sound slurred/strange? Is the person confused ? Time: If you observe any of these signs, call 911.     Followup in the future with me in 4 months or call earlier if needed       Thank you for coming to see [place] at [facility]. I hope we have been able to provide you high quality care today.  You may receive a patient satisfaction survey over the next few weeks. We would appreciate your feedback and comments so that we may continue to improve ourselves and the health of our patients.   Stroke Prevention Some medical conditions and behaviors can lead to a higher chance of having a stroke. You can help prevent a stroke by eating healthy, exercising, not smoking, and managing any medical conditions you have. Stroke is a leading cause of functional impairment. Primary prevention is particularly important because a majority of strokes are first-time events. Stroke changes the lives of not only those  who experience a stroke but also their family and other caregivers. How can this condition affect me? A stroke is a medical emergency and should be treated right away. A stroke can lead to brain damage and can sometimes be life-threatening. If a person gets medical treatment right away, there is a better chance of surviving and recovering from a stroke. What can increase my risk? The following medical conditions may increase your risk of a stroke: Cardiovascular disease. High blood pressure (hypertension). Diabetes. High cholesterol. Sickle cell disease. Blood clotting disorders (hypercoagulable state). Obesity. Sleep disorders (obstructive sleep apnea). Other risk factors include: Being older than age 74. Having a history of blood clots, stroke, or mini-stroke (transient ischemic attack, TIA). Genetic factors, such as race, ethnicity, or a family history of stroke. Smoking cigarettes or using other tobacco products. Taking birth control pills, especially if you also use tobacco. Heavy use of alcohol or drugs, especially cocaine and methamphetamine. Physical inactivity. What actions can I take to prevent this? Manage your health conditions High cholesterol levels. Eating a healthy diet is important for preventing high cholesterol. If cholesterol cannot be managed through diet alone, you may need to take medicines. Take any prescribed medicines to control your cholesterol as told by your health care provider. Hypertension. To reduce your risk of stroke, try to keep your blood pressure below 130/80. Eating a healthy diet and exercising regularly are important for controlling blood pressure. If these steps are not enough to manage your  blood pressure, you may need to take medicines. Take any prescribed medicines to control hypertension as told by your health care provider. Ask your health care provider if you should monitor your blood pressure at home. Have your blood pressure checked  every year, even if your blood pressure is normal. Blood pressure increases with age and some medical conditions. Diabetes. Eating a healthy diet and exercising regularly are important parts of managing your blood sugar (glucose). If your blood sugar cannot be managed through diet and exercise, you may need to take medicines. Take any prescribed medicines to control your diabetes as told by your health care provider. Get evaluated for obstructive sleep apnea. Talk to your health care provider about getting a sleep evaluation if you snore a lot or have excessive sleepiness. Make sure that any other medical conditions you have, such as atrial fibrillation or atherosclerosis, are managed. Nutrition Follow instructions from your health care provider about what to eat or drink to help manage your health condition. These instructions may include: Reducing your daily calorie intake. Limiting how much salt (sodium) you use to 1,500 milligrams (mg) each day. Using only healthy fats for cooking, such as olive oil, canola oil, or sunflower oil. Eating healthy foods. You can do this by: Choosing foods that are high in fiber, such as whole grains, and fresh fruits and vegetables. Eating at least 5 servings of fruits and vegetables a day. Try to fill one-half of your plate with fruits and vegetables at each meal. Choosing lean protein foods, such as lean cuts of meat, poultry without skin, fish, tofu, beans, and nuts. Eating low-fat dairy products. Avoiding foods that are high in sodium. This can help lower blood pressure. Avoiding foods that have saturated fat, trans fat, and cholesterol. This can help prevent high cholesterol. Avoiding processed and prepared foods. Counting your daily carbohydrate intake.  Lifestyle If you drink alcohol: Limit how much you have to: 0-1 drink a day for women who are not pregnant. 0-2 drinks a day for men. Know how much alcohol is in your drink. In the [place] one drink  equals one 12 oz bottle of beer (312m), one 5 oz glass of wine (1449m, or one 1 oz glass of hard liquor (4431m Do not use any products that contain nicotine or tobacco. These products include cigarettes, chewing tobacco, and vaping devices, such as e-cigarettes. If you need help quitting, ask your health care provider. Avoid secondhand smoke. Do not use drugs. Activity  Try to stay at a healthy weight. Get at least 30 minutes of exercise on most days, such as: Fast walking. Biking. Swimming. Medicines Take over-the-counter and prescription medicines only as told by your health care provider. Aspirin or blood thinners (antiplatelets or anticoagulants) may be recommended to reduce your risk of forming blood clots that can lead to stroke. Avoid taking birth control pills. Talk to your health care provider about the risks of taking birth control pills if: You are over 35 16ars old. You smoke. You get very bad headaches. You have had a blood clot. Where to find more information American Stroke Association: www.strokeassociation.org Get help right away if: You or a loved one has any symptoms of a stroke. "BE FAST" is an easy way to remember the main warning signs of a stroke: B - Balance. Signs are dizziness, sudden trouble walking, or loss of balance. E - Eyes. Signs are trouble seeing or a sudden change in vision. F - Face. Signs are sudden weakness or numbness  of the face, or the face or eyelid drooping on one side. A - Arms. Signs are weakness or numbness in an arm. This happens suddenly and usually on one side of the body. S - Speech. Signs are sudden trouble speaking, slurred speech, or trouble understanding what people say. T - Time. Time to call emergency services. Write down what time symptoms started. You or a loved one has other signs of a stroke, such as: A sudden, severe headache with no known cause. Nausea or vomiting. Seizure. These symptoms may represent a serious  problem that is an emergency. Do not wait to see if the symptoms will go away. Get medical help right away. Call your local emergency services (911 in the [place] Do not drive yourself to the hospital. Summary You can help to prevent a stroke by eating healthy, exercising, not smoking, limiting alcohol intake, and managing any medical conditions you may have. Do not use any products that contain nicotine or tobacco. These include cigarettes, chewing tobacco, and vaping devices, such as e-cigarettes. If you need help quitting, ask your health care provider. Remember "BE FAST" for warning signs of a stroke. Get help right away if you or a loved one has any of these signs. This information is not intended to replace advice given to you by your health care provider. Make sure you discuss any questions you have with your health care provider. Document Revised: 07/25/2019 Document Reviewed: 07/25/2019 Elsevier Patient Education  [place].

## 2021-06-19 ENCOUNTER — Encounter: Payer: Self-pay | Admitting: Speech Pathology

## 2021-06-19 ENCOUNTER — Ambulatory Visit: Payer: 59 | Admitting: Speech Pathology

## 2021-06-19 DIAGNOSIS — R41841 Cognitive communication deficit: Secondary | ICD-10-CM

## 2021-06-19 NOTE — Progress Notes (Signed)
I agree with the above plan 
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
## 2021-06-20 ENCOUNTER — Encounter: Payer: Self-pay | Admitting: Registered Nurse

## 2021-06-21 ENCOUNTER — Ambulatory Visit: Payer: 59

## 2021-06-21 DIAGNOSIS — M6281 Muscle weakness (generalized): Secondary | ICD-10-CM

## 2021-06-21 DIAGNOSIS — R41841 Cognitive communication deficit: Secondary | ICD-10-CM | POA: Diagnosis not present

## 2021-06-21 DIAGNOSIS — R262 Difficulty in walking, not elsewhere classified: Secondary | ICD-10-CM

## 2021-06-21 DIAGNOSIS — R2681 Unsteadiness on feet: Secondary | ICD-10-CM
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
## 2021-06-21 NOTE — Patient Instructions (Addendum)
Friday To-do List:  Update Calendar at home  Set up your "Alexa" to remind you to drink water every 2 water
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
## 2021-06-25 ENCOUNTER — Ambulatory Visit: Payer: 59

## 2021-06-25 VITALS — BP 112/59 | HR 72

## 2021-06-25 DIAGNOSIS — R41841 Cognitive communication deficit: Secondary | ICD-10-CM | POA: Diagnosis not present

## 2021-06-25 DIAGNOSIS — R2681 Unsteadiness on feet: Secondary | ICD-10-CM

## 2021-06-25 DIAGNOSIS — M6281 Muscle weakness (generalized): Secondary | ICD-10-CM

## 2021-06-25 DIAGNOSIS — R262 Difficulty in walking, not elsewhere classified: Secondary | ICD-10-CM
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
## 2021-06-25 NOTE — Patient Instructions (Addendum)
If you feel you might forget to do something, do it immediately   For your research into your past medical history, work systematically:  Attend to what notes you are opening in [place] (you want MD versus therapy notes) Your brain has to filter out what is important versus not (write down your main goal to help keep your focus on what you are searching for) Write notes as you go with specific information  Keep a checklist of what you want to accomplish (do one task at a time)   Daily Time Limit: 2 hours at max  If you getting more distracted as you work, you need to take a break  If you're tired, this becomes even more challenging  What is distracting you? Internal distractions - your own thoughts (stop when you feel off track - backtrack to what you were initially working on) External distractions - your environment

## 2021-06-27 ENCOUNTER — Ambulatory Visit: Payer: 59

## 2021-06-27 ENCOUNTER — Ambulatory Visit: Payer: 59 | Admitting: Physical Therapy

## 2021-07-02 ENCOUNTER — Ambulatory Visit: Payer: 59

## 2021-07-02 ENCOUNTER — Ambulatory Visit (INDEPENDENT_AMBULATORY_CARE_PROVIDER_SITE_OTHER): Payer: 59

## 2021-07-02 DIAGNOSIS — Z8673 Personal history of transient ischemic attack (TIA), and cerebral infarction without residual deficits: Secondary | ICD-10-CM | POA: Diagnosis not present

## 2021-07-02 DIAGNOSIS — R2681 Unsteadiness on feet: Secondary | ICD-10-CM

## 2021-07-02 DIAGNOSIS — R262 Difficulty in walking, not elsewhere classified: Secondary | ICD-10-CM

## 2021-07-02 DIAGNOSIS — R41841 Cognitive communication deficit: Secondary | ICD-10-CM

## 2021-07-02 DIAGNOSIS — M6281 Muscle weakness (generalized): Secondary | ICD-10-CM

## 2021-07-02 LAB — CUP PACEART REMOTE DEVICE CHECK
Date Time Interrogation Session: 20230627131016
Implantable Pulse Generator Implant Date: 20230421
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
## 2021-07-04 ENCOUNTER — Ambulatory Visit: Payer: 59 | Admitting: Occupational Therapy

## 2021-07-04 ENCOUNTER — Encounter: Payer: Self-pay | Admitting: Occupational Therapy

## 2021-07-04 ENCOUNTER — Ambulatory Visit: Payer: 59

## 2021-07-04 DIAGNOSIS — R41841 Cognitive communication deficit: Secondary | ICD-10-CM | POA: Diagnosis not present

## 2021-07-04 DIAGNOSIS — R41844 Frontal lobe and executive function deficit: Secondary | ICD-10-CM

## 2021-07-04 DIAGNOSIS — M6281 Muscle weakness (generalized): Secondary | ICD-10-CM

## 2021-07-04 DIAGNOSIS — R2681 Unsteadiness on feet: Secondary | ICD-10-CM

## 2021-07-04 DIAGNOSIS — R41840 Attention and concentration deficit: Secondary | ICD-10-CM

## 2021-07-04 DIAGNOSIS — R41842 Visuospatial deficit: Secondary | ICD-10-CM

## 2021-07-04 DIAGNOSIS — I69318 Other symptoms and signs involving cognitive functions following cerebral infarction: Secondary | ICD-10-CM

## 2021-07-04 NOTE — Patient Instructions (Addendum)
Let's try alarms to cue you to drink water  We set alarms for every two hours during the days You choose an alarm that would grab your attention - you may need to switch the alarm after awhile if you start to "tune it out"  Identify 2-3 realistic, personal goals to engage your brain a day - consider your safety and your limitations  The more you engage your brain a day, the more chances you have to practice attention and recalling
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
## 2021-07-04 NOTE — Patient Instructions (Signed)

## 2021-07-10 ENCOUNTER — Ambulatory Visit: Payer: 59 | Admitting: Occupational Therapy

## 2021-07-10 ENCOUNTER — Other Ambulatory Visit: Payer: Self-pay | Admitting: Student

## 2021-07-10 ENCOUNTER — Ambulatory Visit: Payer: 59 | Attending: Registered Nurse

## 2021-07-10 ENCOUNTER — Encounter: Payer: 59 | Admitting: Speech Pathology

## 2021-07-10 VITALS — BP 137/64 | HR 70

## 2021-07-10 DIAGNOSIS — F900 Attention-deficit hyperactivity disorder, predominantly inattentive type: Secondary | ICD-10-CM

## 2021-07-10 DIAGNOSIS — M6281 Muscle weakness (generalized): Secondary | ICD-10-CM | POA: Insufficient documentation

## 2021-07-10 DIAGNOSIS — R41844 Frontal lobe and executive function deficit: Secondary | ICD-10-CM | POA: Insufficient documentation

## 2021-07-10 DIAGNOSIS — R262 Difficulty in walking, not elsewhere classified: Secondary | ICD-10-CM | POA: Diagnosis present

## 2021-07-10 DIAGNOSIS — R2681 Unsteadiness on feet: Secondary | ICD-10-CM | POA: Diagnosis present

## 2021-07-10 DIAGNOSIS — R41840 Attention and concentration deficit: Secondary | ICD-10-CM | POA: Diagnosis present

## 2021-07-10 DIAGNOSIS — R41842 Visuospatial deficit: Secondary | ICD-10-CM | POA: Diagnosis present

## 2021-07-10 DIAGNOSIS — I69318 Other symptoms and signs involving cognitive functions following cerebral infarction: Secondary | ICD-10-CM | POA: Diagnosis present

## 2021-07-10 DIAGNOSIS — R41841 Cognitive communication deficit: Secondary | ICD-10-CM | POA: Insufficient documentation

## 2021-07-10 DIAGNOSIS — D75839 Thrombocytosis, unspecified: Secondary | ICD-10-CM
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
## 2021-07-11 ENCOUNTER — Ambulatory Visit (HOSPITAL_COMMUNITY): Payer: 59

## 2021-07-11 ENCOUNTER — Ambulatory Visit (HOSPITAL_COMMUNITY): Admission: RE | Admit: 2021-07-11 | Payer: 59 | Source: Ambulatory Visit

## 2021-07-12 ENCOUNTER — Ambulatory Visit: Payer: 59

## 2021-07-12 ENCOUNTER — Other Ambulatory Visit: Payer: Self-pay | Admitting: Registered Nurse

## 2021-07-12 ENCOUNTER — Ambulatory Visit: Payer: 59 | Admitting: Occupational Therapy

## 2021-07-12 ENCOUNTER — Encounter: Payer: Self-pay | Admitting: Occupational Therapy

## 2021-07-12 DIAGNOSIS — R2681 Unsteadiness on feet: Secondary | ICD-10-CM

## 2021-07-12 DIAGNOSIS — R944 Abnormal results of kidney function studies: Secondary | ICD-10-CM

## 2021-07-12 DIAGNOSIS — F900 Attention-deficit hyperactivity disorder, predominantly inattentive type: Secondary | ICD-10-CM

## 2021-07-12 DIAGNOSIS — R41841 Cognitive communication deficit: Secondary | ICD-10-CM

## 2021-07-12 DIAGNOSIS — R41840 Attention and concentration deficit: Secondary | ICD-10-CM

## 2021-07-12 DIAGNOSIS — R41842 Visuospatial deficit: Secondary | ICD-10-CM

## 2021-07-12 DIAGNOSIS — M6281 Muscle weakness (generalized): Secondary | ICD-10-CM

## 2021-07-12 DIAGNOSIS — R41844 Frontal lobe and executive function deficit: Secondary | ICD-10-CM

## 2021-07-12 NOTE — Patient Instructions (Addendum)
Engage in activities that work your brain at home. Think of ways to keep your mind and body active within your abilities!  Plan - Do - Review = Plan out the steps, do the task (with supervision as needed), and review how it went/double check for errors   [facility] = ways to decide how to prioritize tasks
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
## 2021-07-12 NOTE — Telephone Encounter (Signed)
Pt following up about referral for kidney concern?

## 2021-07-15 ENCOUNTER — Telehealth: Payer: Self-pay | Admitting: Hematology and Oncology
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
## 2021-07-15 NOTE — Telephone Encounter (Signed)
.  Called patient to schedule appointment per 7/10 inbasket, unable to leave vmail, calendar mailed

## 2021-07-16 ENCOUNTER — Ambulatory Visit: Payer: 59 | Admitting: Occupational Therapy

## 2021-07-16 ENCOUNTER — Ambulatory Visit: Payer: 59

## 2021-07-16 ENCOUNTER — Encounter: Payer: Self-pay | Admitting: Occupational Therapy

## 2021-07-16 DIAGNOSIS — R41844 Frontal lobe and executive function deficit: Secondary | ICD-10-CM

## 2021-07-16 DIAGNOSIS — R41840 Attention and concentration deficit: Secondary | ICD-10-CM

## 2021-07-16 DIAGNOSIS — R41841 Cognitive communication deficit: Secondary | ICD-10-CM

## 2021-07-16 DIAGNOSIS — R2681 Unsteadiness on feet: Secondary | ICD-10-CM | POA: Diagnosis not present

## 2021-07-16 DIAGNOSIS — R41842 Visuospatial deficit: Secondary | ICD-10-CM
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
## 2021-07-16 NOTE — Patient Instructions (Addendum)
Strategies to practice at home:  Make sure you face your listener and establish eye contact when talking Focus on one task at a time - check off when you have completed the task  Try taking notes during appointments to keep you engaged and help your memory  Try reading your novel at least 10-15 minutes per day - write down any words you don't know and look them up later  Be specific when you write something down so you know why you wrote it down

## 2021-07-17 ENCOUNTER — Encounter: Payer: Self-pay | Admitting: Psychology

## 2021-07-17 ENCOUNTER — Encounter: Payer: No Typology Code available for payment source | Attending: Psychology | Admitting: Psychology

## 2021-07-17 ENCOUNTER — Ambulatory Visit: Payer: 59 | Admitting: Hematology and Oncology

## 2021-07-17 ENCOUNTER — Other Ambulatory Visit: Payer: 59

## 2021-07-17 DIAGNOSIS — Z87820 Personal history of traumatic brain injury: Secondary | ICD-10-CM | POA: Insufficient documentation

## 2021-07-17 DIAGNOSIS — M47816 Spondylosis without myelopathy or radiculopathy, lumbar region: Secondary | ICD-10-CM | POA: Insufficient documentation

## 2021-07-17 DIAGNOSIS — G44329 Chronic post-traumatic headache, not intractable: Secondary | ICD-10-CM | POA: Diagnosis not present
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
## 2021-07-18 ENCOUNTER — Ambulatory Visit: Payer: 59 | Admitting: Occupational Therapy

## 2021-07-18 ENCOUNTER — Ambulatory Visit: Payer: 59 | Admitting: Physical Therapy

## 2021-07-18 ENCOUNTER — Ambulatory Visit: Payer: 59 | Admitting: Speech Pathology
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
## 2021-07-23 ENCOUNTER — Ambulatory Visit: Payer: 59 | Admitting: Occupational Therapy

## 2021-07-23 ENCOUNTER — Ambulatory Visit: Payer: 59

## 2021-07-23 ENCOUNTER — Encounter: Payer: Self-pay | Admitting: Physical Medicine & Rehabilitation

## 2021-07-23 ENCOUNTER — Encounter: Payer: Self-pay | Admitting: Occupational Therapy

## 2021-07-23 DIAGNOSIS — R41842 Visuospatial deficit: Secondary | ICD-10-CM

## 2021-07-23 DIAGNOSIS — R41844 Frontal lobe and executive function deficit: Secondary | ICD-10-CM

## 2021-07-23 DIAGNOSIS — R41841 Cognitive communication deficit: Secondary | ICD-10-CM

## 2021-07-23 DIAGNOSIS — R2681 Unsteadiness on feet: Secondary | ICD-10-CM

## 2021-07-23 DIAGNOSIS — R41840 Attention and concentration deficit: Secondary | ICD-10-CM

## 2021-07-23 DIAGNOSIS — I69318 Other symptoms and signs involving cognitive functions following cerebral infarction: Secondary | ICD-10-CM

## 2021-07-23 DIAGNOSIS — F900 Attention-deficit hyperactivity disorder, predominantly inattentive type: Secondary | ICD-10-CM

## 2021-07-23 MED ORDER — AMPHETAMINE-DEXTROAMPHETAMINE 20 MG PO TABS: 20.0000 mg | ORAL_TABLET | Freq: Every day | ORAL | 0 refills | Status: DC

## 2021-07-23 MED ORDER — AMPHETAMINE-DEXTROAMPHET ER 20 MG PO CP24: 20.0000 mg | ORAL_CAPSULE | Freq: Every day | ORAL | 0 refills | Status: DC
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
## 2021-07-23 NOTE — Telephone Encounter (Signed)
Rxes sent 
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
## 2021-07-23 NOTE — Patient Instructions (Addendum)
Look at your calendar/notebook every single day. Make this a habit by incorporating into your routine - make bed and check calendar   Check Register:  Make sure all transactions are written down Check the calculations    Be specific when you write yourself notes

## 2021-07-24 NOTE — Progress Notes (Signed)
[facility] Summary Report / Loop Recorder 
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
## 2021-07-25 ENCOUNTER — Encounter: Payer: Self-pay | Admitting: Occupational Therapy

## 2021-07-25 ENCOUNTER — Ambulatory Visit: Payer: 59

## 2021-07-25 ENCOUNTER — Ambulatory Visit: Payer: 59 | Admitting: Occupational Therapy

## 2021-07-25 ENCOUNTER — Encounter (HOSPITAL_BASED_OUTPATIENT_CLINIC_OR_DEPARTMENT_OTHER): Payer: Self-pay

## 2021-07-25 DIAGNOSIS — R2681 Unsteadiness on feet: Secondary | ICD-10-CM

## 2021-07-25 DIAGNOSIS — R41842 Visuospatial deficit: Secondary | ICD-10-CM

## 2021-07-25 DIAGNOSIS — R41840 Attention and concentration deficit: Secondary | ICD-10-CM

## 2021-07-25 DIAGNOSIS — I69318 Other symptoms and signs involving cognitive functions following cerebral infarction: Secondary | ICD-10-CM

## 2021-07-25 DIAGNOSIS — R41844 Frontal lobe and executive function deficit: Secondary | ICD-10-CM

## 2021-07-25 DIAGNOSIS — R41841 Cognitive communication deficit: Secondary | ICD-10-CM

## 2021-07-25 NOTE — Patient Instructions (Addendum)
Think about scheduling your thinking times:  Examples:  Work for an hour (set a timer), take a half hour break Work for half an hour (set a timer), take an hour break  Write down where you left off or short summary of what you have read (sticky notes) Order a kitchen timer to help you achieve these goals    Add "bring notebook" to your sign on the door to remind you of all items you need for leaving home Be specific when you write down things you need to remember
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
## 2021-07-30 ENCOUNTER — Ambulatory Visit (INDEPENDENT_AMBULATORY_CARE_PROVIDER_SITE_OTHER): Payer: 59 | Admitting: Cardiology

## 2021-07-30 ENCOUNTER — Encounter (HOSPITAL_BASED_OUTPATIENT_CLINIC_OR_DEPARTMENT_OTHER): Payer: Self-pay | Admitting: Cardiology

## 2021-07-30 VITALS — BP 146/73 | HR 76 | Ht 70.0 in | Wt 167.1 lb

## 2021-07-30 DIAGNOSIS — Z87820 Personal history of traumatic brain injury: Secondary | ICD-10-CM | POA: Diagnosis not present

## 2021-07-30 DIAGNOSIS — Z8673 Personal history of transient ischemic attack (TIA), and cerebral infarction without residual deficits: Secondary | ICD-10-CM

## 2021-07-30 DIAGNOSIS — N1832 Chronic kidney disease, stage 3b: Secondary | ICD-10-CM

## 2021-07-30 DIAGNOSIS — I5189 Other ill-defined heart diseases: Secondary | ICD-10-CM | POA: Diagnosis not present

## 2021-07-30 DIAGNOSIS — I429 Cardiomyopathy, unspecified: Secondary | ICD-10-CM

## 2021-07-30 DIAGNOSIS — E782 Mixed hyperlipidemia: Secondary | ICD-10-CM

## 2021-07-30 DIAGNOSIS — Z79899 Other long term (current) drug therapy: Secondary | ICD-10-CM

## 2021-07-30 DIAGNOSIS — I63512 Cerebral infarction due to unspecified occlusion or stenosis of left middle cerebral artery: Secondary | ICD-10-CM

## 2021-07-30 MED ORDER — EZETIMIBE 10 MG PO TABS: 10.0000 mg | ORAL_TABLET | Freq: Every day | ORAL | 3 refills | Status: DC
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
## 2021-07-30 NOTE — Patient Instructions (Signed)
Medication Instructions: START: Zetia 10 mg daily  *If you need a refill on your cardiac medications before your next appointment, please call your pharmacy*   Lab Work: Your provider has recommended lab work in September, 2023 (fasting lipid) . Please have this collected at [facility] at [place]. The lab is open 8:00 am - 4:30 pm. Please avoid 12:00p - 1:00p for lunch hour. You do not need an appointment. Please go to [address] [place], [place] [address]. This is in the Primary Care office on the 3rd floor, let them know you are there for blood work and they will direct you to the lab.  If you have labs (blood work) drawn today and your tests are completely normal, you will receive your results only by: [place] (if you have MyChart) OR A paper copy in the mail If you have any lab test that is abnormal or we need to change your treatment, we will call you to review the results.   Testing/Procedures: None ordered today   Follow-Up: At [facility], you and your health needs are our priority.  As part of our continuing mission to provide you with exceptional heart care, we have created designated Provider Care Teams.  These Care Teams include your primary Cardiologist (physician) and Advanced Practice Providers (APPs -  Physician Assistants and Nurse Practitioners) who all work together to provide you with the care you need, when you need it.  We recommend signing up for the patient portal called "MyChart".  Sign up information is provided on this After Visit Summary.  MyChart is used to connect with patients for Virtual Visits (Telemedicine).  Patients are able to view lab/test results, encounter notes, upcoming appointments, etc.  Non-urgent messages can be sent to your provider as well.   To learn more about what you can do with MyChart, go to [email or web address].    Your next appointment:   4 month(s)  The format for your next appointment:    In Person  Provider:   Buford Dresser, MD{

## 2021-07-31 ENCOUNTER — Telehealth: Payer: Self-pay

## 2021-07-31 NOTE — Telephone Encounter (Signed)
  Claim #[id number] THYHO OILNZ RN, [place] [id number] FAX [phone]  [place] Faxed for Bupropion XL 300 MG.

## 2021-08-01 NOTE — Telephone Encounter (Signed)
Fax not working. Okay per [name] F.,Scan to [email or web address]. Scan sent today.

## 2021-08-02 ENCOUNTER — Other Ambulatory Visit: Payer: Self-pay | Admitting: Student

## 2021-08-02 DIAGNOSIS — D75839 Thrombocytosis, unspecified: Secondary | ICD-10-CM

## 2021-08-04 ENCOUNTER — Other Ambulatory Visit: Payer: Self-pay | Admitting: Student

## 2021-08-05 ENCOUNTER — Ambulatory Visit (HOSPITAL_COMMUNITY)
Admission: RE | Admit: 2021-08-05 | Discharge: 2021-08-05 | Disposition: A | Discharge status: Home / Self Care | Payer: 59 | Source: Ambulatory Visit | Attending: Physician Assistant | Admitting: Physician Assistant

## 2021-08-05 ENCOUNTER — Encounter (HOSPITAL_COMMUNITY): Payer: Self-pay

## 2021-08-05 ENCOUNTER — Ambulatory Visit (INDEPENDENT_AMBULATORY_CARE_PROVIDER_SITE_OTHER): Payer: 59

## 2021-08-05 ENCOUNTER — Other Ambulatory Visit: Payer: Self-pay

## 2021-08-05 DIAGNOSIS — Z1589 Genetic susceptibility to other disease: Secondary | ICD-10-CM

## 2021-08-05 DIAGNOSIS — Z87820 Personal history of traumatic brain injury: Secondary | ICD-10-CM | POA: Insufficient documentation

## 2021-08-05 DIAGNOSIS — K219 Gastro-esophageal reflux disease without esophagitis: Secondary | ICD-10-CM | POA: Insufficient documentation

## 2021-08-05 DIAGNOSIS — R7989 Other specified abnormal findings of blood chemistry: Secondary | ICD-10-CM | POA: Diagnosis not present

## 2021-08-05 DIAGNOSIS — Z1379 Encounter for other screening for genetic and chromosomal anomalies: Secondary | ICD-10-CM | POA: Diagnosis not present

## 2021-08-05 DIAGNOSIS — D75839 Thrombocytosis, unspecified: Secondary | ICD-10-CM | POA: Insufficient documentation

## 2021-08-05 DIAGNOSIS — I129 Hypertensive chronic kidney disease with stage 1 through stage 4 chronic kidney disease, or unspecified chronic kidney disease: Secondary | ICD-10-CM | POA: Diagnosis present

## 2021-08-05 DIAGNOSIS — R718 Other abnormality of red blood cells: Secondary | ICD-10-CM | POA: Insufficient documentation

## 2021-08-05 DIAGNOSIS — D471 Chronic myeloproliferative disease: Secondary | ICD-10-CM | POA: Insufficient documentation

## 2021-08-05 DIAGNOSIS — N189 Chronic kidney disease, unspecified: Secondary | ICD-10-CM | POA: Insufficient documentation

## 2021-08-05 DIAGNOSIS — I429 Cardiomyopathy, unspecified: Secondary | ICD-10-CM | POA: Diagnosis not present

## 2021-08-05 LAB — CUP PACEART REMOTE DEVICE CHECK
Date Time Interrogation Session: 20230730130913
Implantable Pulse Generator Implant Date: 20230421

## 2021-08-05 LAB — CBC WITH DIFFERENTIAL/PLATELET
Abs Immature Granulocytes: 0.04 10*3/uL (ref 0.00–0.07)
Basophils Absolute: 0.1 10*3/uL (ref 0.0–0.1)
Basophils Relative: 1 %
Eosinophils Absolute: 0.2 10*3/uL (ref 0.0–0.5)
Eosinophils Relative: 3 %
HCT: 42.6 % (ref 39.0–52.0)
Hemoglobin: 15.2 g/dL (ref 13.0–17.0)
Immature Granulocytes: 1 %
Lymphocytes Relative: 15 %
Lymphs Abs: 1.1 10*3/uL (ref 0.7–4.0)
MCH: 32 pg (ref 26.0–34.0)
MCHC: 35.7 g/dL (ref 30.0–36.0)
MCV: 89.7 fL (ref 80.0–100.0)
Monocytes Absolute: 0.8 10*3/uL (ref 0.1–1.0)
Monocytes Relative: 11 %
Neutro Abs: 5.1 10*3/uL (ref 1.7–7.7)
Neutrophils Relative %: 69 %
Platelets: 412 10*3/uL — ABNORMAL HIGH (ref 150–400)
RBC: 4.75 MIL/uL (ref 4.22–5.81)
RDW: 16 % — ABNORMAL HIGH (ref 11.5–15.5)
WBC: 7.3 10*3/uL (ref 4.0–10.5)
nRBC: 0 % (ref 0.0–0.2)

## 2021-08-05 LAB — GLUCOSE, CAPILLARY: Glucose-Capillary: 76 mg/dL (ref 70–99)

## 2021-08-05 MED ORDER — FENTANYL CITRATE (PF) 100 MCG/2ML IJ SOLN
INTRAMUSCULAR | Status: AC | PRN
  Administered 2021-08-05 (×2): 50 ug via INTRAVENOUS

## 2021-08-05 MED ORDER — SODIUM CHLORIDE 0.9 % IV SOLN: INTRAVENOUS | Status: DC

## 2021-08-05 MED ORDER — MIDAZOLAM HCL 2 MG/2ML IJ SOLN
INTRAMUSCULAR | Status: AC
  Filled 2021-08-05: qty 4

## 2021-08-05 MED ORDER — MIDAZOLAM HCL 2 MG/2ML IJ SOLN
INTRAMUSCULAR | Status: AC | PRN
  Administered 2021-08-05 (×2): 1 mg via INTRAVENOUS

## 2021-08-05 MED ORDER — LIDOCAINE HCL (PF) 1 % IJ SOLN
INTRAMUSCULAR | Status: AC | PRN
  Administered 2021-08-05: 20 mL

## 2021-08-05 MED ORDER — FENTANYL CITRATE (PF) 100 MCG/2ML IJ SOLN
INTRAMUSCULAR | Status: AC
  Filled 2021-08-05: qty 2

## 2021-08-05 NOTE — Discharge Instructions (Signed)
Bone Marrow Aspiration and Bone Marrow Biopsy, Adult, Care After This sheet gives you information about how to care for yourself after your procedure. Your health care provider may also give you more specific instructions. If you have problems or questions, contact your health careprovider. What can I expect after the procedure? After the procedure, it is common to have: Mild pain and tenderness. Swelling. Bruising. Follow these instructions at home: Puncture site care Follow instructions from your health care provider about how to take care of the puncture site. Make sure you: Wash your hands with soap and water before and after you change your bandage (dressing). If soap and water are not available, use hand sanitizer. Change your dressing as told by your health care provider. Check your puncture site every day for signs of infection. Check for: More redness, swelling, or pain. Fluid or blood. Warmth. Pus or a bad smell.  Activity Return to your normal activities as told by your health care provider. Ask your health care provider what activities are safe for you. Do not lift anything that is heavier than 10 lb (4.5 kg), or the limit that you are told, until your health care provider says that it is safe. Do not drive for 24 hours if you were given a sedative during your procedure. General instructions Take over-the-counter and prescription medicines only as told by your health care provider. Do not take baths, swim, or use a hot tub until your health care provider approves. Ask your health care provider if you may take showers. You may only be allowed to take sponge baths. If directed, put ice on the affected area. To do this: Put ice in a plastic bag. Place a towel between your skin and the bag. Leave the ice on for 20 minutes, 2-3 times a day. Keep all follow-up visits as told by your health care provider. This is important.  Contact a health care provider if: Your pain is not  controlled with medicine. You have a fever. You have more redness, swelling, or pain around the puncture site. You have fluid or blood coming from the puncture site. Your puncture site feels warm to the touch. You have pus or a bad smell coming from the puncture site. Summary After the procedure, it is common to have mild pain, tenderness, swelling, and bruising. Follow instructions from your health care provider about how to take care of the puncture site and what activities are safe for you. Take over-the-counter and prescription medicines only as told by your health care provider. Contact a health care provider if you have any signs of infection, such as fluid or blood coming from the puncture site. This information is not intended to replace advice given to you by your health care provider. Make sure you discuss any questions you have with your healthcare provider. Document Revised: 05/11/2018 Document Reviewed: 05/11/2018 Elsevier Patient Education  2022 [employer]. For questions /concerns may call Interventional Radiology at [phone] or  Interventional Radiology clinic [phone]   You may remove your dressing and shower tomorrow afternoon   Moderate Conscious Sedation, Adult, Care After This sheet gives you information about how to care for yourself after your procedure. Your health care provider may also give you more specific instructions. If you have problems or questions, contact your health care provider. What can I expect after the procedure? After the procedure, it is common to have: Sleepiness for several hours. Impaired judgment for several hours. Difficulty with balance. Vomiting if you eat too  soon. Follow these instructions at home: For the time period you were told by your health care provider:     Rest. Do not participate in activities where you could fall or become injured. Do not drive or use machinery. Do not drink alcohol. Do not take sleeping  pills or medicines that cause drowsiness. Do not make important decisions or sign legal documents. Do not take care of children on your own. Eating and drinking  Follow the diet recommended by your health care provider. Drink enough fluid to keep your urine pale yellow. If you vomit: Drink water, juice, or soup when you can drink without vomiting. Make sure you have little or no nausea before eating solid foods. General instructions Take over-the-counter and prescription medicines only as told by your health care provider. Have a responsible adult stay with you for the time you are told. It is important to have someone help care for you until you are awake and alert. Do not smoke. Keep all follow-up visits as told by your health care provider. This is important. Contact a health care provider if: You are still sleepy or having trouble with balance after 24 hours. You feel light-headed. You keep feeling nauseous or you keep vomiting. You develop a rash. You have a fever. You have redness or swelling around the IV site. Get help right away if: You have trouble breathing. You have new-onset confusion at home. Summary After the procedure, it is common to feel sleepy, have impaired judgment, or feel nauseous if you eat too soon. Rest after you get home. Know the things you should not do after the procedure. Follow the diet recommended by your health care provider and drink enough fluid to keep your urine pale yellow. Get help right away if you have trouble breathing or new-onset confusion at home. This information is not intended to replace advice given to you by your health care provider. Make sure you discuss any questions you have with your health care provider. Document Revised: 04/22/2019 Document Reviewed: 11/18/2018 Elsevier Patient Education  [place].

## 2021-08-05 NOTE — Procedures (Signed)
Interventional Radiology Procedure Note  Procedure: CT guided aspirate and core biopsy of right posterior iliac bone Complications: None Recommendations: - Bedrest supine x 1 hrs - OTC's PRN  Pain - Follow biopsy results  Signed,  Averill Winters S. Celes Dedic, DO    

## 2021-08-05 NOTE — Consult Note (Signed)
Chief Complaint: Patient was seen in consultation today for CT guided bone marrow biopsy  Referring Physician(s): Dorsey,J  Supervising Physician: Corrie Mckusick  Patient Status: [facility] - Out-pt  History of Present Illness: Gary [name] is a 76 y.o. male with PMH sig for ADHD, chronic kidney disease, GERD, diverticulosis, nephrolithiasis, hypertension, traumatic brain injury, monocytosis and JAK2 mutation.  He presents today for CT-guided bone marrow biopsy to rule out myeloproliferative neoplasm.  Past Medical History:  Diagnosis Date   ADHD    Allergy    CKD (chronic kidney disease)    GERD (gastroesophageal reflux disease)    History of chickenpox    History of diverticulitis 2007   History of kidney stones    HTN (hypertension)    Reflux    Renal disorder    kidney stones   TBI (traumatic brain injury) ([place]) 08/11/2019   TBI (traumatic brain injury) ([place]) 08/11/2019   Trauma     Past Surgical History:  Procedure Laterality Date   LOOP RECORDER INSERTION N/A 04/26/2021   Procedure: LOOP RECORDER INSERTION;  Surgeon: Sanda Klein, MD;  Location: [place] [facility];  Service: Cardiovascular;  Laterality: N/A;   MINOR REMOVAL OF MANDIBULAR HARDWARE N/A 12/15/2019   Procedure: REMOVAL OF RIGHT LATERAL ORBITAL MINIPLATE;  Surgeon: [name], DO;  Location: [place];  Service: Plastics;  Laterality: N/A;   ORIF MANDIBULAR FRACTURE Bilateral 07/27/2019   Procedure: OPEN REDUCTION INTERNAL FIXATION (ORIF) OF COMPLEX ZYGOMATIC FRACTURE;  Surgeon: [name], DO;  Location: [place];  Service: Plastics;  Laterality: Bilateral;  2 hours, please    Allergies: Levaquin [levofloxacin] and Shellfish allergy  Medications: Prior to Admission medications   Medication Sig Start Date End Date Taking? Authorizing Provider  acetaminophen (TYLENOL) 325 MG tablet Take 1-2 tablets (325-650 mg total) by mouth every 4 (four) hours as needed for mild pain. 05/03/21  Yes  Setzer, Edman Circle, PA-C  amLODipine (NORVASC) 2.5 MG tablet Take 1 tablet (2.5 mg total) by mouth daily. 05/29/21 05/29/22 Yes Meredith Staggers, MD  amphetamine-dextroamphetamine (ADDERALL) 20 MG tablet Take 1 tablet (20 mg total) by mouth daily. 07/23/21 07/23/22 Yes Meredith Staggers, MD  azelastine (ASTELIN) 0.1 % nasal spray Place 1 spray into both nostrils 2 (two) times daily. Use in each nostril as directed 02/22/21  Yes Maximiano Coss, NP  b complex vitamins capsule Take 1 capsule by mouth daily.   Yes [provider]  buPROPion (WELLBUTRIN XL) 300 MG 24 hr tablet TAKE 1 TABLET(300 MG) BY MOUTH DAILY 03/04/21  Yes Meredith Staggers, MD  carvedilol (COREG) 3.125 MG tablet Take 1 tablet (3.125 mg total) by mouth 2 (two) times daily with a meal. 05/29/21  Yes Meredith Staggers, MD  cetirizine (ZYRTEC) 10 MG tablet Take 1 tablet (10 mg total) by mouth daily. 02/22/21  Yes Maximiano Coss, NP  ezetimibe (ZETIA) 10 MG tablet Take 1 tablet (10 mg total) by mouth daily. 07/30/21 07/25/22 Yes Buford Dresser, MD  finasteride (PROSCAR) 5 MG tablet Take 1 tablet (5 mg total) by mouth daily. 03/21/21  Yes Maximiano Coss, NP  hydrALAZINE (APRESOLINE) 10 MG tablet Take 1 tablet (10 mg total) by mouth 3 (three) times daily. 06/17/21  Yes Buford Dresser, MD  isosorbide mononitrate (IMDUR) 30 MG 24 hr tablet Take 1 tablet (30 mg total) by mouth daily. 06/17/21 06/12/22 Yes Buford Dresser, MD  levETIRAcetam (KEPPRA) 500 MG tablet Take 1 tablet (500 mg total) by mouth 2 (two) times  daily. 06/18/21  Yes McCue, Janett Billow, NP  Multiple Vitamin (MULTIVITAMIN WITH MINERALS) TABS tablet Take 1 tablet by mouth daily.   Yes [provider]  pantoprazole (PROTONIX) 40 MG tablet TAKE 1 TABLET(40 MG) BY MOUTH DAILY 06/05/21  Yes Maximiano Coss, NP  aspirin 81 MG EC tablet Take 1 tablet (81 mg total) by mouth daily. Swallow whole. 05/03/21   Setzer, Edman Circle, PA-C  diclofenac Sodium (VOLTAREN) 1 %  GEL Apply 2 g topically 4 (four) times daily as needed (foot pain). 05/03/21   Setzer, Edman Circle, PA-C  rosuvastatin (CRESTOR) 20 MG tablet Take 1 tablet (20 mg total) by mouth daily. 05/29/21   Meredith Staggers, MD  traMADol (ULTRAM) 50 MG tablet Take 0.5-1 tablets (25-50 mg total) by mouth daily as needed. 05/29/21   Meredith Staggers, MD     Family History  Problem Relation Age of Onset   Heart disease Mother    Hypertension Mother    Stroke Mother    Polycythemia Father    Polycythemia Brother    Leukemia Brother     Social History   Socioeconomic History   Marital status: Married    Spouse name: Soul Hackman   Number of children: Not on file   Years of education: Not on file   Highest education level: Not on file  Occupational History   Occupation: Warden/ranger    Comment: [employer]  Tobacco Use   Smoking status: Former    Types: Cigarettes   Smokeless tobacco: Never  Scientific laboratory technician Use: Never used  Substance and Sexual Activity   Alcohol use: Not Currently   Drug use: Never   Sexual activity: Yes  Other Topics Concern   Not on file  Social History Narrative   ** Merged History Encounter **       Social Determinants of Health   Financial Resource Strain: Not on file  Food Insecurity: Not on file  Transportation Needs: Not on file  Physical Activity: Not on file  Stress: Not on file  Social Connections: Not on file      Review of Systems denies fever,HA,CP,dyspnea, cough, abd pain,N/V or bleeding; he does have double vision, back pain, short term memory issues, balance difficulties; walks with cane- noted hx TBI 2 years ago  Vital Signs: BP (!) 150/75   Pulse 68   Temp 97.7 F (36.5 C) (Oral)   Resp 18   SpO2 99%      Physical Exam awake,answers simple questions ok, wife in room; chest- CTA bilat; loop recorder mid chest region; heart- RRR; abd- soft,+BS,NT; no LE ede,a  Imaging: No results found.  Labs:  CBC: Recent Labs     04/27/21 0511 05/02/21 0817 05/13/21 1650 05/31/21 1321  WBC 10.0 6.8 7.6 7.4  HGB 15.4 15.0 15.4 15.6  HCT 45.3 41.0 44.3 47.3  PLT 492* 579* 586* 592*    COAGS: Recent Labs    04/23/21 1335  INR 1.2  APTT 33    BMP: Recent Labs    04/26/21 0255 05/02/21 0511 05/03/21 0502 05/13/21 1145 05/31/21 1321  NA 139 140 140 143 143  K 3.8 4.4 3.8 5.1 4.6  CL 109 111 108 106 110  CO2 22 21* _0 GLUCOSE 89 88 86 78 93  BUN 18 24* 23 22 33*  CALCIUM 8.6* 9.1 9.2 9.3 9.8  CREATININE 1.90* 1.82* 2.09* 1.99* 2.09*  GFRNONAA 37* 39* 33*  --  33*  LIVER FUNCTION TESTS: Recent Labs    04/23/21 1501 04/24/21 0414 04/25/21 0339 04/26/21 0255 05/13/21 1145 05/31/21 1321  BILITOT 1.0 0.9  --   --  0.5 0.4  AST 34 28  --   --  24 26  ALT 16 15  --   --  15 23  ALKPHOS 82 87  --   --  86 96  PROT 6.7 6.4*  --   --  6.9 7.4  ALBUMIN 3.4* 3.2* 2.8* 2.9* 3.9 4.0    TUMOR MARKERS: No results for input(s): "AFPTM", "CEA", "CA199", "CHROMGRNA" in the last 8760 hours.  Assessment and Plan: 76 y.o. male with PMH sig for ADHD, chronic kidney disease, GERD, diverticulosis, nephrolithiasis, hypertension, traumatic brain injury, monocytosis and JAK2 mutation.  He presents today for CT-guided bone marrow biopsy to rule out myeloproliferative neoplasm.Risks and benefits of procedure was discussed with the patient/spouse  including, but not limited to bleeding, infection, damage to adjacent structures or low yield requiring additional tests.  All of the questions were answered and there is agreement to proceed.  Consent signed and in chart.    Thank you for this interesting consult.  I greatly enjoyed meeting Sanjay Broadfoot and look forward to participating in their care.  A copy of this report was sent to the requesting provider on this date.  Electronically Signed: D. Rowe Robert, PA-C 08/05/2021, 7:56 AM   I spent a total of 20 minutes    in face to face in clinical  consultation, greater than 50% of which was counseling/coordinating care for CT guided bone marrow biopsy

## 2021-08-06 ENCOUNTER — Other Ambulatory Visit: Payer: Self-pay

## 2021-08-06 NOTE — Patient Outreach (Signed)
[facility] [facility]) Care Management  08/06/2021  Gary [name] 1945/05/31 [id number]   Telephone outreach to patient to obtain mRS was successfully completed. MRS=3  [place] Care Management Assistant [phone]

## 2021-08-07 ENCOUNTER — Other Ambulatory Visit: Payer: Self-pay | Admitting: Hematology and Oncology

## 2021-08-07 LAB — SURGICAL PATHOLOGY

## 2021-08-07 MED ORDER — HYDROXYUREA 500 MG PO CAPS: 500.0000 mg | ORAL_CAPSULE | Freq: Every day | ORAL | 2 refills | Status: DC

## 2021-08-08 ENCOUNTER — Telehealth: Payer: Self-pay | Admitting: Hematology and Oncology

## 2021-08-08 ENCOUNTER — Inpatient Hospital Stay: Payer: 59

## 2021-08-08 ENCOUNTER — Inpatient Hospital Stay: Payer: 59 | Admitting: Hematology and Oncology

## 2021-08-08 ENCOUNTER — Telehealth: Payer: Self-pay | Admitting: Physician Assistant

## 2021-08-08 NOTE — Telephone Encounter (Signed)
I spoke to [name]'s wife to review the bone marrow biopsy results. Findings confirm essential thrombocytosis. Dr. Lorenso Courier recommends initiating therapy with Hydrea 500 mg PO daily and continue with ASA 81 mg PO daily. Reviewed side effects of Hydrea including cytopenia, bleeding and cutaneous ulcers. Patient will return in 4 weeks for a follow up visit to monitor blood counts and toxicities. [name] expressed understanding of the plan provided.

## 2021-08-08 NOTE — Telephone Encounter (Signed)
Per 8/3 schmsg called and spoke to pt wife about appointment. She confirmed appointment.

## 2021-08-15 ENCOUNTER — Ambulatory Visit: Payer: 59 | Admitting: Registered Nurse

## 2021-08-15 ENCOUNTER — Ambulatory Visit: Payer: 59 | Admitting: Family Medicine

## 2021-08-17 ENCOUNTER — Encounter: Payer: Self-pay | Admitting: Physician Assistant

## 2021-08-24 ENCOUNTER — Other Ambulatory Visit: Payer: Self-pay | Admitting: Physical Medicine & Rehabilitation

## 2021-08-27 ENCOUNTER — Other Ambulatory Visit: Payer: Self-pay | Admitting: Physical Medicine & Rehabilitation

## 2021-08-27 DIAGNOSIS — F329 Major depressive disorder, single episode, unspecified: Secondary | ICD-10-CM

## 2021-08-28 ENCOUNTER — Other Ambulatory Visit: Payer: Self-pay | Admitting: Physical Medicine & Rehabilitation

## 2021-08-28 ENCOUNTER — Encounter
Payer: No Typology Code available for payment source | Attending: Psychology | Admitting: Physical Medicine & Rehabilitation

## 2021-08-28 ENCOUNTER — Encounter: Payer: Self-pay | Admitting: Physical Medicine & Rehabilitation

## 2021-08-28 VITALS — BP 167/80 | HR 72 | Ht 70.0 in | Wt 170.6 lb

## 2021-08-28 DIAGNOSIS — F900 Attention-deficit hyperactivity disorder, predominantly inattentive type: Secondary | ICD-10-CM | POA: Diagnosis present

## 2021-08-28 DIAGNOSIS — Z87820 Personal history of traumatic brain injury: Secondary | ICD-10-CM | POA: Insufficient documentation

## 2021-08-28 DIAGNOSIS — I7300 Raynaud's syndrome without gangrene: Secondary | ICD-10-CM

## 2021-08-28 DIAGNOSIS — G44329 Chronic post-traumatic headache, not intractable: Secondary | ICD-10-CM | POA: Insufficient documentation

## 2021-08-28 DIAGNOSIS — G894 Chronic pain syndrome: Secondary | ICD-10-CM | POA: Diagnosis present

## 2021-08-28 DIAGNOSIS — Z5181 Encounter for therapeutic drug level monitoring: Secondary | ICD-10-CM | POA: Diagnosis present

## 2021-08-28 DIAGNOSIS — M47816 Spondylosis without myelopathy or radiculopathy, lumbar region: Secondary | ICD-10-CM | POA: Insufficient documentation

## 2021-08-28 DIAGNOSIS — Z79891 Long term (current) use of opiate analgesic: Secondary | ICD-10-CM | POA: Diagnosis present

## 2021-08-28 DIAGNOSIS — M67912 Unspecified disorder of synovium and tendon, left shoulder: Secondary | ICD-10-CM | POA: Insufficient documentation

## 2021-08-28 MED ORDER — AMPHETAMINE-DEXTROAMPHETAMINE 20 MG PO TABS: 20.0000 mg | ORAL_TABLET | Freq: Every day | ORAL | 0 refills | Status: DC

## 2021-08-28 MED ORDER — AMPHETAMINE-DEXTROAMPHET ER 30 MG PO CP24: 30.0000 mg | ORAL_CAPSULE | Freq: Every day | ORAL | 0 refills | Status: DC

## 2021-08-28 NOTE — Patient Instructions (Addendum)
PLEASE FEEL FREE TO CALL OUR OFFICE WITH ANY PROBLEMS OR QUESTIONS ([phone])  LET ME KNOW IF YOU'RE MOOD IS NOT IMPROVING.

## 2021-08-28 NOTE — Progress Notes (Signed)
Subjective:    Patient ID: Gary [name], male    DOB: May 08, 1945, 76 y.o.   MRN: [id number]  HPI  [name] is here in follow-up of his recent CVA as well as his traumatic brain injury. He says things are about the same. He's walking 3-4  x per week. He also stretches and uses bands at home.  Uses a cane for balance.  He'll go out to the store or for food with his aide during the week. He has gotten out to the water park with his granddaughter a couple times.  I have asked him numerous times over the last couple years whether he feels depressed.  Today for the first time he said he does feel somewhat depressed.  Apparently he did some reading on what the symptoms are.  He also has some concerns that he might have PTSD.  He remains on Wellbutrin 300 mg daily which apparently [employer] gave him some difficulties with.  He is also on his Adderall 20 mg XR in the morning with the 20 mg immediate release in the afternoon.  He developed low back pain about a month ago. It's worst in the morning. It has lessened somewhat and only involves the right side now. There is no radiation. He usually needs a moment to stretch in the morning.     Pain Inventory Average Pain 5 Pain Right Now 5 My pain is aching  LOCATION OF PAIN  back  BOWEL Number of stools per week: 7 Oral laxative use No  Type of laxative . Enema or suppository use No  History of colostomy No  Incontinent No   BLADDER Normal In and out cath, frequency . Able to self cath  . Bladder incontinence No  Frequent urination No  Leakage with coughing No  Difficulty starting stream Yes  Incomplete bladder emptying No    Mobility use a cane how many minutes can you walk? 60 ability to climb steps?  yes do you drive?  no  Function employed # of hrs/week workers comp disabled: date disabled WC  Neuro/Psych trouble walking  Prior Studies Any changes since last visit?  no  Physicians involved in your  care Any changes since last visit?  no   Family History  Problem Relation Age of Onset   Heart disease Mother    Hypertension Mother    Stroke Mother    Polycythemia Father    Polycythemia Brother    Leukemia Brother    Social History   Socioeconomic History   Marital status: Married    Spouse name: Nature Vogelsang   Number of children: Not on file   Years of education: Not on file   Highest education level: Not on file  Occupational History   Occupation: Warden/ranger    Comment: [employer]  Tobacco Use   Smoking status: Former    Types: Cigarettes   Smokeless tobacco: Never  Scientific laboratory technician Use: Never used  Substance and Sexual Activity   Alcohol use: Not Currently   Drug use: Never   Sexual activity: Yes  Other Topics Concern   Not on file  Social History Narrative   ** Merged History Encounter **       Social Determinants of Health   Financial Resource Strain: Not on file  Food Insecurity: Not on file  Transportation Needs: Not on file  Physical Activity: Not on file  Stress: Not on file  Social Connections: Not on file  Past Surgical History:  Procedure Laterality Date   LOOP RECORDER INSERTION N/A 04/26/2021   Procedure: LOOP RECORDER INSERTION;  Surgeon: Sanda Klein, MD;  Location: [place] [facility];  Service: Cardiovascular;  Laterality: N/A;   MINOR REMOVAL OF MANDIBULAR HARDWARE N/A 12/15/2019   Procedure: REMOVAL OF RIGHT LATERAL ORBITAL MINIPLATE;  Surgeon: [name], DO;  Location: [place];  Service: Plastics;  Laterality: N/A;   ORIF MANDIBULAR FRACTURE Bilateral 07/27/2019   Procedure: OPEN REDUCTION INTERNAL FIXATION (ORIF) OF COMPLEX ZYGOMATIC FRACTURE;  Surgeon: [name], DO;  Location: [place];  Service: Plastics;  Laterality: Bilateral;  2 hours, please   Past Medical History:  Diagnosis Date   ADHD    Allergy    CKD (chronic kidney disease)    GERD (gastroesophageal reflux disease)    History of  chickenpox    History of diverticulitis 2007   History of kidney stones    HTN (hypertension)    Reflux    Renal disorder    kidney stones   TBI (traumatic brain injury) ([place]) 08/11/2019   TBI (traumatic brain injury) ([name]) 08/11/2019   Trauma    BP (!) 167/80   Pulse 72   Ht '5\' 10"'$  (1.778 m)   Wt 170 lb 9.6 oz (77.4 kg)   SpO2 99%   BMI 24.48 kg/m   Opioid Risk Score:   Fall Risk Score:  `1  Depression screen [facility] 2/9     05/29/2021   11:07 AM 01/30/2021   11:37 AM 10/26/2020    9:44 AM 10/03/2020   11:40 AM 03/21/2020   10:11 AM 11/16/2019    9:08 AM 09/21/2019    9:18 AM  Depression screen PHQ 2/9  Decreased Interest 0  '1 2 1 '$ 0 1  Down, Depressed, Hopeless 0 '1 1 2 1 '$ 0 0  PHQ - 2 Score 0 '1 2 4 2 '$ 0 1  Altered sleeping   0    2  Tired, decreased energy   3    2  Change in appetite   0    0  Feeling bad or failure about yourself    0    0  Trouble concentrating   3    3  Moving slowly or fidgety/restless   2    3  Suicidal thoughts   0    0  PHQ-9 Score   10    11  Difficult doing work/chores   Somewhat difficult         Review of Systems  Musculoskeletal:  Positive for back pain and gait problem.  All other systems reviewed and are negative.     Objective:   Physical Exam  General: No acute distress HEENT: NCAT, EOMI, oral membranes moist Cards: reg rate  Chest: normal effort Abdomen: Soft, NT, ND Skin: dry, intact Extremities: no edema Psych: flat, but polite and appropriate  Skin: hands sl cool Neuro: Patient is alert and oriented to month but not the date or year.  He knew the day of the week.  He is able to follow basic commands.  He still lags in processing and demonstrated some impairment in his insight and awareness.  Cranial nerve exam was somewhat nonfocal.  Strength is grossly 5 out of 5.     Gait was fairly steady with cane.  He did take his time when he turned around although his feet did tangled up a bit.  No focal limb ataxia was seen.  No  gross sensory abnormalities. Musculoskeletal: Low back tenderness with flexion along the right lower lumbar region. He favors the side when he bends as well. Extension, lateral movements and facet maneuvers did not cause pain.         PLAN: 1. 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 I suspect his mood is involved as well as the recent stroke he had.See discussion below    2.  Antithrombotics: On ASA alone 3. Pain in toes/feet/fingers and low back pain:  -?some improvement -keep hands and feet warms, covered when possible.     -Gave him a few Tylenol to use for his low back,25-'50mg'$  qd prn 4. Depression: LCSW to evaluate and provide emotional support             --reactive depression: Continue with Wellbutrin for now.                             -In the meantime we will increase his Adderall XR to 30 mg daily mg daily. This may help with cognition as well -Continue Adderall immediate release 20 mg daily in the afternoon             -Recommend f/u with Dr. 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 Insomnia             Seems improved, off trazodone 18. Mild thrombocytosis:  -Recent platelet count at the end of July was down to 4 and 12,000 -I think close observation is reasonable at this point  30 minutes of face to face patient care time were spent during  this visit. All questions were encouraged and answered.  Case was discussed with WC case Freight forwarder.  Follow up with me in 3 MOS .

## 2021-08-31 LAB — DRUG TOX METHYLPHEN W/CONF,ORAL FLD: Methylphenidate: NEGATIVE ng/mL (ref <1.0)

## 2021-09-03 ENCOUNTER — Ambulatory Visit: Payer: 59 | Admitting: Physician Assistant

## 2021-09-03 ENCOUNTER — Other Ambulatory Visit: Payer: 59

## 2021-09-03 LAB — DRUG TOX MONITOR 1 W/CONF, ORAL FLD
Amphetamine: >500 ng/mL — ABNORMAL HIGH (ref <10)
Amphetamines: POSITIVE ng/mL — AB (ref <10)
Barbiturates: NEGATIVE ng/mL (ref <10)
Benzodiazepines: NEGATIVE ng/mL (ref <0.50)
Buprenorphine: NEGATIVE ng/mL (ref <0.10)
Cocaine: NEGATIVE ng/mL (ref <5.0)
Fentanyl: NEGATIVE ng/mL (ref <0.10)
Heroin Metabolite: NEGATIVE ng/mL (ref <1.0)
MARIJUANA: NEGATIVE ng/mL (ref <2.5)
MDMA: NEGATIVE ng/mL (ref <10)
Meprobamate: NEGATIVE ng/mL (ref <2.5)
Methadone: NEGATIVE ng/mL (ref <5.0)
Methamphetamine: NEGATIVE ng/mL (ref <10)
Nicotine Metabolite: NEGATIVE ng/mL (ref <5.0)
Opiates: NEGATIVE ng/mL (ref <2.5)
Phencyclidine: NEGATIVE ng/mL (ref <10)
Tapentadol: NEGATIVE ng/mL (ref <5.0)
Tramadol: NEGATIVE ng/mL (ref <5.0)
Zolpidem: NEGATIVE ng/mL (ref <5.0)

## 2021-09-03 LAB — DRUG TOX ALC METAB W/CON, ORAL FLD: Alcohol Metabolite: NEGATIVE ng/mL (ref <25)

## 2021-09-04 ENCOUNTER — Ambulatory Visit (INDEPENDENT_AMBULATORY_CARE_PROVIDER_SITE_OTHER): Payer: 59 | Admitting: Family Medicine

## 2021-09-04 ENCOUNTER — Encounter: Payer: Self-pay | Admitting: Family Medicine

## 2021-09-04 VITALS — BP 136/72 | HR 78 | Temp 98.1°F | Ht 70.0 in | Wt 169.0 lb

## 2021-09-04 DIAGNOSIS — E782 Mixed hyperlipidemia: Secondary | ICD-10-CM

## 2021-09-04 DIAGNOSIS — R7989 Other specified abnormal findings of blood chemistry: Secondary | ICD-10-CM

## 2021-09-04 DIAGNOSIS — I10 Essential (primary) hypertension: Secondary | ICD-10-CM

## 2021-09-04 DIAGNOSIS — R944 Abnormal results of kidney function studies: Secondary | ICD-10-CM

## 2021-09-04 NOTE — Progress Notes (Signed)
   Subjective:  Patient ID: Gary [name], male    DOB: 1945/04/14  Age: 76 y.o. MRN: [id number]  CC:  Chief Complaint  Patient presents with   Hypertension    Pt states they just want to establish care     HPI [name] presents for   Follow-up of chronic conditions.  Last office visit with his PCP Maximiano Coss in May who is no longer at this office.  History of CVA.  Followed by cardiology, started on Zetia at July 25 visit.LDL 119 in April.  Continued on Crestor 20 mg daily. Followed by physical medicine and rehab - Dr. Naaman Plummer, with visit 8/23.  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  Blood pressure elevated at that visit, continued on Coreg and Norvasc, improved in office today during triage. He has been referred to nephrology for decreased GFR, with last creatinine 2.09 on 05/31/2021.  Range of 1.67-2.10 since October of last year.  Prior to me seeing patient, reason for visit was discussed with my medical assistant, then Radio broadcast assistant for office.  Patient, spouse were under the impression they would be establishing care at today's visit.  Unfortunately I am closed to new patients.  Denies any need for med refills, and decided to leave and reschedule with new PCP.  I called patient and spoke with Candy, has not heard from nephrology regarding prior appointment, and needs to see nephrology as cardiology planned on cath but limited d/t renal status.   I will order fasting labs for lab visit to check updated renal function and updated lipid panel since addition of Zetia.  Can be scheduled as a lab only visit here when convenient for patient.  Additional question from spouse regarding elevated platelets. Platelets have recently improved.  Question of whether or not  they really need to start hydroxyurea given the improvement.  She did discuss with Dr. Naaman Plummer as well.  On chart review, had mychart message with hematology on August 14, plan to hold on starting Hydroxyurea until visit with Dr. Lorenso Courier in September.  All questions were answered.  Signed,   Merri Ray, MD [place] 09/04/21 11:51 AM

## 2021-09-04 NOTE — Progress Notes (Signed)
I have LM for [facility] to call me back to see where the referral stands

## 2021-09-05 ENCOUNTER — Other Ambulatory Visit (INDEPENDENT_AMBULATORY_CARE_PROVIDER_SITE_OTHER): Payer: 59

## 2021-09-05 DIAGNOSIS — E782 Mixed hyperlipidemia: Secondary | ICD-10-CM

## 2021-09-05 DIAGNOSIS — I10 Essential (primary) hypertension: Secondary | ICD-10-CM | POA: Diagnosis not present

## 2021-09-05 DIAGNOSIS — R944 Abnormal results of kidney function studies: Secondary | ICD-10-CM

## 2021-09-05 LAB — COMPREHENSIVE METABOLIC PANEL
ALT: 19 U/L (ref 0–53)
AST: 26 U/L (ref 0–37)
Albumin: 3.9 g/dL (ref 3.5–5.2)
Alkaline Phosphatase: 86 U/L (ref 39–117)
BUN: 25 mg/dL — ABNORMAL HIGH (ref 6–23)
CO2: 26 mEq/L (ref 19–32)
Calcium: 9.3 mg/dL (ref 8.4–10.5)
Chloride: 108 mEq/L (ref 96–112)
Creatinine, Ser: 2.09 mg/dL — ABNORMAL HIGH (ref 0.40–1.50)
GFR: 30.75 mL/min — ABNORMAL LOW (ref >60.00)
Glucose, Bld: 93 mg/dL (ref 70–99)
Potassium: 4.3 mEq/L (ref 3.5–5.1)
Sodium: 143 mEq/L (ref 135–145)
Total Bilirubin: 0.4 mg/dL (ref 0.2–1.2)
Total Protein: 7.1 g/dL (ref 6.0–8.3)

## 2021-09-05 LAB — LIPID PANEL
Cholesterol: 94 mg/dL (ref 0–200)
HDL: 31.2 mg/dL — ABNORMAL LOW (ref >39.00)
LDL Cholesterol: 40 mg/dL (ref 0–99)
NonHDL: 62.46
Total CHOL/HDL Ratio: 3
Triglycerides: 110 mg/dL (ref 0.0–149.0)
VLDL: 22 mg/dL (ref 0.0–40.0)

## 2021-09-05 NOTE — Progress Notes (Signed)
[facility] Summary Report / Loop Recorder 

## 2021-09-06 ENCOUNTER — Telehealth: Payer: Self-pay | Admitting: *Deleted

## 2021-09-06 NOTE — Telephone Encounter (Signed)
Oral swab drug screen was consistent for prescribed medications.  ?

## 2021-09-09 ENCOUNTER — Other Ambulatory Visit: Payer: Self-pay | Admitting: Hematology and Oncology

## 2021-09-09 DIAGNOSIS — D473 Essential (hemorrhagic) thrombocythemia: Secondary | ICD-10-CM

## 2021-09-09 DIAGNOSIS — D75839 Thrombocytosis, unspecified: Secondary | ICD-10-CM

## 2021-09-10 ENCOUNTER — Inpatient Hospital Stay (HOSPITAL_BASED_OUTPATIENT_CLINIC_OR_DEPARTMENT_OTHER): Payer: 59 | Admitting: Hematology and Oncology

## 2021-09-10 ENCOUNTER — Encounter (HOSPITAL_BASED_OUTPATIENT_CLINIC_OR_DEPARTMENT_OTHER): Payer: Self-pay | Admitting: Cardiology

## 2021-09-10 ENCOUNTER — Inpatient Hospital Stay: Payer: 59 | Attending: Physician Assistant

## 2021-09-10 ENCOUNTER — Ambulatory Visit (INDEPENDENT_AMBULATORY_CARE_PROVIDER_SITE_OTHER): Payer: 59

## 2021-09-10 ENCOUNTER — Other Ambulatory Visit: Payer: Self-pay

## 2021-09-10 VITALS — BP 148/71 | HR 75 | Temp 97.6°F | Resp 15 | Ht 70.0 in | Wt 169.2 lb

## 2021-09-10 DIAGNOSIS — Z7982 Long term (current) use of aspirin: Secondary | ICD-10-CM | POA: Diagnosis not present

## 2021-09-10 DIAGNOSIS — Z8673 Personal history of transient ischemic attack (TIA), and cerebral infarction without residual deficits: Secondary | ICD-10-CM

## 2021-09-10 DIAGNOSIS — D75839 Thrombocytosis, unspecified: Secondary | ICD-10-CM | POA: Diagnosis present

## 2021-09-10 DIAGNOSIS — D473 Essential (hemorrhagic) thrombocythemia: Secondary | ICD-10-CM | POA: Diagnosis not present

## 2021-09-10 DIAGNOSIS — Z1589 Genetic susceptibility to other disease: Secondary | ICD-10-CM | POA: Diagnosis not present

## 2021-09-10 LAB — CMP (CANCER CENTER ONLY)
ALT: 22 U/L (ref 0–44)
AST: 29 U/L (ref 15–41)
Albumin: 4 g/dL (ref 3.5–5.0)
Alkaline Phosphatase: 92 U/L (ref 38–126)
Anion gap: 5 (ref 5–15)
BUN: 27 mg/dL — ABNORMAL HIGH (ref 8–23)
CO2: 28 mmol/L (ref 22–32)
Calcium: 9.3 mg/dL (ref 8.9–10.3)
Chloride: 111 mmol/L (ref 98–111)
Creatinine: 2.06 mg/dL — ABNORMAL HIGH (ref 0.61–1.24)
GFR, Estimated: 33 mL/min — ABNORMAL LOW (ref >60)
Glucose, Bld: 96 mg/dL (ref 70–99)
Potassium: 4 mmol/L (ref 3.5–5.1)
Sodium: 144 mmol/L (ref 135–145)
Total Bilirubin: 0.4 mg/dL (ref 0.3–1.2)
Total Protein: 7 g/dL (ref 6.5–8.1)

## 2021-09-10 LAB — CBC WITH DIFFERENTIAL (CANCER CENTER ONLY)
Abs Immature Granulocytes: 0.04 10*3/uL (ref 0.00–0.07)
Basophils Absolute: 0.1 10*3/uL (ref 0.0–0.1)
Basophils Relative: 1 %
Eosinophils Absolute: 0.3 10*3/uL (ref 0.0–0.5)
Eosinophils Relative: 4 %
HCT: 45.3 % (ref 39.0–52.0)
Hemoglobin: 15 g/dL (ref 13.0–17.0)
Immature Granulocytes: 1 %
Lymphocytes Relative: 14 %
Lymphs Abs: 1 10*3/uL (ref 0.7–4.0)
MCH: 27.8 pg (ref 26.0–34.0)
MCHC: 33.1 g/dL (ref 30.0–36.0)
MCV: 84 fL (ref 80.0–100.0)
Monocytes Absolute: 0.7 10*3/uL (ref 0.1–1.0)
Monocytes Relative: 9 %
Neutro Abs: 5.6 10*3/uL (ref 1.7–7.7)
Neutrophils Relative %: 71 %
Platelet Count: 533 10*3/uL — ABNORMAL HIGH (ref 150–400)
RBC: 5.39 MIL/uL (ref 4.22–5.81)
RDW: 14.6 % (ref 11.5–15.5)
WBC Count: 7.6 10*3/uL (ref 4.0–10.5)
nRBC: 0 % (ref 0.0–0.2)

## 2021-09-10 NOTE — Progress Notes (Signed)
[facility] [phone]   [phone]  PROGRESS NOTE  Patient Care Team: Maximiano Coss, NP as PCP - General (Adult Health Nurse Practitioner) Buford Dresser, MD as PCP - Cardiology (Cardiology) Delorse Limber (Family Medicine) Buford Dresser, MD as Consulting Physician (Cardiology)  Hematological/Oncological History # Essential Thrombocytosis, JAK2 Positive  05/31/2021: establish care with Dr. Lorenso Courier for thrombocytosis. Found to be JAK2 positive.  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  The differential diagnosis includes essential thrombocythemia and early phase of primary myelofibrosis. 09/10/2021: start hydroxyurea 500 mg QOD. Was to start on 8/4 but patient was hesitant.   Interval History:  Gary [name] 76 y.o. male with medical history significant for essential thrombocytosis, JAK2 positive who presents for a follow up visit. The patient's last visit was on 05/31/2021. In the interim since the last visit he was informed of his diagnosis and plan moving forward on 08/09/2021 but did not start his hydroxyurea therapy due to concern for his kidney function and heart disease.  On exam today Gary [name] is accompanied by his wife.  He reports he continues to take aspirin 81 mg p.o. daily but is not taking hydroxyurea.  His wife is interested in having him take resveratrol instead of hydroxyurea.  She works for a company which sells resveratrol.  She notes that he also had a referral to nephrology which has not yet been fulfilled.  After discussion of the options moving forward he was agreeable to hydroxyurea every other day rather than on a daily basis.  We will start him at 500 mg q. OD.  Plan to have him return in 2 weeks for labs in 1 month for clinic visit.  He voices understanding of the plan moving forward and was agreeable.  He otherwise is not having any new  signs or symptoms concerning for VTE.  He denies any leg swelling, leg pain, chest pain, shortness of breath.  He otherwise denies any fevers, chills, sweats, nausea, vomiting or diarrhea.  Full 10 point ROS is listed below.  MEDICAL HISTORY:  Past Medical History:  Diagnosis Date   ADHD    Allergy    CKD (chronic kidney disease)    GERD (gastroesophageal reflux disease)    History of chickenpox    History of diverticulitis 2007   History of kidney stones    HTN (hypertension)    Reflux    Renal disorder    kidney stones   TBI (traumatic brain injury) ([place]) 08/11/2019   TBI (traumatic brain injury) ([place]) 08/11/2019   Trauma     SURGICAL HISTORY: Past Surgical History:  Procedure Laterality Date   LOOP RECORDER INSERTION N/A 04/26/2021   Procedure: LOOP RECORDER INSERTION;  Surgeon: Sanda Klein, MD;  Location: [facility];  Service: Cardiovascular;  Laterality: N/A;   MINOR REMOVAL OF MANDIBULAR HARDWARE N/A 12/15/2019   Procedure: REMOVAL OF RIGHT LATERAL ORBITAL MINIPLATE;  Surgeon: [name], DO;  Location: [facility];  Service: Plastics;  Laterality: N/A;   ORIF MANDIBULAR FRACTURE Bilateral 07/27/2019   Procedure: OPEN REDUCTION INTERNAL FIXATION (ORIF) OF COMPLEX ZYGOMATIC FRACTURE;  Surgeon: [name], DO;  Location: [facility];  Service: Plastics;  Laterality: Bilateral;  2 hours, please    SOCIAL HISTORY: Social History   Socioeconomic History   Marital status: Married    Spouse name: Braeton Wolgamott   Number of children: Not on file   Years of education: Not on  file   Highest education level: Not on file  Occupational History   Occupation: Warden/ranger    Comment: [employer]  Tobacco Use   Smoking status: Former    Types: Cigarettes   Smokeless tobacco: Never  Vaping Use   Vaping Use: Never used  Substance and Sexual Activity   Alcohol use: Not Currently   Drug use: Never   Sexual activity: Yes  Other Topics Concern   Not on  file  Social History Narrative   ** Merged History Encounter **       Social Determinants of Health   Financial Resource Strain: Not on file  Food Insecurity: Not on file  Transportation Needs: Not on file  Physical Activity: Not on file  Stress: Not on file  Social Connections: Not on file  Intimate Partner Violence: Not on file    FAMILY HISTORY: Family History  Problem Relation Age of Onset   Heart disease Mother    Hypertension Mother    Stroke Mother    Polycythemia Father    Polycythemia Brother    Leukemia Brother     ALLERGIES:  is allergic to levaquin [levofloxacin] and shellfish allergy.  MEDICATIONS:  Current Outpatient Medications  Medication Sig Dispense Refill   acetaminophen (TYLENOL) 325 MG tablet Take 1-2 tablets (325-650 mg total) by mouth every 4 (four) hours as needed for mild pain.     amLODipine (NORVASC) 2.5 MG tablet TAKE 1 TABLET(2.5 MG) BY MOUTH DAILY 30 tablet 11   amphetamine-dextroamphetamine (ADDERALL XR) 30 MG 24 hr capsule Take 1 capsule (30 mg total) by mouth daily. 30 capsule 0   amphetamine-dextroamphetamine (ADDERALL) 20 MG tablet Take 1 tablet (20 mg total) by mouth daily. 30 tablet 0   aspirin 81 MG EC tablet Take 1 tablet (81 mg total) by mouth daily. Swallow whole. 30 tablet 0   azelastine (ASTELIN) 0.1 % nasal spray Place 1 spray into both nostrils 2 (two) times daily. Use in each nostril as directed 30 mL 12   b complex vitamins capsule Take 1 capsule by mouth daily.     buPROPion (WELLBUTRIN XL) 300 MG 24 hr tablet TAKE 1 TABLET(300 MG) BY MOUTH DAILY 30 tablet 4   carvedilol (COREG) 3.125 MG tablet Take 1 tablet (3.125 mg total) by mouth 2 (two) times daily with a meal. 60 tablet 3   cetirizine (ZYRTEC) 10 MG tablet Take 1 tablet (10 mg total) by mouth daily. 30 tablet 11   diclofenac Sodium (VOLTAREN) 1 % GEL Apply 2 g topically 4 (four) times daily as needed (foot pain).     ezetimibe (ZETIA) 10 MG tablet Take 1 tablet (10 mg  total) by mouth daily. 90 tablet 3   finasteride (PROSCAR) 5 MG tablet Take 1 tablet (5 mg total) by mouth daily. 30 tablet 0   hydrALAZINE (APRESOLINE) 10 MG tablet Take 1 tablet (10 mg total) by mouth 3 (three) times daily. 90 tablet 11   isosorbide mononitrate (IMDUR) 30 MG 24 hr tablet Take 1 tablet (30 mg total) by mouth daily. 90 tablet 3   levETIRAcetam (KEPPRA) 500 MG tablet TAKE 1 TABLET(500 MG) BY MOUTH TWICE DAILY 30 tablet 2   Multiple Vitamin (MULTIVITAMIN WITH MINERALS) TABS tablet Take 1 tablet by mouth daily.     pantoprazole (PROTONIX) 40 MG tablet TAKE 1 TABLET(40 MG) BY MOUTH DAILY 90 tablet 3   rosuvastatin (CRESTOR) 20 MG tablet Take 1 tablet (20 mg total) by mouth daily. 30 tablet 3  traMADol (ULTRAM) 50 MG tablet Take 0.5-1 tablets (25-50 mg total) by mouth daily as needed. 30 tablet 2   hydroxyurea (HYDREA) 500 MG capsule Take 1 capsule (500 mg total) by mouth daily. May take with food to minimize GI side effects. (Patient not taking: Reported on 09/04/2021) 30 capsule 2   No current facility-administered medications for this visit.    REVIEW OF SYSTEMS:   Constitutional: ( - ) fevers, ( - )  chills , ( - ) night sweats Eyes: ( - ) blurriness of vision, ( - ) double vision, ( - ) watery eyes Ears, nose, mouth, throat, and face: ( - ) mucositis, ( - ) sore throat Respiratory: ( - ) cough, ( - ) dyspnea, ( - ) wheezes Cardiovascular: ( - ) palpitation, ( - ) chest discomfort, ( - ) lower extremity swelling Gastrointestinal:  ( - ) nausea, ( - ) heartburn, ( - ) change in bowel habits Skin: ( - ) abnormal skin rashes Lymphatics: ( - ) new lymphadenopathy, ( - ) easy bruising Neurological: ( - ) numbness, ( - ) tingling, ( - ) new weaknesses Behavioral/Psych: ( - ) mood change, ( - ) new changes  All other systems were reviewed with the patient and are negative.  PHYSICAL EXAMINATION:  Vitals:   09/10/21 1004  BP: (!) 148/71  Pulse: 75  Resp: 15  Temp: 97.6 F  (36.4 C)  SpO2: 98%   Filed Weights   09/10/21 1004  Weight: 169 lb 3.2 oz (76.7 kg)    GENERAL: Well-appearing elderly Caucasian male, alert, no distress and comfortable SKIN: skin color, texture, turgor are normal, no rashes or significant lesions EYES: conjunctiva are pink and non-injected, sclera clear LUNGS: clear to auscultation and percussion with normal breathing effort HEART: regular rate & rhythm and no murmurs and no lower extremity edema Musculoskeletal: no cyanosis of digits and no clubbing  PSYCH: alert & oriented x 3, fluent speech NEURO: no focal motor/sensory deficits  LABORATORY DATA:  I have reviewed the data as listed    Latest Ref Rng & Units 09/10/2021    9:26 AM 08/05/2021    8:02 AM 05/31/2021    1:21 PM  CBC  WBC 4.0 - 10.5 K/uL 7.6  7.3  7.4   Hemoglobin 13.0 - 17.0 g/dL 15.0  15.2  15.6   Hematocrit 39.0 - 52.0 % 45.3  42.6  47.3   Platelets 150 - 400 K/uL 533  412  592        Latest Ref Rng & Units 09/10/2021    9:26 AM 09/05/2021   10:27 AM 05/31/2021    1:21 PM  CMP  Glucose 70 - 99 mg/dL 96  93  93   BUN 8 - 23 mg/dL 27  25  33   Creatinine 0.61 - 1.24 mg/dL 2.06  2.09  2.09   Sodium 135 - 145 mmol/L 144  143  143   Potassium 3.5 - 5.1 mmol/L 4.0  4.3  4.6   Chloride 98 - 111 mmol/L 111  108  110   CO2 22 - 32 mmol/L _0 Calcium 8.9 - 10.3 mg/dL 9.3  9.3  9.8   Total Protein 6.5 - 8.1 g/dL 7.0  7.1  7.4   Total Bilirubin 0.3 - 1.2 mg/dL 0.4  0.4  0.4   Alkaline Phos 38 - 126 U/L 92  86  96   AST 15 - 41 U/L  _0 ALT 0 - 44 U/L _1 RADIOGRAPHIC STUDIES: No results found.  ASSESSMENT & PLAN Gary [name] 76 y.o. male with medical history significant for essential thrombocytosis, JAK2 positive who presents for a follow up visit.   # Essential Thrombocytosis, JAK2 Positive  --Diagnosis confirmed by bone marrow biopsy on 08/05/2021. --target Plt count <400. Today platelets are 533 --continue ASA 81 mg PO  daily --start Hydroxyurea 500 mg QOD. Do no recommend resveratrol as it is not a proven treatment for this condition.  --RTC in 4 weeks with interval 2 week labs   No orders of the defined types were placed in this encounter.   All questions were answered. The patient knows to call the clinic with any problems, questions or concerns.  A total of more than 30 minutes were spent on this encounter with face-to-face time and non-face-to-face time, including preparing to see the patient, ordering tests and/or medications, counseling the patient and coordination of care as outlined above.   Ledell Peoples, MD Department of Hematology/Oncology [place] [facility] Phone: [phone] Pager: [phone] Email: [name]  09/10/2021 2:06 PM

## 2021-09-11 ENCOUNTER — Telehealth: Payer: Self-pay | Admitting: Hematology and Oncology

## 2021-09-11 NOTE — Telephone Encounter (Signed)
Per 9/5 los called and spoke to pt about appointment

## 2021-09-17 LAB — CUP PACEART REMOTE DEVICE CHECK
Date Time Interrogation Session: 20230901130737
Implantable Pulse Generator Implant Date: 20230421

## 2021-09-23 ENCOUNTER — Other Ambulatory Visit: Payer: Self-pay | Admitting: Physical Medicine & Rehabilitation

## 2021-09-30 ENCOUNTER — Encounter: Payer: Self-pay | Admitting: Physical Medicine & Rehabilitation

## 2021-09-30 DIAGNOSIS — F900 Attention-deficit hyperactivity disorder, predominantly inattentive type: Secondary | ICD-10-CM

## 2021-09-30 DIAGNOSIS — Z87820 Personal history of traumatic brain injury: Secondary | ICD-10-CM

## 2021-10-01 ENCOUNTER — Other Ambulatory Visit: Payer: Self-pay | Admitting: *Deleted

## 2021-10-01 DIAGNOSIS — D473 Essential (hemorrhagic) thrombocythemia: Secondary | ICD-10-CM

## 2021-10-01 DIAGNOSIS — Z1589 Genetic susceptibility to other disease: Secondary | ICD-10-CM

## 2021-10-01 MED ORDER — AMPHETAMINE-DEXTROAMPHET ER 30 MG PO CP24: 30.0000 mg | ORAL_CAPSULE | Freq: Every day | ORAL | 0 refills | Status: DC

## 2021-10-01 MED ORDER — AMPHETAMINE-DEXTROAMPHETAMINE 20 MG PO TABS: 20.0000 mg | ORAL_TABLET | Freq: Every day | ORAL | 0 refills | Status: DC

## 2021-10-01 NOTE — Telephone Encounter (Signed)
Adderall rf'ed

## 2021-10-02 ENCOUNTER — Inpatient Hospital Stay: Payer: 59

## 2021-10-02 ENCOUNTER — Other Ambulatory Visit: Payer: Self-pay

## 2021-10-02 DIAGNOSIS — D473 Essential (hemorrhagic) thrombocythemia: Secondary | ICD-10-CM

## 2021-10-02 DIAGNOSIS — D75839 Thrombocytosis, unspecified: Secondary | ICD-10-CM | POA: Diagnosis not present

## 2021-10-02 DIAGNOSIS — Z1589 Genetic susceptibility to other disease: Secondary | ICD-10-CM

## 2021-10-02 LAB — CMP (CANCER CENTER ONLY)
ALT: 27 U/L (ref 0–44)
AST: 32 U/L (ref 15–41)
Albumin: 4 g/dL (ref 3.5–5.0)
Alkaline Phosphatase: 95 U/L (ref 38–126)
Anion gap: 6 (ref 5–15)
BUN: 22 mg/dL (ref 8–23)
CO2: 29 mmol/L (ref 22–32)
Calcium: 9.4 mg/dL (ref 8.9–10.3)
Chloride: 110 mmol/L (ref 98–111)
Creatinine: 2.06 mg/dL — ABNORMAL HIGH (ref 0.61–1.24)
GFR, Estimated: 33 mL/min — ABNORMAL LOW (ref >60)
Glucose, Bld: 122 mg/dL — ABNORMAL HIGH (ref 70–99)
Potassium: 3.7 mmol/L (ref 3.5–5.1)
Sodium: 145 mmol/L (ref 135–145)
Total Bilirubin: 0.5 mg/dL (ref 0.3–1.2)
Total Protein: 7.4 g/dL (ref 6.5–8.1)

## 2021-10-02 LAB — IRON AND IRON BINDING CAPACITY (CC-WL,HP ONLY)
Iron: 83 ug/dL (ref 45–182)
Saturation Ratios: 24 % (ref 17.9–39.5)
TIBC: 342 ug/dL (ref 250–450)
UIBC: 259 ug/dL (ref 117–376)

## 2021-10-02 LAB — CBC WITH DIFFERENTIAL (CANCER CENTER ONLY)
Abs Immature Granulocytes: 0.03 10*3/uL (ref 0.00–0.07)
Basophils Absolute: 0.1 10*3/uL (ref 0.0–0.1)
Basophils Relative: 1 %
Eosinophils Absolute: 0.2 10*3/uL (ref 0.0–0.5)
Eosinophils Relative: 3 %
HCT: 47.3 % (ref 39.0–52.0)
Hemoglobin: 15.9 g/dL (ref 13.0–17.0)
Immature Granulocytes: 0 %
Lymphocytes Relative: 13 %
Lymphs Abs: 0.9 10*3/uL (ref 0.7–4.0)
MCH: 28.5 pg (ref 26.0–34.0)
MCHC: 33.6 g/dL (ref 30.0–36.0)
MCV: 84.8 fL (ref 80.0–100.0)
Monocytes Absolute: 0.4 10*3/uL (ref 0.1–1.0)
Monocytes Relative: 6 %
Neutro Abs: 5.5 10*3/uL (ref 1.7–7.7)
Neutrophils Relative %: 77 %
Platelet Count: 461 10*3/uL — ABNORMAL HIGH (ref 150–400)
RBC: 5.58 MIL/uL (ref 4.22–5.81)
RDW: 15.1 % (ref 11.5–15.5)
WBC Count: 7.1 10*3/uL (ref 4.0–10.5)
nRBC: 0 % (ref 0.0–0.2)

## 2021-10-02 LAB — FERRITIN: Ferritin: 34 ng/mL (ref 24–336)

## 2021-10-02 MED ORDER — AMPHETAMINE-DEXTROAMPHETAMINE 10 MG PO TABS: 20.0000 mg | ORAL_TABLET | Freq: Every day | ORAL | 0 refills | Status: DC

## 2021-10-02 NOTE — Telephone Encounter (Signed)
Adjustment made

## 2021-10-02 NOTE — Progress Notes (Signed)
[facility] Summary Report / Loop Recorder 

## 2021-10-09 LAB — CUP PACEART REMOTE DEVICE CHECK
Date Time Interrogation Session: 20231004130818
Implantable Pulse Generator Implant Date: 20230421

## 2021-10-10 ENCOUNTER — Encounter: Payer: No Typology Code available for payment source | Attending: Psychology | Admitting: Psychology

## 2021-10-10 ENCOUNTER — Encounter: Payer: Self-pay | Admitting: Psychology

## 2021-10-10 DIAGNOSIS — G44329 Chronic post-traumatic headache, not intractable: Secondary | ICD-10-CM | POA: Diagnosis present

## 2021-10-10 DIAGNOSIS — G894 Chronic pain syndrome: Secondary | ICD-10-CM | POA: Insufficient documentation

## 2021-10-10 DIAGNOSIS — Z87820 Personal history of traumatic brain injury: Secondary | ICD-10-CM | POA: Insufficient documentation
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
## 2021-10-14 ENCOUNTER — Ambulatory Visit (INDEPENDENT_AMBULATORY_CARE_PROVIDER_SITE_OTHER): Payer: 59

## 2021-10-14 DIAGNOSIS — Z8673 Personal history of transient ischemic attack (TIA), and cerebral infarction without residual deficits: Secondary | ICD-10-CM | POA: Diagnosis not present

## 2021-10-15 ENCOUNTER — Other Ambulatory Visit: Payer: Self-pay

## 2021-10-15 ENCOUNTER — Other Ambulatory Visit: Payer: Self-pay | Admitting: Hematology and Oncology

## 2021-10-15 ENCOUNTER — Inpatient Hospital Stay (HOSPITAL_BASED_OUTPATIENT_CLINIC_OR_DEPARTMENT_OTHER): Payer: 59 | Admitting: Hematology and Oncology

## 2021-10-15 ENCOUNTER — Inpatient Hospital Stay: Payer: 59 | Attending: Physician Assistant

## 2021-10-15 DIAGNOSIS — D473 Essential (hemorrhagic) thrombocythemia: Secondary | ICD-10-CM | POA: Insufficient documentation

## 2021-10-15 DIAGNOSIS — D75839 Thrombocytosis, unspecified: Secondary | ICD-10-CM

## 2021-10-15 DIAGNOSIS — Z1589 Genetic susceptibility to other disease: Secondary | ICD-10-CM | POA: Diagnosis not present

## 2021-10-15 DIAGNOSIS — R5383 Other fatigue: Secondary | ICD-10-CM | POA: Insufficient documentation

## 2021-10-15 LAB — CBC WITH DIFFERENTIAL (CANCER CENTER ONLY)
Abs Immature Granulocytes: 0.03 10*3/uL (ref 0.00–0.07)
Basophils Absolute: 0 10*3/uL (ref 0.0–0.1)
Basophils Relative: 1 %
Eosinophils Absolute: 0.2 10*3/uL (ref 0.0–0.5)
Eosinophils Relative: 3 %
HCT: 48.2 % (ref 39.0–52.0)
Hemoglobin: 16 g/dL (ref 13.0–17.0)
Immature Granulocytes: 0 %
Lymphocytes Relative: 13 %
Lymphs Abs: 1 10*3/uL (ref 0.7–4.0)
MCH: 28.4 pg (ref 26.0–34.0)
MCHC: 33.2 g/dL (ref 30.0–36.0)
MCV: 85.5 fL (ref 80.0–100.0)
Monocytes Absolute: 0.6 10*3/uL (ref 0.1–1.0)
Monocytes Relative: 7 %
Neutro Abs: 5.9 10*3/uL (ref 1.7–7.7)
Neutrophils Relative %: 76 %
Platelet Count: 404 10*3/uL — ABNORMAL HIGH (ref 150–400)
RBC: 5.64 MIL/uL (ref 4.22–5.81)
RDW: 16.2 % — ABNORMAL HIGH (ref 11.5–15.5)
WBC Count: 7.7 10*3/uL (ref 4.0–10.5)
nRBC: 0 % (ref 0.0–0.2)

## 2021-10-15 LAB — CMP (CANCER CENTER ONLY)
ALT: 29 U/L (ref 0–44)
AST: 30 U/L (ref 15–41)
Albumin: 4 g/dL (ref 3.5–5.0)
Alkaline Phosphatase: 96 U/L (ref 38–126)
Anion gap: 4 — ABNORMAL LOW (ref 5–15)
BUN: 23 mg/dL (ref 8–23)
CO2: 30 mmol/L (ref 22–32)
Calcium: 9.3 mg/dL (ref 8.9–10.3)
Chloride: 109 mmol/L (ref 98–111)
Creatinine: 2.07 mg/dL — ABNORMAL HIGH (ref 0.61–1.24)
GFR, Estimated: 33 mL/min — ABNORMAL LOW (ref >60)
Glucose, Bld: 113 mg/dL — ABNORMAL HIGH (ref 70–99)
Potassium: 3.9 mmol/L (ref 3.5–5.1)
Sodium: 143 mmol/L (ref 135–145)
Total Bilirubin: 0.4 mg/dL (ref 0.3–1.2)
Total Protein: 7.5 g/dL (ref 6.5–8.1)

## 2021-10-15 MED ORDER — HYDROXYUREA 500 MG PO CAPS: 500.0000 mg | ORAL_CAPSULE | Freq: Every day | ORAL | 2 refills | Status: DC

## 2021-10-15 NOTE — Progress Notes (Signed)
[place] [phone]   [phone]  PROGRESS NOTE  Patient Care Team: Maximiano Coss, NP as PCP - General (Adult Health Nurse Practitioner) Buford Dresser, MD as PCP - Cardiology (Cardiology) Delorse Limber (Family Medicine) Buford Dresser, MD as Consulting Physician (Cardiology)  Hematological/Oncological History # Essential Thrombocytosis, JAK2 Positive  05/31/2021: establish care with Dr. Lorenso Courier for thrombocytosis. Found to be JAK2 positive.  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  The differential diagnosis includes essential thrombocythemia and early phase of primary myelofibrosis. 09/10/2021: start hydroxyurea 500 mg QOD. Was to start on 8/4 but patient was hesitant.  10/15/2021: WBC 7.7, Hgb 16.0, MCV 85.5, Plt 404  Interval History:  Gary [name] 76 y.o. male with medical history significant for essential thrombocytosis, JAK2 positive who presents for a follow up visit. The patient's last visit was on 09/10/2021. In the interim since the last visit he has started hydroxyurea QOD.   On exam today [name] is accompanied by his wife.  He reports he is tolerating the hydroxyurea medication every other day quite well.  He notes it is not causing any stomach upset or loose stools.  He notes his energy levels are quite low and he often feels sluggish and fatigued.  He reports his energy today is a 5 or 6 out of 10.  His appetite has been poor and he "does not eat well".  He notes that he does not have any mouth sores or sores on his ankles.  He is also not started any resveratrol therapy as his wife has been holding off to see how the hydroxyurea worked.  He otherwise is not having any new signs or symptoms concerning for VTE.  He denies any leg swelling, leg pain, chest pain, shortness of breath.  He otherwise denies any fevers, chills, sweats, nausea,  vomiting or diarrhea.  Full 10 point ROS is listed below.  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;  Service: Cardiovascular;  Laterality: N/A;   MINOR REMOVAL OF MANDIBULAR HARDWARE N/A 12/15/2019   Procedure: REMOVAL OF RIGHT LATERAL ORBITAL MINIPLATE;  Surgeon: [name], DO;  Location: [facility];  Service: Plastics;  Laterality: N/A;   ORIF MANDIBULAR FRACTURE Bilateral 07/27/2019   Procedure: OPEN REDUCTION INTERNAL FIXATION (ORIF) OF COMPLEX ZYGOMATIC FRACTURE;  Surgeon: [name], DO;  Location: [facility];  Service: Plastics;  Laterality: Bilateral;  2 hours, please    SOCIAL HISTORY: Social History   Socioeconomic History   Marital status: Married    Spouse name: Gary [name]   Number of children: Not on file   Years of education: Not on file   Highest education level: Not on file  Occupational History   Occupation: Warden/ranger    Comment: [employer]  Tobacco Use   Smoking status: Former    Types: Cigarettes   Smokeless tobacco: Never  Vaping Use   Vaping Use: Never used  Substance and Sexual Activity   Alcohol use: Not Currently   Drug use: Never   Sexual activity: Yes  Other Topics Concern   Not on file  Social History Narrative   ** Merged History Encounter **       Social Determinants of Health   Financial Resource Strain: Not on file  Food Insecurity:  Not on file  Transportation Needs: Not on file  Physical Activity: Not on file  Stress: Not on file  Social Connections: Not on file  Intimate Partner Violence: Not on file    FAMILY HISTORY: Family History  Problem Relation Age of Onset   Heart disease Mother    Hypertension Mother    Stroke Mother    Polycythemia Father    Polycythemia Brother    Leukemia Brother     ALLERGIES:  is allergic to levaquin [levofloxacin] and shellfish allergy.  MEDICATIONS:  Current Outpatient Medications  Medication Sig Dispense Refill   acetaminophen (TYLENOL) 325 MG tablet Take 1-2 tablets (325-650 mg total) by mouth every 4 (four) hours as needed for mild pain.     amLODipine (NORVASC) 2.5 MG tablet TAKE 1 TABLET(2.5 MG) BY MOUTH DAILY 30 tablet 11   amphetamine-dextroamphetamine (ADDERALL XR) 30 MG 24 hr capsule Take 1 capsule (30 mg total) by mouth daily. 30 capsule 0   amphetamine-dextroamphetamine (ADDERALL) 10 MG tablet Take 2 tablets (20 mg total) by mouth daily. 60 tablet 0   amphetamine-dextroamphetamine (ADDERALL) 20 MG tablet Take 1 tablet (20 mg total) by mouth daily. 30 tablet 0   aspirin 81 MG EC tablet Take 1 tablet (81 mg total) by mouth daily. Swallow whole. 30 tablet 0   azelastine (ASTELIN) 0.1 % nasal spray Place 1 spray into both nostrils 2 (two) times daily. Use in each nostril as directed 30 mL 12   b complex vitamins capsule Take 1 capsule by mouth daily.     buPROPion (WELLBUTRIN XL) 300 MG 24 hr tablet TAKE 1 TABLET(300 MG) BY MOUTH DAILY 30 tablet 4   carvedilol (COREG) 3.125 MG tablet TAKE 1 TABLET(3.125 MG) BY MOUTH TWICE DAILY WITH A MEAL 60 tablet 3   cetirizine (ZYRTEC) 10 MG tablet Take 1 tablet (10 mg total) by mouth daily. 30 tablet 11   diclofenac Sodium (VOLTAREN) 1 % GEL Apply 2 g topically 4 (four) times daily as needed (foot pain).     ezetimibe (ZETIA) 10 MG tablet Take 1 tablet (10 mg total) by mouth daily. 90 tablet 3   finasteride (PROSCAR) 5 MG tablet  Take 1 tablet (5 mg total) by mouth daily. 30 tablet 0   hydrALAZINE (APRESOLINE) 10 MG tablet Take 1 tablet (10 mg total) by mouth 3 (three) times daily. 90 tablet 11   hydroxyurea (HYDREA) 500 MG capsule Take 1 capsule (500 mg total) by mouth daily. May take with food to minimize GI side effects. 30 capsule 2   isosorbide mononitrate (IMDUR) 30 MG 24 hr tablet Take 1 tablet (30 mg total) by mouth daily. 90 tablet 3   levETIRAcetam (KEPPRA) 500 MG tablet TAKE 1 TABLET(500 MG) BY MOUTH TWICE DAILY 30 tablet 2   Multiple Vitamin (MULTIVITAMIN WITH MINERALS) TABS tablet Take 1 tablet by mouth daily.     pantoprazole (PROTONIX) 40 MG tablet TAKE 1 TABLET(40 MG) BY MOUTH DAILY 90 tablet 3  rosuvastatin (CRESTOR) 20 MG tablet TAKE 1 TABLET(20 MG) BY MOUTH DAILY 30 tablet 3   traMADol (ULTRAM) 50 MG tablet Take 0.5-1 tablets (25-50 mg total) by mouth daily as needed. 30 tablet 2   No current facility-administered medications for this visit.    REVIEW OF SYSTEMS:   Constitutional: ( - ) fevers, ( - )  chills , ( - ) night sweats Eyes: ( - ) blurriness of vision, ( - ) double vision, ( - ) watery eyes Ears, nose, mouth, throat, and face: ( - ) mucositis, ( - ) sore throat Respiratory: ( - ) cough, ( - ) dyspnea, ( - ) wheezes Cardiovascular: ( - ) palpitation, ( - ) chest discomfort, ( - ) lower extremity swelling Gastrointestinal:  ( - ) nausea, ( - ) heartburn, ( - ) change in bowel habits Skin: ( - ) abnormal skin rashes Lymphatics: ( - ) new lymphadenopathy, ( - ) easy bruising Neurological: ( - ) numbness, ( - ) tingling, ( - ) new weaknesses Behavioral/Psych: ( - ) mood change, ( - ) new changes  All other systems were reviewed with the patient and are negative.  PHYSICAL EXAMINATION:  There were no vitals filed for this visit.  There were no vitals filed for this visit.   GENERAL: Well-appearing elderly Caucasian male, alert, no distress and comfortable SKIN: skin color, texture,  turgor are normal, no rashes or significant lesions EYES: conjunctiva are pink and non-injected, sclera clear LUNGS: clear to auscultation and percussion with normal breathing effort HEART: regular rate & rhythm and no murmurs and no lower extremity edema Musculoskeletal: no cyanosis of digits and no clubbing  PSYCH: alert & oriented x 3, fluent speech NEURO: no focal motor/sensory deficits  LABORATORY DATA:  I have reviewed the data as listed    Latest Ref Rng & Units 10/15/2021   10:55 AM 10/02/2021   10:08 AM 09/10/2021    9:26 AM  CBC  WBC 4.0 - 10.5 K/uL 7.7  7.1  7.6   Hemoglobin 13.0 - 17.0 g/dL 16.0  15.9  15.0   Hematocrit 39.0 - 52.0 % 48.2  47.3  45.3   Platelets 150 - 400 K/uL 404  461  533        Latest Ref Rng & Units 10/15/2021   10:55 AM 10/02/2021   10:08 AM 09/10/2021    9:26 AM  CMP  Glucose 70 - 99 mg/dL 113  122  96   BUN 8 - 23 mg/dL 23  22  27    Creatinine 0.61 - 1.24 mg/dL 2.07  2.06  2.06   Sodium 135 - 145 mmol/L 143  145  144   Potassium 3.5 - 5.1 mmol/L 3.9  3.7  4.0   Chloride 98 - 111 mmol/L 109  110  111   CO2 22 - 32 mmol/L 30  29  28    Calcium 8.9 - 10.3 mg/dL 9.3  9.4  9.3   Total Protein 6.5 - 8.1 g/dL 7.5  7.4  7.0   Total Bilirubin 0.3 - 1.2 mg/dL 0.4  0.5  0.4   Alkaline Phos 38 - 126 U/L 96  95  92   AST 15 - 41 U/L 30  32  29   ALT 0 - 44 U/L 29  27  22      RADIOGRAPHIC STUDIES: CUP PACEART REMOTE DEVICE CHECK  Result Date: 10/09/2021 ILR summary report received. Battery status OK. Normal device function. No new  symptom, tachy, brady, or pause episodes. No new AF episodes. AF burden is 0% of the time.  Monthly summary reports and ROV/PRN [name] Breach, RN, CCDS, [employer]   ASSESSMENT & PLAN Ethen Bannan 76 y.o. male with medical history significant for essential thrombocytosis, JAK2 positive who presents for a follow up visit.   # Essential Thrombocytosis, JAK2 Positive  --Diagnosis confirmed by bone marrow biopsy on  08/05/2021. --target Plt count <400. Today platelets are 404 with WBC 7.7, Hgb 16.0, MCV 85.5 --continue ASA 81 mg PO daily --start Hydroxyurea 500 mg QOD. Do no recommend resveratrol as it is not a proven treatment for this condition.  --RTC in 4 weeks with interval 2 week labs   No orders of the defined types were placed in this encounter.   All questions were answered. The patient knows to call the clinic with any problems, questions or concerns.  A total of more than 25 minutes were spent on this encounter with face-to-face time and non-face-to-face time, including preparing to see the patient, ordering tests and/or medications, counseling the patient and coordination of care as outlined above.   Ledell Peoples, MD Department of Hematology/Oncology [place] at [facility] Phone: [phone] Pager: [phone] Email: [name]  10/15/2021 1:58 PM

## 2021-10-16 ENCOUNTER — Telehealth: Payer: Self-pay | Admitting: Hematology and Oncology

## 2021-10-16 NOTE — Telephone Encounter (Signed)
Per 10/10 los called and spoke to pt wife about appointments

## 2021-10-21 NOTE — Progress Notes (Unsigned)
[facility] [address] [place]. Gary [name] [address] [phone]       STROKE FOLLOW UP NOTE  Gary [name] Date of Birth:  August 21, 1945 Medical Record Number:  [id number]   Reason for Referral: stroke follow up    SUBJECTIVE:   CHIEF COMPLAINT:  No chief complaint on file.   HPI:    Update 10/22/2021 JM: Patient returns for 74-monthstroke follow-up.  Overall stable without new stroke/TIA symptoms.  Reports continued ***.   Remains on Keppra 500 mg twice daily, denies any recent seizure activity  Remains on aspirin 81 mg daily and Crestor.  Blood pressure well controlled.  Routinely follows with cardiology.  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       History provided for reference purposes only Initial visit 06/18/2021 JM: Patient is being seen for initial hospital follow-up accompanied by his wife.    Since his TBI in 2021 (working [employer] and [place] by [employer]), has had significant cognitive impairment with worsening since his stroke. Currently working with SLP but per wife, has not noticed much improvement.  Routinely follows with Dr. RSima [name]  Also working with PT/OT for balance and coordination which she believes has returned back to his baseline.  He also has diplopia post TBI, per patient no worsening since recent stroke.  Follows with ophthalmology and plans on starting vision therapy next week.  Use of cane when outdoors, no recent falls.  Tries to stay active walking a couple miles 5 days weekly.  Denies any specific residual deficits from recent stroke that he was not experiencing previously from his TBI  He has remained on Keppra 500 mg twice daily, denies side effects, denies any seizure activity.   Completed 3 weeks DAPT, remains on aspirin alone as well as Crestor, denies side effects.  Blood pressure  today 138/75.  Loop recorder has not shown atrial fibrillation thus far.  Has had follow-up with PCP RMaximiano Coss NP, PMR Dr. SNaaman Plummerand cardiologist Dr. CHarrell [name]  Plans on being seen by nephrology with hopes of being cleared to proceed with cardiac cath.  Also following with oncology for thrombocytosis with plans on undergoing bone marrow biopsy next month.  Wife questions stroke etiology. Does have hx of DVT with completion of Xarelto 2 to 3 months prior to his stroke.  Reports he was experiencing left foot pain with swelling and redness which he has not experienced since his stroke.  She questions if this could have been another clot that possibly contributed to his stroke.  She reports being seen by urgent care 2 days prior to his stroke with breathing concerns and was told likely due to allergies.  Also apparently had some breathing difficulties during admission with chest x-ray showing possible pneumonia and treated for such.  No further concerns at this time.   Stroke admission 04/23/2021 Mr. SNeftali [name] a 76y.o. male with history of hypertension, hyperlipidemia, CKD, TBI, mild cardiomyopathy, hx of DVT off Xarelto, frequent falls, cognitive impairment who presented on 04/23/2021 with possible seizure episode with aphasia and right facial droop.  CTH negative, received TNK.  MRI showed scattered left MCA small infarcts concerning for cardioembolic source given cardiomyopathy.  CTA head/neck unremarkable.  EF 35 to 40% with LV global hypokinesis (EF 40 to 45% 12/2020).  LE Doppler chronic left popliteal DVT.  Loop recorder placed.  LDL 136.  A1c  5.3.  Recommended DAPT for 3 weeks and aspirin alone as well as initiated Crestor 20 mg daily.  Advised outpatient follow-up with cardiology for cardiomyopathy.  Possible seizure activity on presentation with arm extension followed by shaking all over and confusion with aphasia and right facial droop, EEG and long-term EEG no seizure noted, did  show mild diffuse encephalopathy.  Initiated Keppra 500 mg twice daily.  Evaluated by therapies, discharged to CIR on 4/21 for therapy needs.       PERTINENT IMAGING  Per hospitalization 04/23/2021 CT head no acute abnormality MRI poor quality, questionable old left MCA subcortical punctate infarct CTA head and neck unremarkable MRI repeat scattered left MCA small infarcts 2D Echo EF 35 to 40%, LV global hypokinesis LE venous Doppler chronic left popliteal DVT Loop recorder placed 4/21  LDL 136 HgbA1c 5.3    ROS:   14 system review of systems performed and negative with exception of those listed in HPI  PMH:  Past Medical History:  Diagnosis Date   ADHD    Allergy    CKD (chronic kidney disease)    GERD (gastroesophageal reflux disease)    History of chickenpox    History of diverticulitis 2007   History of kidney stones    HTN (hypertension)    Reflux    Renal disorder    kidney stones   TBI (traumatic brain injury) ([place]) 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   Trauma     PSH:  Past Surgical History:  Procedure Laterality Date   LOOP RECORDER INSERTION N/A 04/26/2021   Procedure: LOOP RECORDER INSERTION;  Surgeon: Sanda Klein, MD;  Location: [facility];  Service: Cardiovascular;  Laterality: N/A;   MINOR REMOVAL OF MANDIBULAR HARDWARE N/A 12/15/2019   Procedure: REMOVAL OF RIGHT LATERAL ORBITAL MINIPLATE;  Surgeon: [name], DO;  Location: [facility];  Service: Plastics;  Laterality: N/A;   ORIF MANDIBULAR FRACTURE Bilateral 07/27/2019   Procedure: OPEN REDUCTION INTERNAL FIXATION (ORIF) OF COMPLEX ZYGOMATIC FRACTURE;  Surgeon: [name], DO;  Location: [facility];  Service: Plastics;  Laterality: Bilateral;  2 hours, please    Social History:  Social History   Socioeconomic History   Marital status: Married    Spouse name: Gary [name]   Number of children: Not on file   Years of education: Not on file    Highest education level: Not on file  Occupational History   Occupation: Warden/ranger    Comment: [employer]  Tobacco Use   Smoking status: Former    Types: Cigarettes   Smokeless tobacco: Never  Scientific laboratory technician Use: Never used  Substance and Sexual Activity   Alcohol use: Not Currently   Drug use: Never   Sexual activity: Yes  Other Topics Concern   Not on file  Social History Narrative   ** Merged History Encounter **       Social Determinants of Health   Financial Resource Strain: Not on file  Food Insecurity: Not on file  Transportation Needs: Not on file  Physical Activity: Not on file  Stress: Not on file  Social Connections: Not on file  Intimate Partner Violence: Not on file    Family History:  Family History  Problem Relation Age of Onset   Heart disease Mother    Hypertension Mother    Stroke Mother    Polycythemia Father    Polycythemia Brother    Leukemia Brother  Medications:   Current Outpatient Medications on File Prior to Visit  Medication Sig Dispense Refill   acetaminophen (TYLENOL) 325 MG tablet Take 1-2 tablets (325-650 mg total) by mouth every 4 (four) hours as needed for mild pain.     amLODipine (NORVASC) 2.5 MG tablet TAKE 1 TABLET(2.5 MG) BY MOUTH DAILY 30 tablet 11   amphetamine-dextroamphetamine (ADDERALL XR) 30 MG 24 hr capsule Take 1 capsule (30 mg total) by mouth daily. 30 capsule 0   amphetamine-dextroamphetamine (ADDERALL) 10 MG tablet Take 2 tablets (20 mg total) by mouth daily. 60 tablet 0   amphetamine-dextroamphetamine (ADDERALL) 20 MG tablet Take 1 tablet (20 mg total) by mouth daily. 30 tablet 0   aspirin 81 MG EC tablet Take 1 tablet (81 mg total) by mouth daily. Swallow whole. 30 tablet 0   azelastine (ASTELIN) 0.1 % nasal spray Place 1 spray into both nostrils 2 (two) times daily. Use in each nostril as directed 30 mL 12   b complex vitamins capsule Take 1 capsule by mouth daily.     buPROPion (WELLBUTRIN XL)  300 MG 24 hr tablet TAKE 1 TABLET(300 MG) BY MOUTH DAILY 30 tablet 4   carvedilol (COREG) 3.125 MG tablet TAKE 1 TABLET(3.125 MG) BY MOUTH TWICE DAILY WITH A MEAL 60 tablet 3   cetirizine (ZYRTEC) 10 MG tablet Take 1 tablet (10 mg total) by mouth daily. 30 tablet 11   diclofenac Sodium (VOLTAREN) 1 % GEL Apply 2 g topically 4 (four) times daily as needed (foot pain).     ezetimibe (ZETIA) 10 MG tablet Take 1 tablet (10 mg total) by mouth daily. 90 tablet 3   finasteride (PROSCAR) 5 MG tablet Take 1 tablet (5 mg total) by mouth daily. 30 tablet 0   hydrALAZINE (APRESOLINE) 10 MG tablet Take 1 tablet (10 mg total) by mouth 3 (three) times daily. 90 tablet 11   hydroxyurea (HYDREA) 500 MG capsule Take 1 capsule (500 mg total) by mouth daily. May take with food to minimize GI side effects. 30 capsule 2   isosorbide mononitrate (IMDUR) 30 MG 24 hr tablet Take 1 tablet (30 mg total) by mouth daily. 90 tablet 3   levETIRAcetam (KEPPRA) 500 MG tablet TAKE 1 TABLET(500 MG) BY MOUTH TWICE DAILY 30 tablet 2   Multiple Vitamin (MULTIVITAMIN WITH MINERALS) TABS tablet Take 1 tablet by mouth daily.     pantoprazole (PROTONIX) 40 MG tablet TAKE 1 TABLET(40 MG) BY MOUTH DAILY 90 tablet 3   rosuvastatin (CRESTOR) 20 MG tablet TAKE 1 TABLET(20 MG) BY MOUTH DAILY 30 tablet 3   traMADol (ULTRAM) 50 MG tablet Take 0.5-1 tablets (25-50 mg total) by mouth daily as needed. 30 tablet 2   No current facility-administered medications on file prior to visit.    Allergies:   Allergies  Allergen Reactions   Levaquin [Levofloxacin] Nausea And Vomiting   Shellfish Allergy Hives      OBJECTIVE:  Physical Exam  There were no vitals filed for this visit.  There is no height or weight on file to calculate BMI. No results found.   General: Frail pleasant elderly Caucasian male, seated, in no evident distress Head: head normocephalic and atraumatic.   Neck: supple with no carotid or supraclavicular  bruits Cardiovascular: regular rate and rhythm, no murmurs Musculoskeletal: no deformity Skin:  no [name]/petichiae Vascular:  Normal pulses all extremities   Neurologic Exam Mental Status: Awake and fully alert.  No evidence of dysarthria or aphasia.  Recent memory impaired  and remote memory intact. Attention span, concentration and fund of knowledge impaired with wife providing majority of history. Mood and affect flat.  Cranial Nerves: Pupils equal, briskly reactive to light. Extraocular movements full without nystagmus.  Left hypertropia (chronic).  Visual fields full to confrontation. Hearing intact. Facial sensation intact.  Mild left facial upper and lower weakness (chronic).  Tongue, palate moves normally and symmetrically.  Motor: Normal bulk and tone. Normal strength in all tested extremity muscles Sensory.: intact to touch , pinprick , position and vibratory sensation.  Coordination: Rapid alternating movements normal in all extremities. Finger-to-nose and heel-to-shin performed accurately bilaterally. Gait and Station: Arises from chair without difficulty. Stance is normal. Gait demonstrates normal stride length and mild imbalance with use of cane. Tandem walk and heel toe not attempted.  Reflexes: 1+ and symmetric. Toes downgoing.           ASSESSMENT: Gary [name] is a 76 y.o. year old male with left MCA scattered small infarcts Gary/p TNK on 04/23/2021 concerning for cardioembolic source given cardiomyopathy as well as likely seizure on presentation. 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      PLAN:  Left MCA stroke:  No new deficits from stroke. Worsening of cognition from TBI in 2021, questions some worsening of diplopia, improvement of balance currently at baseline.  Encourage continued participation with outpatient therapies and follow up with other specialty  providers as scheduled Loop recorder has not shown atrial fibrillation thus far Continue to follow with cardiology for cardiomyopathy Continue aspirin 81 mg daily  and Crestor for secondary stroke prevention.   Discussed secondary stroke prevention measures and importance of close PCP follow up for aggressive stroke risk factor management including BP goal<130/90 and HLD with LDL goal<70.  Stroke labs 04/2021: LDL 136, A1c 5.3 I have gone over the pathophysiology of stroke, warning signs and symptoms, risk factors and their management in some detail with instructions to go to the closest emergency room for symptoms of concern. Seizure in setting of acute stroke: Continue Keppra 500 mg twice daily.  Discussed increased risk of additional seizures in setting of recent stroke and history of TBI. Will plan on continuation of Keppra at this time.  Refill provided.     Follow up in 4 months or call earlier if needed   CC:  PCP: Gary Coss, NP    I spent 59 minutes of face-to-face and non-face-to-face time with patient and wife.  This included previsit chart review, lab review, study review, electronic health record documentation, patient and wife education regarding prior stroke including potential etiology and residual deficits, seizure and ongoing use of AED, secondary stroke prevention measures and importance of managing stroke risk factors, and answered all other questions to patient and wife'Gary satisfaction   Frann Rider, AGNP-BC  [facility] [address] [place], [place] [address]  Phone [phone] Fax [phone] Note: This document was prepared with digital dictation and possible smart phrase technology. Any transcriptional errors that result from this process are unintentional.

## 2021-10-22 ENCOUNTER — Ambulatory Visit (INDEPENDENT_AMBULATORY_CARE_PROVIDER_SITE_OTHER): Payer: 59 | Admitting: Adult Health

## 2021-10-22 ENCOUNTER — Encounter: Payer: Self-pay | Admitting: Adult Health

## 2021-10-22 VITALS — BP 153/80 | HR 78 | Ht 70.0 in | Wt 171.0 lb

## 2021-10-22 DIAGNOSIS — I639 Cerebral infarction, unspecified: Secondary | ICD-10-CM | POA: Diagnosis not present

## 2021-10-22 DIAGNOSIS — I63412 Cerebral infarction due to embolism of left middle cerebral artery: Secondary | ICD-10-CM

## 2021-10-22 DIAGNOSIS — R569 Unspecified convulsions: Secondary | ICD-10-CM

## 2021-10-22 MED ORDER — LEVETIRACETAM 500 MG PO TABS: 500.0000 mg | ORAL_TABLET | Freq: Two times a day (BID) | ORAL | 3 refills | Status: DC

## 2021-10-22 NOTE — Patient Instructions (Signed)
Continue Keppra 500 mg twice daily, please call office if staring episodes become more frequent or prolonged as it will be recommended to increase Keppra at that time  Continue aspirin 81 mg daily  and Crestor for secondary stroke prevention  Continue to follow up with PCP regarding cholesterol and blood pressure management  Maintain strict control of hypertension with blood pressure goal below 130/90 and cholesterol with LDL cholesterol (bad cholesterol) goal below 70 mg/dL.   Signs of a Stroke? Follow the BEFAST method:  Balance Watch for a sudden loss of balance, trouble with coordination or vertigo Eyes Is there a sudden loss of vision in one or both eyes? Or double vision?  Face: Ask the person to smile. Does one side of the face droop or is it numb?  Arms: Ask the person to raise both arms. Does one arm drift downward? Is there weakness or numbness of a leg? Speech: Ask the person to repeat a simple phrase. Does the speech sound slurred/strange? Is the person confused ? Time: If you observe any of these signs, call 911.    Followup in the future with me in 6 months or call earlier if needed       Thank you for coming to see [place] at [facility]. I hope we have been able to provide you high quality care today.  You may receive a patient satisfaction survey over the next few weeks. We would appreciate your feedback and comments so that we may continue to improve ourselves and the health of our patients.

## 2021-10-23 NOTE — Progress Notes (Signed)
[facility] Summary Report / Loop Recorder 

## 2021-10-31 ENCOUNTER — Encounter: Payer: Self-pay | Admitting: Physical Medicine & Rehabilitation

## 2021-11-13 ENCOUNTER — Other Ambulatory Visit: Payer: Self-pay | Admitting: Hematology and Oncology

## 2021-11-13 ENCOUNTER — Inpatient Hospital Stay: Payer: 59 | Attending: Physician Assistant

## 2021-11-13 DIAGNOSIS — D473 Essential (hemorrhagic) thrombocythemia: Secondary | ICD-10-CM | POA: Diagnosis present

## 2021-11-13 LAB — CBC WITH DIFFERENTIAL (CANCER CENTER ONLY)
Abs Immature Granulocytes: 0.03 10*3/uL (ref 0.00–0.07)
Basophils Absolute: 0.1 10*3/uL (ref 0.0–0.1)
Basophils Relative: 1 %
Eosinophils Absolute: 0.2 10*3/uL (ref 0.0–0.5)
Eosinophils Relative: 3 %
HCT: 44.9 % (ref 39.0–52.0)
Hemoglobin: 15.1 g/dL (ref 13.0–17.0)
Immature Granulocytes: 0 %
Lymphocytes Relative: 13 %
Lymphs Abs: 1 10*3/uL (ref 0.7–4.0)
MCH: 29.5 pg (ref 26.0–34.0)
MCHC: 33.6 g/dL (ref 30.0–36.0)
MCV: 87.9 fL (ref 80.0–100.0)
Monocytes Absolute: 0.5 10*3/uL (ref 0.1–1.0)
Monocytes Relative: 8 %
Neutro Abs: 5.4 10*3/uL (ref 1.7–7.7)
Neutrophils Relative %: 75 %
Platelet Count: 376 10*3/uL (ref 150–400)
RBC: 5.11 MIL/uL (ref 4.22–5.81)
RDW: 16.6 % — ABNORMAL HIGH (ref 11.5–15.5)
WBC Count: 7.2 10*3/uL (ref 4.0–10.5)
nRBC: 0 % (ref 0.0–0.2)

## 2021-11-13 LAB — CMP (CANCER CENTER ONLY)
ALT: 21 U/L (ref 0–44)
AST: 26 U/L (ref 15–41)
Albumin: 3.8 g/dL (ref 3.5–5.0)
Alkaline Phosphatase: 86 U/L (ref 38–126)
Anion gap: 6 (ref 5–15)
BUN: 23 mg/dL (ref 8–23)
CO2: 27 mmol/L (ref 22–32)
Calcium: 9.1 mg/dL (ref 8.9–10.3)
Chloride: 110 mmol/L (ref 98–111)
Creatinine: 1.96 mg/dL — ABNORMAL HIGH (ref 0.61–1.24)
GFR, Estimated: 35 mL/min — ABNORMAL LOW (ref >60)
Glucose, Bld: 101 mg/dL — ABNORMAL HIGH (ref 70–99)
Potassium: 4.4 mmol/L (ref 3.5–5.1)
Sodium: 143 mmol/L (ref 135–145)
Total Bilirubin: 0.5 mg/dL (ref 0.3–1.2)
Total Protein: 6.9 g/dL (ref 6.5–8.1)

## 2021-11-15 ENCOUNTER — Telehealth: Payer: Self-pay | Admitting: *Deleted

## 2021-11-15 NOTE — Telephone Encounter (Signed)
TCT patient regarding recent lab results. Spoke with pt's wife.  Advised  that his platelets are on target at 376.  We will plan to see him back as scheduled next month.  She voiced understanding.

## 2021-11-15 NOTE — Telephone Encounter (Signed)
-----   Message from Orson Slick, MD sent at 11/13/2021 10:57 AM EST ----- Please let Gary [name] know that his platelets are on target at 376.  We will plan to see him back as scheduled next month.  ----- Message ----- From: Interface, Lab In [place] Sent: 11/13/2021  10:21 AM EST To: Orson Slick, MD

## 2021-11-15 NOTE — Telephone Encounter (Signed)
-----   Message from Orson Slick, MD sent at 11/13/2021 10:57 AM EST ----- Please let Gary [name] know that his platelets are on target at 376.  We will plan to see him back as scheduled next month.  ----- Message ----- From: Interface, Lab In [place] Sent: 11/13/2021  10:21 AM EST To: Orson Slick, MD

## 2021-11-18 ENCOUNTER — Ambulatory Visit (INDEPENDENT_AMBULATORY_CARE_PROVIDER_SITE_OTHER): Payer: 59

## 2021-11-18 DIAGNOSIS — Z8673 Personal history of transient ischemic attack (TIA), and cerebral infarction without residual deficits: Secondary | ICD-10-CM | POA: Diagnosis not present

## 2021-11-19 LAB — CUP PACEART REMOTE DEVICE CHECK
Date Time Interrogation Session: 20231112231827
Implantable Pulse Generator Implant Date: 20230421

## 2021-11-20 ENCOUNTER — Ambulatory Visit (INDEPENDENT_AMBULATORY_CARE_PROVIDER_SITE_OTHER): Payer: 59 | Admitting: Cardiology

## 2021-11-20 ENCOUNTER — Encounter (HOSPITAL_BASED_OUTPATIENT_CLINIC_OR_DEPARTMENT_OTHER): Payer: Self-pay | Admitting: Cardiology

## 2021-11-20 VITALS — BP 128/70 | HR 75 | Ht 70.0 in | Wt 168.3 lb

## 2021-11-20 DIAGNOSIS — I5189 Other ill-defined heart diseases: Secondary | ICD-10-CM

## 2021-11-20 DIAGNOSIS — Z8673 Personal history of transient ischemic attack (TIA), and cerebral infarction without residual deficits: Secondary | ICD-10-CM

## 2021-11-20 DIAGNOSIS — Z87820 Personal history of traumatic brain injury: Secondary | ICD-10-CM | POA: Diagnosis not present

## 2021-11-20 DIAGNOSIS — I429 Cardiomyopathy, unspecified: Secondary | ICD-10-CM | POA: Diagnosis not present

## 2021-11-20 DIAGNOSIS — I63512 Cerebral infarction due to unspecified occlusion or stenosis of left middle cerebral artery: Secondary | ICD-10-CM

## 2021-11-20 DIAGNOSIS — R0609 Other forms of dyspnea: Secondary | ICD-10-CM | POA: Diagnosis not present

## 2021-11-20 DIAGNOSIS — N1832 Chronic kidney disease, stage 3b: Secondary | ICD-10-CM
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
## 2021-11-20 NOTE — Patient Instructions (Signed)
Medication Instructions:  Your physician recommends that you continue on your current medications as directed. Please refer to the Current Medication list given to you today.   *If you need a refill on your cardiac medications before your next appointment, please call your pharmacy*  Lab Work: NONE  Testing/Procedures: Your physician has requested that you have an echocardiogram. Echocardiography is a painless test that uses sound waves to create images of your heart. It provides your doctor with information about the size and shape of your heart and how well your heart's chambers and valves are working. This procedure takes approximately one hour. There are no restrictions for this procedure. Please do NOT wear cologne, perfume, aftershave, or lotions (deodorant is allowed). Please arrive 15 minutes prior to your appointment time.   Follow-Up: At [facility], you and your health needs are our priority.  As part of our continuing mission to provide you with exceptional heart care, we have created designated Provider Care Teams.  These Care Teams include your primary Cardiologist (physician) and Advanced Practice Providers (APPs -  Physician Assistants and Nurse Practitioners) who all work together to provide you with the care you need, when you need it.  We recommend signing up for the patient portal called "MyChart".  Sign up information is provided on this After Visit Summary.  MyChart is used to connect with patients for Virtual Visits (Telemedicine).  Patients are able to view lab/test results, encounter notes, upcoming appointments, etc.  Non-urgent messages can be sent to your provider as well.   To learn more about what you can do with MyChart, go to [email or web address].    Your next appointment:   3 month(s)  The format for your next appointment:   In Person  Provider:   Buford Dresser, MD

## 2021-12-01 ENCOUNTER — Encounter: Payer: Self-pay | Admitting: Physical Medicine & Rehabilitation

## 2021-12-01 DIAGNOSIS — F900 Attention-deficit hyperactivity disorder, predominantly inattentive type: Secondary | ICD-10-CM

## 2021-12-01 DIAGNOSIS — Z87820 Personal history of traumatic brain injury: Secondary | ICD-10-CM

## 2021-12-02 ENCOUNTER — Other Ambulatory Visit: Payer: Self-pay | Admitting: Nephrology

## 2021-12-02 DIAGNOSIS — N1832 Chronic kidney disease, stage 3b: Secondary | ICD-10-CM

## 2021-12-02 MED ORDER — AMPHETAMINE-DEXTROAMPHETAMINE 20 MG PO TABS: 20.0000 mg | ORAL_TABLET | Freq: Every day | ORAL | 0 refills | Status: DC

## 2021-12-02 MED ORDER — AMPHETAMINE-DEXTROAMPHET ER 30 MG PO CP24: 30.0000 mg | ORAL_CAPSULE | Freq: Every day | ORAL | 0 refills | Status: DC

## 2021-12-02 NOTE — Telephone Encounter (Signed)
Rxs written and sent to the pharmacy. Thanks!

## 2021-12-04 ENCOUNTER — Encounter: Payer: Self-pay | Admitting: Physical Medicine & Rehabilitation

## 2021-12-04 ENCOUNTER — Encounter
Payer: No Typology Code available for payment source | Attending: Psychology | Admitting: Physical Medicine & Rehabilitation

## 2021-12-04 VITALS — BP 156/78 | HR 89 | Ht 70.0 in | Wt 165.0 lb

## 2021-12-04 DIAGNOSIS — I7300 Raynaud's syndrome without gangrene: Secondary | ICD-10-CM

## 2021-12-04 DIAGNOSIS — F329 Major depressive disorder, single episode, unspecified: Secondary | ICD-10-CM | POA: Diagnosis not present

## 2021-12-04 DIAGNOSIS — M47816 Spondylosis without myelopathy or radiculopathy, lumbar region: Secondary | ICD-10-CM | POA: Diagnosis not present

## 2021-12-04 DIAGNOSIS — Z87820 Personal history of traumatic brain injury: Secondary | ICD-10-CM | POA: Diagnosis not present

## 2021-12-04 DIAGNOSIS — F900 Attention-deficit hyperactivity disorder, predominantly inattentive type: Secondary | ICD-10-CM

## 2021-12-04 MED ORDER — AMLODIPINE BESYLATE 5 MG PO TABS: 5.0000 mg | ORAL_TABLET | Freq: Every day | ORAL | 6 refills | Status: DC

## 2021-12-04 MED ORDER — DULOXETINE HCL 30 MG PO CPEP: 30.0000 mg | ORAL_CAPSULE | Freq: Every day | ORAL | 2 refills | Status: DC

## 2021-12-04 NOTE — Patient Instructions (Addendum)
ALWAYS FEEL FREE TO CALL OUR OFFICE WITH ANY PROBLEMS OR QUESTIONS ([phone])  **PLEASE NOTE** ALL MEDICATION REFILL REQUESTS (INCLUDING CONTROLLED SUBSTANCES) NEED TO BE MADE AT LEAST 7 DAYS PRIOR TO REFILL BEING DUE. ANY REFILL REQUESTS INSIDE THAT TIME FRAME MAY RESULT IN DELAYS IN RECEIVING YOUR PRESCRIPTION.   WEEK 1: WELLBUTRIN: TAKE EVERY OTHER DAY CYMBALTA '30MG'$ : ALTERNATE WITH WELLBUTRIN ABOVE  WEEK 2 (9 DAYS) 3 DAY ROTATION WELLBUTRIN DAY 1 CYMBALTA DAYS, 2 AND 3  WEEK 3 +  TAKE CYMBALTA '30MG'$  DAILY STOP WELLBUTRIN   Low Back Sprain or Strain Rehab Ask your health care provider which exercises are safe for you. Do exercises exactly as told by your health care provider and adjust them as directed. It is normal to feel mild stretching, pulling, tightness, or discomfort as you do these exercises. Stop right away if you feel sudden pain or your pain gets worse. Do not begin these exercises until told by your health care provider. Stretching and range-of-motion exercises These exercises warm up your muscles and joints and improve the movement and flexibility of your back. These exercises also help to relieve pain, numbness, and tingling. Lumbar rotation  Lie on your back on a firm bed or the floor with your knees bent. Straighten your arms out to your sides so each arm forms a 90-degree angle (right angle) with a side of your body. Slowly move (rotate) both of your knees to one side of your body until you feel a stretch in your lower back (lumbar). Try not to let your shoulders lift off the floor. Hold this position for __________ seconds. Tense your abdominal muscles and slowly move your knees back to the starting position. Repeat this exercise on the other side of your body. Repeat __________ times. Complete this exercise __________ times a day. Single knee to chest  Lie on your back on a firm bed or the floor with both legs straight. Bend one of your knees. Use your hands  to move your knee up toward your chest until you feel a gentle stretch in your lower back and buttock. Hold your leg in this position by holding on to the front of your knee. Keep your other leg as straight as possible. Hold this position for __________ seconds. Slowly return to the starting position. Repeat with your other leg. Repeat __________ times. Complete this exercise __________ times a day. Prone extension on elbows  Lie on your abdomen on a firm bed or the floor (prone position). Prop yourself up on your elbows. Use your arms to help lift your chest up until you feel a gentle stretch in your abdomen and your lower back. This will place some of your body weight on your elbows. If this is uncomfortable, try stacking pillows under your chest. Your hips should stay down, against the surface that you are lying on. Keep your hip and back muscles relaxed. Hold this position for __________ seconds. Slowly relax your upper body and return to the starting position. Repeat __________ times. Complete this exercise __________ times a day. Strengthening exercises These exercises build strength and endurance in your back. Endurance is the ability to use your muscles for a long time, even after they get tired. Pelvic tilt This exercise strengthens the muscles that lie deep in the abdomen. Lie on your back on a firm bed or the floor with your legs extended. Bend your knees so they are pointing toward the ceiling and your feet are flat on the floor. Tighten  your lower abdominal muscles to press your lower back against the floor. This motion will tilt your pelvis so your tailbone points up toward the ceiling instead of pointing to your feet or the floor. To help with this exercise, you may place a small towel under your lower back and try to push your back into the towel. Hold this position for __________ seconds. Let your muscles relax completely before you repeat this exercise. Repeat __________  times. Complete this exercise __________ times a day. Alternating arm and leg raises  Get on your hands and knees on a firm surface. If you are on a hard floor, you may want to use padding, such as an exercise mat, to cushion your knees. Line up your arms and legs. Your hands should be directly below your shoulders, and your knees should be directly below your hips. Lift your left leg behind you. At the same time, raise your right arm and straighten it in front of you. Do not lift your leg higher than your hip. Do not lift your arm higher than your shoulder. Keep your abdominal and back muscles tight. Keep your hips facing the ground. Do not arch your back. Keep your balance carefully, and do not hold your breath. Hold this position for __________ seconds. Slowly return to the starting position. Repeat with your right leg and your left arm. Repeat __________ times. Complete this exercise __________ times a day. Abdominal set with straight leg raise  Lie on your back on a firm bed or the floor. Bend one of your knees and keep your other leg straight. Tense your abdominal muscles and lift your straight leg up, 4-6 inches (10-15 cm) off the ground. Keep your abdominal muscles tight and hold this position for __________ seconds. Do not hold your breath. Do not arch your back. Keep it flat against the ground. Keep your abdominal muscles tense as you slowly lower your leg back to the starting position. Repeat with your other leg. Repeat __________ times. Complete this exercise __________ times a day. Single leg lower with bent knees Lie on your back on a firm bed or the floor. Tense your abdominal muscles and lift your feet off the floor, one foot at a time, so your knees and hips are bent in 90-degree angles (right angles). Your knees should be over your hips and your lower legs should be parallel to the floor. Keeping your abdominal muscles tense and your knee bent, slowly lower one of  your legs so your toe touches the ground. Lift your leg back up to return to the starting position. Do not hold your breath. Do not let your back arch. Keep your back flat against the ground. Repeat with your other leg. Repeat __________ times. Complete this exercise __________ times a day. Posture and body mechanics Good posture and healthy body mechanics can help to relieve stress in your body's tissues and joints. Body mechanics refers to the movements and positions of your body while you do your daily activities. Posture is part of body mechanics. Good posture means: Your spine is in its natural S-curve position (neutral). Your shoulders are pulled back slightly. Your head is not tipped forward (neutral). Follow these guidelines to improve your posture and body mechanics in your everyday activities. Standing  When standing, keep your spine neutral and your feet about hip-width apart. Keep a slight bend in your knees. Your ears, shoulders, and hips should line up. When you do a task in which you stand in  one place for a long time, place one foot up on a stable object that is 2-4 inches (5-10 cm) high, such as a footstool. This helps keep your spine neutral. Sitting  When sitting, keep your spine neutral and keep your feet flat on the floor. Use a footrest, if necessary, and keep your thighs parallel to the floor. Avoid rounding your shoulders, and avoid tilting your head forward. When working at a desk or a computer, keep your desk at a height where your hands are slightly lower than your elbows. Slide your chair under your desk so you are close enough to maintain good posture. When working at a computer, place your monitor at a height where you are looking straight ahead and you do not have to tilt your head forward or downward to look at the screen. Resting When lying down and resting, avoid positions that are most painful for you. If you have pain with activities such as sitting,  bending, stooping, or squatting, lie in a position in which your body does not bend very much. For example, avoid curling up on your side with your arms and knees near your chest (fetal position). If you have pain with activities such as standing for a long time or reaching with your arms, lie with your spine in a neutral position and bend your knees slightly. Try the following positions: Lying on your side with a pillow between your knees. Lying on your back with a pillow under your knees. Lifting  When lifting objects, keep your feet at least shoulder-width apart and tighten your abdominal muscles. Bend your knees and hips and keep your spine neutral. It is important to lift using the strength of your legs, not your back. Do not lock your knees straight out. Always ask for help to lift heavy or awkward objects. This information is not intended to replace advice given to you by your health care provider. Make sure you discuss any questions you have with your health care provider. Document Revised: 03/12/2020 Document Reviewed: 03/12/2020 Elsevier Patient Education  [place].

## 2021-12-04 NOTE — Progress Notes (Signed)
Subjective:    Patient ID: Gary [name], male    DOB: [birth year], 75 y.o.   MRN: [id number]  HPI  Gary [name] is here in follow up of his TBI and associated deficits. He complains of sl increase in low back pain. He's walking most days. He doesn't stretch much and doesn't know where his stretching exercises are  He tells me today that he feels depressed. He is having more arguments with his wife at home. They typically center around his ongoing/worsening cogniitve deficits. HIs wife states that he's spent 100's of hours on the computer which has been in effort to summate his illness and deficits which he plans to submit to the [place]. Wife feels that he's consumed with the computer and phone. He is more easily distracted  His wife states that the family has become weary of his behaviors and associated cognitive deficits. They feel he lacks awareness of his deficits and how he's interacting with others.  His sleep is fair. He often wakes up 2-3 x per night to urinate. He is on proscar '5mg'$  daily.    Pain Inventory Average Pain 5 Pain Right Now 0 My pain is constant and aching  LOCATION OF PAIN  lower back, left arm  BOWEL Number of stools per week: 7 plus Oral laxative use No    BLADDERNormal   Difficulty starting stream Yes  Incomplete bladder emptying No    Mobility use a cane how many minutes can you walk? unknown ability to climb steps?  yes do you drive?  no Do you have any goals in this area?  no  Function disabled: date disabled Worker Comp I need assistance with the following:  meal prep and household duties Do you have any goals in this area?  yes  Neuro/Psych bladder control problems tingling confusion depression anxiety loss of taste or smell  Prior Studies Any changes since last visit?  no  Physicians involved in your care Any changes since last visit?  yes Dr. Candiss Norse   Family History  Problem Relation Age of Onset   Heart disease Mother     Hypertension Mother    Stroke Mother    Polycythemia Father    Polycythemia Brother    Leukemia Brother    Social History   Socioeconomic History   Marital status: Married    Spouse name: Gary [name]   Number of children: Not on file   Years of education: Not on file   Highest education level: Not on file  Occupational History   Occupation: Warden/ranger    Comment: [employer]  Tobacco Use   Smoking status: Former    Types: Cigarettes   Smokeless tobacco: Never  Scientific laboratory technician Use: Never used  Substance and Sexual Activity   Alcohol use: Not Currently   Drug use: Never   Sexual activity: Yes  Other Topics Concern   Not on file  Social History Narrative   ** Merged History Encounter **       Social Determinants of Health   Financial Resource Strain: Not on file  Food Insecurity: Not on file  Transportation Needs: Not on file  Physical Activity: Not on file  Stress: Not on file  Social Connections: Not on file   Past Surgical History:  Procedure Laterality Date   LOOP RECORDER INSERTION N/A 04/26/2021   Procedure: Carrizo [name];  Surgeon: Sanda Klein, MD;  Location: [facility];  Service: Cardiovascular;  Laterality: N/A;  MINOR REMOVAL OF MANDIBULAR HARDWARE N/A 12/15/2019   Procedure: REMOVAL OF RIGHT LATERAL ORBITAL MINIPLATE;  Surgeon: [name], DO;  Location: [facility];  Service: Plastics;  Laterality: N/A;   ORIF MANDIBULAR FRACTURE Bilateral 07/27/2019   Procedure: OPEN REDUCTION INTERNAL FIXATION (ORIF) OF COMPLEX ZYGOMATIC FRACTURE;  Surgeon: [name], DO;  Location: [facility];  Service: Plastics;  Laterality: Bilateral;  2 hours, please   Past Medical History:  Diagnosis Date   ADHD    Allergy    CKD (chronic kidney disease)    GERD (gastroesophageal reflux disease)    History of chickenpox    History of diverticulitis 2007   History of kidney stones    HTN (hypertension)    Reflux    Renal disorder     kidney stones   TBI (traumatic brain injury) ([place]) 08/11/2019   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   Trauma    BP (!) 156/78   Pulse 89   Ht '5\' 10"'$  (1.778 m)   Wt 165 lb (74.8 kg)   SpO2 98%   BMI 23.68 kg/m   Opioid Risk Score:   Fall Risk Score:  `1  Depression screen [facility] 2/9     12/04/2021   10:55 AM 05/29/2021   11:07 AM 01/30/2021   11:37 AM 10/26/2020    9:44 AM 10/03/2020   11:40 AM 03/21/2020   10:11 AM 11/16/2019    9:08 AM  Depression screen PHQ 2/9  Decreased Interest 1 0  '1 2 1 '$ 0  Down, Depressed, Hopeless 1 0 '1 1 2 1 '$ 0  PHQ - 2 Score 2 0 '1 2 4 2 '$ 0  Altered sleeping    0     Tired, decreased energy    3     Change in appetite    0     Feeling bad or failure about yourself     0     Trouble concentrating    3     Moving slowly or fidgety/restless    2     Suicidal thoughts    0     PHQ-9 Score    10     Difficult doing work/chores    Somewhat difficult       Review of Systems  Genitourinary:  Positive for difficulty urinating.  Musculoskeletal:        Lower back pain   Neurological:        Tingling  Psychiatric/Behavioral:         Confusion, anxiety  All other systems reviewed and are negative.      Objective:   Physical Exam General: No acute distress HEENT: NCAT, EOMI, oral membranes moist Cards: reg rate  Chest: normal effort Abdomen: Soft, NT, ND Skin: dry, intact Extremities: no edema Psych: flat, but remains polite and appropriate  Skin: hands sl cool Neuro: Patient is alert and oriented to month but not the date or year.   He is able to follow basic commands.  He still lags in processing and still demonstrates some impairment in his insight and awareness.  Cranial nerve exam was somewhat nonfocal.  Strength is grossly 5 out of 5.     Gait was fairly steady.  No focal limb ataxia was seen.  No gross sensory abnormalities. Musculoskeletal: LB TTP. Fair posture in standing and with gait.          PLAN: 1. 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 His mood is definitely involved as well as the recent stroke.   2.  Antithrombotics: On ASA alone 3. Pain in toes/feet/fingers/raynaud like appearance as well as low back pain:  -?some improvement with amlodipine---increase to '5mg'$  -keep hands and feet warms, covered when possible.     - Tylenol to use for his low back,25-'50mg'$  qd prn -home stretching program was provided 4. 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   -Continue Adderall immediate release 20 mg daily in the afternoon -consider transition to methylphenidate             -continue f/u with Dr. 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 Insomnia             Seems improved, off trazodone 18. Mild thrombocytosis:             -Recent platelet count at the end of July was down to 4 and 12,000 -I think close observation is reasonable at this point   30 minutes of face to face patient care time were spent during this visit. All questions were encouraged and answered.  Case was discussed with WC case Freight forwarder.  Follow up with me in 2 MOS .

## 2021-12-10 ENCOUNTER — Inpatient Hospital Stay (HOSPITAL_BASED_OUTPATIENT_CLINIC_OR_DEPARTMENT_OTHER): Payer: 59 | Admitting: Hematology and Oncology

## 2021-12-10 ENCOUNTER — Inpatient Hospital Stay: Payer: 59 | Attending: Physician Assistant

## 2021-12-10 ENCOUNTER — Other Ambulatory Visit: Payer: Self-pay | Admitting: Hematology and Oncology

## 2021-12-10 ENCOUNTER — Other Ambulatory Visit: Payer: Self-pay

## 2021-12-10 VITALS — BP 151/71 | HR 74 | Temp 97.8°F | Resp 15 | Wt 164.9 lb

## 2021-12-10 DIAGNOSIS — Z1589 Genetic susceptibility to other disease: Secondary | ICD-10-CM | POA: Diagnosis not present

## 2021-12-10 DIAGNOSIS — D473 Essential (hemorrhagic) thrombocythemia: Secondary | ICD-10-CM

## 2021-12-10 DIAGNOSIS — Z87891 Personal history of nicotine dependence: Secondary | ICD-10-CM | POA: Insufficient documentation

## 2021-12-10 DIAGNOSIS — Z79899 Other long term (current) drug therapy: Secondary | ICD-10-CM | POA: Insufficient documentation

## 2021-12-10 DIAGNOSIS — D75839 Thrombocytosis, unspecified: Secondary | ICD-10-CM

## 2021-12-10 LAB — CMP (CANCER CENTER ONLY)
ALT: 31 U/L (ref 0–44)
AST: 33 U/L (ref 15–41)
Albumin: 3.9 g/dL (ref 3.5–5.0)
Alkaline Phosphatase: 93 U/L (ref 38–126)
Anion gap: 7 (ref 5–15)
BUN: 27 mg/dL — ABNORMAL HIGH (ref 8–23)
CO2: 26 mmol/L (ref 22–32)
Calcium: 9.4 mg/dL (ref 8.9–10.3)
Chloride: 111 mmol/L (ref 98–111)
Creatinine: 1.85 mg/dL — ABNORMAL HIGH (ref 0.61–1.24)
GFR, Estimated: 38 mL/min — ABNORMAL LOW (ref >60)
Glucose, Bld: 116 mg/dL — ABNORMAL HIGH (ref 70–99)
Potassium: 3.6 mmol/L (ref 3.5–5.1)
Sodium: 144 mmol/L (ref 135–145)
Total Bilirubin: 0.5 mg/dL (ref 0.3–1.2)
Total Protein: 7.1 g/dL (ref 6.5–8.1)

## 2021-12-10 LAB — CBC WITH DIFFERENTIAL (CANCER CENTER ONLY)
Abs Immature Granulocytes: 0.02 10*3/uL (ref 0.00–0.07)
Basophils Absolute: 0.1 10*3/uL (ref 0.0–0.1)
Basophils Relative: 1 %
Eosinophils Absolute: 0.1 10*3/uL (ref 0.0–0.5)
Eosinophils Relative: 2 %
HCT: 45.7 % (ref 39.0–52.0)
Hemoglobin: 15.6 g/dL (ref 13.0–17.0)
Immature Granulocytes: 0 %
Lymphocytes Relative: 16 %
Lymphs Abs: 1 10*3/uL (ref 0.7–4.0)
MCH: 30.9 pg (ref 26.0–34.0)
MCHC: 34.1 g/dL (ref 30.0–36.0)
MCV: 90.5 fL (ref 80.0–100.0)
Monocytes Absolute: 0.5 10*3/uL (ref 0.1–1.0)
Monocytes Relative: 8 %
Neutro Abs: 4.5 10*3/uL (ref 1.7–7.7)
Neutrophils Relative %: 73 %
Platelet Count: 351 10*3/uL (ref 150–400)
RBC: 5.05 MIL/uL (ref 4.22–5.81)
RDW: 16.5 % — ABNORMAL HIGH (ref 11.5–15.5)
WBC Count: 6.1 10*3/uL (ref 4.0–10.5)
nRBC: 0 % (ref 0.0–0.2)

## 2021-12-10 MED ORDER — HYDROXYUREA 500 MG PO CAPS: 500.0000 mg | ORAL_CAPSULE | ORAL | 1 refills | Status: DC

## 2021-12-10 NOTE — Progress Notes (Signed)
[facility] Telephone:([phone]) [phone]   Fax:(336) [phone]  PROGRESS NOTE  Patient Care Team: Jolinda Croak, MD as PCP - General (Family Medicine) Buford Dresser, MD as PCP - Cardiology (Cardiology) Gary [name] (Family Medicine) Buford Dresser, MD as Consulting Physician (Cardiology)  Hematological/Oncological History # Essential Thrombocytosis, JAK2 Positive  05/31/2021: establish care with Dr. Lorenso Courier for thrombocytosis. Found to be JAK2 positive.  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  The differential diagnosis includes essential thrombocythemia and early phase of primary myelofibrosis. 09/10/2021: start hydroxyurea 500 mg QOD. Was to start on 8/4 but patient was hesitant.  10/15/2021: WBC 7.7, Hgb 16.0, MCV 85.5, Plt 404 12/10/2021: WBC 6.1, Hgb 15.6, MCV 90.5, Plt 351  Interval History:  Gary [name] 76 y.o. male with medical history significant for essential thrombocytosis, JAK2 positive who presents for a follow up visit. The patient's last visit was on 10/15/2021. In the interim since the last visit he has continued hydroxyurea QOD.   On exam today Gary [name] is accompanied by his wife.  He reports he has been doing all right in the interim since her last visit.  He reports he is had no major changes in his health.  He continues to take his hydroxyurea medication every other day.  It is not causing any stomach upset and no ulcers in the mouth, ankles.  He reports he is not having any trouble with bleeding or bruising.  He reports that he can walk about a mile and a half before he has to sit down.  Overall he is willing and able to proceed with hydroxyurea therapy at this point.  He denies any leg swelling, leg pain, chest pain, shortness of breath.  He otherwise denies any fevers, chills, sweats, nausea, vomiting or diarrhea.  Full 10 point ROS is listed  below.  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   Trauma     SURGICAL HISTORY: Past Surgical History:  Procedure Laterality Date   LOOP RECORDER INSERTION N/A 04/26/2021   Procedure: LOOP RECORDER INSERTION;  Surgeon: Sanda Klein, MD;  Location: [facility];  Service: Cardiovascular;  Laterality: N/A;   MINOR REMOVAL OF MANDIBULAR HARDWARE N/A 12/15/2019   Procedure: REMOVAL OF RIGHT LATERAL ORBITAL MINIPLATE;  Surgeon: [name], DO;  Location: [facility];  Service: Plastics;  Laterality: N/A;   ORIF MANDIBULAR FRACTURE Bilateral 07/27/2019   Procedure: OPEN REDUCTION INTERNAL FIXATION (ORIF) OF COMPLEX ZYGOMATIC FRACTURE;  Surgeon: [name], DO;  Location: [facility];  Service: Plastics;  Laterality: Bilateral;  2 hours, please    SOCIAL HISTORY: Social History   Socioeconomic History   Marital status: Married    Spouse name: Remon Quinto   Number of children: Not on file   Years of education: Not on file   Highest education level: Not on file  Occupational History   Occupation: Warden/ranger    Comment: [employer] [place]  Tobacco Use   Smoking status: Former    Types: Cigarettes  Smokeless tobacco: Never  Vaping Use   Vaping Use: Never used  Substance and Sexual Activity   Alcohol use: Not Currently   Drug use: Never   Sexual activity: Yes  Other Topics Concern   Not on file  Social History Narrative   ** Merged History Encounter **       Social Determinants of Health   Financial Resource Strain: Not on file  Food Insecurity: Not on file  Transportation Needs: Not on file   Physical Activity: Not on file  Stress: Not on file  Social Connections: Not on file  Intimate Partner Violence: Not on file    FAMILY HISTORY: Family History  Problem Relation Age of Onset   Heart disease Mother    Hypertension Mother    Stroke Mother    Polycythemia Father    Polycythemia Brother    Leukemia Brother     ALLERGIES:  is allergic to levaquin [levofloxacin], atomoxetine hcl, and shellfish allergy.  MEDICATIONS:  Current Outpatient Medications  Medication Sig Dispense Refill   acetaminophen (TYLENOL) 325 MG tablet Take 1-2 tablets (325-650 mg total) by mouth every 4 (four) hours as needed for mild pain.     amLODipine (NORVASC) 5 MG tablet Take 1 tablet (5 mg total) by mouth daily. 30 tablet 6   amphetamine-dextroamphetamine (ADDERALL XR) 30 MG 24 hr capsule Take 1 capsule (30 mg total) by mouth daily. Do Not Fill Before 12/27/21 30 capsule 0   amphetamine-dextroamphetamine (ADDERALL) 20 MG tablet Take 1 tablet (20 mg total) by mouth daily. 30 tablet 0   aspirin 81 MG EC tablet Take 1 tablet (81 mg total) by mouth daily. Swallow whole. 30 tablet 0   azelastine (ASTELIN) 0.1 % nasal spray Place 1 spray into both nostrils 2 (two) times daily. Use in each nostril as directed 30 mL 12   b complex vitamins capsule Take 1 capsule by mouth daily.     buPROPion (WELLBUTRIN XL) 300 MG 24 hr tablet TAKE 1 TABLET(300 MG) BY MOUTH DAILY 30 tablet 4   carvedilol (COREG) 3.125 MG tablet TAKE 1 TABLET(3.125 MG) BY MOUTH TWICE DAILY WITH A MEAL 60 tablet 3   cetirizine (ZYRTEC) 10 MG tablet Take 1 tablet (10 mg total) by mouth daily. 30 tablet 11   diclofenac Sodium (VOLTAREN) 1 % GEL Apply 2 g topically 4 (four) times daily as needed (foot pain).     DULoxetine (CYMBALTA) 30 MG capsule Take 1 capsule (30 mg total) by mouth daily. 30 capsule 2   ezetimibe (ZETIA) 10 MG tablet Take 1 tablet (10 mg total) by mouth daily. 90 tablet 3   finasteride (PROSCAR) 5 MG tablet Take 1 tablet  (5 mg total) by mouth daily. 30 tablet 0   hydrALAZINE (APRESOLINE) 10 MG tablet Take 1 tablet (10 mg total) by mouth 3 (three) times daily. 90 tablet 11   hydroxyurea (HYDREA) 500 MG capsule Take 1 capsule (500 mg total) by mouth every other day. May take with food to minimize GI side effects. 90 capsule 1   isosorbide mononitrate (IMDUR) 30 MG 24 hr tablet Take 1 tablet (30 mg total) by mouth daily. 90 tablet 3   levETIRAcetam (KEPPRA) 500 MG tablet Take 1 tablet (500 mg total) by mouth 2 (two) times daily. 180 tablet 3   Multiple Vitamin (MULTIVITAMIN WITH MINERALS) TABS tablet Take 1 tablet by mouth daily.     pantoprazole (PROTONIX) 40 MG tablet TAKE 1 TABLET(40 MG) BY MOUTH DAILY 90 tablet  3   rosuvastatin (CRESTOR) 20 MG tablet TAKE 1 TABLET(20 MG) BY MOUTH DAILY 30 tablet 3   traMADol (ULTRAM) 50 MG tablet Take 0.5-1 tablets (25-50 mg total) by mouth daily as needed. (Patient not taking: Reported on 12/10/2021) 30 tablet 2   No current facility-administered medications for this visit.    REVIEW OF SYSTEMS:   Constitutional: ( - ) fevers, ( - )  chills , ( - ) night sweats Eyes: ( - ) blurriness of vision, ( - ) double vision, ( - ) watery eyes Ears, nose, mouth, throat, and face: ( - ) mucositis, ( - ) sore throat Respiratory: ( - ) cough, ( - ) dyspnea, ( - ) wheezes Cardiovascular: ( - ) palpitation, ( - ) chest discomfort, ( - ) lower extremity swelling Gastrointestinal:  ( - ) nausea, ( - ) heartburn, ( - ) change in bowel habits Skin: ( - ) abnormal skin rashes Lymphatics: ( - ) new lymphadenopathy, ( - ) easy bruising Neurological: ( - ) numbness, ( - ) tingling, ( - ) new weaknesses Behavioral/Psych: ( - ) mood change, ( - ) new changes  All other systems were reviewed with the patient and are negative.  PHYSICAL EXAMINATION:  Vitals:   12/10/21 1046  BP: (!) 151/71  Pulse: 74  Resp: 15  Temp: 97.8 F (36.6 C)  SpO2: 99%    Filed Weights   12/10/21 1046  Weight:  164 lb 14.4 oz (74.8 kg)     GENERAL: Well-appearing elderly Caucasian male, alert, no distress and comfortable SKIN: skin color, texture, turgor are normal, no rashes or significant lesions EYES: conjunctiva are pink and non-injected, sclera clear LUNGS: clear to auscultation and percussion with normal breathing effort HEART: regular rate & rhythm and no murmurs and no lower extremity edema Musculoskeletal: no cyanosis of digits and no clubbing  PSYCH: alert & oriented x 3, fluent speech NEURO: no focal motor/sensory deficits  LABORATORY DATA:  I have reviewed the data as listed    Latest Ref Rng & Units 12/10/2021   10:23 AM 11/13/2021   10:09 AM 10/15/2021   10:55 AM  CBC  WBC 4.0 - 10.5 K/uL 6.1  7.2  7.7   Hemoglobin 13.0 - 17.0 g/dL 15.6  15.1  16.0   Hematocrit 39.0 - 52.0 % 45.7  44.9  48.2   Platelets 150 - 400 K/uL 351  376  404        Latest Ref Rng & Units 12/10/2021   10:23 AM 11/13/2021   10:09 AM 10/15/2021   10:55 AM  CMP  Glucose 70 - 99 mg/dL 116  101  113   BUN 8 - 23 mg/dL _0 Creatinine 0.61 - 1.24 mg/dL 1.85  1.96  2.07   Sodium 135 - 145 mmol/L 144  143  143   Potassium 3.5 - 5.1 mmol/L 3.6  4.4  3.9   Chloride 98 - 111 mmol/L 111  110  109   CO2 22 - 32 mmol/L _1 Calcium 8.9 - 10.3 mg/dL 9.4  9.1  9.3   Total Protein 6.5 - 8.1 g/dL 7.1  6.9  7.5   Total Bilirubin 0.3 - 1.2 mg/dL 0.5  0.5  0.4   Alkaline Phos 38 - 126 U/L 93  86  96   AST 15 - 41 U/L 33  26  30   ALT 0 - 44  U/L _0 RADIOGRAPHIC STUDIES: CUP PACEART REMOTE DEVICE CHECK  Result Date: 11/19/2021 ILR summary report received. Battery status OK. Normal device function. No new symptom, tachy, brady, or pause episodes. No new AF episodes. Monthly summary reports and ROV/PRN LA   ASSESSMENT & PLAN Gary [name] 76 y.o. male with medical history significant for essential thrombocytosis, JAK2 positive who presents for a follow up visit.   # Essential  Thrombocytosis, JAK2 Positive  --Diagnosis confirmed by bone marrow biopsy on 08/05/2021. --target Plt count <400. At target today. WBC 6.1, Hgb 15.6, MCV 90.5, Plt 351 --continue ASA 81 mg PO daily and Hydroxyurea 500 mg QOD.  --Do no recommend resveratrol as it is not a proven treatment for this condition. (Discussed with patient's family) --RTC in 12 weeks with interval 6 week labs   No orders of the defined types were placed in this encounter.   All questions were answered. The patient knows to call the clinic with any problems, questions or concerns.  A total of more than 25 minutes were spent on this encounter with face-to-face time and non-face-to-face time, including preparing to see the patient, ordering tests and/or medications, counseling the patient and coordination of care as outlined above.   Ledell Peoples, MD Department of Hematology/Oncology [place] [facility] Phone: [phone] Pager: [phone] Email: [name]  12/10/2021 2:15 PM

## 2021-12-11 ENCOUNTER — Other Ambulatory Visit: Payer: 59

## 2021-12-12 ENCOUNTER — Ambulatory Visit (INDEPENDENT_AMBULATORY_CARE_PROVIDER_SITE_OTHER): Payer: 59

## 2021-12-12 DIAGNOSIS — I429 Cardiomyopathy, unspecified: Secondary | ICD-10-CM

## 2021-12-12 DIAGNOSIS — R0609 Other forms of dyspnea: Secondary | ICD-10-CM | POA: Diagnosis not present

## 2021-12-12 LAB — ECHOCARDIOGRAM COMPLETE
Area-P 1/2: 3.37 cm2
MV M vel: 4.59 m/s
MV Peak grad: 84.3 mmHg
P 1/2 time: 524 msec
S' Lateral: 4.06 cm

## 2021-12-16 ENCOUNTER — Ambulatory Visit
Admission: RE | Admit: 2021-12-16 | Discharge: 2021-12-16 | Disposition: A | Discharge status: Home / Self Care | Payer: 59 | Source: Ambulatory Visit | Attending: Nephrology | Admitting: Nephrology

## 2021-12-16 DIAGNOSIS — N1832 Chronic kidney disease, stage 3b: Secondary | ICD-10-CM

## 2021-12-23 ENCOUNTER — Ambulatory Visit (INDEPENDENT_AMBULATORY_CARE_PROVIDER_SITE_OTHER): Payer: 59

## 2021-12-23 DIAGNOSIS — Z8673 Personal history of transient ischemic attack (TIA), and cerebral infarction without residual deficits: Secondary | ICD-10-CM

## 2021-12-24 LAB — CUP PACEART REMOTE DEVICE CHECK
Date Time Interrogation Session: 20231217231959
Implantable Pulse Generator Implant Date: 20230421

## 2021-12-31 ENCOUNTER — Encounter: Payer: Self-pay | Admitting: Physical Medicine & Rehabilitation

## 2021-12-31 DIAGNOSIS — F900 Attention-deficit hyperactivity disorder, predominantly inattentive type: Secondary | ICD-10-CM

## 2021-12-31 DIAGNOSIS — Z87820 Personal history of traumatic brain injury: Secondary | ICD-10-CM

## 2021-12-31 MED ORDER — AMPHETAMINE-DEXTROAMPHETAMINE 20 MG PO TABS: 20.0000 mg | ORAL_TABLET | Freq: Every day | ORAL | 0 refills | Status: DC

## 2021-12-31 MED ORDER — AMPHETAMINE-DEXTROAMPHET ER 30 MG PO CP24: 30.0000 mg | ORAL_CAPSULE | Freq: Every day | ORAL | 0 refills | Status: DC

## 2021-12-31 NOTE — Progress Notes (Signed)
[facility] Summary Report / Loop Recorder 

## 2022-01-22 ENCOUNTER — Inpatient Hospital Stay: Payer: 59 | Attending: Physician Assistant

## 2022-01-22 ENCOUNTER — Other Ambulatory Visit: Payer: Self-pay

## 2022-01-22 ENCOUNTER — Other Ambulatory Visit: Payer: Self-pay | Admitting: Hematology and Oncology

## 2022-01-22 ENCOUNTER — Telehealth: Payer: Self-pay

## 2022-01-22 DIAGNOSIS — D473 Essential (hemorrhagic) thrombocythemia: Secondary | ICD-10-CM

## 2022-01-22 LAB — CMP (CANCER CENTER ONLY)
ALT: 25 U/L (ref 0–44)
AST: 30 U/L (ref 15–41)
Albumin: 3.7 g/dL (ref 3.5–5.0)
Alkaline Phosphatase: 85 U/L (ref 38–126)
Anion gap: 4 — ABNORMAL LOW (ref 5–15)
BUN: 21 mg/dL (ref 8–23)
CO2: 30 mmol/L (ref 22–32)
Calcium: 9.3 mg/dL (ref 8.9–10.3)
Chloride: 110 mmol/L (ref 98–111)
Creatinine: 1.62 mg/dL — ABNORMAL HIGH (ref 0.61–1.24)
GFR, Estimated: 44 mL/min — ABNORMAL LOW (ref >60)
Glucose, Bld: 92 mg/dL (ref 70–99)
Potassium: 3.6 mmol/L (ref 3.5–5.1)
Sodium: 144 mmol/L (ref 135–145)
Total Bilirubin: 0.5 mg/dL (ref 0.3–1.2)
Total Protein: 6.7 g/dL (ref 6.5–8.1)

## 2022-01-22 LAB — CBC WITH DIFFERENTIAL (CANCER CENTER ONLY)
Abs Immature Granulocytes: 0.02 10*3/uL (ref 0.00–0.07)
Basophils Absolute: 0.1 10*3/uL (ref 0.0–0.1)
Basophils Relative: 1 %
Eosinophils Absolute: 0.2 10*3/uL (ref 0.0–0.5)
Eosinophils Relative: 3 %
HCT: 43.6 % (ref 39.0–52.0)
Hemoglobin: 14.9 g/dL (ref 13.0–17.0)
Immature Granulocytes: 0 %
Lymphocytes Relative: 19 %
Lymphs Abs: 1.1 10*3/uL (ref 0.7–4.0)
MCH: 31.8 pg (ref 26.0–34.0)
MCHC: 34.2 g/dL (ref 30.0–36.0)
MCV: 93 fL (ref 80.0–100.0)
Monocytes Absolute: 0.5 10*3/uL (ref 0.1–1.0)
Monocytes Relative: 8 %
Neutro Abs: 3.9 10*3/uL (ref 1.7–7.7)
Neutrophils Relative %: 69 %
Platelet Count: 353 10*3/uL (ref 150–400)
RBC: 4.69 MIL/uL (ref 4.22–5.81)
RDW: 15.1 % (ref 11.5–15.5)
WBC Count: 5.7 10*3/uL (ref 4.0–10.5)
nRBC: 0 % (ref 0.0–0.2)

## 2022-01-22 NOTE — Telephone Encounter (Addendum)
Called and left the following message on patient's VM. Will also send message to patient my-chart account. Encouraged patient to call back with questions or concerns.  ----- Message from Orson Slick, MD sent at 01/22/2022  4:10 PM EST ----- Please let Mr. Lamorte know that his platelets are currently 353 and on target.  Otherwise his labs look excellent with a normal hemoglobin, stable kidney function, and normal liver function.  ----- Message ----- From: Interface, Lab In [place] Sent: 01/22/2022  11:58 AM EST To: Orson Slick, MD

## 2022-01-27 ENCOUNTER — Ambulatory Visit: Payer: 59 | Attending: Cardiovascular Disease

## 2022-01-27 DIAGNOSIS — Z8673 Personal history of transient ischemic attack (TIA), and cerebral infarction without residual deficits: Secondary | ICD-10-CM

## 2022-01-28 ENCOUNTER — Other Ambulatory Visit: Payer: Self-pay | Admitting: Physical Medicine & Rehabilitation

## 2022-01-28 DIAGNOSIS — F329 Major depressive disorder, single episode, unspecified: Secondary | ICD-10-CM

## 2022-01-28 LAB — CUP PACEART REMOTE DEVICE CHECK
Date Time Interrogation Session: 20240119230559
Implantable Pulse Generator Implant Date: 20230421

## 2022-01-29 NOTE — Progress Notes (Signed)
[facility] Summary Report / Loop Recorder

## 2022-01-31 ENCOUNTER — Encounter: Payer: Self-pay | Admitting: Physical Medicine & Rehabilitation

## 2022-01-31 DIAGNOSIS — Z87820 Personal history of traumatic brain injury: Secondary | ICD-10-CM

## 2022-01-31 DIAGNOSIS — F900 Attention-deficit hyperactivity disorder, predominantly inattentive type: Secondary | ICD-10-CM

## 2022-01-31 MED ORDER — AMPHETAMINE-DEXTROAMPHETAMINE 20 MG PO TABS: 20.0000 mg | ORAL_TABLET | Freq: Every day | ORAL | 0 refills | Status: DC

## 2022-01-31 MED ORDER — AMPHETAMINE-DEXTROAMPHET ER 30 MG PO CP24: 30.0000 mg | ORAL_CAPSULE | Freq: Every day | ORAL | 0 refills | Status: DC

## 2022-01-31 NOTE — Telephone Encounter (Signed)
Rx's sent for this month. Also sent rx'es for the end of next month. Make sure the [facility] are aware.  thx

## 2022-02-19 ENCOUNTER — Encounter
Payer: No Typology Code available for payment source | Attending: Psychology | Admitting: Physical Medicine & Rehabilitation

## 2022-02-19 ENCOUNTER — Encounter: Payer: Self-pay | Admitting: Physical Medicine & Rehabilitation

## 2022-02-19 VITALS — BP 151/79 | HR 80 | Ht 70.0 in | Wt 171.0 lb

## 2022-02-19 DIAGNOSIS — I7300 Raynaud's syndrome without gangrene: Secondary | ICD-10-CM | POA: Diagnosis not present

## 2022-02-19 DIAGNOSIS — S069XAS Unspecified intracranial injury with loss of consciousness status unknown, sequela: Secondary | ICD-10-CM | POA: Diagnosis present

## 2022-02-19 DIAGNOSIS — F900 Attention-deficit hyperactivity disorder, predominantly inattentive type: Secondary | ICD-10-CM | POA: Insufficient documentation

## 2022-02-19 DIAGNOSIS — F329 Major depressive disorder, single episode, unspecified: Secondary | ICD-10-CM | POA: Insufficient documentation

## 2022-02-19 DIAGNOSIS — G3189 Other specified degenerative diseases of nervous system: Secondary | ICD-10-CM | POA: Insufficient documentation

## 2022-02-19 DIAGNOSIS — F09 Unspecified mental disorder due to known physiological condition: Secondary | ICD-10-CM | POA: Insufficient documentation

## 2022-02-19 MED ORDER — DULOXETINE HCL 60 MG PO CPEP: 60.0000 mg | ORAL_CAPSULE | Freq: Every day | ORAL | 2 refills | Status: DC

## 2022-02-19 NOTE — Patient Instructions (Signed)
ALWAYS FEEL FREE TO CALL OUR OFFICE WITH ANY PROBLEMS OR QUESTIONS ([phone])  **PLEASE NOTE** ALL MEDICATION REFILL REQUESTS (INCLUDING CONTROLLED SUBSTANCES) NEED TO BE MADE AT LEAST 7 DAYS PRIOR TO REFILL BEING DUE. ANY REFILL REQUESTS INSIDE THAT TIME FRAME MAY RESULT IN DELAYS IN RECEIVING YOUR PRESCRIPTION.                    

## 2022-02-19 NOTE — Progress Notes (Signed)
Subjective:    Patient ID: Gary [name], male    DOB: 01/12/1945, 77 y.o.   MRN: [id number]  HPI  Gary [name] is here in follow-up of his brain injury.  He states that things are "about the same."  We made a switch to Cymbalta from his Wellbutrin without any obvious difficulties.  There has not been a substantial change in his mood.  He still feels that he is depressed at times.  He does see Dr. Sima Matas regularly but these visits are very far apart.  He is interested in seeing someone more frequently.  Sleep is fair when he does not have to wake up too much to use the bathroom.  He does try to void prior to going to bed.  He does report some improvement in his back and leg pain.  He is doing some of the stretches that we discussed which are helpful.  I asked him what he does from a activity standpoint he goes out with his aide often in the community to go to the store and to get lunch.  He also walks on a daily basis outside.  He remains on same doses of Adderall.  I asked him if he has any goals or aspirations for the future.  I asked him if he has any interests or hobbies that he wants to pursue and he really could not come up with anything.  He has been followed by ophthalmology for his eyes and they are recommending ongoing therapy at [facility]    Pain Inventory Average Pain 3 Pain Right Now 0 My pain is intermittent and aching  LOCATION OF PAIN  lower back  BOWEL Number of stools per week: 6-7   BLADDER Normal  Difficulty starting stream Yes    Mobility use a cane ability to climb steps?  yes do you drive?  no Do you have any goals in this area?  yes  Function employed # of hrs/week workes comp I need assistance with the following:  meal prep, household duties, and shopping Do you have any goals in this area?  yes  Neuro/Psych trouble walking dizziness confusion depression anxiety  Prior Studies Any changes since last visit?  no  Physicians  involved in your care Any changes since last visit?  no   Family History  Problem Relation Age of Onset   Heart disease Mother    Hypertension Mother    Stroke Mother    Polycythemia Father    Polycythemia Brother    Leukemia Brother    Social History   Socioeconomic History   Marital status: Married    Spouse name: Gary [name]   Number of children: Not on file   Years of education: Not on file   Highest education level: Not on file  Occupational History   Occupation: Warden/ranger    Comment: [employer] [place]  Tobacco Use   Smoking status: Former    Types: Cigarettes   Smokeless tobacco: Never  Scientific laboratory technician Use: Never used  Substance and Sexual Activity   Alcohol use: Not Currently   Drug use: Never   Sexual activity: Yes  Other Topics Concern   Not on file  Social History Narrative   ** Merged History Encounter **       Social Determinants of Health   Financial Resource Strain: Not on file  Food Insecurity: Not on file  Transportation Needs: Not on file  Physical Activity: Not on file  Stress: Not  on file  Social Connections: Not on file   Past Surgical History:  Procedure Laterality Date   LOOP RECORDER INSERTION N/A 04/26/2021   Procedure: LOOP RECORDER INSERTION;  Surgeon: Sanda Klein, MD;  Location: [facility];  Service: Cardiovascular;  Laterality: N/A;   MINOR REMOVAL OF MANDIBULAR HARDWARE N/A 12/15/2019   Procedure: REMOVAL OF RIGHT LATERAL ORBITAL MINIPLATE;  Surgeon: [name], DO;  Location: [facility];  Service: Plastics;  Laterality: N/A;   ORIF MANDIBULAR FRACTURE Bilateral 07/27/2019   Procedure: OPEN REDUCTION INTERNAL FIXATION (ORIF) OF COMPLEX ZYGOMATIC FRACTURE;  Surgeon: [name], DO;  Location: [facility];  Service: Plastics;  Laterality: Bilateral;  2 hours, please   Past Medical History:  Diagnosis Date   ADHD    Allergy    CKD (chronic kidney disease)    GERD (gastroesophageal reflux disease)     History of chickenpox    History of diverticulitis 2007   History of kidney stones    HTN (hypertension)    Reflux    Renal disorder    kidney stones   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   TBI (traumatic brain injury) ([facility]) 08/11/2019   Trauma    BP (!) 151/79   Pulse 80   Ht 5' 10"$  (1.778 m)   Wt 171 lb (77.6 kg)   SpO2 98%   BMI 24.54 kg/m   Opioid Risk Score:   Fall Risk Score:  `1  Depression screen PHQ 2/9     02/19/2022    1:28 PM 12/04/2021   10:55 AM 05/29/2021   11:07 AM 01/30/2021   11:37 AM 10/26/2020    9:44 AM 10/03/2020   11:40 AM 03/21/2020   10:11 AM  Depression screen PHQ 2/9  Decreased Interest 1 1 0  1 2 1  $ Down, Depressed, Hopeless 1 1 0 1 1 2 1  $ PHQ - 2 Score 2 2 0 1 2 4 2  $ Altered sleeping     0    Tired, decreased energy     3    Change in appetite     0    Feeling bad or failure about yourself      0    Trouble concentrating     3    Moving slowly or fidgety/restless     2    Suicidal thoughts     0    PHQ-9 Score     10    Difficult doing work/chores     Somewhat difficult      Review of Systems  Musculoskeletal:  Positive for back pain and gait problem.  Neurological:  Positive for headaches.  Psychiatric/Behavioral:  Positive for confusion.        Depression, anxiety  All other systems reviewed and are negative.      Objective:   Physical Exam  General: No acute distress HEENT: NCAT, EOMI, oral membranes moist Cards: reg rate  Chest: normal effort Abdomen: Soft, NT, ND Skin: dry, intact Extremities: no edema Psych: pleasant and appropriate, remains flat Skin: hands sl cool Neuro: Patient is alert and oriented to month but not the date or year.   He is able to follow basic commands.  He is still slow to process with some impairment in his insight and awareness although he is certainly competent.  Memory is fair.  Cranial nerve exam was somewhat nonfocal.  Strength is grossly 5 out of 5.     Gait was fairly steady.  No focal limb ataxia was seen.  No gross sensory abnormalities. Musculoskeletal: Improved lumbar range of motion.  Gait is fairly stable with his cane.         PLAN: 1. 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 Mood issues as well.    2.  Antithrombotics: On ASA alone 3. Pain in toes/feet/fingers/raynaud like appearance as well as low back pain:  -?some improvement with amlodipine---continue 68m -keep hands and feet warms, covered when possible.     - Tylenol to use for his low back -home stretching program should continue 4. Depression: LCSW to evaluate and provide emotional support             -reactive depression: increase cymbalta to 620mdaily              -keep Adderall XR to 30 mg daily mg daily.   -Continue Adderall immediate release 20 mg daily in the afternoon -consider transition to methylphenidate             -continue f/u with Dr. RoSima Matass scheduled  -would benefit from more frequent counseling in my opinion, every month or so so that he could develop better coping strategies and strategies for socialization mood management, etc.            -continue to exercise and socialize as possible.     -Discussed the fact that he needs to set some goals for the short-term at least.               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 Insomnia             Seems improved, off trazodone  -voiding before bed, PM "fluid rationing" 18. Mild thrombocytosis:             -Recent platelet count 353k!     20 minutes of face to face patient care time were spent during  this visit. All questions were encouraged and answered.  Case was discussed with WC case maFreight forwarder Follow up with me in 4 MOS.

## 2022-02-20 ENCOUNTER — Ambulatory Visit (INDEPENDENT_AMBULATORY_CARE_PROVIDER_SITE_OTHER): Payer: 59 | Admitting: Cardiology

## 2022-02-20 ENCOUNTER — Encounter (HOSPITAL_BASED_OUTPATIENT_CLINIC_OR_DEPARTMENT_OTHER): Payer: Self-pay | Admitting: Cardiology

## 2022-02-20 VITALS — BP 131/70 | HR 75 | Ht 70.0 in | Wt 173.1 lb

## 2022-02-20 DIAGNOSIS — R0609 Other forms of dyspnea: Secondary | ICD-10-CM

## 2022-02-20 DIAGNOSIS — I5189 Other ill-defined heart diseases: Secondary | ICD-10-CM

## 2022-02-20 DIAGNOSIS — R5382 Chronic fatigue, unspecified: Secondary | ICD-10-CM | POA: Diagnosis not present

## 2022-02-20 DIAGNOSIS — N1832 Chronic kidney disease, stage 3b: Secondary | ICD-10-CM

## 2022-02-20 DIAGNOSIS — Z87820 Personal history of traumatic brain injury: Secondary | ICD-10-CM

## 2022-02-20 DIAGNOSIS — Z8673 Personal history of transient ischemic attack (TIA), and cerebral infarction without residual deficits: Secondary | ICD-10-CM

## 2022-02-20 DIAGNOSIS — I429 Cardiomyopathy, unspecified: Secondary | ICD-10-CM | POA: Diagnosis not present
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
## 2022-02-20 NOTE — Patient Instructions (Signed)
Medication Instructions:  Your physician recommends that you continue on your current medications as directed. Please refer to the Current Medication list given to you today.   *If you need a refill on your cardiac medications before your next appointment, please call your pharmacy*  Lab Work: NONE  Testing/Procedures: NONE   Follow-Up: At [facility], you and your health needs are our priority.  As part of our continuing mission to provide you with exceptional heart care, we have created designated Provider Care Teams.  These Care Teams include your primary Cardiologist (physician) and Advanced Practice Providers (APPs -  Physician Assistants and Nurse Practitioners) who all work together to provide you with the care you need, when you need it.  We recommend signing up for the patient portal called "MyChart".  Sign up information is provided on this After Visit Summary.  MyChart is used to connect with patients for Virtual Visits (Telemedicine).  Patients are able to view lab/test results, encounter notes, upcoming appointments, etc.  Non-urgent messages can be sent to your provider as well.   To learn more about what you can do with MyChart, go to [email or web address].    Your next appointment:   6 month(s)  The format for your next appointment:   In Person  Provider:   Bridgette Christopher, MD             

## 2022-03-03 ENCOUNTER — Ambulatory Visit: Payer: 59

## 2022-03-03 DIAGNOSIS — Z8673 Personal history of transient ischemic attack (TIA), and cerebral infarction without residual deficits: Secondary | ICD-10-CM

## 2022-03-04 ENCOUNTER — Inpatient Hospital Stay: Payer: 59 | Attending: Physician Assistant

## 2022-03-04 ENCOUNTER — Inpatient Hospital Stay (HOSPITAL_BASED_OUTPATIENT_CLINIC_OR_DEPARTMENT_OTHER): Payer: 59 | Admitting: Hematology and Oncology

## 2022-03-04 ENCOUNTER — Other Ambulatory Visit: Payer: Self-pay | Admitting: Hematology and Oncology

## 2022-03-04 ENCOUNTER — Other Ambulatory Visit: Payer: Self-pay

## 2022-03-04 VITALS — BP 128/76 | HR 88 | Temp 97.8°F | Resp 15 | Wt 172.4 lb

## 2022-03-04 DIAGNOSIS — Z1589 Genetic susceptibility to other disease: Secondary | ICD-10-CM | POA: Diagnosis not present

## 2022-03-04 DIAGNOSIS — Z7982 Long term (current) use of aspirin: Secondary | ICD-10-CM | POA: Diagnosis not present

## 2022-03-04 DIAGNOSIS — Z87891 Personal history of nicotine dependence: Secondary | ICD-10-CM | POA: Insufficient documentation

## 2022-03-04 DIAGNOSIS — D473 Essential (hemorrhagic) thrombocythemia: Secondary | ICD-10-CM | POA: Insufficient documentation

## 2022-03-04 DIAGNOSIS — Z79899 Other long term (current) drug therapy: Secondary | ICD-10-CM | POA: Insufficient documentation

## 2022-03-04 LAB — CBC WITH DIFFERENTIAL (CANCER CENTER ONLY)
Abs Immature Granulocytes: 0.03 10*3/uL (ref 0.00–0.07)
Basophils Absolute: 0.1 10*3/uL (ref 0.0–0.1)
Basophils Relative: 1 %
Eosinophils Absolute: 0.2 10*3/uL (ref 0.0–0.5)
Eosinophils Relative: 3 %
HCT: 44.4 % (ref 39.0–52.0)
Hemoglobin: 15.3 g/dL (ref 13.0–17.0)
Immature Granulocytes: 1 %
Lymphocytes Relative: 18 %
Lymphs Abs: 1.2 10*3/uL (ref 0.7–4.0)
MCH: 32.7 pg (ref 26.0–34.0)
MCHC: 34.5 g/dL (ref 30.0–36.0)
MCV: 94.9 fL (ref 80.0–100.0)
Monocytes Absolute: 0.6 10*3/uL (ref 0.1–1.0)
Monocytes Relative: 9 %
Neutro Abs: 4.6 10*3/uL (ref 1.7–7.7)
Neutrophils Relative %: 68 %
Platelet Count: 391 10*3/uL (ref 150–400)
RBC: 4.68 MIL/uL (ref 4.22–5.81)
RDW: 13.4 % (ref 11.5–15.5)
WBC Count: 6.7 10*3/uL (ref 4.0–10.5)
nRBC: 0 % (ref 0.0–0.2)

## 2022-03-04 LAB — CUP PACEART REMOTE DEVICE CHECK
Date Time Interrogation Session: 20240221232011
Implantable Pulse Generator Implant Date: 20230421

## 2022-03-04 LAB — CMP (CANCER CENTER ONLY)
ALT: 25 U/L (ref 0–44)
AST: 31 U/L (ref 15–41)
Albumin: 4 g/dL (ref 3.5–5.0)
Alkaline Phosphatase: 87 U/L (ref 38–126)
Anion gap: 8 (ref 5–15)
BUN: 19 mg/dL (ref 8–23)
CO2: 27 mmol/L (ref 22–32)
Calcium: 8.8 mg/dL — ABNORMAL LOW (ref 8.9–10.3)
Chloride: 109 mmol/L (ref 98–111)
Creatinine: 1.79 mg/dL — ABNORMAL HIGH (ref 0.61–1.24)
GFR, Estimated: 39 mL/min — ABNORMAL LOW (ref >60)
Glucose, Bld: 112 mg/dL — ABNORMAL HIGH (ref 70–99)
Potassium: 4 mmol/L (ref 3.5–5.1)
Sodium: 144 mmol/L (ref 135–145)
Total Bilirubin: 0.5 mg/dL (ref 0.3–1.2)
Total Protein: 7.3 g/dL (ref 6.5–8.1)

## 2022-03-04 NOTE — Progress Notes (Signed)
[place] Telephone:([phone]) [phone]   [phone]  PROGRESS NOTE  Patient Care Team: Jolinda Croak, MD as PCP - General (Family Medicine) Buford Dresser, MD as PCP - Cardiology (Cardiology) Delorse Limber (Family Medicine) Buford Dresser, MD as Consulting Physician (Cardiology)  Hematological/Oncological History # Essential Thrombocytosis, JAK2 Positive  05/31/2021: establish care with Dr. Lorenso Courier for thrombocytosis. Found to be JAK2 positive.  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  The differential diagnosis includes essential thrombocythemia and early phase of primary myelofibrosis. 09/10/2021: start hydroxyurea 500 mg QOD. Was to start on 8/4 but patient was hesitant.  10/15/2021: WBC 7.7, Hgb 16.0, MCV 85.5, Plt 404 12/10/2021: WBC 6.1, Hgb 15.6, MCV 90.5, Plt 351  Interval History:  Gary [name] 77 y.o. male with medical history significant for essential thrombocytosis, JAK2 positive who presents for a follow up visit. The patient's last visit was on 12/10/2021. In the interim since the last visit he has continued hydroxyurea QOD.   On exam today Gary [name] is accompanied by his daughter.  He reports he is tolerating the hydroxyurea every other day quite well.  He reports that he is not having any changes in his health or new medications in the interim since her last visit.  He reports that he "does not sleep well".  He is not sure why this is.  He is not waking up with any shortness of breath or headache.  He reports that the hydroxyurea pills are not causing any nausea, vomiting, or diarrhea.  They are not causing any trouble that he can tell.  There are no ulcers in his mouth or on his legs.  He reports that he is not having any swelling of his lower extremities and is not having any bleeding, bruising, or dark stools on the baby aspirin.  He reports his energy  levels are good and his weight is stable.  Otherwise he is at his baseline level of health he denies any leg swelling, leg pain, chest pain, shortness of breath.  He otherwise denies any fevers, chills, sweats, nausea, vomiting or diarrhea.  Full 10 point ROS is listed below.  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   Trauma     SURGICAL HISTORY: Past Surgical History:  Procedure Laterality Date   LOOP RECORDER INSERTION N/A 04/26/2021   Procedure: LOOP RECORDER INSERTION;  Surgeon: Sanda Klein, MD;  Location: [facility];  Service: Cardiovascular;  Laterality: N/A;   MINOR REMOVAL OF MANDIBULAR HARDWARE N/A 12/15/2019   Procedure: REMOVAL OF RIGHT LATERAL ORBITAL MINIPLATE;  Surgeon: [name], DO;  Location: [name];  Service: Plastics;  Laterality: N/A;   ORIF MANDIBULAR FRACTURE Bilateral 07/27/2019   Procedure: OPEN REDUCTION INTERNAL FIXATION (ORIF) OF COMPLEX ZYGOMATIC FRACTURE;  Surgeon: [name], DO;  Location: [facility];  Service: Plastics;  Laterality: Bilateral;  2 hours, please    SOCIAL HISTORY: Social History   Socioeconomic History   Marital status: Married    Spouse name:  Edmon Crape   Number of children: Not on file   Years of education: Not on file   Highest education level: Not on file  Occupational History   Occupation: Warden/ranger    Comment: [employer]  Tobacco Use   Smoking status: Former    Types: Cigarettes   Smokeless tobacco: Never  Vaping Use   Vaping Use: Never used  Substance and Sexual Activity   Alcohol use: Not Currently   Drug use: Never    Sexual activity: Yes  Other Topics Concern   Not on file  Social History Narrative   ** Merged History Encounter **       Social Determinants of Health   Financial Resource Strain: Not on file  Food Insecurity: Not on file  Transportation Needs: Not on file  Physical Activity: Not on file  Stress: Not on file  Social Connections: Not on file  Intimate Partner Violence: Not on file    FAMILY HISTORY: Family History  Problem Relation Age of Onset   Heart disease Mother    Hypertension Mother    Stroke Mother    Polycythemia Father    Polycythemia Brother    Leukemia Brother     ALLERGIES:  is allergic to levaquin [levofloxacin], atomoxetine hcl, and shellfish allergy.  MEDICATIONS:  Current Outpatient Medications  Medication Sig Dispense Refill   acetaminophen (TYLENOL) 325 MG tablet Take 1-2 tablets (325-650 mg total) by mouth every 4 (four) hours as needed for mild pain.     amLODipine (NORVASC) 5 MG tablet Take 1 tablet (5 mg total) by mouth daily. 30 tablet 6   amphetamine-dextroamphetamine (ADDERALL XR) 30 MG 24 hr capsule Take 1 capsule (30 mg total) by mouth daily. 30 capsule 0   amphetamine-dextroamphetamine (ADDERALL) 20 MG tablet Take 1 tablet (20 mg total) by mouth daily. 30 tablet 0   aspirin 81 MG EC tablet Take 1 tablet (81 mg total) by mouth daily. Swallow whole. 30 tablet 0   azelastine (ASTELIN) 0.1 % nasal spray Place 1 spray into both nostrils 2 (two) times daily. Use in each nostril as directed 30 mL 12   b complex vitamins capsule Take 1 capsule by mouth daily.     carvedilol (COREG) 3.125 MG tablet TAKE 1 TABLET(3.125 MG) BY MOUTH TWICE DAILY WITH A MEAL 60 tablet 10   cetirizine (ZYRTEC) 10 MG tablet Take 1 tablet (10 mg total) by mouth daily. 30 tablet 11   DULoxetine (CYMBALTA) 60 MG capsule Take 1 capsule (60 mg total) by mouth daily. 30 capsule 2   ezetimibe (ZETIA) 10 MG tablet Take 1 tablet (10 mg total) by mouth daily. 90 tablet 3   finasteride  (PROSCAR) 5 MG tablet Take 1 tablet (5 mg total) by mouth daily. 30 tablet 0   hydrALAZINE (APRESOLINE) 10 MG tablet Take 1 tablet (10 mg total) by mouth 3 (three) times daily. 90 tablet 11   hydroxyurea (HYDREA) 500 MG capsule Take 1 capsule (500 mg total) by mouth every other day. May take with food to minimize GI side effects. 90 capsule 1   isosorbide mononitrate (IMDUR) 30 MG 24 hr tablet Take 1 tablet (30 mg total) by mouth daily. 90 tablet 3   levETIRAcetam (KEPPRA) 500 MG tablet Take 1 tablet (500 mg total) by mouth 2 (two) times daily. 180 tablet 3   Multiple Vitamin (MULTIVITAMIN WITH MINERALS) TABS tablet Take 1 tablet by mouth daily.     pantoprazole (PROTONIX) 40 MG tablet TAKE  1 TABLET(40 MG) BY MOUTH DAILY 90 tablet 3   rosuvastatin (CRESTOR) 20 MG tablet TAKE 1 TABLET(20 MG) BY MOUTH DAILY 30 tablet 10   No current facility-administered medications for this visit.    REVIEW OF SYSTEMS:   Constitutional: ( - ) fevers, ( - )  chills , ( - ) night sweats Eyes: ( - ) blurriness of vision, ( - ) double vision, ( - ) watery eyes Ears, nose, mouth, throat, and face: ( - ) mucositis, ( - ) sore throat Respiratory: ( - ) cough, ( - ) dyspnea, ( - ) wheezes Cardiovascular: ( - ) palpitation, ( - ) chest discomfort, ( - ) lower extremity swelling Gastrointestinal:  ( - ) nausea, ( - ) heartburn, ( - ) change in bowel habits Skin: ( - ) abnormal skin rashes Lymphatics: ( - ) new lymphadenopathy, ( - ) easy bruising Neurological: ( - ) numbness, ( - ) tingling, ( - ) new weaknesses Behavioral/Psych: ( - ) mood change, ( - ) new changes  All other systems were reviewed with the patient and are negative.  PHYSICAL EXAMINATION:  Vitals:   03/04/22 1047  BP: 128/76  Pulse: 88  Resp: 15  Temp: 97.8 F (36.6 C)  SpO2: 100%     Filed Weights   03/04/22 1047  Weight: 172 lb 6.4 oz (78.2 kg)      GENERAL: Well-appearing elderly Caucasian male, alert, no distress and  comfortable SKIN: skin color, texture, turgor are normal, no rashes or significant lesions EYES: conjunctiva are pink and non-injected, sclera clear LUNGS: clear to auscultation and percussion with normal breathing effort HEART: regular rate & rhythm and no murmurs and no lower extremity edema Musculoskeletal: no cyanosis of digits and no clubbing  PSYCH: alert & oriented x 3, fluent speech NEURO: no focal motor/sensory deficits  LABORATORY DATA:  I have reviewed the data as listed    Latest Ref Rng & Units 03/04/2022    9:39 AM 01/22/2022   11:18 AM 12/10/2021   10:23 AM  CBC  WBC 4.0 - 10.5 K/uL 6.7  5.7  6.1   Hemoglobin 13.0 - 17.0 g/dL 15.3  14.9  15.6   Hematocrit 39.0 - 52.0 % 44.4  43.6  45.7   Platelets 150 - 400 K/uL 391  353  351        Latest Ref Rng & Units 03/04/2022    9:39 AM 01/22/2022   11:18 AM 12/10/2021   10:23 AM  CMP  Glucose 70 - 99 mg/dL 112  92  116   BUN 8 - 23 mg/dL '19  21  27   '$ Creatinine 0.61 - 1.24 mg/dL 1.79  1.62  1.85   Sodium 135 - 145 mmol/L 144  144  144   Potassium 3.5 - 5.1 mmol/L 4.0  3.6  3.6   Chloride 98 - 111 mmol/L 109  110  111   CO2 22 - 32 mmol/L '27  30  26   '$ Calcium 8.9 - 10.3 mg/dL 8.8  9.3  9.4   Total Protein 6.5 - 8.1 g/dL 7.3  6.7  7.1   Total Bilirubin 0.3 - 1.2 mg/dL 0.5  0.5  0.5   Alkaline Phos 38 - 126 U/L 87  85  93   AST 15 - 41 U/L 31  30  33   ALT 0 - 44 U/L '25  25  31     '$ RADIOGRAPHIC STUDIES: CUP PACEART REMOTE DEVICE  CHECK  Result Date: 03/04/2022 ILR summary report received. Battery status OK. Normal device function. No new symptom, tachy, brady, or pause episodes. No new AF episodes. Monthly summary reports and ROV/PRN LA   ASSESSMENT & PLAN Gary [name] 77 y.o. male with medical history significant for essential thrombocytosis, JAK2 positive who presents for a follow up visit.   # Essential Thrombocytosis, JAK2 Positive  --Diagnosis confirmed by bone marrow biopsy on 08/05/2021. --target Plt count  <400. At target today. WBC 6.7, Hgb 15.3, MCV 94.9, Plt 391 --continue ASA 81 mg PO daily and Hydroxyurea 500 mg QOD.  --Do no recommend resveratrol as it is not a proven treatment for this condition. (Discussed with patient's family) --RTC in 37 weeks with labs.   No orders of the defined types were placed in this encounter.   All questions were answered. The patient knows to call the clinic with any problems, questions or concerns.  A total of more than 25 minutes were spent on this encounter with face-to-face time and non-face-to-face time, including preparing to see the patient, ordering tests and/or medications, counseling the patient and coordination of care as outlined above.   Ledell Peoples, MD Department of Hematology/Oncology [place] at [facility] Phone: [phone] Pager: [phone] Email: [name]  03/04/2022 3:22 PM

## 2022-03-10 ENCOUNTER — Other Ambulatory Visit: Payer: Self-pay | Admitting: Physical Medicine & Rehabilitation

## 2022-03-10 DIAGNOSIS — F329 Major depressive disorder, single episode, unspecified: Secondary | ICD-10-CM

## 2022-03-10 DIAGNOSIS — I7300 Raynaud's syndrome without gangrene: Secondary | ICD-10-CM

## 2022-03-11 ENCOUNTER — Telehealth: Payer: Self-pay | Admitting: Physical Medicine & Rehabilitation

## 2022-03-11 NOTE — Telephone Encounter (Signed)
Gary [name] patients case worker called requesting suggestion on psychologists, they tried [facility] [employer] but they do not work with worker's comp cases .

## 2022-03-15 NOTE — Progress Notes (Signed)
[facility] Summary Report / Loop Recorder 

## 2022-03-20 ENCOUNTER — Encounter: Payer: Self-pay | Admitting: Physical Medicine & Rehabilitation

## 2022-03-20 DIAGNOSIS — F900 Attention-deficit hyperactivity disorder, predominantly inattentive type: Secondary | ICD-10-CM

## 2022-03-20 DIAGNOSIS — Z87820 Personal history of traumatic brain injury: Secondary | ICD-10-CM

## 2022-03-21 MED ORDER — AMPHETAMINE-DEXTROAMPHET ER 30 MG PO CP24: 30.0000 mg | ORAL_CAPSULE | Freq: Every day | ORAL | 0 refills | Status: DC

## 2022-03-21 NOTE — Telephone Encounter (Signed)
[facility] sent [facility]

## 2022-03-23 ENCOUNTER — Encounter (HOSPITAL_BASED_OUTPATIENT_CLINIC_OR_DEPARTMENT_OTHER): Payer: Self-pay | Admitting: Cardiology

## 2022-03-27 ENCOUNTER — Telehealth: Payer: Self-pay | Admitting: Physical Medicine & Rehabilitation

## 2022-03-27 DIAGNOSIS — F09 Unspecified mental disorder due to known physiological condition: Secondary | ICD-10-CM

## 2022-03-27 DIAGNOSIS — F900 Attention-deficit hyperactivity disorder, predominantly inattentive type: Secondary | ICD-10-CM

## 2022-03-27 DIAGNOSIS — F329 Major depressive disorder, single episode, unspecified: Secondary | ICD-10-CM

## 2022-03-27 DIAGNOSIS — Z87820 Personal history of traumatic brain injury: Secondary | ICD-10-CM

## 2022-03-27 NOTE — Telephone Encounter (Signed)
The patients workers comp Tourist information centre manager came in to the office, and said that she tried to get the patient into psych at [facility] like you told her to but they don't accept workers comp. She wanted to know if you could recommend any others.

## 2022-03-28 NOTE — Telephone Encounter (Addendum)
Fx referral to [facility]  [phone]. Sent.

## 2022-04-07 ENCOUNTER — Ambulatory Visit (INDEPENDENT_AMBULATORY_CARE_PROVIDER_SITE_OTHER): Payer: 59

## 2022-04-07 DIAGNOSIS — Z8673 Personal history of transient ischemic attack (TIA), and cerebral infarction without residual deficits: Secondary | ICD-10-CM | POA: Diagnosis not present

## 2022-04-08 LAB — CUP PACEART REMOTE DEVICE CHECK
Date Time Interrogation Session: 20240331232235
Implantable Pulse Generator Implant Date: 20230421

## 2022-04-11 NOTE — Progress Notes (Signed)
[facility] Summary Report / Loop Recorder 

## 2022-04-17 ENCOUNTER — Ambulatory Visit: Payer: 59 | Admitting: Adult Health

## 2022-04-17 ENCOUNTER — Encounter: Payer: No Typology Code available for payment source | Admitting: Psychology

## 2022-04-22 ENCOUNTER — Encounter: Payer: No Typology Code available for payment source | Attending: Psychology | Admitting: Psychology

## 2022-04-22 DIAGNOSIS — S069XAS Unspecified intracranial injury with loss of consciousness status unknown, sequela: Secondary | ICD-10-CM | POA: Insufficient documentation

## 2022-04-22 DIAGNOSIS — G3189 Other specified degenerative diseases of nervous system: Secondary | ICD-10-CM | POA: Insufficient documentation

## 2022-04-22 DIAGNOSIS — Z87820 Personal history of traumatic brain injury: Secondary | ICD-10-CM | POA: Diagnosis present

## 2022-04-22 DIAGNOSIS — F329 Major depressive disorder, single episode, unspecified: Secondary | ICD-10-CM | POA: Insufficient documentation

## 2022-04-22 DIAGNOSIS — F09 Unspecified mental disorder due to known physiological condition: Secondary | ICD-10-CM | POA: Insufficient documentation

## 2022-04-23 ENCOUNTER — Ambulatory Visit: Payer: 59 | Admitting: Adult Health

## 2022-04-29 ENCOUNTER — Encounter: Payer: Self-pay | Admitting: Psychology
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
## 2022-05-09 ENCOUNTER — Telehealth (HOSPITAL_COMMUNITY): Payer: Self-pay | Admitting: Physical Medicine & Rehabilitation

## 2022-05-09 DIAGNOSIS — F900 Attention-deficit hyperactivity disorder, predominantly inattentive type: Secondary | ICD-10-CM

## 2022-05-09 DIAGNOSIS — Z87820 Personal history of traumatic brain injury: Secondary | ICD-10-CM

## 2022-05-09 MED ORDER — AMPHETAMINE-DEXTROAMPHETAMINE 20 MG PO TABS: 20.0000 mg | ORAL_TABLET | Freq: Every day | ORAL | 0 refills | Status: DC

## 2022-05-09 NOTE — Telephone Encounter (Signed)
Rx for adderall IR 20mg  sent to [facility]

## 2022-05-12 ENCOUNTER — Ambulatory Visit (INDEPENDENT_AMBULATORY_CARE_PROVIDER_SITE_OTHER): Payer: 59

## 2022-05-12 DIAGNOSIS — Z8673 Personal history of transient ischemic attack (TIA), and cerebral infarction without residual deficits: Secondary | ICD-10-CM

## 2022-05-12 LAB — CUP PACEART REMOTE DEVICE CHECK
Date Time Interrogation Session: 20240503231224
Implantable Pulse Generator Implant Date: 20230421

## 2022-05-14 NOTE — Progress Notes (Signed)
[facility] Summary Report / Loop Recorder 

## 2022-06-03 ENCOUNTER — Other Ambulatory Visit: Payer: Self-pay | Admitting: Physical Medicine & Rehabilitation

## 2022-06-03 DIAGNOSIS — I7300 Raynaud's syndrome without gangrene: Secondary | ICD-10-CM

## 2022-06-03 DIAGNOSIS — F329 Major depressive disorder, single episode, unspecified: Secondary | ICD-10-CM

## 2022-06-09 ENCOUNTER — Inpatient Hospital Stay: Payer: 59 | Admitting: Hematology and Oncology

## 2022-06-09 ENCOUNTER — Encounter: Payer: Self-pay | Admitting: Physical Medicine & Rehabilitation

## 2022-06-09 ENCOUNTER — Other Ambulatory Visit: Payer: Self-pay | Admitting: Hematology and Oncology

## 2022-06-09 ENCOUNTER — Inpatient Hospital Stay: Payer: 59 | Attending: Hematology and Oncology

## 2022-06-09 DIAGNOSIS — F900 Attention-deficit hyperactivity disorder, predominantly inattentive type: Secondary | ICD-10-CM

## 2022-06-09 DIAGNOSIS — Z87820 Personal history of traumatic brain injury: Secondary | ICD-10-CM

## 2022-06-09 DIAGNOSIS — D473 Essential (hemorrhagic) thrombocythemia: Secondary | ICD-10-CM | POA: Insufficient documentation

## 2022-06-09 DIAGNOSIS — R5383 Other fatigue: Secondary | ICD-10-CM | POA: Insufficient documentation

## 2022-06-09 DIAGNOSIS — D75839 Thrombocytosis, unspecified: Secondary | ICD-10-CM | POA: Insufficient documentation

## 2022-06-09 NOTE — Progress Notes (Unsigned)
[facility] [phone]   Fax:([phone]  PROGRESS NOTE  Patient Care Team: Stevphen Rochester, MD as PCP - General (Family Medicine) Jodelle Red, MD as PCP - Cardiology (Cardiology) Noel Journey (Family Medicine) Jodelle Red, MD as Consulting Physician (Cardiology)  Hematological/Oncological History # Essential Thrombocytosis, JAK2 Positive  05/31/2021: establish care with Dr. Leonides Schanz for thrombocytosis. Found to be JAK2 positive.  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  The differential diagnosis includes essential thrombocythemia and early phase of primary myelofibrosis. 09/10/2021: start hydroxyurea 500 mg QOD. Was to start on 8/4 but patient was hesitant.  10/15/2021: WBC 7.7, Hgb 16.0, MCV 85.5, Plt 404 12/10/2021: WBC 6.1, Hgb 15.6, MCV 90.5, Plt 351  Interval History:  Gary [name] 77 y.o. male with medical history significant for essential thrombocytosis, JAK2 positive who presents for a follow up visit. The patient's last visit was on 03/04/2022. In the interim since the last visit he has continued hydroxyurea QOD.   On exam today Gary [name] is accompanied by his daughter.  He reports ***  Otherwise he is at his baseline level of health he denies any leg swelling, leg pain, chest pain, shortness of breath.  He otherwise denies any fevers, chills, sweats, nausea, vomiting or diarrhea.  Full 10 point ROS is listed below.  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   Trauma     SURGICAL  HISTORY: Past Surgical History:  Procedure Laterality Date   LOOP RECORDER INSERTION N/A 04/26/2021   Procedure: LOOP RECORDER INSERTION;  Surgeon: Thurmon Fair, MD;  [facility];  Service: Cardiovascular;  Laterality: N/A;   MINOR REMOVAL OF MANDIBULAR HARDWARE N/A 12/15/2019   Procedure: REMOVAL OF RIGHT LATERAL ORBITAL MINIPLATE;  Surgeon: Peggye Form, DO;  Location: [facility] OR;  Service: Plastics;  Laterality: N/A;   ORIF MANDIBULAR FRACTURE Bilateral 07/27/2019   Procedure: OPEN REDUCTION INTERNAL FIXATION (ORIF) OF COMPLEX ZYGOMATIC FRACTURE;  Surgeon: Peggye Form, DO;  Location: [facility] OR;  Service: Plastics;  Laterality: Bilateral;  2 hours, please    SOCIAL HISTORY: Social History   Socioeconomic History   Marital status: Married    Spouse name: Ishan Giammanco   Number of children: Not on file   Years of education: Not on file   Highest education level: Not on file  Occupational History   Occupation: Quarry manager    Comment: [place] [employer] [employer]  Tobacco Use   Smoking status: Former    Types: Cigarettes   Smokeless tobacco: Never  Building services engineer Use: Never used  Substance and Sexual Activity   Alcohol use: Not Currently   Drug use: Never   Sexual activity: Yes  Other Topics Concern   Not on file  Social History Narrative   ** Merged History Encounter **       Social Determinants of Health   Financial Resource Strain: Not on file  Food Insecurity: Not on file  Transportation Needs: Not on file  Physical Activity:  Not on file  Stress: Not on file  Social Connections: Not on file  Intimate Partner Violence: Not on file    FAMILY HISTORY: Family History  Problem Relation Age of Onset   Heart disease Mother    Hypertension Mother    Stroke Mother    Polycythemia Father    Polycythemia Brother    Leukemia Brother     ALLERGIES:  is allergic to levaquin [levofloxacin], atomoxetine hcl, and shellfish allergy.  MEDICATIONS:   Current Outpatient Medications  Medication Sig Dispense Refill   acetaminophen (TYLENOL) 325 MG tablet Take 1-2 tablets (325-650 mg total) by mouth every 4 (four) hours as needed for mild pain.     amLODipine (NORVASC) 5 MG tablet Take 1 tablet (5 mg total) by mouth daily. 30 tablet 6   amphetamine-dextroamphetamine (ADDERALL XR) 30 MG 24 hr capsule Take 1 capsule (30 mg total) by mouth daily. 30 capsule 0   amphetamine-dextroamphetamine (ADDERALL) 20 MG tablet Take 1 tablet (20 mg total) by mouth daily. 30 tablet 0   aspirin 81 MG EC tablet Take 1 tablet (81 mg total) by mouth daily. Swallow whole. 30 tablet 0   azelastine (ASTELIN) 0.1 % nasal spray Place 1 spray into both nostrils 2 (two) times daily. Use in each nostril as directed 30 mL 12   b complex vitamins capsule Take 1 capsule by mouth daily.     carvedilol (COREG) 3.125 MG tablet TAKE 1 TABLET(3.125 MG) BY MOUTH TWICE DAILY WITH A MEAL 60 tablet 10   cetirizine (ZYRTEC) 10 MG tablet Take 1 tablet (10 mg total) by mouth daily. 30 tablet 11   DULoxetine (CYMBALTA) 60 MG capsule TAKE 1 CAPSULE(60 MG) BY MOUTH DAILY 30 capsule 6   ezetimibe (ZETIA) 10 MG tablet Take 1 tablet (10 mg total) by mouth daily. 90 tablet 3   finasteride (PROSCAR) 5 MG tablet Take 1 tablet (5 mg total) by mouth daily. 30 tablet 0   hydrALAZINE (APRESOLINE) 10 MG tablet Take 1 tablet (10 mg total) by mouth 3 (three) times daily. 90 tablet 11   hydroxyurea (HYDREA) 500 MG capsule Take 1 capsule (500 mg total) by mouth every other day. May take with food to minimize GI side effects. 90 capsule 1   isosorbide mononitrate (IMDUR) 30 MG 24 hr tablet Take 1 tablet (30 mg total) by mouth daily. 90 tablet 3   levETIRAcetam (KEPPRA) 500 MG tablet Take 1 tablet (500 mg total) by mouth 2 (two) times daily. 180 tablet 3   Multiple Vitamin (MULTIVITAMIN WITH MINERALS) TABS tablet Take 1 tablet by mouth daily.     pantoprazole (PROTONIX) 40 MG tablet TAKE 1 TABLET(40 MG) BY  MOUTH DAILY 90 tablet 3   rosuvastatin (CRESTOR) 20 MG tablet TAKE 1 TABLET(20 MG) BY MOUTH DAILY 30 tablet 10   No current facility-administered medications for this visit.    REVIEW OF SYSTEMS:   Constitutional: ( - ) fevers, ( - )  chills , ( - ) night sweats Eyes: ( - ) blurriness of vision, ( - ) double vision, ( - ) watery eyes Ears, nose, mouth, throat, and face: ( - ) mucositis, ( - ) sore throat Respiratory: ( - ) cough, ( - ) dyspnea, ( - ) wheezes Cardiovascular: ( - ) palpitation, ( - ) chest discomfort, ( - ) lower extremity swelling Gastrointestinal:  ( - ) nausea, ( - ) heartburn, ( - ) change in bowel habits Skin: ( - ) abnormal skin  rashes Lymphatics: ( - ) new lymphadenopathy, ( - ) easy bruising Neurological: ( - ) numbness, ( - ) tingling, ( - ) new weaknesses Behavioral/Psych: ( - ) mood change, ( - ) new changes  All other systems were reviewed with the patient and are negative.  PHYSICAL EXAMINATION:  There were no vitals filed for this visit.    There were no vitals filed for this visit.     GENERAL: Well-appearing elderly Caucasian male, alert, no distress and comfortable SKIN: skin color, texture, turgor are normal, no rashes or significant lesions EYES: conjunctiva are pink and non-injected, sclera clear LUNGS: clear to auscultation and percussion with normal breathing effort HEART: regular rate & rhythm and no murmurs and no lower extremity edema Musculoskeletal: no cyanosis of digits and no clubbing  PSYCH: alert & oriented x 3, fluent speech NEURO: no focal motor/sensory deficits  LABORATORY DATA:  I have reviewed the data as listed    Latest Ref Rng & Units 03/04/2022    9:39 AM 01/22/2022   11:18 AM 12/10/2021   10:23 AM  CBC  WBC 4.0 - 10.5 K/uL 6.7  5.7  6.1   Hemoglobin 13.0 - 17.0 g/dL 16.1  09.6  04.5   Hematocrit 39.0 - 52.0 % 44.4  43.6  45.7   Platelets 150 - 400 K/uL 391  353  351        Latest Ref Rng & Units 03/04/2022     9:39 AM 01/22/2022   11:18 AM 12/10/2021   10:23 AM  CMP  Glucose 70 - 99 mg/dL 409  92  811   BUN 8 - 23 mg/dL 19  21  27    Creatinine 0.61 - 1.24 mg/dL 9.14  7.82  9.56   Sodium 135 - 145 mmol/L 144  144  144   Potassium 3.5 - 5.1 mmol/L 4.0  3.6  3.6   Chloride 98 - 111 mmol/L 109  110  111   CO2 22 - 32 mmol/L 27  30  26    Calcium 8.9 - 10.3 mg/dL 8.8  9.3  9.4   Total Protein 6.5 - 8.1 g/dL 7.3  6.7  7.1   Total Bilirubin 0.3 - 1.2 mg/dL 0.5  0.5  0.5   Alkaline Phos 38 - 126 U/L 87  85  93   AST 15 - 41 U/L 31  30  33   ALT 0 - 44 U/L 25  25  31      RADIOGRAPHIC STUDIES: No results found.  ASSESSMENT & PLAN Gary [name] 77 y.o. male with medical history significant for essential thrombocytosis, JAK2 positive who presents for a follow up visit.   # Essential Thrombocytosis, JAK2 Positive  --Diagnosis confirmed by bone marrow biopsy on 08/05/2021. --target Plt count <400. At target today. WBC *** --continue ASA 81 mg PO daily and Hydroxyurea 500 mg QOD.  --Do no recommend resveratrol as it is not a proven treatment for this condition. (Discussed with patient's family) --RTC in 12 weeks with labs.   No orders of the defined types were placed in this encounter.   All questions were answered. The patient knows to call the clinic with any problems, questions or concerns.  A total of more than 25 minutes were spent on this encounter with face-to-face time and non-face-to-face time, including preparing to see the patient, ordering tests and/or medications, counseling the patient and coordination of care as outlined above.   Ulysees Barns, MD Department of Hematology/Oncology [facility] Phone: [phone] Pager: [phone] Email: [email or web address]  06/09/2022 7:54 AM

## 2022-06-10 MED ORDER — AMPHETAMINE-DEXTROAMPHET ER 30 MG PO CP24: 30.0000 mg | ORAL_CAPSULE | Freq: Every day | ORAL | 0 refills | Status: DC

## 2022-06-10 MED ORDER — AMPHETAMINE-DEXTROAMPHETAMINE 20 MG PO TABS: 20.0000 mg | ORAL_TABLET | Freq: Every day | ORAL | 0 refills | Status: DC

## 2022-06-10 NOTE — Progress Notes (Signed)
[facility] Summary Report / Loop Recorder 

## 2022-06-10 NOTE — Telephone Encounter (Signed)
Rx'es filled. I accidentally sent these to [facility] initially. Changed them to [facility]

## 2022-06-11 ENCOUNTER — Telehealth: Payer: Self-pay | Admitting: Hematology and Oncology

## 2022-06-16 ENCOUNTER — Ambulatory Visit (INDEPENDENT_AMBULATORY_CARE_PROVIDER_SITE_OTHER): Payer: 59

## 2022-06-16 DIAGNOSIS — I429 Cardiomyopathy, unspecified: Secondary | ICD-10-CM | POA: Diagnosis not present

## 2022-06-16 LAB — CUP PACEART REMOTE DEVICE CHECK
Date Time Interrogation Session: 20240609231315
Implantable Pulse Generator Implant Date: 20230421

## 2022-06-19 ENCOUNTER — Other Ambulatory Visit: Payer: Self-pay

## 2022-06-19 DIAGNOSIS — I5189 Other ill-defined heart diseases: Secondary | ICD-10-CM

## 2022-06-19 DIAGNOSIS — I429 Cardiomyopathy, unspecified: Secondary | ICD-10-CM

## 2022-06-19 MED ORDER — ISOSORBIDE MONONITRATE ER 30 MG PO TB24: 30.0000 mg | ORAL_TABLET | Freq: Every day | ORAL | 3 refills | Status: DC

## 2022-07-02 ENCOUNTER — Inpatient Hospital Stay: Payer: 59

## 2022-07-02 ENCOUNTER — Other Ambulatory Visit: Payer: Self-pay

## 2022-07-02 ENCOUNTER — Inpatient Hospital Stay (HOSPITAL_BASED_OUTPATIENT_CLINIC_OR_DEPARTMENT_OTHER): Payer: 59 | Admitting: Physician Assistant

## 2022-07-02 VITALS — BP 144/59 | HR 74 | Temp 98.2°F | Resp 16 | Ht 70.0 in | Wt 171.0 lb

## 2022-07-02 DIAGNOSIS — D75839 Thrombocytosis, unspecified: Secondary | ICD-10-CM | POA: Diagnosis present

## 2022-07-02 DIAGNOSIS — D473 Essential (hemorrhagic) thrombocythemia: Secondary | ICD-10-CM | POA: Diagnosis not present

## 2022-07-02 DIAGNOSIS — R5383 Other fatigue: Secondary | ICD-10-CM | POA: Diagnosis not present

## 2022-07-02 LAB — CMP (CANCER CENTER ONLY)
ALT: 15 U/L (ref 0–44)
AST: 21 U/L (ref 15–41)
Albumin: 3.6 g/dL (ref 3.5–5.0)
Alkaline Phosphatase: 89 U/L (ref 38–126)
Anion gap: 7 (ref 5–15)
BUN: 20 mg/dL (ref 8–23)
CO2: 26 mmol/L (ref 22–32)
Calcium: 9.2 mg/dL (ref 8.9–10.3)
Chloride: 109 mmol/L (ref 98–111)
Creatinine: 1.83 mg/dL — ABNORMAL HIGH (ref 0.61–1.24)
GFR, Estimated: 38 mL/min — ABNORMAL LOW (ref >60)
Glucose, Bld: 165 mg/dL — ABNORMAL HIGH (ref 70–99)
Potassium: 3.7 mmol/L (ref 3.5–5.1)
Sodium: 142 mmol/L (ref 135–145)
Total Bilirubin: 0.4 mg/dL (ref 0.3–1.2)
Total Protein: 7.1 g/dL (ref 6.5–8.1)

## 2022-07-02 LAB — CBC WITH DIFFERENTIAL (CANCER CENTER ONLY)
Abs Immature Granulocytes: 0.02 10*3/uL (ref 0.00–0.07)
Basophils Absolute: 0.1 10*3/uL (ref 0.0–0.1)
Basophils Relative: 1 %
Eosinophils Absolute: 0.2 10*3/uL (ref 0.0–0.5)
Eosinophils Relative: 4 %
HCT: 47.2 % (ref 39.0–52.0)
Hemoglobin: 15.6 g/dL (ref 13.0–17.0)
Immature Granulocytes: 0 %
Lymphocytes Relative: 13 %
Lymphs Abs: 0.9 10*3/uL (ref 0.7–4.0)
MCH: 29.3 pg (ref 26.0–34.0)
MCHC: 33.1 g/dL (ref 30.0–36.0)
MCV: 88.7 fL (ref 80.0–100.0)
Monocytes Absolute: 0.5 10*3/uL (ref 0.1–1.0)
Monocytes Relative: 7 %
Neutro Abs: 5.2 10*3/uL (ref 1.7–7.7)
Neutrophils Relative %: 75 %
Platelet Count: 455 10*3/uL — ABNORMAL HIGH (ref 150–400)
RBC: 5.32 MIL/uL (ref 4.22–5.81)
RDW: 14.2 % (ref 11.5–15.5)
WBC Count: 6.8 10*3/uL (ref 4.0–10.5)
nRBC: 0 % (ref 0.0–0.2)

## 2022-07-02 NOTE — Progress Notes (Signed)
[facility] Telephone:(336) [phone]   Fax:(336) [phone]  PROGRESS NOTE  Patient Care Team: Stevphen Rochester, MD as PCP - General (Family Medicine) Jodelle Red, MD as PCP - Cardiology (Cardiology) Noel Journey (Family Medicine) Jodelle Red, MD as Consulting Physician (Cardiology)  Hematological/Oncological History # Essential Thrombocytosis, JAK2 Positive  05/31/2021: establish care with Dr. Leonides Schanz for thrombocytosis. Found to be JAK2 positive.  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  The differential diagnosis includes essential thrombocythemia and early phase of primary myelofibrosis. 09/10/2021: start hydroxyurea 500 mg QOD. Was to start on 8/4 but patient was hesitant.  10/15/2021: WBC 7.7, Hgb 16.0, MCV 85.5, Plt 404 12/10/2021: WBC 6.1, Hgb 15.6, MCV 90.5, Plt 351 07/02/2022: WBC 6.8, Hgb 15.6, MCV 88.7, Plt 455K.   Interval History:  Gary [name] 77 y.o. male with medical history significant for essential thrombocytosis, JAK2 positive who presents for a follow up visit. The patient's last visit was on 03/04/2022. In the interim since the last visit he has continued hydroxyurea QOD.   On exam today Gary [name] reports that he is tolerating the hydroxyurea but is not taking every other day as prescribed. His grandchild was recently born and wife adds that life has been very hectic. He is taking his hydroxyurea every 3 days instead. [name] adds that he did have a couple of days of dizziness which resolved on its own. He reports his energy is stable with persistent fatigue. He requires frequent resting. His appetite is stable but doesn't drink enough water. He denies nausea, vomiting or abdominal pain. His bowel habits are unchanged. He denies easy bruising or signs of active bleeding. He denies fevers, chills,sweats, shortness of breath, chest pain or cough. He has  no other complaints. Full 10 point ROS is listed below.  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   Trauma     SURGICAL HISTORY: Past Surgical History:  Procedure Laterality Date   LOOP RECORDER INSERTION N/A 04/26/2021   Procedure: LOOP RECORDER INSERTION;  Surgeon: Thurmon Fair, MD;  [facility];  Service: Cardiovascular;  Laterality: N/A;   MINOR REMOVAL OF MANDIBULAR HARDWARE N/A 12/15/2019   Procedure: REMOVAL OF RIGHT LATERAL ORBITAL MINIPLATE;  Surgeon: Peggye Form, DO;  Location: [facility] OR;  Service: Plastics;  Laterality: N/A;   ORIF MANDIBULAR FRACTURE Bilateral 07/27/2019   Procedure: OPEN REDUCTION INTERNAL FIXATION (ORIF) OF COMPLEX ZYGOMATIC FRACTURE;  Surgeon: Peggye Form, DO;  Location: [facility] OR;  Service: Plastics;  Laterality: Bilateral;  2 hours, please    SOCIAL HISTORY: Social History   Socioeconomic History   Marital status: Married    Spouse name: Kenyen Candy   Number of children: Not on file   Years of education: Not on file   Highest education level: Not on file  Occupational History   Occupation: Quarry manager    Comment: [employer]  Tobacco Use   Smoking status: Former    Types: Cigarettes   Smokeless tobacco: Never  Vaping Use   Vaping Use: Never used  Substance and Sexual Activity   Alcohol use: Not Currently   Drug use: Never   Sexual activity: Yes  Other Topics Concern   Not on file  Social History Narrative   ** Merged History Encounter **       Social Determinants of Health   Financial Resource Strain: Not on file  Food Insecurity: Not  on file  Transportation Needs: Not on file  Physical Activity: Not on file  Stress: Not on file  Social Connections: Not on file  Intimate Partner Violence: Not on file    FAMILY HISTORY: Family History  Problem Relation Age of Onset   Heart disease Mother    Hypertension Mother    Stroke Mother    Polycythemia Father    Polycythemia Brother    Leukemia Brother     ALLERGIES:  is allergic to levaquin [levofloxacin], atomoxetine hcl, and shellfish allergy.  MEDICATIONS:  Current Outpatient Medications  Medication Sig Dispense Refill   acetaminophen (TYLENOL) 325 MG tablet Take 1-2 tablets (325-650 mg total) by mouth every 4 (four) hours as needed for mild pain.     amLODipine (NORVASC) 5 MG tablet Take 1 tablet (5 mg total) by mouth daily. 30 tablet 6   amphetamine-dextroamphetamine (ADDERALL XR) 30 MG 24 hr capsule Take 1 capsule (30 mg total) by mouth daily. 30 capsule 0   amphetamine-dextroamphetamine (ADDERALL XR) 30 MG 24 hr capsule Take 1 capsule (30 mg total) by mouth daily. 30 capsule 0   amphetamine-dextroamphetamine (ADDERALL) 20 MG tablet Take 1 tablet (20 mg total) by mouth daily. 30 tablet 0   amphetamine-dextroamphetamine (ADDERALL) 20 MG tablet Take 1 tablet (20 mg total) by mouth daily. 30 tablet 0   aspirin 81 MG EC tablet Take 1 tablet (81 mg total) by mouth daily. Swallow whole. 30 tablet 0   azelastine (ASTELIN) 0.1 % nasal spray Place 1 spray into both nostrils 2 (two) times daily. Use in each nostril as directed 30 mL 12   b complex vitamins capsule Take 1 capsule by mouth daily.     carvedilol (COREG) 3.125 MG tablet TAKE 1 TABLET(3.125 MG) BY MOUTH TWICE DAILY WITH A MEAL 60 tablet 10   cetirizine (ZYRTEC) 10 MG tablet Take 1 tablet (10 mg total) by mouth daily. 30 tablet 11   DULoxetine (CYMBALTA) 60 MG capsule TAKE 1 CAPSULE(60 MG) BY MOUTH DAILY 30 capsule 6   ezetimibe (ZETIA) 10 MG tablet Take 1 tablet (10 mg total) by mouth daily. 90 tablet 3    finasteride (PROSCAR) 5 MG tablet Take 1 tablet (5 mg total) by mouth daily. 30 tablet 0   hydrALAZINE (APRESOLINE) 10 MG tablet Take 1 tablet (10 mg total) by mouth 3 (three) times daily. 90 tablet 11   hydroxyurea (HYDREA) 500 MG capsule Take 1 capsule (500 mg total) by mouth every other day. May take with food to minimize GI side effects. 90 capsule 1   isosorbide mononitrate (IMDUR) 30 MG 24 hr tablet Take 1 tablet (30 mg total) by mouth daily. 90 tablet 3   levETIRAcetam (KEPPRA) 500 MG tablet Take 1 tablet (500 mg total) by mouth 2 (two) times daily. 180 tablet 3   Multiple Vitamin (MULTIVITAMIN WITH MINERALS) TABS tablet Take 1 tablet by mouth daily.     pantoprazole (PROTONIX) 40 MG tablet TAKE 1 TABLET(40 MG) BY MOUTH DAILY 90 tablet 3   rosuvastatin (CRESTOR) 20 MG tablet TAKE 1 TABLET(20 MG)  BY MOUTH DAILY 30 tablet 10   No current facility-administered medications for this visit.    REVIEW OF SYSTEMS:   Constitutional: ( - ) fevers, ( - )  chills , ( - ) night sweats Eyes: ( - ) blurriness of vision, ( - ) double vision, ( - ) watery eyes Ears, nose, mouth, throat, and face: ( - ) mucositis, ( - ) sore throat Respiratory: ( - ) cough, ( - ) dyspnea, ( - ) wheezes Cardiovascular: ( - ) palpitation, ( - ) chest discomfort, ( - ) lower extremity swelling Gastrointestinal:  ( - ) nausea, ( - ) heartburn, ( - ) change in bowel habits Skin: ( - ) abnormal skin rashes Lymphatics: ( - ) new lymphadenopathy, ( - ) easy bruising Neurological: ( - ) numbness, ( - ) tingling, ( - ) new weaknesses Behavioral/Psych: ( - ) mood change, ( - ) new changes  All other systems were reviewed with the patient and are negative.  PHYSICAL EXAMINATION:  Vitals:   07/02/22 1256  BP: (!) 144/59  Pulse: 74  Resp: 16  Temp: 98.2 F (36.8 C)  SpO2: 99%     Filed Weights   07/02/22 1256  Weight: 171 lb (77.6 kg)      GENERAL: Well-appearing elderly Caucasian male, alert, no distress and  comfortable SKIN: skin color, texture, turgor are normal, no rashes or significant lesions EYES: conjunctiva are pink and non-injected, sclera clear LUNGS: clear to auscultation and percussion with normal breathing effort HEART: regular rate & rhythm and no murmurs and no lower extremity edema Musculoskeletal: no cyanosis of digits and no clubbing  PSYCH: alert & oriented x 3, fluent speech NEURO: no focal motor/sensory deficits  LABORATORY DATA:  I have reviewed the data as listed    Latest Ref Rng & Units 07/02/2022   12:39 PM 03/04/2022    9:39 AM 01/22/2022   11:18 AM  CBC  WBC 4.0 - 10.5 K/uL 6.8  6.7  5.7   Hemoglobin 13.0 - 17.0 g/dL 16.1  09.6  04.5   Hematocrit 39.0 - 52.0 % 47.2  44.4  43.6   Platelets 150 - 400 K/uL 455  391  353        Latest Ref Rng & Units 03/04/2022    9:39 AM 01/22/2022   11:18 AM 12/10/2021   10:23 AM  CMP  Glucose 70 - 99 mg/dL 409  92  811   BUN 8 - 23 mg/dL 19  21  27    Creatinine 0.61 - 1.24 mg/dL 9.14  7.82  9.56   Sodium 135 - 145 mmol/L 144  144  144   Potassium 3.5 - 5.1 mmol/L 4.0  3.6  3.6   Chloride 98 - 111 mmol/L 109  110  111   CO2 22 - 32 mmol/L 27  30  26    Calcium 8.9 - 10.3 mg/dL 8.8  9.3  9.4   Total Protein 6.5 - 8.1 g/dL 7.3  6.7  7.1   Total Bilirubin 0.3 - 1.2 mg/dL 0.5  0.5  0.5   Alkaline Phos 38 - 126 U/L 87  85  93   AST 15 - 41 U/L 31  30  33   ALT 0 - 44 U/L 25  25  31      RADIOGRAPHIC STUDIES: CUP PACEART REMOTE DEVICE CHECK  Result Date: 06/16/2022 ILR summary report received. Battery status OK. Normal device function. No new symptom, tachy, brady, or  pause episodes. No new AF episodes. Monthly summary reports and ROV/PRN LA, CVRS   ASSESSMENT & PLAN Korion Cuevas is a 77 y.o. male with medical history significant for essential thrombocytosis, JAK2 positive who presents for a follow up visit.   # Essential Thrombocytosis, JAK2 Positive  --Diagnosis confirmed by bone marrow biopsy on 08/05/2021. --target  Plt count <400.  WBC 6.8, Hgb 15.6, MCV 88.7, Plt increased to 455K. --continue ASA 81 mg PO daily --current dose of Hydroxyurea  is 500 mg every other day but patient is admits poor compliance and takes it every 3 days . Recommend to take medication as prescribed.   --RTC in 4 weeks with lab check. If platelet count is still elevated in 4 weeks, then recommend to increase dose of Hydroxyurea 500 mg to MWF and weekends, holding on Tuesdays and Thursdays.  No orders of the defined types were placed in this encounter.   All questions were answered. The patient knows to call the clinic with any problems, questions or concerns.  A total of more than 25 minutes were spent on this encounter with face-to-face time and non-face-to-face time, including preparing to see the patient, ordering tests and/or medications, counseling the patient and coordination of care as outlined above.   Georga Kaufmann PA-C Dept of Hematology and Oncology [facility] Phone: [phone]   07/02/2022 1:12 PM

## 2022-07-03 ENCOUNTER — Telehealth: Payer: Self-pay | Admitting: Physician Assistant

## 2022-07-03 MED ORDER — HYDROXYUREA 500 MG PO CAPS: 500.0000 mg | ORAL_CAPSULE | ORAL | 1 refills | Status: DC

## 2022-07-03 NOTE — Addendum Note (Signed)
Addended by: Georga Kaufmann T on: 07/03/2022 09:06 AM   Modules accepted: Orders

## 2022-07-08 ENCOUNTER — Ambulatory Visit (INDEPENDENT_AMBULATORY_CARE_PROVIDER_SITE_OTHER): Payer: 59 | Admitting: Adult Health

## 2022-07-08 ENCOUNTER — Encounter: Payer: Self-pay | Admitting: Adult Health

## 2022-07-08 VITALS — BP 142/75 | HR 74 | Ht 70.0 in | Wt 169.2 lb

## 2022-07-08 DIAGNOSIS — R4189 Other symptoms and signs involving cognitive functions and awareness: Secondary | ICD-10-CM | POA: Diagnosis not present

## 2022-07-08 DIAGNOSIS — I63412 Cerebral infarction due to embolism of left middle cerebral artery: Secondary | ICD-10-CM

## 2022-07-08 DIAGNOSIS — R569 Unspecified convulsions: Secondary | ICD-10-CM

## 2022-07-08 DIAGNOSIS — I639 Cerebral infarction, unspecified: Secondary | ICD-10-CM

## 2022-07-08 MED ORDER — LEVETIRACETAM ER 500 MG PO TB24: 1000.0000 mg | ORAL_TABLET | Freq: Every day | ORAL | 5 refills | Status: DC
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
## 2022-07-08 NOTE — Patient Instructions (Signed)
Memory score is 22 out of 30 we will continue to monitor for now Continue Keppra 500 mg twice a day for seizure prevention Continue on aspirin for stroke prevention Keep regular follow-ups with PCP to manage other stroke risk factors including cholesterol, blood pressure and diabetes. If your symptoms worsen or you develop new symptoms please let us know.

## 2022-07-08 NOTE — Progress Notes (Signed)
[facility] Summary Report / Loop Recorder 

## 2022-07-16 ENCOUNTER — Telehealth: Payer: Self-pay | Admitting: Physician Assistant

## 2022-07-16 NOTE — Telephone Encounter (Signed)
Patient is aware of upcoming appointment times/dates.  

## 2022-07-17 ENCOUNTER — Other Ambulatory Visit: Payer: Self-pay | Admitting: Physical Medicine & Rehabilitation

## 2022-07-17 DIAGNOSIS — F329 Major depressive disorder, single episode, unspecified: Secondary | ICD-10-CM

## 2022-07-17 DIAGNOSIS — I7300 Raynaud's syndrome without gangrene: Secondary | ICD-10-CM

## 2022-07-18 IMAGING — US US EXTREM LOW VENOUS*L*
1 series · 13 of 24 positions shown · non-contrast
Comparison: None.

CLINICAL DATA: Left calf pain and swelling.  Recent immobility.



[Series 1: us extrem low venous*left* · 40 acquisitions, 13 frames shown]
[im 1/40]
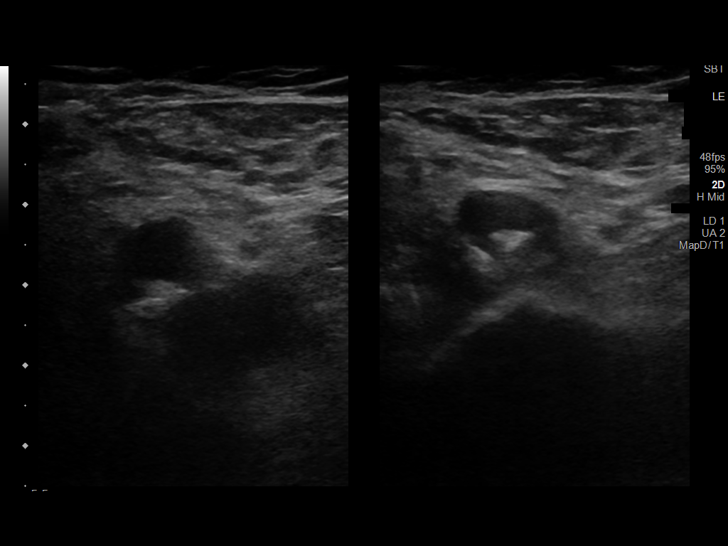
[im 4/40]
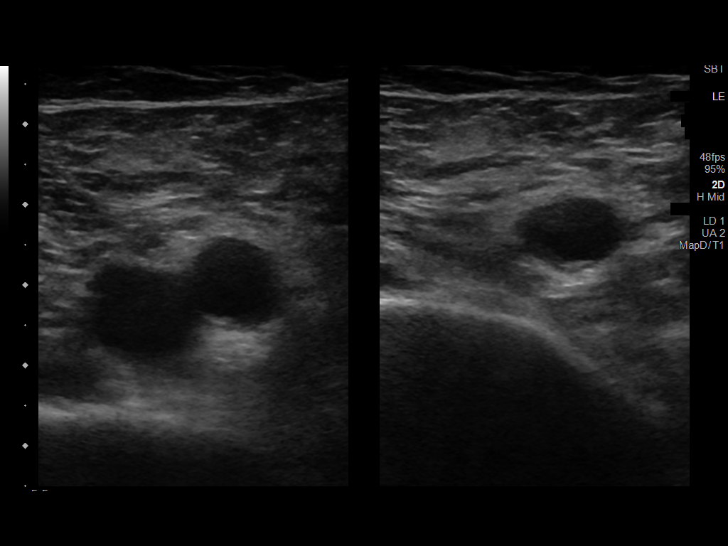
[im 7/40]
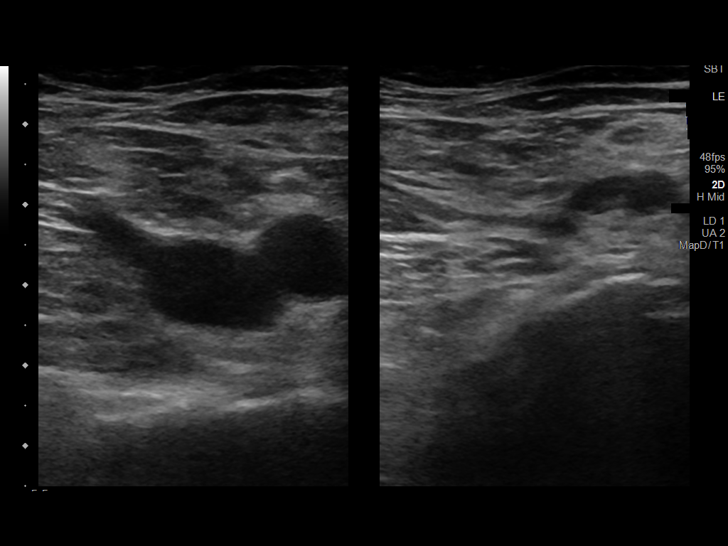
[im 12/40]
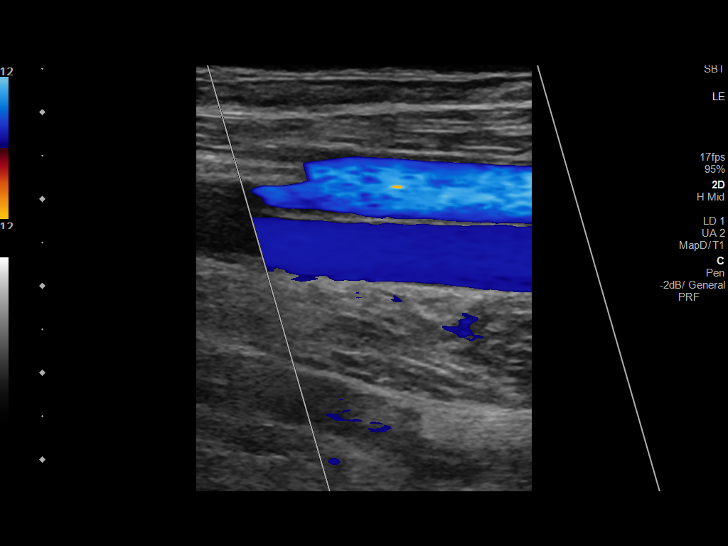
[im 16/40]
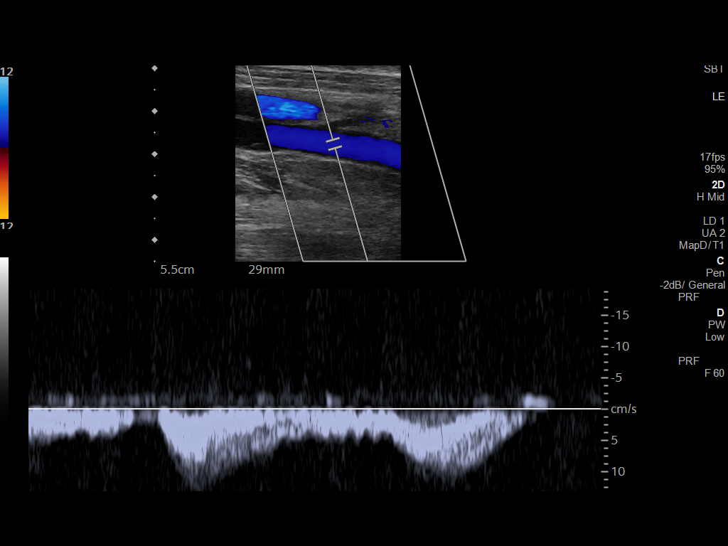
[im 19/40]
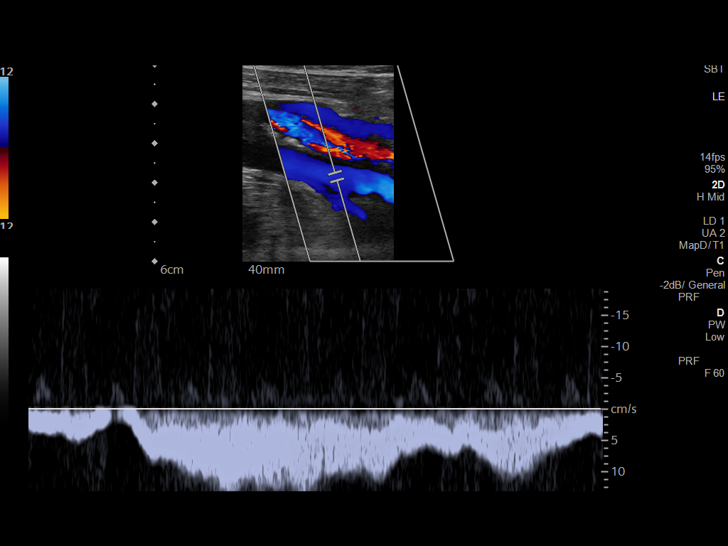
[im 23/40]
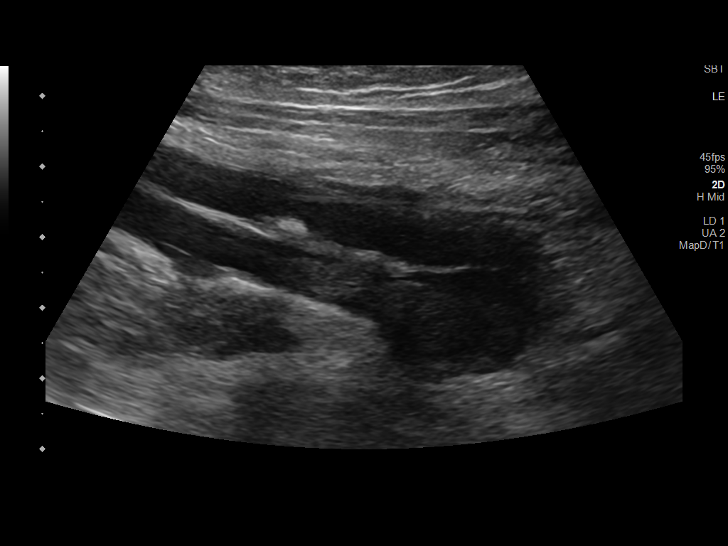
[im 24/40]
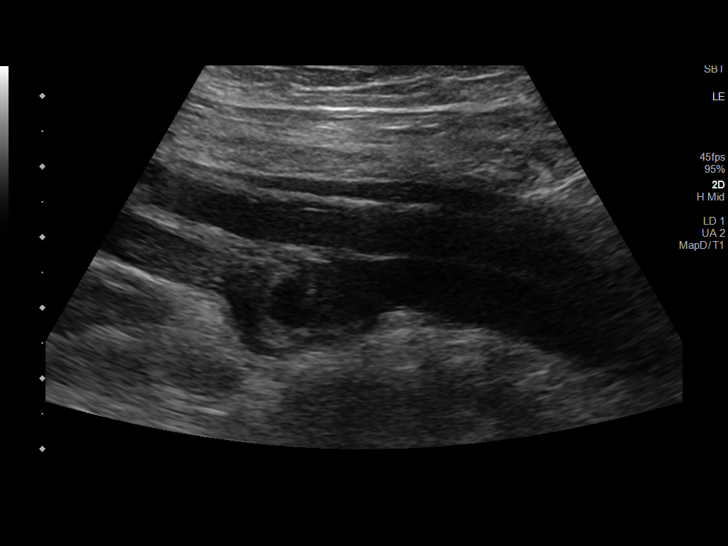
[im 28/40]
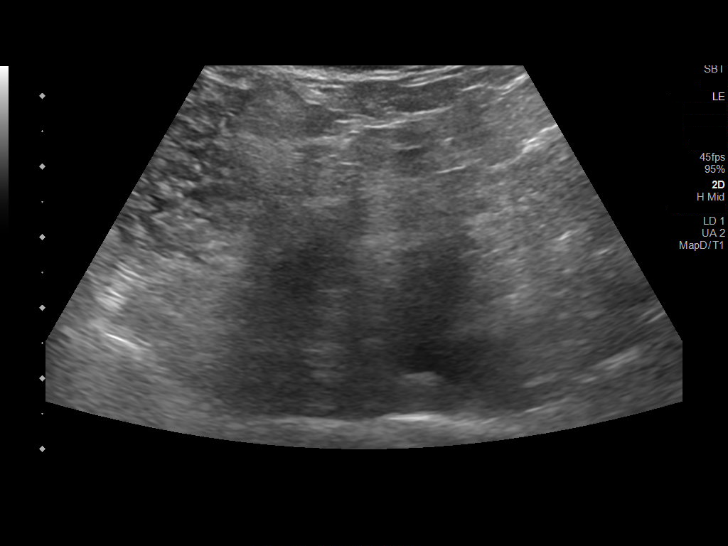
[im 29/40]
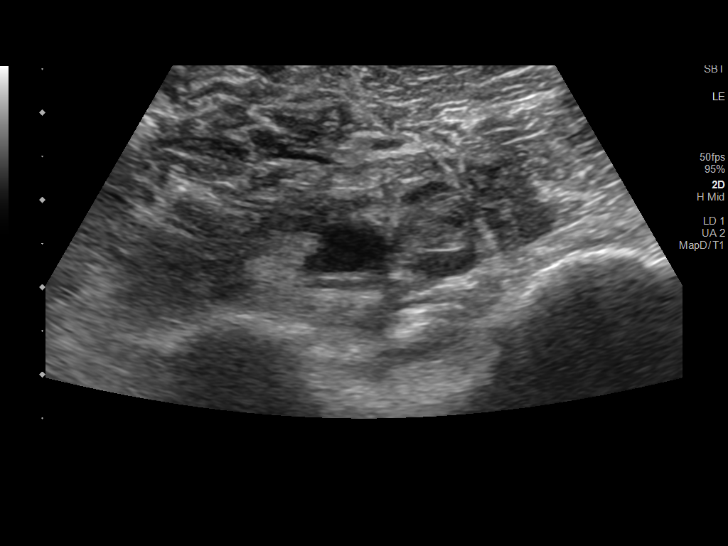
[im 34/40]
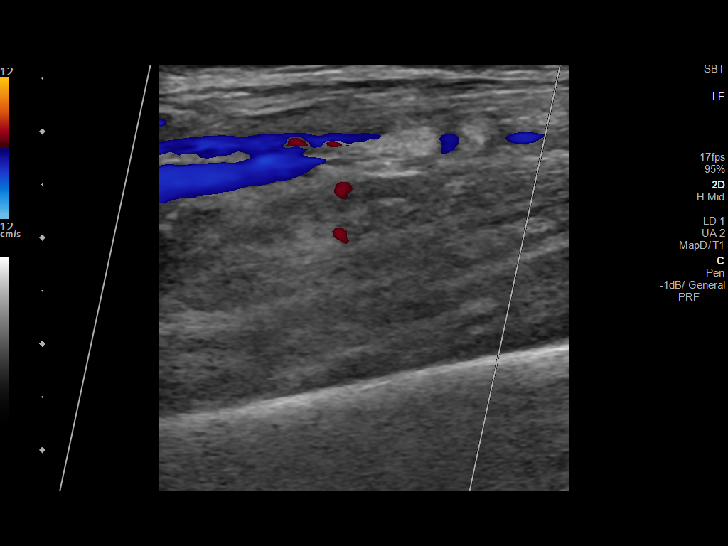
[im 38/40]
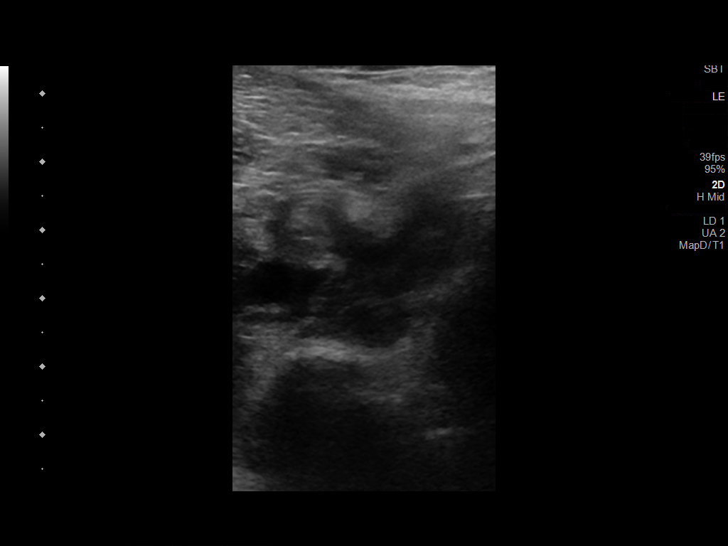
[im 40/40]
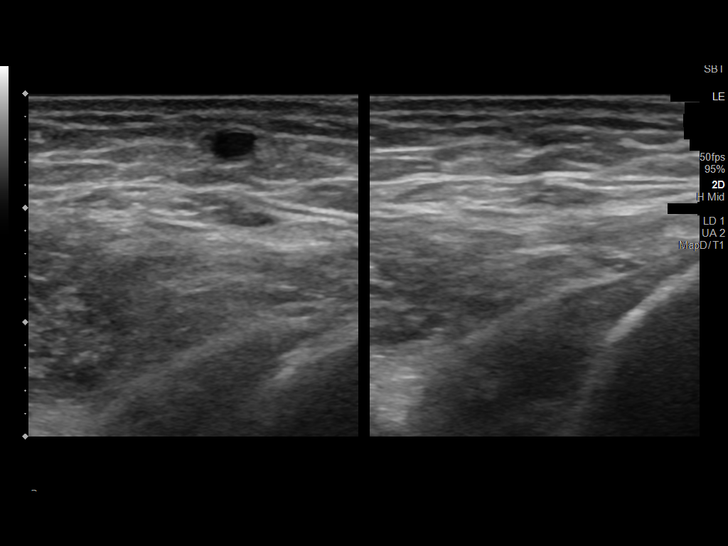

[13 of 24 positions shown; findings below may reference images not displayed]

FINDINGS: Contralateral Common Femoral Vein: Respiratory phasicity is normal
and symmetric with the symptomatic side. No evidence of thrombus.
Normal compressibility.

Common Femoral Vein: No evidence of thrombus. Normal
compressibility, respiratory phasicity and response to augmentation.

Saphenofemoral Junction: No evidence of thrombus. Normal
compressibility and flow on color Doppler imaging.

Profunda Femoral Vein: No evidence of thrombus. Normal
compressibility and flow on color Doppler imaging.

Femoral Vein: Positive for thrombus. Nonocclusive echogenic thrombus
in a left distal femoral vein. Evidence for femoral vein duplication
in the distal thigh.

Popliteal Vein: Positive for thrombus. Popliteal vein is non
compressible with echogenic thrombus.

Calf Veins: Positive for thrombus. Evidence for thrombus involving 1
of the peroneal veins. There may be a small focus of thrombus in a
posterior tibial vein near the knee.

Superficial Great Saphenous Vein: No evidence of thrombus. Normal
compressibility.

Other Findings:  None.
IMPRESSION: Positive for deep venous thrombosis in the left lower extremity.
Largest clot burden is in the left popliteal vein. Small amount of
thrombus in a distal left femoral vein and involving the left deep
calf veins.

These results will be called to the ordering clinician or
representative by the Radiologist Assistant, and communication
documented in the PACS or [REDACTED].

## 2022-07-21 ENCOUNTER — Ambulatory Visit (INDEPENDENT_AMBULATORY_CARE_PROVIDER_SITE_OTHER): Payer: 59

## 2022-07-21 DIAGNOSIS — I5189 Other ill-defined heart diseases: Secondary | ICD-10-CM | POA: Diagnosis not present

## 2022-07-22 LAB — CUP PACEART REMOTE DEVICE CHECK
Date Time Interrogation Session: 20240712230948
Implantable Pulse Generator Implant Date: 20230421

## 2022-07-30 ENCOUNTER — Inpatient Hospital Stay: Payer: 59 | Attending: Hematology and Oncology

## 2022-07-30 ENCOUNTER — Encounter: Payer: Self-pay | Admitting: Physician Assistant

## 2022-07-30 ENCOUNTER — Other Ambulatory Visit: Payer: Self-pay | Admitting: Physician Assistant

## 2022-07-30 DIAGNOSIS — D75839 Thrombocytosis, unspecified: Secondary | ICD-10-CM | POA: Diagnosis not present

## 2022-07-30 DIAGNOSIS — D473 Essential (hemorrhagic) thrombocythemia: Secondary | ICD-10-CM

## 2022-07-30 LAB — CMP (CANCER CENTER ONLY)
ALT: 18 U/L (ref 0–44)
AST: 23 U/L (ref 15–41)
Albumin: 3.8 g/dL (ref 3.5–5.0)
Alkaline Phosphatase: 92 U/L (ref 38–126)
Anion gap: 6 (ref 5–15)
BUN: 22 mg/dL (ref 8–23)
CO2: 27 mmol/L (ref 22–32)
Calcium: 9.3 mg/dL (ref 8.9–10.3)
Chloride: 113 mmol/L — ABNORMAL HIGH (ref 98–111)
Creatinine: 1.68 mg/dL — ABNORMAL HIGH (ref 0.61–1.24)
GFR, Estimated: 42 mL/min — ABNORMAL LOW (ref >60)
Glucose, Bld: 73 mg/dL (ref 70–99)
Potassium: 4.1 mmol/L (ref 3.5–5.1)
Sodium: 146 mmol/L — ABNORMAL HIGH (ref 135–145)
Total Bilirubin: 0.3 mg/dL (ref 0.3–1.2)
Total Protein: 6.7 g/dL (ref 6.5–8.1)

## 2022-07-30 LAB — CBC WITH DIFFERENTIAL (CANCER CENTER ONLY)
Abs Immature Granulocytes: 0.02 10*3/uL (ref 0.00–0.07)
Basophils Absolute: 0.1 10*3/uL (ref 0.0–0.1)
Basophils Relative: 1 %
Eosinophils Absolute: 0.2 10*3/uL (ref 0.0–0.5)
Eosinophils Relative: 3 %
HCT: 47 % (ref 39.0–52.0)
Hemoglobin: 15.5 g/dL (ref 13.0–17.0)
Immature Granulocytes: 0 %
Lymphocytes Relative: 18 %
Lymphs Abs: 1.1 10*3/uL (ref 0.7–4.0)
MCH: 28.9 pg (ref 26.0–34.0)
MCHC: 33 g/dL (ref 30.0–36.0)
MCV: 87.7 fL (ref 80.0–100.0)
Monocytes Absolute: 0.5 10*3/uL (ref 0.1–1.0)
Monocytes Relative: 9 %
Neutro Abs: 4 10*3/uL (ref 1.7–7.7)
Neutrophils Relative %: 69 %
Platelet Count: 491 10*3/uL — ABNORMAL HIGH (ref 150–400)
RBC: 5.36 MIL/uL (ref 4.22–5.81)
RDW: 15.1 % (ref 11.5–15.5)
WBC Count: 5.8 10*3/uL (ref 4.0–10.5)
nRBC: 0 % (ref 0.0–0.2)

## 2022-08-01 ENCOUNTER — Telehealth: Payer: Self-pay | Admitting: Physician Assistant

## 2022-08-01 NOTE — Telephone Encounter (Signed)
Labs from 07/30/2022 were reviewed with Dr. Leonides Schanz. Plt count has increased to 491K. Patient is compliant with taking Hydroxyurea medication as prescribed. Recommend to increase dose of Hydroxyurea 500 mg daily on Monday,Wednesday, Friday and weekends, holding on Tuesdays and Thursdays.

## 2022-08-04 ENCOUNTER — Telehealth: Payer: Self-pay

## 2022-08-04 NOTE — Telephone Encounter (Signed)
-----   Message from Briant Cedar sent at 08/04/2022 12:28 PM EDT ----- I responded to the patient's wife regarding platelet count. Message has not been read.   Dr. Leonides Schanz recommends increasing the dose of the Hydrea medication. The new prescription will be Hydroxyurea 500 mg daily on Monday,Wednesday, Friday and weekends, holding on Tuesdays and Thursdays.    Levothyroxine should not affect his platelet count. ----- Message ----- From: Interface, [facility] [place] Sent: 07/30/2022   2:27 PM EDT To: Briant Cedar, PA-C

## 2022-08-04 NOTE — Progress Notes (Signed)
[facility] Summary Report / Loop Recorder 

## 2022-08-04 NOTE — Telephone Encounter (Signed)
LM with lab results and recommendations

## 2022-08-13 ENCOUNTER — Encounter (HOSPITAL_BASED_OUTPATIENT_CLINIC_OR_DEPARTMENT_OTHER): Payer: Self-pay

## 2022-08-13 DIAGNOSIS — R9430 Abnormal result of cardiovascular function study, unspecified: Secondary | ICD-10-CM

## 2022-08-13 DIAGNOSIS — I5189 Other ill-defined heart diseases: Secondary | ICD-10-CM

## 2022-08-13 DIAGNOSIS — R931 Abnormal findings on diagnostic imaging of heart and coronary circulation: Secondary | ICD-10-CM

## 2022-08-13 DIAGNOSIS — R5383 Other fatigue: Secondary | ICD-10-CM

## 2022-08-18 ENCOUNTER — Other Ambulatory Visit (HOSPITAL_BASED_OUTPATIENT_CLINIC_OR_DEPARTMENT_OTHER): Payer: 59

## 2022-08-18 ENCOUNTER — Other Ambulatory Visit: Payer: Self-pay | Admitting: Hematology and Oncology

## 2022-08-18 ENCOUNTER — Ambulatory Visit (HOSPITAL_BASED_OUTPATIENT_CLINIC_OR_DEPARTMENT_OTHER): Payer: 59 | Admitting: Cardiology

## 2022-08-19 ENCOUNTER — Other Ambulatory Visit: Payer: Self-pay

## 2022-08-19 ENCOUNTER — Ambulatory Visit (HOSPITAL_COMMUNITY): Payer: 59 | Attending: Cardiology

## 2022-08-19 DIAGNOSIS — R931 Abnormal findings on diagnostic imaging of heart and coronary circulation: Secondary | ICD-10-CM

## 2022-08-19 DIAGNOSIS — R5383 Other fatigue: Secondary | ICD-10-CM

## 2022-08-19 DIAGNOSIS — R9430 Abnormal result of cardiovascular function study, unspecified: Secondary | ICD-10-CM | POA: Diagnosis not present

## 2022-08-19 DIAGNOSIS — I5189 Other ill-defined heart diseases: Secondary | ICD-10-CM | POA: Diagnosis present

## 2022-08-19 DIAGNOSIS — E782 Mixed hyperlipidemia: Secondary | ICD-10-CM

## 2022-08-19 LAB — ECHOCARDIOGRAM COMPLETE
Area-P 1/2: 3.54 cm2
P 1/2 time: 389 msec
S' Lateral: 4.7 cm

## 2022-08-19 MED ORDER — EZETIMIBE 10 MG PO TABS
10.0000 mg | ORAL_TABLET | Freq: Every day | ORAL | 1 refills | Status: AC
Start: 2022-08-19 — End: ?

## 2022-08-20 ENCOUNTER — Encounter
Payer: No Typology Code available for payment source | Attending: Physical Medicine & Rehabilitation | Admitting: Physical Medicine & Rehabilitation

## 2022-08-20 ENCOUNTER — Encounter: Payer: Self-pay | Admitting: Physical Medicine & Rehabilitation

## 2022-08-20 VITALS — BP 119/73 | HR 78 | Ht 70.0 in | Wt 165.0 lb

## 2022-08-20 DIAGNOSIS — M47816 Spondylosis without myelopathy or radiculopathy, lumbar region: Secondary | ICD-10-CM

## 2022-08-20 DIAGNOSIS — F09 Unspecified mental disorder due to known physiological condition: Secondary | ICD-10-CM

## 2022-08-20 DIAGNOSIS — F329 Major depressive disorder, single episode, unspecified: Secondary | ICD-10-CM

## 2022-08-20 DIAGNOSIS — S069XAS Unspecified intracranial injury with loss of consciousness status unknown, sequela: Secondary | ICD-10-CM

## 2022-08-20 DIAGNOSIS — G3189 Other specified degenerative diseases of nervous system: Secondary | ICD-10-CM | POA: Diagnosis present

## 2022-08-20 MED ORDER — SERTRALINE HCL 50 MG PO TABS: 50.0000 mg | ORAL_TABLET | Freq: Every day | ORAL | 4 refills | Status: DC

## 2022-08-20 NOTE — Patient Instructions (Addendum)
ALWAYS FEEL FREE TO CALL OUR OFFICE WITH ANY PROBLEMS OR QUESTIONS [phone])  **PLEASE NOTE** ALL MEDICATION REFILL REQUESTS (INCLUDING CONTROLLED SUBSTANCES) NEED TO BE MADE AT LEAST 7 DAYS PRIOR TO REFILL BEING DUE. ANY REFILL REQUESTS INSIDE THAT TIME FRAME MAY RESULT IN DELAYS IN RECEIVING YOUR PRESCRIPTION.    Transition: Zoloft 50mg  daily altenate with cymbalta every other day for 3 doses then stop.     Neurology: ?options to switch to from keppra. Lamictal, vimpat---are they options?    Take extra time to sit on edge of bed. Let your feet dangle a little bit... perhaps for 5 minutes or so.

## 2022-08-20 NOTE — Progress Notes (Signed)
Subjective:    Patient ID: Gary [name], male    DOB: Feb 13, 1945, 77 y.o.   MRN: [id number]  HPI  Gary [name] is here in follow up of his TBI and associated deficits. He wakes up every morning and feels dizzy for a few minutes until he gets going. This can happen when he gets up in the middle of the night. His wife feels that he's more off balance then general.   Wife also reports that his memory is worsening. He has more difficulty with processing. The change to (and increase of) cymbalta did not have a big impact on his mood. He's on 60mg  daily currently. They had discussed a repeat MRI with their PCP. Pt scored a 22/30 on MOCA performed at neurology office. Gary [name] himself feels that he's having more difficulty with attention. He does admit to feeling depressed. Neurology had changed him to keppra XR but cost was too high. He hasn't had any further seizures  He has an aide but the aide's don't do much physically with him. He is not doing much in the way of exercise at this point on his own. He does have interest in going to the gym, but they don't have a membership.       Pain Inventory Average Pain 0 Pain Right Now 0 My pain is  no pain  In the last 24 hours, has pain interfered with the following? General activity 0 Relation with others 0 Enjoyment of life 0 What TIME of day is your pain at its worst? varies Sleep (in general) Fair  Pain is worse with:  no pain Pain improves with:  no pain Relief from Meds:  no pain  Family History  Problem Relation Age of Onset   Heart disease Mother    Hypertension Mother    Stroke Mother    Polycythemia Father    Polycythemia Brother    Leukemia Brother    Social History   Socioeconomic History   Marital status: Married    Spouse name: Gaven Risko   Number of children: Not on file   Years of education: Not on file   Highest education level: Not on file  Occupational History   Occupation: Quarry manager    Comment:  [employer]  Tobacco Use   Smoking status: Former    Types: Cigarettes   Smokeless tobacco: Never  Vaping Use   Vaping status: Never Used  Substance and Sexual Activity   Alcohol use: Not Currently   Drug use: Never   Sexual activity: Yes  Other Topics Concern   Not on file  Social History Narrative   ** Merged History Encounter **       Social Determinants of Health   Financial Resource Strain: Low Risk  (02/19/2022)   Received from [facility], [facility]   Overall Financial Resource Strain (CARDIA)    Difficulty of Paying Living Expenses: Not very hard  Food Insecurity: No Food Insecurity (02/19/2022)   Received from [facility], [facility]   Hunger Vital Sign    Worried About Running Out of Food in the Last Year: Never true    Ran Out of Food in the Last Year: Never true  Transportation Needs: No Transportation Needs (02/19/2022)   Received from [facility], [facility]   PRAPARE - Transportation    Lack of Transportation (Medical): No    Lack of Transportation (Non-Medical): No  Physical Activity: Unknown (02/19/2022)   Received from [facility], [facility]  Exercise Vital Sign    Days of Exercise per Week: 0 days    Minutes of Exercise per Session: Not on file  Stress: Stress Concern Present (02/19/2022)   Received from [facility], [facility] - Occupational Stress Questionnaire    Feeling of Stress : To some extent  Social Connections: Somewhat Isolated (02/19/2022)   Received from [facility], [facility]   Social Network    How would you rate your social network (family, work, friends)?: Restricted participation with some degree of social isolation   Past Surgical History:  Procedure Laterality Date   LOOP RECORDER INSERTION N/A 04/26/2021   Procedure: LOOP RECORDER INSERTION;  Surgeon: Thurmon Fair, MD;  Location: [facility] INVASIVE CV LAB;  Service: Cardiovascular;  Laterality: N/A;   MINOR  REMOVAL OF MANDIBULAR HARDWARE N/A 12/15/2019   Procedure: REMOVAL OF RIGHT LATERAL ORBITAL MINIPLATE;  Surgeon: Peggye Form, DO;  Location: [facility] OR;  Service: Plastics;  Laterality: N/A;   ORIF MANDIBULAR FRACTURE Bilateral 07/27/2019   Procedure: OPEN REDUCTION INTERNAL FIXATION (ORIF) OF COMPLEX ZYGOMATIC FRACTURE;  Surgeon: Peggye Form, DO;  Location: [facility] OR;  Service: Plastics;  Laterality: Bilateral;  2 hours, please   Past Surgical History:  Procedure Laterality Date   LOOP RECORDER INSERTION N/A 04/26/2021   Procedure: LOOP RECORDER INSERTION;  Surgeon: Thurmon Fair, MD;  Location: [facility] INVASIVE CV [facility];  Service: Cardiovascular;  Laterality: N/A;   MINOR REMOVAL OF MANDIBULAR HARDWARE N/A 12/15/2019   Procedure: REMOVAL OF RIGHT LATERAL ORBITAL MINIPLATE;  Surgeon: Peggye Form, DO;  Location: [facility] OR;  Service: Plastics;  Laterality: N/A;   ORIF MANDIBULAR FRACTURE Bilateral 07/27/2019   Procedure: OPEN REDUCTION INTERNAL FIXATION (ORIF) OF COMPLEX ZYGOMATIC FRACTURE;  Surgeon: Peggye Form, DO;  Location: [facility] OR;  Service: Plastics;  Laterality: Bilateral;  2 hours, please   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   Trauma    BP 119/73   Pulse 78   Ht 5\' 10"  (1.778 m)   Wt 165 lb (74.8 kg)   SpO2 95%   BMI 23.68 kg/m   Opioid Risk Score:   Fall Risk Score:  `1  Depression screen PHQ 2/9     02/19/2022    1:28 PM 12/04/2021   10:55 AM 05/29/2021   11:07 AM 01/30/2021   11:37 AM 10/26/2020    9:44 AM 10/03/2020   11:40 AM 03/21/2020   10:11 AM  Depression screen PHQ 2/9  Decreased Interest 1 1 0  1 2 1    Down, Depressed, Hopeless 1 1 0 1 1 2 1   PHQ - 2 Score 2 2 0 1 2 4 2   Altered sleeping     0    Tired, decreased energy     3    Change in appetite     0    Feeling bad or failure about yourself      0    Trouble concentrating     3    Moving slowly or fidgety/restless  2    Suicidal thoughts     0    PHQ-9 Score     10    Difficult doing work/chores     Somewhat difficult      Review of Systems  Neurological:  Positive for dizziness and light-headedness.  All other systems reviewed and are negative.     Objective:   Physical Exam  General: No acute distress HEENT: NCAT, EOMI, oral membranes moist Cards: reg rate  Chest: normal effort Abdomen: Soft, NT, ND Skin: dry, intact Extremities: no edema Psych: cooperative but remains flat Skin: hands sl cool Neuro: Patient is alert and oriented to month but not the date or year.   He is able to follow basic commands.  Remains slow to process. Insight is fair Memory is fair.  Cranial nerve exam was somewhat nonfocal.  Strength is grossly 5 out of 5.     Gait was fairly steady.  No focal limb ataxia was seen.  No gross sensory abnormalities. Uses cane for balance. Tends to lean forward without cane. Romberg test +/-. Musculoskeletal: fair lumbar range of motion.            PLAN: 1. Functional deficits secondary to left MCA scattered small infarcts, Hx of TBI 2 years ago             -pt with memory/higher level processing deficits. Mood issues as well.   -I don't believe an MRI will shed any further light on cognitive deficits. I believe these are driven by his mood and potentially medications.    -SLP eval and treat  -see details below  -recommend a gym membership for physical, psycho-social, and cognitive stimulation 2.  Antithrombotics: On ASA alone 3. Pain in toes/feet/fingers/raynaud like appearance as well as low back pain:  -?some improvement with amlodipine---continue 5mg  -keep hands and feet warms, covered when  possible.     -tylenol prn -home stretching program should continue 4. Depression: LCSW to evaluate and provide emotional support             -reactive depression: dc cymbalta, convert to zoloft 50mg  daily             -keep Adderall XR to 30 mg daily mg daily.   -Continue Adderall immediate release 20 mg daily in the afternoon             -continue f/u [facility] soon             -continue to exercise and socialize as possible.                             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 Insomnia             Seems improved, off trazodone             -voiding before bed, PM "fluid rationing" 18. Mild thrombocytosis:             -Recent platelet count 353k!  30 minutes of face to face patient care time were spent during this visit. All questions were encouraged and answered.  Case was discussed with WC case Production designer, theatre/television/film.  Follow up with me in 2 MOS.

## 2022-08-21 LAB — CUP PACEART REMOTE DEVICE CHECK
Date Time Interrogation Session: 20240814230945
Implantable Pulse Generator Implant Date: 20230421

## 2022-08-25 ENCOUNTER — Ambulatory Visit (INDEPENDENT_AMBULATORY_CARE_PROVIDER_SITE_OTHER): Payer: 59

## 2022-08-25 ENCOUNTER — Ambulatory Visit (INDEPENDENT_AMBULATORY_CARE_PROVIDER_SITE_OTHER): Payer: 59 | Admitting: Cardiology

## 2022-08-25 ENCOUNTER — Encounter (HOSPITAL_BASED_OUTPATIENT_CLINIC_OR_DEPARTMENT_OTHER): Payer: Self-pay | Admitting: Cardiology

## 2022-08-25 VITALS — BP 116/57 | HR 69 | Ht 70.0 in | Wt 167.7 lb

## 2022-08-25 DIAGNOSIS — Z87820 Personal history of traumatic brain injury: Secondary | ICD-10-CM

## 2022-08-25 DIAGNOSIS — I429 Cardiomyopathy, unspecified: Secondary | ICD-10-CM | POA: Diagnosis not present

## 2022-08-25 DIAGNOSIS — Z8673 Personal history of transient ischemic attack (TIA), and cerebral infarction without residual deficits: Secondary | ICD-10-CM

## 2022-08-25 DIAGNOSIS — I5189 Other ill-defined heart diseases: Secondary | ICD-10-CM | POA: Diagnosis not present

## 2022-08-25 DIAGNOSIS — R5382 Chronic fatigue, unspecified: Secondary | ICD-10-CM | POA: Diagnosis not present

## 2022-08-25 MED ORDER — HYDRALAZINE HCL 10 MG PO TABS
10.0000 mg | ORAL_TABLET | Freq: Three times a day (TID) | ORAL | 3 refills | Status: DC
Start: 2022-08-25 — End: 2023-09-02
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
## 2022-08-25 NOTE — Patient Instructions (Signed)
Medication Instructions:  Your physician recommends that you continue on your current medications as directed. Please refer to the Current Medication list given to you today.  *If you need a refill on your cardiac medications before your next appointment, please call your pharmacy*  Lab Work: NONE  Testing/Procedures: NONE  Follow-Up: At [facility], you and your health needs are our priority.  As part of our continuing mission to provide you with exceptional heart care, we have created designated Provider Care Teams.  These Care Teams include your primary Cardiologist (physician) and Advanced Practice Providers (APPs -  Physician Assistants and Nurse Practitioners) who all work together to provide you with the care you need, when you need it.  We recommend signing up for the patient portal called "MyChart".  Sign up information is provided on this After Visit Summary.  MyChart is used to connect with patients for Virtual Visits (Telemedicine).  Patients are able to view lab/test results, encounter notes, upcoming appointments, etc.  Non-urgent messages can be sent to your provider as well.   To learn more about what you can do with MyChart, go to [email or web address].    Your next appointment:   6 month(s)  The format for your next appointment:   In Person  Provider:   Jodelle Red, MD or Ronn Melena NP

## 2022-08-26 ENCOUNTER — Encounter: Payer: Self-pay | Admitting: Adult Health

## 2022-08-26 DIAGNOSIS — R569 Unspecified convulsions: Secondary | ICD-10-CM

## 2022-08-26 DIAGNOSIS — I639 Cerebral infarction, unspecified: Secondary | ICD-10-CM

## 2022-08-27 ENCOUNTER — Ambulatory Visit: Payer: 59 | Admitting: Physician Assistant

## 2022-08-27 ENCOUNTER — Other Ambulatory Visit: Payer: 59

## 2022-08-27 MED ORDER — BRIVIACT 50 MG PO TABS: 50.0000 mg | ORAL_TABLET | Freq: Two times a day (BID) | ORAL | 5 refills | Status: AC

## 2022-09-01 ENCOUNTER — Other Ambulatory Visit (HOSPITAL_COMMUNITY): Payer: Self-pay

## 2022-09-02 ENCOUNTER — Other Ambulatory Visit: Payer: Self-pay | Admitting: Physician Assistant

## 2022-09-02 DIAGNOSIS — Z1589 Genetic susceptibility to other disease: Secondary | ICD-10-CM

## 2022-09-03 ENCOUNTER — Inpatient Hospital Stay: Payer: 59 | Attending: Hematology and Oncology

## 2022-09-03 ENCOUNTER — Inpatient Hospital Stay: Payer: 59 | Admitting: Physician Assistant

## 2022-09-03 VITALS — BP 130/68 | HR 69 | Temp 98.1°F | Resp 16 | Wt 168.1 lb

## 2022-09-03 DIAGNOSIS — D75839 Thrombocytosis, unspecified: Secondary | ICD-10-CM | POA: Diagnosis present

## 2022-09-03 DIAGNOSIS — Z87891 Personal history of nicotine dependence: Secondary | ICD-10-CM | POA: Diagnosis not present

## 2022-09-03 DIAGNOSIS — Z1589 Genetic susceptibility to other disease: Secondary | ICD-10-CM

## 2022-09-03 DIAGNOSIS — Z7982 Long term (current) use of aspirin: Secondary | ICD-10-CM | POA: Insufficient documentation

## 2022-09-03 DIAGNOSIS — D473 Essential (hemorrhagic) thrombocythemia: Secondary | ICD-10-CM | POA: Diagnosis not present

## 2022-09-03 LAB — CBC WITH DIFFERENTIAL (CANCER CENTER ONLY)
Abs Immature Granulocytes: 0.01 10*3/uL (ref 0.00–0.07)
Basophils Absolute: 0.1 10*3/uL (ref 0.0–0.1)
Basophils Relative: 1 %
Eosinophils Absolute: 0.1 10*3/uL (ref 0.0–0.5)
Eosinophils Relative: 2 %
HCT: 43.4 % (ref 39.0–52.0)
Hemoglobin: 14.6 g/dL (ref 13.0–17.0)
Immature Granulocytes: 0 %
Lymphocytes Relative: 16 %
Lymphs Abs: 1 10*3/uL (ref 0.7–4.0)
MCH: 29.6 pg (ref 26.0–34.0)
MCHC: 33.6 g/dL (ref 30.0–36.0)
MCV: 87.9 fL (ref 80.0–100.0)
Monocytes Absolute: 0.6 10*3/uL (ref 0.1–1.0)
Monocytes Relative: 10 %
Neutro Abs: 4.3 10*3/uL (ref 1.7–7.7)
Neutrophils Relative %: 71 %
Platelet Count: 312 10*3/uL (ref 150–400)
RBC: 4.94 MIL/uL (ref 4.22–5.81)
RDW: 16.1 % — ABNORMAL HIGH (ref 11.5–15.5)
WBC Count: 6.1 10*3/uL (ref 4.0–10.5)
nRBC: 0 % (ref 0.0–0.2)

## 2022-09-03 LAB — CMP (CANCER CENTER ONLY)
ALT: 15 U/L (ref 0–44)
AST: 22 U/L (ref 15–41)
Albumin: 3.7 g/dL (ref 3.5–5.0)
Alkaline Phosphatase: 90 U/L (ref 38–126)
Anion gap: 5 (ref 5–15)
BUN: 21 mg/dL (ref 8–23)
CO2: 29 mmol/L (ref 22–32)
Calcium: 9 mg/dL (ref 8.9–10.3)
Chloride: 110 mmol/L (ref 98–111)
Creatinine: 1.68 mg/dL — ABNORMAL HIGH (ref 0.61–1.24)
GFR, Estimated: 42 mL/min — ABNORMAL LOW (ref >60)
Glucose, Bld: 94 mg/dL (ref 70–99)
Potassium: 3.8 mmol/L (ref 3.5–5.1)
Sodium: 144 mmol/L (ref 135–145)
Total Bilirubin: 0.4 mg/dL (ref 0.3–1.2)
Total Protein: 6.7 g/dL (ref 6.5–8.1)

## 2022-09-03 MED ORDER — HYDROXYUREA 500 MG PO CAPS: ORAL_CAPSULE | ORAL | 1 refills | Status: DC

## 2022-09-03 NOTE — Progress Notes (Signed)
[facility] Telephone:([phone]) [phone]   Fax:(336) [phone]  PROGRESS NOTE  Patient Care Team: Stevphen Rochester, MD as PCP - General (Family Medicine) Jodelle Red, MD as PCP - Cardiology (Cardiology) Noel Journey (Family Medicine) Jodelle Red, MD as Consulting Physician (Cardiology)  Hematological/Oncological History # Essential Thrombocytosis, JAK2 Positive  05/31/2021: establish care with Dr. Leonides Schanz for thrombocytosis. Found to be JAK2 positive.  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  The differential diagnosis includes essential thrombocythemia and early phase of primary myelofibrosis. 09/10/2021: start hydroxyurea 500 mg QOD. Was to start on 8/4 but patient was hesitant.  10/15/2021: WBC 7.7, Hgb 16.0, MCV 85.5, Plt 404 12/10/2021: WBC 6.1, Hgb 15.6, MCV 90.5, Plt 351 07/02/2022: WBC 6.8, Hgb 15.6, MCV 88.7, Plt 455K.  07/30/2022: WBC 5.8, Hgb 15.5, MCV 87.7, Plt 491K. Increased dose of Hydroxyurea 500 mg once a day on Mondays, Wednesday, Fridays and Weekends. Holding Tuesdays and Thursdays 09/03/2022: WBC 6.1, Hgb 14.6, MCV 87.9, Plt 312K  Interval History:  Gary [name] 77 y.o. male with medical history significant for essential thrombocytosis, JAK2 positive who presents for a follow up visit. The patient's last visit was on 07/02/2022. In the interim since the last visit he has continued hydroxyurea therapy.   On exam today Gary [name] reports he is tolerating hydroxyurea without any concerning side effects.  His energy is stable and he is able to complete his ADLs on his own.  He denies any appetite or weight changes.  He denies nausea, vomiting or abdominal pain.  He reports no episodes of diarrhea but generally he has formed stools.  He does add that he can have up to 3-4 bowel movements per day.  He denies easy bruising or signs of active bleeding. He denies fevers,  chills,sweats, shortness of breath, chest pain or cough. He has no other complaints. Full 10 point ROS is listed below.  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   Trauma     SURGICAL HISTORY: Past Surgical History:  Procedure Laterality Date   LOOP RECORDER INSERTION N/A 04/26/2021   Procedure: LOOP RECORDER INSERTION;  Surgeon: Thurmon Fair, MD;  Location: [facility] INVASIVE CV [facility];  Service: Cardiovascular;  Laterality: N/A;   MINOR REMOVAL OF MANDIBULAR HARDWARE N/A 12/15/2019   Procedure: REMOVAL OF RIGHT LATERAL ORBITAL MINIPLATE;  Surgeon: Peggye Form, DO;  Location: [facility] OR;  Service: Plastics;  Laterality: N/A;   ORIF MANDIBULAR FRACTURE Bilateral 07/27/2019   Procedure: OPEN REDUCTION INTERNAL FIXATION (ORIF) OF COMPLEX ZYGOMATIC FRACTURE;  Surgeon: Peggye Form, DO;  Location: [facility] OR;  Service: Plastics;  Laterality: Bilateral;  2 hours, please    SOCIAL HISTORY: Social History   Socioeconomic History   Marital status: Married    Spouse name: Gary [name]   Number of children: Not on file   Years of education: Not on file   Highest education level: Not on file  Occupational History   Occupation: Quarry manager    Comment: [employer]  Tobacco  Use   Smoking status: Former    Types: Cigarettes   Smokeless tobacco: Never  Vaping Use   Vaping status: Never Used  Substance and Sexual Activity   Alcohol use: Not Currently   Drug use: Never   Sexual activity: Yes  Other Topics Concern   Not on file  Social History Narrative   ** Merged History Encounter **       Social Determinants of Health    Financial Resource Strain: Low Risk  (02/19/2022)   Received from [facility], [facility]   Overall Financial Resource Strain (CARDIA)    Difficulty of Paying Living Expenses: Not very hard  Food Insecurity: No Food Insecurity (02/19/2022)   Received from [facility], [facility]   Hunger Vital Sign    Worried About Running Out of Food in the Last Year: Never true    Ran Out of Food in the Last Year: Never true  Transportation Needs: No Transportation Needs (02/19/2022)   Received from [facility], [facility]   PRAPARE - Transportation    Lack of Transportation (Medical): No    Lack of Transportation (Non-Medical): No  Physical Activity: Unknown (02/19/2022)   Received from [facility], [facility]   Exercise Vital Sign    Days of Exercise per Week: 0 days    Minutes of Exercise per Session: Not on file  Stress: Stress Concern Present (02/19/2022)   Received from [facility], [facility] Occup[facility] - Occupational Stress Questionnaire    Feeling of Stress : To some extent  Social Connections: Somewhat Isolated (02/19/2022)   Received from [facility], [facility]   Social Network    How would you rate your social network (family, work, friends)?: Restricted participation with some degree of social isolation  Intimate Partner Violence: Not At Risk (02/19/2022)   Received from [facility], [facility]   HITS    Over the last 12 months how often did your partner physically hurt you?: 1    Over the last 12 months how often did your partner insult you or talk down to you?: 1    Over the last 12 months how often did your partner threaten you with physical harm?: 1    Over the last 12 months how often did your partner scream or curse at you?: 2    FAMILY HISTORY: Family History  Problem Relation Age of Onset   Heart disease Mother    Hypertension Mother    Stroke Mother    Polycythemia Father    Polycythemia Brother     Leukemia Brother     ALLERGIES:  is allergic to levofloxacin, atomoxetine hcl, and shellfish allergy.  MEDICATIONS:  Current Outpatient Medications  Medication Sig Dispense Refill   acetaminophen (TYLENOL) 325 MG tablet Take 1-2 tablets (325-650 mg total) by mouth every 4 (four) hours as needed for mild pain.     amLODipine (NORVASC) 5 MG tablet TAKE 1 TABLET(5 MG) BY MOUTH DAILY 30 tablet 6   amphetamine-dextroamphetamine (ADDERALL XR) 30 MG 24 hr capsule Take 1 capsule (30 mg total) by mouth daily. 30 capsule 0   amphetamine-dextroamphetamine (ADDERALL) 20 MG tablet Take 20 mg by mouth daily.     aspirin 81 MG EC tablet Take 1 tablet (81 mg total) by mouth daily. Swallow whole. 30 tablet 0   azelastine (ASTELIN) 0.1 % nasal spray Place 1 spray into both nostrils 2 (two) times daily. Use in each nostril as directed 30  mL 12   b complex vitamins capsule Take 1 capsule by mouth daily.     Brivaracetam (BRIVIACT) 50 MG TABS Take 50 mg by mouth 2 (two) times daily. 60 tablet 5   carvedilol (COREG) 3.125 MG tablet TAKE 1 TABLET(3.125 MG) BY MOUTH TWICE DAILY WITH A MEAL 60 tablet 10   cetirizine (ZYRTEC) 10 MG tablet Take 1 tablet (10 mg total) by mouth daily. 30 tablet 11   ezetimibe (ZETIA) 10 MG tablet Take 1 tablet (10 mg total) by mouth daily. 90 tablet 1   finasteride (PROSCAR) 5 MG tablet Take 1 tablet (5 mg total) by mouth daily. 30 tablet 0   hydrALAZINE (APRESOLINE) 10 MG tablet Take 1 tablet (10 mg total) by mouth 3 (three) times daily. 270 tablet 3   hydroxyurea (HYDREA) 500 MG capsule Take 1 capsule (500 mg total) by mouth every other day. May take with food to minimize GI side effects. 90 capsule 1   isosorbide mononitrate (IMDUR) 30 MG 24 hr tablet Take 1 tablet (30 mg total) by mouth daily. 90 tablet 3   levothyroxine (SYNTHROID) 50 MCG tablet Take 50 mcg by mouth daily.     Multiple Vitamin (MULTIVITAMIN WITH MINERALS) TABS tablet Take 1 tablet by mouth daily.     pantoprazole  (PROTONIX) 40 MG tablet TAKE 1 TABLET(40 MG) BY MOUTH DAILY 90 tablet 3   rosuvastatin (CRESTOR) 20 MG tablet TAKE 1 TABLET(20 MG) BY MOUTH DAILY 30 tablet 10   sertraline (ZOLOFT) 50 MG tablet Take 1 tablet (50 mg total) by mouth daily. 30 tablet 4   No current facility-administered medications for this visit.    REVIEW OF SYSTEMS:   Constitutional: ( - ) fevers, ( - )  chills , ( - ) night sweats Eyes: ( - ) blurriness of vision, ( - ) double vision, ( - ) watery eyes Ears, nose, mouth, throat, and face: ( - ) mucositis, ( - ) sore throat Respiratory: ( - ) cough, ( - ) dyspnea, ( - ) wheezes Cardiovascular: ( - ) palpitation, ( - ) chest discomfort, ( - ) lower extremity swelling Gastrointestinal:  ( - ) nausea, ( - ) heartburn, ( - ) change in bowel habits Skin: ( - ) abnormal skin rashes Lymphatics: ( - ) new lymphadenopathy, ( - ) easy bruising Neurological: ( - ) numbness, ( - ) tingling, ( - ) new weaknesses Behavioral/Psych: ( - ) mood change, ( - ) new changes  All other systems were reviewed with the patient and are negative.  PHYSICAL EXAMINATION:  Vitals:   09/03/22 1333  BP: 130/68  Pulse: 69  Resp: 16  Temp: 98.1 F (36.7 C)  SpO2: 100%     Filed Weights   09/03/22 1333  Weight: 168 lb 1.6 oz (76.2 kg)      GENERAL: Well-appearing elderly Caucasian male, alert, no distress and comfortable SKIN: skin color, texture, turgor are normal, no rashes or significant lesions EYES: conjunctiva are pink and non-injected, sclera clear LUNGS: clear to auscultation and percussion with normal breathing effort HEART: regular rate & rhythm and no murmurs and no lower extremity edema Musculoskeletal: no cyanosis of digits and no clubbing  PSYCH: alert & oriented x 3, fluent speech NEURO: no focal motor/sensory deficits  LABORATORY DATA:  I have reviewed the data as listed    Latest Ref Rng & Units 09/03/2022    1:16 PM 07/30/2022    1:40 PM 07/02/2022   12:39 PM  CBC   WBC 4.0 - 10.5 K/uL 6.1  5.8  6.8   Hemoglobin 13.0 - 17.0 g/dL 82.9  56.2  13.0   Hematocrit 39.0 - 52.0 % 43.4  47.0  47.2   Platelets 150 - 400 K/uL 312  491  455        Latest Ref Rng & Units 07/30/2022    1:40 PM 07/02/2022   12:39 PM 03/04/2022    9:39 AM  CMP  Glucose 70 - 99 mg/dL 73  865  784   BUN 8 - 23 mg/dL 22  20  19    Creatinine 0.61 - 1.24 mg/dL 6.96  2.95  2.84   Sodium 135 - 145 mmol/L 146  142  144   Potassium 3.5 - 5.1 mmol/L 4.1  3.7  4.0   Chloride 98 - 111 mmol/L 113  109  109   CO2 22 - 32 mmol/L 27  26  27    Calcium 8.9 - 10.3 mg/dL 9.3  9.2  8.8   Total Protein 6.5 - 8.1 g/dL 6.7  7.1  7.3   Total Bilirubin 0.3 - 1.2 mg/dL 0.3  0.4  0.5   Alkaline Phos 38 - 126 U/L 92  89  87   AST 15 - 41 U/L 23  21  31    ALT 0 - 44 U/L 18  15  25      RADIOGRAPHIC STUDIES: CUP PACEART REMOTE DEVICE CHECK  Result Date: 08/21/2022 ILR summary report received. Battery status OK. Normal device function. No new symptom, tachy, brady, or pause episodes. No new AF episodes. Monthly summary reports and ROV/PRN LA, CVRS  ECHOCARDIOGRAM COMPLETE  Result Date: 08/19/2022    ECHOCARDIOGRAM REPORT   Patient Name:   Gary [name] Date of Exam: 08/19/2022 Medical Rec #:  [id number]        Height:       70.0 in Accession #:    [id number]       Weight:       169.2 lb Date of Birth:  08/20/1945       BSA:          1.944 m Patient Age:    76 years         BP:           142/75 mmHg Patient Gender: M                HR:           82 bpm. Exam Location:  [address] Procedure: 2D Echo, Cardiac Doppler, Color Doppler and 3D Echo Indications:    Cardiac LV ejection fraction 30-35% R94.30  History:        Patient has prior history of Echocardiogram examinations, most                 recent 12/12/2021. CHF, CKD, Signs/Symptoms:Fatigue; Risk                 Factors:Hypertension.  Sonographer:    Thurman Coyer RDCS Referring Phys: [id number] BRIDGETTE CHRISTOPHER IMPRESSIONS  1. Left ventricular  ejection fraction, by estimation, is 35 to 40%. Left ventricular ejection fraction by 3D volume is 39 %. The left ventricle has moderately decreased function. The left ventricle demonstrates global hypokinesis. The left ventricular internal cavity size was mildly dilated. Left ventricular diastolic parameters are consistent with Grade I diastolic dysfunction (impaired relaxation).  2. Right ventricular systolic function is normal. The right ventricular size is normal. There is normal  pulmonary artery systolic pressure.  3. Left atrial size was mildly dilated.  4. The mitral valve is normal in structure. Mild mitral valve regurgitation. No evidence of mitral stenosis.  5. Tricuspid valve regurgitation is mild to moderate.  6. The aortic valve is tricuspid. Aortic valve regurgitation is mild. No aortic stenosis is present.  7. Aortic dilatation noted. There is mild dilatation of the ascending aorta, measuring 40 mm.  8. The inferior vena cava is normal in size with greater than 50% respiratory variability, suggesting right atrial pressure of 3 mmHg. Comparison(s): Prior images reviewed side by side. The left ventricular function has improved. FINDINGS  Left Ventricle: Left ventricular ejection fraction, by estimation, is 35 to 40%. Left ventricular ejection fraction by 3D volume is 39 %. The left ventricle has moderately decreased function. The left ventricle demonstrates global hypokinesis. The left ventricular internal cavity size was mildly dilated. There is no left ventricular hypertrophy. Left ventricular diastolic parameters are consistent with Grade I diastolic dysfunction (impaired relaxation). Right Ventricle: The right ventricular size is normal. No increase in right ventricular wall thickness. Right ventricular systolic function is normal. There is normal pulmonary artery systolic pressure. The tricuspid regurgitant velocity is 2.39 m/s, and  with an assumed right atrial pressure of 3 mmHg, the estimated  right ventricular systolic pressure is 25.8 mmHg. Left Atrium: Left atrial size was mildly dilated. Right Atrium: Right atrial size was normal in size. Pericardium: There is no evidence of pericardial effusion. Mitral Valve: The mitral valve is normal in structure. Mild mitral valve regurgitation. No evidence of mitral valve stenosis. Tricuspid Valve: The tricuspid valve is normal in structure. Tricuspid valve regurgitation is mild to moderate. No evidence of tricuspid stenosis. Aortic Valve: The aortic valve is tricuspid. Aortic valve regurgitation is mild. Aortic regurgitation PHT measures 389 msec. No aortic stenosis is present. Pulmonic Valve: The pulmonic valve was normal in structure. Pulmonic valve regurgitation is mild. No evidence of pulmonic stenosis. Aorta: Aortic dilatation noted. There is mild dilatation of the ascending aorta, measuring 40 mm. Venous: The inferior vena cava is normal in size with greater than 50% respiratory variability, suggesting right atrial pressure of 3 mmHg. IAS/Shunts: No atrial level shunt detected by color flow Doppler.  LEFT VENTRICLE PLAX 2D LVIDd:         6.00 cm         Diastology LVIDs:         4.70 cm         LV e' medial:    4.22 cm/s LV PW:         1.00 cm         LV E/e' medial:  10.7 LV IVS:        0.80 cm         LV e' lateral:   7.14 cm/s LVOT diam:     2.30 cm         LV E/e' lateral: 6.3 LV SV:         60 LV SV Index:   31 LVOT Area:     4.15 cm        3D Volume EF                                LV 3D EF:    Left  ventricul                                             ar                                             ejection                                             fraction                                             by 3D                                             volume is                                             39 %.                                 3D Volume EF:                                3D EF:        39 %                                 LV EDV:       142 ml                                LV ESV:       87 ml                                LV SV:        55 ml RIGHT VENTRICLE RV Basal diam:  3.30 cm RV Mid diam:    3.00 cm RV S prime:     14.80 cm/s TAPSE (M-mode): 2.7 cm LEFT ATRIUM             Index        RIGHT ATRIUM           Index LA diam:        4.10 cm 2.11 cm/m   RA Area:     15.10 cm LA Vol (A2C):   57.5 ml 29.58 ml/m  RA Volume:   33.40 ml  17.18 ml/m LA Vol (A4C):   49.4 ml 25.41 ml/m LA Biplane Vol: 58.3 ml 29.99 ml/m  AORTIC  VALVE LVOT Vmax:   71.10 cm/s LVOT Vmean:  45.400 cm/s LVOT VTI:    0.144 m AI PHT:      389 msec  AORTA Ao Root diam: 3.80 cm Ao Asc diam:  3.95 cm MITRAL VALVE               TRICUSPID VALVE MV Area (PHT): 3.54 cm    TR Peak grad:   22.8 mmHg MV Decel Time: 214 msec    TR Vmax:        239.00 cm/s MV E velocity: 45.00 cm/s MV A velocity: 74.10 cm/s  SHUNTS MV E/A ratio:  0.61        Systemic VTI:  0.14 m                            Systemic Diam: 2.30 cm Donato Schultz MD Electronically signed by Donato Schultz MD Signature Date/Time: 08/19/2022/11:49:53 AM    Final     ASSESSMENT & PLAN Gary [name] is a 77 y.o. male with medical history significant for essential thrombocytosis, JAK2 positive who presents for a follow up visit.   # Essential Thrombocytosis, JAK2 Positive  --Diagnosis confirmed by bone marrow biopsy on 08/05/2021. --target Plt count <400. --recently increased dose of Hydroxyurea 500 mg once a day on Mondays, Wednesday, Fridays and Weekends. Holding Tuesdays and Thursdays --Labs today show WBC 6.1, Hgb 14.6, MCV 87.9, Plt 312K. --Continue with Hydroxyurea without any further dose modifications. Continue ASA 81 mg PO daily --RTC in 4 weeks with lab check and then 12 weeks for labs/follow up.  No orders of the defined types were placed in this encounter.   All questions were answered. The patient knows to call the clinic with any problems, questions or  concerns.  A total of more than 25 minutes were spent on this encounter with face-to-face time and non-face-to-face time, including preparing to see the patient, ordering tests and/or medications, counseling the patient and coordination of care as outlined above.   Georga Kaufmann PA-C Dept of Hematology and Oncology [facility] Phone: [phone]   09/03/2022 1:42 PM

## 2022-09-04 NOTE — Progress Notes (Signed)
[facility] Summary Report / Loop Recorder 

## 2022-09-11 ENCOUNTER — Telehealth: Payer: Self-pay

## 2022-09-11 NOTE — Telephone Encounter (Signed)
Pharmacy Patient Advocate Encounter  Received notification from [facility] that Prior Authorization for Briviact 50MG  tablets  has been APPROVED from 09/11/2022 to 09/11/2023

## 2022-09-11 NOTE — Telephone Encounter (Signed)
*  [facility]  Pharmacy Patient Advocate Encounter   Received notification from Fax that prior authorization for Briviact 50MG  tablets  is required/requested.   Insurance verification completed.   The patient is insured through [facility] .   Per test claim: PA required; PA submitted to [facility] via CoverMyMeds Key/confirmation #/EOC [facility] Status is pending

## 2022-09-12 ENCOUNTER — Encounter: Payer: Self-pay | Admitting: Physical Medicine & Rehabilitation

## 2022-09-12 DIAGNOSIS — F900 Attention-deficit hyperactivity disorder, predominantly inattentive type: Secondary | ICD-10-CM

## 2022-09-12 DIAGNOSIS — Z87820 Personal history of traumatic brain injury: Secondary | ICD-10-CM

## 2022-09-12 MED ORDER — AMPHETAMINE-DEXTROAMPHETAMINE 20 MG PO TABS: 20.0000 mg | ORAL_TABLET | Freq: Every day | ORAL | 0 refills | Status: DC

## 2022-09-12 MED ORDER — AMPHETAMINE-DEXTROAMPHET ER 30 MG PO CP24: 30.0000 mg | ORAL_CAPSULE | Freq: Every day | ORAL | 0 refills | Status: DC

## 2022-09-12 MED ORDER — AMPHETAMINE-DEXTROAMPHET ER 30 MG PO CP24
30.0000 mg | ORAL_CAPSULE | Freq: Every day | ORAL | 0 refills | Status: DC
Start: 2022-09-12 — End: 2022-12-03

## 2022-09-12 NOTE — Telephone Encounter (Signed)
Rx's sent out for this month and next. Make sure pharmacy knows that October rx's were sent, too.   thx

## 2022-09-29 ENCOUNTER — Ambulatory Visit (INDEPENDENT_AMBULATORY_CARE_PROVIDER_SITE_OTHER): Payer: 59

## 2022-09-29 DIAGNOSIS — I429 Cardiomyopathy, unspecified: Secondary | ICD-10-CM

## 2022-10-01 ENCOUNTER — Inpatient Hospital Stay: Payer: 59 | Attending: Hematology and Oncology

## 2022-10-01 DIAGNOSIS — D473 Essential (hemorrhagic) thrombocythemia: Secondary | ICD-10-CM | POA: Diagnosis present

## 2022-10-01 LAB — CBC WITH DIFFERENTIAL (CANCER CENTER ONLY)
Abs Immature Granulocytes: 0.01 10*3/uL (ref 0.00–0.07)
Basophils Absolute: 0.1 10*3/uL (ref 0.0–0.1)
Basophils Relative: 1 %
Eosinophils Absolute: 0.2 10*3/uL (ref 0.0–0.5)
Eosinophils Relative: 3 %
HCT: 48.4 % (ref 39.0–52.0)
Hemoglobin: 16.4 g/dL (ref 13.0–17.0)
Immature Granulocytes: 0 %
Lymphocytes Relative: 16 %
Lymphs Abs: 1.1 10*3/uL (ref 0.7–4.0)
MCH: 30.4 pg (ref 26.0–34.0)
MCHC: 33.9 g/dL (ref 30.0–36.0)
MCV: 89.6 fL (ref 80.0–100.0)
Monocytes Absolute: 0.5 10*3/uL (ref 0.1–1.0)
Monocytes Relative: 8 %
Neutro Abs: 4.8 10*3/uL (ref 1.7–7.7)
Neutrophils Relative %: 72 %
Platelet Count: 394 10*3/uL (ref 150–400)
RBC: 5.4 MIL/uL (ref 4.22–5.81)
RDW: 16.2 % — ABNORMAL HIGH (ref 11.5–15.5)
WBC Count: 6.7 10*3/uL (ref 4.0–10.5)
nRBC: 0 % (ref 0.0–0.2)

## 2022-10-01 LAB — CMP (CANCER CENTER ONLY)
ALT: 16 U/L (ref 0–44)
AST: 24 U/L (ref 15–41)
Albumin: 4 g/dL (ref 3.5–5.0)
Alkaline Phosphatase: 100 U/L (ref 38–126)
Anion gap: 7 (ref 5–15)
BUN: 23 mg/dL (ref 8–23)
CO2: 27 mmol/L (ref 22–32)
Calcium: 9.5 mg/dL (ref 8.9–10.3)
Chloride: 111 mmol/L (ref 98–111)
Creatinine: 1.74 mg/dL — ABNORMAL HIGH (ref 0.61–1.24)
GFR, Estimated: 41 mL/min — ABNORMAL LOW (ref >60)
Glucose, Bld: 82 mg/dL (ref 70–99)
Potassium: 4.3 mmol/L (ref 3.5–5.1)
Sodium: 145 mmol/L (ref 135–145)
Total Bilirubin: 0.5 mg/dL (ref 0.3–1.2)
Total Protein: 7.4 g/dL (ref 6.5–8.1)

## 2022-10-13 NOTE — Progress Notes (Signed)
[facility] Summary Report / Loop Recorder 

## 2022-10-22 ENCOUNTER — Encounter
Payer: No Typology Code available for payment source | Attending: Physical Medicine & Rehabilitation | Admitting: Physical Medicine & Rehabilitation

## 2022-10-22 ENCOUNTER — Encounter: Payer: Self-pay | Admitting: Physical Medicine & Rehabilitation

## 2022-10-22 DIAGNOSIS — F329 Major depressive disorder, single episode, unspecified: Secondary | ICD-10-CM | POA: Diagnosis present

## 2022-10-22 MED ORDER — SERTRALINE HCL 100 MG PO TABS
100.0000 mg | ORAL_TABLET | Freq: Every day | ORAL | 5 refills | Status: DC
Start: 2022-10-22 — End: 2023-05-20

## 2022-10-22 MED ORDER — LEVOTHYROXINE SODIUM 75 MCG PO TABS: 75.0000 ug | ORAL_TABLET | Freq: Every day | ORAL | 4 refills | Status: AC

## 2022-10-22 NOTE — Progress Notes (Signed)
Subjective:    Patient ID: Gary [name], male    DOB: Nov 12, 1945, 78 y.o.   MRN: [id number]  HPI  [name] is here in follow up of his TBI. He still reports problem with memory.  I asked him how he has been sleeping.  He states he gets about 8 to 10 hours per night.  He may wake up once or twice to urinate.  He denies any restlessness or bad dreams.  When he gets up in the morning he just feels that he does not have energy.  He did not get the gym membership to [facility] as we had recommended after his last visit.  He was given a Research scientist (physical sciences) to [employer].  He was hesitant to go to a gym which is high pace like this and caters to a younger crowd in general.   From a mood standpoint, we changed him to zoloft after last visit. He hasn't noticed much of a difference thus far. He has followed up with [facility].  He has further counsel pending.  He remains on Adderall as prescribed.  His family doctor checked thyroid studies in July and TSH was 2.4 and free T4 was 1.07.  He has not had vitamin b levels checked for some time not sure when the last time he has had vitamin D checked.  Pain Inventory Average Pain 3 Pain Right Now 5 My pain is intermittent and aching  LOCATION OF PAIN  lower back  BOWEL Number of stools per week: 6-7   BLADDER Normal  Difficulty starting stream Yes     Mobility use a cane ability to climb steps?  yes do you drive?  no Do you have any goals in this area?  yes  Function I need assistance with the following:  meal prep, household duties, and shopping  Neuro/Psych trouble walking dizziness confusion depression anxiety  Prior Studies Any changes since last visit?  no  Physicians involved in your care Any changes since last visit?  no   Family History  Problem Relation Age of Onset   Heart disease Mother    Hypertension Mother    Stroke Mother    Polycythemia Father    Polycythemia Brother    Leukemia Brother     Social History   Socioeconomic History   Marital status: Married    Spouse name: [name]   Number of children: Not on file   Years of education: Not on file   Highest education level: Not on file  Occupational History   Occupation: Quarry manager    Comment: [employer]  Tobacco Use   Smoking status: Former    Types: Cigarettes   Smokeless tobacco: Never  Vaping Use   Vaping status: Never Used  Substance and Sexual Activity   Alcohol use: Not Currently   Drug use: Never   Sexual activity: Yes  Other Topics Concern   Not on file  Social History Narrative   ** Merged History Encounter **       Social Determinants of Health   Financial Resource Strain: Low Risk  (02/19/2022)   Received from [employer], [facility]   Overall Financial Resource Strain (CARDIA)    Difficulty of Paying Living Expenses: Not very hard  Food Insecurity: No Food Insecurity (02/19/2022)   Received from [facility], [facility]   Hunger Vital Sign    Worried About Running Out of Food in the Last Year: Never true    Ran Out of Food  in the Last Year: Never true  Transportation Needs: No Transportation Needs (02/19/2022)   Received from [facility], [facility]   [facility] - Transportation    Lack of Transportation (Medical): No    Lack of Transportation (Non-Medical): No  Physical Activity: Unknown (02/19/2022)   Received from [facility], [facility]   Exercise Vital Sign    Days of Exercise per Week: 0 days    Minutes of Exercise per Session: Not on file  Stress: Stress Concern Present (02/19/2022)   Received from [facility], [facility] of Occupational Health - Occupational Stress Questionnaire    Feeling of Stress : To some extent  Social Connections: Somewhat Isolated (02/19/2022)   Received from [facility], [facility]   Social Network    How would you rate your social network (family, work, friends)?: Restricted participation with  some degree of social isolation   Past Surgical History:  Procedure Laterality Date   LOOP RECORDER INSERTION N/A 04/26/2021   Procedure: LOOP RECORDER INSERTION;  Surgeon: Thurmon Fair, MD;  [facility]: [facility];  Service: Cardiovascular;  Laterality: N/A;   MINOR REMOVAL OF MANDIBULAR HARDWARE N/A 12/15/2019   Procedure: REMOVAL OF RIGHT LATERAL ORBITAL MINIPLATE;  Surgeon: Peggye Form, DO;  Location: [facility] OR;  Service: Plastics;  Laterality: N/A;   ORIF MANDIBULAR FRACTURE Bilateral 07/27/2019   Procedure: OPEN REDUCTION INTERNAL FIXATION (ORIF) OF COMPLEX ZYGOMATIC FRACTURE;  Surgeon: Peggye Form, DO;  Location: [facility] OR;  Service: Plastics;  Laterality: Bilateral;  2 hours, please   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   Trauma    BP (!) 163/69   Pulse 71   Ht 5\' 10"  (1.778 m)   Wt 164 lb (74.4 kg)   SpO2 94%   BMI 23.53 kg/m   Opioid Risk Score:   Fall Risk Score:  `1  Depression screen PHQ 2/9     02/19/2022    1:28 PM 12/04/2021   10:55 AM 05/29/2021   11:07 AM 01/30/2021   11:37 AM 10/26/2020    9:44 AM 10/03/2020   11:40 AM 03/21/2020   10:11 AM  Depression screen PHQ 2/9  Decreased Interest 1 1 0  1 2 1   Down, Depressed, Hopeless 1 1 0 1 1 2 1   PHQ - 2 Score 2 2 0 1 2 4 2   Altered sleeping     0    Tired, decreased energy     3    Change in appetite     0    Feeling bad or failure about yourself      0    Trouble concentrating     3    Moving slowly or fidgety/restless     2    Suicidal thoughts     0    PHQ-9 Score     10    Difficult doing work/chores     Somewhat difficult        Review  of Systems  Musculoskeletal:  Positive for back pain  and gait problem.  All other systems reviewed and are negative.     Objective:   Physical Exam General: No acute distress HEENT: NCAT, EOMI, oral membranes moist Cards: reg rate  Chest: normal effort Abdomen: Soft, NT, ND Skin: dry, intact Extremities: no edema Psych: Cooperative but flat.  Tends to ruminate on fatigue and other health issues that he deals with. Skin: hands sl cool Neuro: Patient is alert and oriented to month but not the date or year.   He is able to follow basic commands.  Insight is fair to reasonable.  He can be slow with his processing is able to attend to conversation fairly well.  Cranial nerve exam was somewhat nonfocal.  Strength is grossly 5 out of 5.     Gait was fairly steady.  No focal limb ataxia was seen.  No gross sensory abnormalities. Uses cane for balance. Tends to lean forward without cane.  Balance was fair Musculoskeletal: Lumbar spine tends to flex forward and stands.  He does sit fairly extended while sitting.    PLAN: 1. Functional deficits secondary to left MCA scattered small infarcts, Hx of TBI 2 years ago             -pt with memory/higher level processing deficits. Mood issues as well.  I feel that his mood plays a bigger and bigger role. - have recommend a gym membership for physical, psycho-social, and cognitive stimulation---will make it specific for [facility] as this is the most appropriate venue for him given his TBI 2.  Antithrombotics: On ASA alone 3. Pain in toes/feet/fingers/raynaud like appearance as well as low back pain:  -?some improvement with amlodipine---continue 5mg  -keep hands and feet warms, covered when possible.   May have more issues now that is getting cooler outside  -tylenol prn -home stretching program should continue 4. Depression/fatigue: LCSW to evaluate and provide emotional support             -Increase Zoloft to 100 mg daily             -keep Adderall XR  to 30 mg daily mg daily.   -Continue Adderall immediate release 20 mg daily in the afternoon             -continue f/u [facility] soon             -Encouraged exercise and socialization as I have previously.  I really tried to emphasize it today.             -Given thyroid studies from the summer and current complaints, will increase Synthroid to 75 mcg daily   -At next visit recheck thyroid studies and consider vitamin B panel as well as vitamin D levels.               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 Insomnia             Seems improved             -voiding before bed, PM "fluid rationing" 18. Mild thrombocytosis:             -  Recent platelet count 353k!     30+ minutes of face to face patient care time were spent during this visit. All questions were encouraged and answered.     Follow up with me in 3 MOS.

## 2022-10-22 NOTE — Patient Instructions (Addendum)
ALWAYS FEEL FREE TO CALL OUR OFFICE WITH ANY PROBLEMS OR QUESTIONS [phone])  **PLEASE NOTE** ALL MEDICATION REFILL REQUESTS (INCLUDING CONTROLLED SUBSTANCES) NEED TO BE MADE AT LEAST 7 DAYS PRIOR TO REFILL BEING DUE. ANY REFILL REQUESTS INSIDE THAT TIME FRAME MAY RESULT IN DELAYS IN RECEIVING YOUR PRESCRIPTION.    INCREASE SYNTHROID TO DAILY   INCREASE ZOLOFT TO 100MG  DAILY

## 2022-11-03 ENCOUNTER — Ambulatory Visit (INDEPENDENT_AMBULATORY_CARE_PROVIDER_SITE_OTHER): Payer: 59

## 2022-11-03 DIAGNOSIS — Z8673 Personal history of transient ischemic attack (TIA), and cerebral infarction without residual deficits: Secondary | ICD-10-CM | POA: Diagnosis not present

## 2022-11-04 ENCOUNTER — Ambulatory Visit: Payer: Medicare Other | Attending: Physical Medicine & Rehabilitation | Admitting: Speech Pathology

## 2022-11-04 LAB — CUP PACEART REMOTE DEVICE CHECK
Date Time Interrogation Session: 20241027231344
Implantable Pulse Generator Implant Date: 20230421
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
## 2022-11-18 ENCOUNTER — Encounter: Payer: Self-pay | Admitting: Adult Health

## 2022-11-18 ENCOUNTER — Ambulatory Visit (INDEPENDENT_AMBULATORY_CARE_PROVIDER_SITE_OTHER): Payer: 59 | Admitting: Adult Health

## 2022-11-18 VITALS — BP 138/80 | HR 82 | Ht 70.0 in | Wt 165.4 lb

## 2022-11-18 DIAGNOSIS — I639 Cerebral infarction, unspecified: Secondary | ICD-10-CM

## 2022-11-18 DIAGNOSIS — R569 Unspecified convulsions: Secondary | ICD-10-CM

## 2022-11-18 DIAGNOSIS — G3189 Other specified degenerative diseases of nervous system: Secondary | ICD-10-CM | POA: Diagnosis not present

## 2022-11-18 DIAGNOSIS — I63412 Cerebral infarction due to embolism of left middle cerebral artery: Secondary | ICD-10-CM | POA: Diagnosis not present

## 2022-11-18 DIAGNOSIS — F09 Unspecified mental disorder due to known physiological condition: Secondary | ICD-10-CM

## 2022-11-18 DIAGNOSIS — R4189 Other symptoms and signs involving cognitive functions and awareness: Secondary | ICD-10-CM | POA: Diagnosis not present
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
## 2022-11-18 NOTE — Patient Instructions (Signed)
Continue to monitor memory, suspect worsening likely due to mood, continue to follow with [facility] and follow up with Dr. Kieth Brightly this week. Agree with increasing physical activity as this can help mood and memory as well as ensuring routine cognitive exercises, ensuring good sleep and healthy diet   Continue Briviact 50mg  twice daily for seizure prevention  Continue aspirin 81 mg daily  and Crestor and Zetia  for secondary stroke prevention  Continue to follow up with PCP regarding blood pressure and cholesterol management  Maintain strict control of hypertension with blood pressure goal below 130/90, diabetes with hemoglobin A1c goal below 7.0 % and cholesterol with LDL cholesterol (bad cholesterol) goal below 70 mg/dL.   Signs of a Stroke? Follow the BEFAST method:  Balance Watch for a sudden loss of balance, trouble with coordination or vertigo Eyes Is there a sudden loss of vision in one or both eyes? Or double vision?  Face: Ask the person to smile. Does one side of the face droop or is it numb?  Arms: Ask the person to raise both arms. Does one arm drift downward? Is there weakness or numbness of a leg? Speech: Ask the person to repeat a simple phrase. Does the speech sound slurred/strange? Is the person confused ? Time: If you observe any of these signs, call 911.    Followup in the future with me in 6 months or call earlier if needed       Thank you for coming to see [place] at [facility]. I hope we have been able to provide you high quality care today.  You may receive a patient satisfaction survey over the next few weeks. We would appreciate your feedback and comments so that we may continue to improve ourselves and the health of our patients.

## 2022-11-20 ENCOUNTER — Encounter
Payer: No Typology Code available for payment source | Attending: Physical Medicine & Rehabilitation | Admitting: Psychology

## 2022-11-20 DIAGNOSIS — F09 Unspecified mental disorder due to known physiological condition: Secondary | ICD-10-CM | POA: Diagnosis present

## 2022-11-20 DIAGNOSIS — Z87820 Personal history of traumatic brain injury: Secondary | ICD-10-CM | POA: Diagnosis present

## 2022-11-20 DIAGNOSIS — S069XAS Unspecified intracranial injury with loss of consciousness status unknown, sequela: Secondary | ICD-10-CM | POA: Diagnosis present

## 2022-11-20 DIAGNOSIS — F329 Major depressive disorder, single episode, unspecified: Secondary | ICD-10-CM | POA: Diagnosis present

## 2022-11-20 DIAGNOSIS — G3189 Other specified degenerative diseases of nervous system: Secondary | ICD-10-CM | POA: Diagnosis present

## 2022-11-21 NOTE — Progress Notes (Signed)
[facility] Summary Report / Loop Recorder 

## 2022-12-03 ENCOUNTER — Encounter: Payer: Self-pay | Admitting: Physical Medicine & Rehabilitation

## 2022-12-03 ENCOUNTER — Inpatient Hospital Stay (HOSPITAL_BASED_OUTPATIENT_CLINIC_OR_DEPARTMENT_OTHER): Payer: 59 | Admitting: Hematology and Oncology

## 2022-12-03 ENCOUNTER — Other Ambulatory Visit: Payer: Self-pay | Admitting: Hematology and Oncology

## 2022-12-03 ENCOUNTER — Inpatient Hospital Stay: Payer: 59 | Attending: Hematology and Oncology

## 2022-12-03 VITALS — BP 136/59 | HR 63 | Temp 98.2°F | Resp 16 | Ht 70.0 in | Wt 161.9 lb

## 2022-12-03 DIAGNOSIS — D473 Essential (hemorrhagic) thrombocythemia: Secondary | ICD-10-CM

## 2022-12-03 DIAGNOSIS — F900 Attention-deficit hyperactivity disorder, predominantly inattentive type: Secondary | ICD-10-CM

## 2022-12-03 DIAGNOSIS — D75839 Thrombocytosis, unspecified: Secondary | ICD-10-CM | POA: Insufficient documentation

## 2022-12-03 DIAGNOSIS — Z1589 Genetic susceptibility to other disease: Secondary | ICD-10-CM | POA: Diagnosis not present

## 2022-12-03 DIAGNOSIS — Z87820 Personal history of traumatic brain injury: Secondary | ICD-10-CM

## 2022-12-03 LAB — CBC WITH DIFFERENTIAL (CANCER CENTER ONLY)
Abs Immature Granulocytes: 0.02 10*3/uL (ref 0.00–0.07)
Basophils Absolute: 0.1 10*3/uL (ref 0.0–0.1)
Basophils Relative: 1 %
Eosinophils Absolute: 0.2 10*3/uL (ref 0.0–0.5)
Eosinophils Relative: 3 %
HCT: 47.8 % (ref 39.0–52.0)
Hemoglobin: 15.8 g/dL (ref 13.0–17.0)
Immature Granulocytes: 0 %
Lymphocytes Relative: 14 %
Lymphs Abs: 0.9 10*3/uL (ref 0.7–4.0)
MCH: 31.3 pg (ref 26.0–34.0)
MCHC: 33.1 g/dL (ref 30.0–36.0)
MCV: 94.7 fL (ref 80.0–100.0)
Monocytes Absolute: 0.5 10*3/uL (ref 0.1–1.0)
Monocytes Relative: 8 %
Neutro Abs: 4.5 10*3/uL (ref 1.7–7.7)
Neutrophils Relative %: 74 %
Platelet Count: 364 10*3/uL (ref 150–400)
RBC: 5.05 MIL/uL (ref 4.22–5.81)
RDW: 15.1 % (ref 11.5–15.5)
WBC Count: 6 10*3/uL (ref 4.0–10.5)
nRBC: 0 % (ref 0.0–0.2)

## 2022-12-03 LAB — CMP (CANCER CENTER ONLY)
ALT: 13 U/L (ref 0–44)
AST: 21 U/L (ref 15–41)
Albumin: 3.8 g/dL (ref 3.5–5.0)
Alkaline Phosphatase: 92 U/L (ref 38–126)
Anion gap: 5 (ref 5–15)
BUN: 25 mg/dL — ABNORMAL HIGH (ref 8–23)
CO2: 28 mmol/L (ref 22–32)
Calcium: 9.3 mg/dL (ref 8.9–10.3)
Chloride: 111 mmol/L (ref 98–111)
Creatinine: 1.86 mg/dL — ABNORMAL HIGH (ref 0.61–1.24)
GFR, Estimated: 37 mL/min — ABNORMAL LOW (ref >60)
Glucose, Bld: 83 mg/dL (ref 70–99)
Potassium: 4.4 mmol/L (ref 3.5–5.1)
Sodium: 144 mmol/L (ref 135–145)
Total Bilirubin: 0.4 mg/dL (ref <1.2)
Total Protein: 7.1 g/dL (ref 6.5–8.1)

## 2022-12-03 MED ORDER — AMPHETAMINE-DEXTROAMPHET ER 30 MG PO CP24: 30.0000 mg | ORAL_CAPSULE | Freq: Every day | ORAL | 0 refills | Status: DC

## 2022-12-03 MED ORDER — AMPHETAMINE-DEXTROAMPHET ER 30 MG PO CP24
30.0000 mg | ORAL_CAPSULE | Freq: Every day | ORAL | 0 refills | Status: DC
Start: 2022-12-03 — End: 2023-02-26

## 2022-12-03 MED ORDER — AMPHETAMINE-DEXTROAMPHETAMINE 20 MG PO TABS: 20.0000 mg | ORAL_TABLET | Freq: Every day | ORAL | 0 refills | Status: DC

## 2022-12-03 NOTE — Progress Notes (Signed)
[facility] [phone]   [phone]  PROGRESS NOTE  Patient Care Team: Stevphen Rochester, MD as PCP - General (Family Medicine) Jodelle Red, MD as PCP - Cardiology (Cardiology) Noel Journey (Family Medicine) Jodelle Red, MD as Consulting Physician (Cardiology)  Hematological/Oncological History # Essential Thrombocytosis, JAK2 Positive  05/31/2021: establish care with Dr. Leonides Schanz for thrombocytosis. Found to be JAK2 positive.  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  The differential diagnosis includes essential thrombocythemia and early phase of primary myelofibrosis. 09/10/2021: start hydroxyurea 500 mg QOD. Was to start on 8/4 but patient was hesitant.  10/15/2021: WBC 7.7, Hgb 16.0, MCV 85.5, Plt 404 12/10/2021: WBC 6.1, Hgb 15.6, MCV 90.5, Plt 351 07/02/2022: WBC 6.8, Hgb 15.6, MCV 88.7, Plt 455K.  07/30/2022: WBC 5.8, Hgb 15.5, MCV 87.7, Plt 491K. Increased dose of Hydroxyurea 500 mg once a day on Mondays, Wednesday, Fridays and Weekends. Holding Tuesdays and Thursdays 09/03/2022: WBC 6.1, Hgb 14.6, MCV 87.9, Plt 312K  Interval History:  Camerin Mclaney 77 y.o. male with medical history significant for essential thrombocytosis, JAK2 positive who presents for a follow up visit. The patient's last visit was on 09/03/2022. In the interim since the last visit he has continued hydroxyurea therapy.   On exam today Mr. Mickel is accompanied by his caregiver and his wife on the telephone.  He reports he is had no major changes in his health in the interim since our last visit.  He reports he may be increased his dose of antidepressant.  He is taking his hydroxyurea 5 times per week, normally dropping the dose on Tuesdays and Thursdays.  He reports he is not having any trouble with the medication such as stomach upset or nausea, vomiting, or diarrhea.  The  patient reports he is about 2-3 bowel movements per day and they are normally formed.  He does have some occasional urgency.  He notes he is taking his 81 mg p.o. daily aspirin with no bleeding, bruising, or dark stools.  He does not have any signs or symptoms concerning for VTE such as leg pain, leg swelling, chest pain, or shortness of breath.  He reports that his energy levels have been fair and he currently reports he about a 4 or 5 out of 10.  His appetite has been good.  He does not have any dizziness or shortness of breath but does have some occasional lightheadedness.  He denies easy bruising or signs of active bleeding. He denies fevers, chills,sweats, shortness of breath, chest pain or cough. He has no other complaints. Full 10 point ROS is listed below.  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   Trauma     SURGICAL HISTORY: Past Surgical History:  Procedure Laterality Date   LOOP RECORDER INSERTION N/A 04/26/2021   Procedure: LOOP RECORDER INSERTION;  Surgeon: Thurmon Fair, MD;  Location: [facility] INVASIVE CV LAB;  Service: Cardiovascular;  Laterality: N/A;   MINOR REMOVAL OF MANDIBULAR HARDWARE N/A 12/15/2019   Procedure: REMOVAL  OF RIGHT LATERAL ORBITAL MINIPLATE;  Surgeon: Peggye Form, DO;  Location: [facility] OR;  Service: Plastics;  Laterality: N/A;   ORIF MANDIBULAR FRACTURE Bilateral 07/27/2019   Procedure: OPEN REDUCTION INTERNAL FIXATION (ORIF) OF COMPLEX ZYGOMATIC FRACTURE;  Surgeon: Peggye Form, DO;  Location: [facility] OR;  Service: Plastics;  Laterality: Bilateral;  2 hours, please    SOCIAL HISTORY: Social  History   Socioeconomic History   Marital status: Married    Spouse name: Kenyan Muzik   Number of children: Not on file   Years of education: Not on file   Highest education level: Not on file  Occupational History   Occupation: Quarry manager    Comment: [employer]  Tobacco Use   Smoking status: Former    Types: Cigarettes   Smokeless tobacco: Never  Vaping Use   Vaping status: Never Used  Substance and Sexual Activity   Alcohol use: Not Currently   Drug use: Never   Sexual activity: Yes  Other Topics Concern   Not on file  Social History Narrative   ** Merged History Encounter **       Social Determinants of Health   Financial Resource Strain: Low Risk  (02/19/2022)   Received from [facility], [facility]   Overall Financial Resource Strain (CARDIA)    Difficulty of Paying Living Expenses: Not very hard  Food Insecurity: No Food Insecurity (02/19/2022)   Received from [facility], [facility]   Hunger Vital Sign    Worried About Running Out of Food in the Last Year: Never true    Ran Out of Food in the Last Year: Never true  Transportation Needs: No Transportation Needs (02/19/2022)   Received from [facility], [facility]   PRAPARE - Transportation    Lack of Transportation (Medical): No    Lack of Transportation (Non-Medical): No  Physical Activity: Unknown (02/19/2022)   Received from [facility], [facility]   Exercise Vital Sign    Days of Exercise per Week: 0 days    Minutes of Exercise per Session: Not on file  Stress: Stress Concern Present (02/19/2022)   Received from [facility], [facility] - Occupational Stress Questionnaire    Feeling of Stress : To some extent  Social Connections: Somewhat Isolated (02/19/2022)   Received from [facility], [facility]   Social Network    How would you rate your social network (family, work, friends)?: Restricted participation with some degree  of social isolation  Intimate Partner Violence: Not At Risk (02/19/2022)   Received [facility], [facility]   HITS    Over the last 12 months how often did your partner physically hurt you?: Never    Over the last 12 months how often did your partner insult you or talk down to you?: Never    Over the last 12 months how often did your partner threaten you with physical harm?: Never    Over the last 12 months how often did your partner scream or curse at you?: Rarely    FAMILY HISTORY: Family History  Problem Relation Age of Onset   Heart disease Mother    Hypertension Mother    Stroke Mother    Polycythemia Father    Polycythemia Brother    Leukemia Brother     ALLERGIES:  is allergic to levofloxacin, atomoxetine hcl, and shellfish allergy.  MEDICATIONS:  Current Outpatient Medications  Medication Sig Dispense Refill   acetaminophen (  TYLENOL) 325 MG tablet Take 1-2 tablets (325-650 mg total) by mouth every 4 (four) hours as needed for mild pain.     amLODipine (NORVASC) 5 MG tablet TAKE 1 TABLET(5 MG) BY MOUTH DAILY 30 tablet 6   amphetamine-dextroamphetamine (ADDERALL XR) 30 MG 24 hr capsule Take 1 capsule (30 mg total) by mouth daily. 30 capsule 0   amphetamine-dextroamphetamine (ADDERALL XR) 30 MG 24 hr capsule Take 1 capsule (30 mg total) by mouth daily. 30 capsule 0   amphetamine-dextroamphetamine (ADDERALL) 20 MG tablet Take 1 tablet (20 mg total) by mouth daily. 30 tablet 0   amphetamine-dextroamphetamine (ADDERALL) 20 MG tablet Take 1 tablet (20 mg total) by mouth daily. 30 tablet 0   aspirin 81 MG EC tablet Take 1 tablet (81 mg total) by mouth daily. Swallow whole. 30 tablet 0   azelastine (ASTELIN) 0.1 % nasal spray Place 1 spray into both nostrils 2 (two) times daily. Use in each nostril as directed 30 mL 12   b complex vitamins capsule Take 1 capsule by mouth daily.     Brivaracetam (BRIVIACT) 50 MG TABS Take 50 mg by mouth 2 (two) times daily. 60 tablet 5    carvedilol (COREG) 3.125 MG tablet TAKE 1 TABLET(3.125 MG) BY MOUTH TWICE DAILY WITH A MEAL 60 tablet 10   cetirizine (ZYRTEC) 10 MG tablet Take 1 tablet (10 mg total) by mouth daily. 30 tablet 11   ezetimibe (ZETIA) 10 MG tablet Take 1 tablet (10 mg total) by mouth daily. 90 tablet 1   finasteride (PROSCAR) 5 MG tablet Take 1 tablet (5 mg total) by mouth daily. 30 tablet 0   hydrALAZINE (APRESOLINE) 10 MG tablet Take 1 tablet (10 mg total) by mouth 3 (three) times daily. 270 tablet 3   hydroxyurea (HYDREA) 500 MG capsule Take 1 tablet (500 mg) on Mondays, Wednesdays, Fridays and Weekends. Holding on Tuesdays and Thursdays.  May take with food to minimize GI side effects. 90 capsule 1   isosorbide mononitrate (IMDUR) 30 MG 24 hr tablet Take 1 tablet (30 mg total) by mouth daily. 90 tablet 3   levothyroxine (SYNTHROID) 75 MCG tablet Take 1 tablet (75 mcg total) by mouth daily before breakfast. 30 tablet 4   Multiple Vitamin (MULTIVITAMIN WITH MINERALS) TABS tablet Take 1 tablet by mouth daily.     pantoprazole (PROTONIX) 40 MG tablet TAKE 1 TABLET(40 MG) BY MOUTH DAILY 90 tablet 3   rosuvastatin (CRESTOR) 20 MG tablet TAKE 1 TABLET(20 MG) BY MOUTH DAILY 30 tablet 10   sertraline (ZOLOFT) 100 MG tablet Take 1 tablet (100 mg total) by mouth daily. 30 tablet 5   No current facility-administered medications for this visit.    REVIEW OF SYSTEMS:   Constitutional: ( - ) fevers, ( - )  chills , ( - ) night sweats Eyes: ( - ) blurriness of vision, ( - ) double vision, ( - ) watery eyes Ears, nose, mouth, throat, and face: ( - ) mucositis, ( - ) sore throat Respiratory: ( - ) cough, ( - ) dyspnea, ( - ) wheezes Cardiovascular: ( - ) palpitation, ( - ) chest discomfort, ( - ) lower extremity swelling Gastrointestinal:  ( - ) nausea, ( - ) heartburn, ( - ) change in bowel habits Skin: ( - ) abnormal skin rashes Lymphatics: ( - ) new lymphadenopathy, ( - ) easy bruising Neurological: ( - ) numbness, ( -  ) tingling, ( - ) new weaknesses Behavioral/Psych: ( - )  mood change, ( - ) new changes  All other systems were reviewed with the patient and are negative.  PHYSICAL EXAMINATION:  Vitals:   12/03/22 1444 12/03/22 1449  BP: (!) 170/84 (!) 136/59  Pulse: 63   Resp: 16   Temp: 98.2 F (36.8 C)   SpO2: 99%      Filed Weights   12/03/22 1444  Weight: 161 lb 14.4 oz (73.4 kg)      GENERAL: Well-appearing elderly Caucasian male, alert, no distress and comfortable SKIN: skin color, texture, turgor are normal, no rashes or significant lesions EYES: conjunctiva are pink and non-injected, sclera clear LUNGS: clear to auscultation and percussion with normal breathing effort HEART: regular rate & rhythm and no murmurs and no lower extremity edema Musculoskeletal: no cyanosis of digits and no clubbing  PSYCH: alert & oriented x 3, fluent speech NEURO: no focal motor/sensory deficits  LABORATORY DATA:  I have reviewed the data as listed    Latest Ref Rng & Units 12/03/2022    2:13 PM 10/01/2022    2:04 PM 09/03/2022    1:16 PM  CBC  WBC 4.0 - 10.5 K/uL 6.0  6.7  6.1   Hemoglobin 13.0 - 17.0 g/dL 65.7  84.6  96.2   Hematocrit 39.0 - 52.0 % 47.8  48.4  43.4   Platelets 150 - 400 K/uL 364  394  312        Latest Ref Rng & Units 12/03/2022    2:13 PM 10/01/2022    2:04 PM 09/03/2022    1:16 PM  CMP  Glucose 70 - 99 mg/dL 83  82  94   BUN 8 - 23 mg/dL 25  23  21    Creatinine 0.61 - 1.24 mg/dL 9.52  8.41  3.24   Sodium 135 - 145 mmol/L 144  145  144   Potassium 3.5 - 5.1 mmol/L 4.4  4.3  3.8   Chloride 98 - 111 mmol/L 111  111  110   CO2 22 - 32 mmol/L 28  27  29    Calcium 8.9 - 10.3 mg/dL 9.3  9.5  9.0   Total Protein 6.5 - 8.1 g/dL 7.1  7.4  6.7   Total Bilirubin <1.2 mg/dL 0.4  0.5  0.4   Alkaline Phos 38 - 126 U/L 92  100  90   AST 15 - 41 U/L 21  24  22    ALT 0 - 44 U/L 13  16  15      RADIOGRAPHIC STUDIES: No results found.  ASSESSMENT & PLAN Gowtham Buggy is a  77 y.o. male with medical history significant for essential thrombocytosis, JAK2 positive who presents for a follow up visit.   # Essential Thrombocytosis, JAK2 Positive  --Diagnosis confirmed by bone marrow biopsy on 08/05/2021. --target Plt count <400.  Patient currently at target --recently increased dose of Hydroxyurea 500 mg once a day on Mondays, Wednesday, Fridays and Weekends. Holding Tuesdays and Thursdays --Labs today show WBC 6.0, Hgb 15.8, MCV 94.7, Plt 364 --Continue with Hydroxyurea dosed as noted above. Continue ASA 81 mg PO daily --RTC in 12 weeks for labs/follow up.  No orders of the defined types were placed in this encounter.   All questions were answered. The patient knows to call the clinic with any problems, questions or concerns.  A total of more than 25 minutes were spent on this encounter with face-to-face time and non-face-to-face time, including preparing to see the patient, ordering tests and/or medications,  counseling the patient and coordination of care as outlined above.   Ulysees Barns, MD Department of Hematology/Oncology [facility] Phone: [phone] Pager: [phone] Email: [email or web address]    12/03/2022 3:56 PM

## 2022-12-03 NOTE — Telephone Encounter (Signed)
Both adderall rx's filled for this month and next

## 2022-12-05 ENCOUNTER — Encounter: Payer: Self-pay | Admitting: Psychology

## 2022-12-05 ENCOUNTER — Ambulatory Visit (INDEPENDENT_AMBULATORY_CARE_PROVIDER_SITE_OTHER): Payer: 59

## 2022-12-05 DIAGNOSIS — I429 Cardiomyopathy, unspecified: Secondary | ICD-10-CM
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
## 2022-12-07 LAB — CUP PACEART REMOTE DEVICE CHECK
Date Time Interrogation Session: 20241129230537
Implantable Pulse Generator Implant Date: 20230421

## 2022-12-29 NOTE — Progress Notes (Signed)
[facility] Summary Report / Loop Recorder 

## 2022-12-29 NOTE — Addendum Note (Signed)
Addended by: Elease Etienne A on: 12/29/2022 09:35 AM   Modules accepted: Orders

## 2023-01-12 ENCOUNTER — Ambulatory Visit: Payer: 59

## 2023-01-12 DIAGNOSIS — Z8673 Personal history of transient ischemic attack (TIA), and cerebral infarction without residual deficits: Secondary | ICD-10-CM

## 2023-01-12 LAB — CUP PACEART REMOTE DEVICE CHECK
Date Time Interrogation Session: 20250103230506
Implantable Pulse Generator Implant Date: 20230421

## 2023-01-15 ENCOUNTER — Emergency Department (HOSPITAL_BASED_OUTPATIENT_CLINIC_OR_DEPARTMENT_OTHER): Payer: 59

## 2023-01-15 ENCOUNTER — Other Ambulatory Visit: Payer: Self-pay

## 2023-01-15 ENCOUNTER — Emergency Department (HOSPITAL_BASED_OUTPATIENT_CLINIC_OR_DEPARTMENT_OTHER)
Admission: EM | Admit: 2023-01-15 | Discharge: 2023-01-15 | Disposition: A | Discharge status: Home / Self Care | Payer: 59 | Attending: Emergency Medicine | Admitting: Emergency Medicine

## 2023-01-15 DIAGNOSIS — Z79899 Other long term (current) drug therapy: Secondary | ICD-10-CM | POA: Diagnosis not present

## 2023-01-15 DIAGNOSIS — I130 Hypertensive heart and chronic kidney disease with heart failure and stage 1 through stage 4 chronic kidney disease, or unspecified chronic kidney disease: Secondary | ICD-10-CM | POA: Insufficient documentation

## 2023-01-15 DIAGNOSIS — N1832 Chronic kidney disease, stage 3b: Secondary | ICD-10-CM | POA: Insufficient documentation

## 2023-01-15 DIAGNOSIS — I5020 Unspecified systolic (congestive) heart failure: Secondary | ICD-10-CM | POA: Diagnosis not present

## 2023-01-15 DIAGNOSIS — R42 Dizziness and giddiness: Secondary | ICD-10-CM | POA: Insufficient documentation

## 2023-01-15 DIAGNOSIS — R110 Nausea: Secondary | ICD-10-CM | POA: Diagnosis not present

## 2023-01-15 DIAGNOSIS — R930 Abnormal findings on diagnostic imaging of skull and head, not elsewhere classified: Secondary | ICD-10-CM | POA: Diagnosis not present

## 2023-01-15 DIAGNOSIS — Z7982 Long term (current) use of aspirin: Secondary | ICD-10-CM | POA: Diagnosis not present

## 2023-01-15 DIAGNOSIS — Z87891 Personal history of nicotine dependence: Secondary | ICD-10-CM | POA: Insufficient documentation

## 2023-01-15 LAB — CBC
HCT: 49.1 % (ref 39.0–52.0)
Hemoglobin: 16.3 g/dL (ref 13.0–17.0)
MCH: 31.5 pg (ref 26.0–34.0)
MCHC: 33.2 g/dL (ref 30.0–36.0)
MCV: 95 fL (ref 80.0–100.0)
Platelets: 305 10*3/uL (ref 150–400)
RBC: 5.17 MIL/uL (ref 4.22–5.81)
RDW: 15.3 % (ref 11.5–15.5)
WBC: 5.6 10*3/uL (ref 4.0–10.5)
nRBC: 0 % (ref 0.0–0.2)

## 2023-01-15 LAB — URINALYSIS, ROUTINE W REFLEX MICROSCOPIC
Bilirubin Urine: NEGATIVE
Glucose, UA: NEGATIVE mg/dL
Hgb urine dipstick: NEGATIVE
Ketones, ur: NEGATIVE mg/dL
Leukocytes,Ua: NEGATIVE
Nitrite: NEGATIVE
Specific Gravity, Urine: 1.014 (ref 1.005–1.030)
pH: 6.5 (ref 5.0–8.0)

## 2023-01-15 LAB — COMPREHENSIVE METABOLIC PANEL
ALT: 16 U/L (ref 0–44)
AST: 24 U/L (ref 15–41)
Albumin: 4.2 g/dL (ref 3.5–5.0)
Alkaline Phosphatase: 87 U/L (ref 38–126)
Anion gap: 8 (ref 5–15)
BUN: 23 mg/dL (ref 8–23)
CO2: 26 mmol/L (ref 22–32)
Calcium: 9.4 mg/dL (ref 8.9–10.3)
Chloride: 109 mmol/L (ref 98–111)
Creatinine, Ser: 1.55 mg/dL — ABNORMAL HIGH (ref 0.61–1.24)
GFR, Estimated: 46 mL/min — ABNORMAL LOW (ref >60)
Glucose, Bld: 86 mg/dL (ref 70–99)
Potassium: 4.2 mmol/L (ref 3.5–5.1)
Sodium: 143 mmol/L (ref 135–145)
Total Bilirubin: 0.5 mg/dL (ref 0.0–1.2)
Total Protein: 7.7 g/dL (ref 6.5–8.1)

## 2023-01-15 LAB — LIPASE, BLOOD: Lipase: 20 U/L (ref 11–51)

## 2023-01-15 MED ORDER — ONDANSETRON HCL 4 MG PO TABS
4.0000 mg | ORAL_TABLET | Freq: Four times a day (QID) | ORAL | 0 refills | Status: AC
Start: 2023-01-15 — End: ?

## 2023-01-15 MED ORDER — MECLIZINE HCL 25 MG PO TABS
25.0000 mg | ORAL_TABLET | Freq: Three times a day (TID) | ORAL | 0 refills | Status: AC | PRN
Start: 2023-01-15 — End: ?

## 2023-01-15 MED ORDER — ONDANSETRON HCL 4 MG/2ML IJ SOLN
4.0000 mg | Freq: Once | INTRAMUSCULAR | Status: AC
  Administered 2023-01-15: 4 mg via INTRAVENOUS
  Filled 2023-01-15: qty 2

## 2023-01-15 MED ORDER — MECLIZINE HCL 25 MG PO TABS
25.0000 mg | ORAL_TABLET | Freq: Once | ORAL | Status: AC
  Administered 2023-01-15: 25 mg via ORAL
  Filled 2023-01-15: qty 1

## 2023-01-15 NOTE — Discharge Instructions (Signed)
 Your MRI that was done today was normal.  Your blood work was also normal.  This is likely your vertigo.  You can continue taking meclizine at home which should help with your symptoms.

## 2023-01-15 NOTE — ED Notes (Signed)
 Discharge paperwork given and verbally understood.

## 2023-01-15 NOTE — ED Triage Notes (Signed)
 Per wife in triage, pt has been nauseous and vomiting since waking around 11am this morning.   Pt has h/o decreased ejection fraction, HTN and previous stroke, which leaves him chronically dizzy, and dehydration - wife states pt seems a bit more off balance today as well.  Only medication pt has had yet today was 10mg  hydrolazine for HTN this am.  Pt alert, NAD in triage.
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
## 2023-01-21 ENCOUNTER — Encounter
Payer: No Typology Code available for payment source | Attending: Physical Medicine & Rehabilitation | Admitting: Physical Medicine & Rehabilitation

## 2023-01-21 ENCOUNTER — Encounter: Payer: Self-pay | Admitting: Physical Medicine & Rehabilitation

## 2023-01-21 VITALS — BP 173/88 | HR 84 | Ht 70.0 in

## 2023-01-21 DIAGNOSIS — G3189 Other specified degenerative diseases of nervous system: Secondary | ICD-10-CM | POA: Insufficient documentation

## 2023-01-21 DIAGNOSIS — F900 Attention-deficit hyperactivity disorder, predominantly inattentive type: Secondary | ICD-10-CM | POA: Diagnosis not present

## 2023-01-21 DIAGNOSIS — S069XAS Unspecified intracranial injury with loss of consciousness status unknown, sequela: Secondary | ICD-10-CM | POA: Insufficient documentation

## 2023-01-21 DIAGNOSIS — G44329 Chronic post-traumatic headache, not intractable: Secondary | ICD-10-CM | POA: Insufficient documentation

## 2023-01-21 DIAGNOSIS — F09 Unspecified mental disorder due to known physiological condition: Secondary | ICD-10-CM | POA: Diagnosis present

## 2023-01-21 NOTE — Progress Notes (Signed)
 Subjective:    Patient ID: Gary [name], male    DOB: 05/29/1945, 78 y.o.   MRN: [id number]  HPI  Gary [name] is here in follow up his traumatic brain injury. He was in the ED last week with vertigo which was severe. He was given meclizine  which he's taken 2-3 x's. He has been dealing with some congestion but states that too out of the ordinary. The meclizine  helped and symptoms are largely resolved. He sometimes feels light-headed in the morning when he wakes up which is different from the above sx  I asked him how his mood was. He said "about the same.' He hasn't seen much of a difference with zoloft . He remains on adderall  for stimulation. His wife usually has to motivate him to get out of the house or for any type of physical activity. They have not received the gym membership requested yet but are hopeful with mediation happening soon, that he will have access to [facility]  He has had increased pain in his knees, right greater than left. The pain is above his knee cap. It's worse going up stairs. He has used voltaren  gel with some relief. He denies any popping or cracking. He does feel weaker.     Pain Inventory Average Pain 8 Pain Right Now 8 My pain is intermittent, aching, and knee pain NOT related to wkman comp per [place]  In the last 24 hours, has pain interfered with the following? General activity 5 Relation with others 5 Enjoyment of life 5 What TIME of day is your pain at its worst? varies Sleep (in general) Fair  Pain is worse with: walking, some activites, and walking up stairs in his house Pain improves with: rest Relief from Meds: 8  Family History  Problem Relation Age of Onset   Heart disease Mother    Hypertension Mother    Stroke Mother    Polycythemia Father    Polycythemia Brother    Leukemia Brother    Social History   Socioeconomic History   Marital status: Married    Spouse name: Marlow Guard   Number of children: Not on file   Years of  education: Not on file   Highest education level: Not on file  Occupational History   Occupation: Quarry manager    Comment: [employer]  Tobacco Use   Smoking status: Former    Types: Cigarettes   Smokeless tobacco: Never  Vaping Use   Vaping status: Never Used  Substance and Sexual Activity   Alcohol use: Not Currently   Drug use: Never   Sexual activity: Yes  Other Topics Concern   Not on file  Social History Narrative   ** Merged History Encounter **       Social Drivers of Health   Financial Resource Strain: Low Risk  (02/19/2022)   Received from [employer], [facility]   Overall Financial Resource Strain (CARDIA)    Difficulty of Paying Living Expenses: Not very hard  Food Insecurity: No Food Insecurity (02/19/2022)   Received from [facility], [facility]   Hunger Vital Sign    Worried About Running Out of Food in the Last Year: Never true    Ran Out of Food in the Last Year: Never true  Transportation Needs: No Transportation Needs (02/19/2022)   Received from [facility], [facility]   PRAPARE - Transportation    Lack of Transportation (Medical): No    Lack of Transportation (Non-Medical): No  Physical Activity: Unknown (02/19/2022)  Received from [facility], [facility]   Exercise Vital Sign    Days of Exercise per Week: 0 days    Minutes of Exercise per Session: Not on file  Stress: Stress Concern Present (02/19/2022)   Received from [facility], [facility] - Occupational Stress Questionnaire    Feeling of Stress : To some extent  Social Connections: Somewhat Isolated (02/19/2022)   Received from [facility], [facility]   Social Network    How would you rate your social network (family, work, friends)?: Restricted participation with some degree of social isolation   Past Surgical History:  Procedure Laterality Date   LOOP RECORDER INSERTION N/A 04/26/2021   Procedure: LOOP RECORDER  INSERTION;  Surgeon: Luana Rumple, MD;  Location: [facility] IN[facility]ASIVE [facility];  Service: Cardiovascular;  Laterality: N/A;   MINOR REMOVAL OF MANDIBULAR HARDWARE N/A 12/15/2019   Procedure: REMOVAL OF RIGHT LATERAL ORBITAL MINIPLATE;  Surgeon: Thornell Flirt, DO;  Location: [facility] OR;  Service: Plastics;  Laterality: N/A;   ORIF MANDIBULAR FRACTURE Bilateral 07/27/2019   Procedure: OPEN REDUCTION INTERNAL FIXATION (ORIF) OF COMPLEX ZYGOMATIC FRACTURE;  Surgeon: Thornell Flirt, DO;  Location: [facility] OR;  Service: Plastics;  Laterality: Bilateral;  2 hours, please   Past Surgical History:  Procedure Laterality Date   LOOP RECORDER INSERTION N/A 04/26/2021   Procedure: LOOP RECORDER INSERTION;  Surgeon: Luana Rumple, MD;  Location: [facility];  Service: Cardiovascular;  Laterality: N/A;   MINOR REMOVAL OF MANDIBULAR HARDWARE N/A 12/15/2019   Procedure: REMOVAL OF RIGHT LATERAL ORBITAL MINIPLATE;  Surgeon: Thornell Flirt, DO;  Location: [facility] OR;  Service: Plastics;  Laterality: N/A;   ORIF MANDIBULAR FRACTURE Bilateral 07/27/2019   Procedure: OPEN REDUCTION INTERNAL FIXATION (ORIF) OF COMPLEX ZYGOMATIC FRACTURE;  Surgeon: Thornell Flirt, DO;  Location: [facility] OR;  Service: Plastics;  Laterality: Bilateral;  2 hours, please   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   Trauma    BP (!) 173/88   Pulse 84   Ht 5\' 10"  (1.778 m)   SpO2 98%   BMI 23.23 kg/m   Opioid Risk Score:   Fall Risk Score:  `1  Depression screen [facility] 2/9     01/21/2023   11:43 AM 02/19/2022    1:28 PM 12/04/2021   10:55 AM 05/29/2021   11:07 AM 01/30/2021   11:37 AM  10/26/2020    9:44 AM 10/03/2020   11:40 AM  Depression screen PHQ 2/9  Decreased Interest 1 1 1  0  1 2  Down, Depressed, Hopeless 1 1 1  0 1 1 2   PHQ - 2 Score 2 2 2  0 1 2 4   Altered sleeping      0   Tired, decreased energy      3   Change in appetite      0   Feeling bad or failure about yourself       0   Trouble concentrating      3   Moving slowly  or fidgety/restless      2   Suicidal thoughts      0   PHQ-9 Score      10   Difficult doing work/chores      Somewhat difficult      Review of Systems  Musculoskeletal:  Positive for gait problem.  Skin:  Positive for color change.       Reports his hands are getting worse. Fingers on both hands were blue and cold  Neurological:  Positive for dizziness.       Went to the ED last week with vertigo  Hematological: Negative.   Psychiatric/Behavioral: Negative.    All other systems reviewed and are negative.      Objective:   Physical Exam General: No acute distress HEENT: NCAT, EOMI, oral membranes moist Cards: reg rate  Chest: normal effort Abdomen: Soft, NT, ND Skin: dry, intact Extremities: no edema Psych: flat but cooperative Skin: hands sl cool Neuro: Patient is alert and oriented to month but not the date or year.   He is able to follow basic commands.  Slow to process. Stm deficits. Often looks to his wife to finish answers for him. Wife corrects him often. Cranial nerve exam was somewhat nonfocal.  Strength is grossly 5 out of 5.     Gait wide based but steady.  No focal limb ataxia was seen.  No gross sensory abnormalities. Uses cane for balance. Tends to lean forward without cane. Romberg test +/-. Musculoskeletal: minimal crepitus at knees. Full knee rom. Some pain along quad tendon with resisted extension. Minimal pain with palpation of quad tendon or joint lines. Aaron Aas            PLAN: 1. 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 Mood issues as well.   -have recommended a gym membership for physical, psycho-social, sleep-wake cycle improvement, and cognitive stimulation 2.  Antithrombotics: On ASA alone 3. Pain in toes/feet/fingers/raynaud like appearance as well as low back pain:  -?some improvement with amlodipine ---continue 5mg  -keep hands and feet warms, covered when possible.     -tylenol  prn -want him going to gym.  4. Depression:               -on zoloft  100mg  daily.              -keep Adderall  XR to 30 mg daily mg daily.   -Continue Adderall  immediate release 20 mg daily in the afternoon             -continue [facility]              -continue to exercise and socialize as possible.   5. 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 Insomnia             -sometimes sleeps "in" which affects his bed time. Needs a more regular AM schedule             -voiding before bed, PM "fluid rationing" 18. Mild thrombocytosis:             -Recent platelet count 353k!     20 minutes of face to face patient care time were spent during this visit. All questions were encouraged and answered.  Case was discussed with WC case Production designer, theatre/television/film.  Follow up with me in 4 mos.

## 2023-01-21 NOTE — Patient Instructions (Signed)
 ALWAYS FEEL FREE TO CALL OUR OFFICE WITH ANY PROBLEMS OR QUESTIONS [phone])  **PLEASE NOTE** ALL MEDICATION REFILL REQUESTS (INCLUDING CONTROLLED SUBSTANCES) NEED TO BE MADE AT LEAST 7 DAYS PRIOR TO REFILL BEING DUE. ANY REFILL REQUESTS INSIDE THAT TIME FRAME MAY RESULT IN DELAYS IN RECEIVING YOUR PRESCRIPTION.     !!!!!HAPPY NEW YEAR!!!!!

## 2023-02-16 ENCOUNTER — Ambulatory Visit (INDEPENDENT_AMBULATORY_CARE_PROVIDER_SITE_OTHER): Payer: 59

## 2023-02-16 DIAGNOSIS — Z8673 Personal history of transient ischemic attack (TIA), and cerebral infarction without residual deficits: Secondary | ICD-10-CM | POA: Diagnosis not present

## 2023-02-16 LAB — CUP PACEART REMOTE DEVICE CHECK
Date Time Interrogation Session: 20250207230612
Implantable Pulse Generator Implant Date: 20230421

## 2023-02-19 ENCOUNTER — Encounter: Payer: Self-pay | Admitting: Cardiovascular Disease

## 2023-02-20 NOTE — Progress Notes (Signed)
[facility] Summary Report / Loop Recorder

## 2023-02-26 ENCOUNTER — Encounter: Payer: Worker's Compensation | Admitting: Psychology

## 2023-02-26 ENCOUNTER — Encounter: Payer: Self-pay | Admitting: Physical Medicine & Rehabilitation

## 2023-02-26 DIAGNOSIS — F900 Attention-deficit hyperactivity disorder, predominantly inattentive type: Secondary | ICD-10-CM

## 2023-02-26 DIAGNOSIS — Z87820 Personal history of traumatic brain injury: Secondary | ICD-10-CM

## 2023-02-26 MED ORDER — AMPHETAMINE-DEXTROAMPHET ER 30 MG PO CP24: 30.0000 mg | ORAL_CAPSULE | Freq: Every day | ORAL | 0 refills | Status: DC

## 2023-02-26 MED ORDER — AMPHETAMINE-DEXTROAMPHETAMINE 20 MG PO TABS: 20.0000 mg | ORAL_TABLET | Freq: Every day | ORAL | 0 refills | Status: DC

## 2023-02-26 NOTE — Telephone Encounter (Signed)
Adderall rx's RF'ed for Feb and Mar

## 2023-03-06 ENCOUNTER — Inpatient Hospital Stay: Payer: 59

## 2023-03-06 ENCOUNTER — Telehealth: Payer: Self-pay | Admitting: Hematology and Oncology

## 2023-03-06 ENCOUNTER — Other Ambulatory Visit: Payer: Self-pay | Admitting: Hematology and Oncology

## 2023-03-06 ENCOUNTER — Inpatient Hospital Stay: Payer: 59 | Admitting: Hematology and Oncology

## 2023-03-06 DIAGNOSIS — D473 Essential (hemorrhagic) thrombocythemia: Secondary | ICD-10-CM

## 2023-03-06 NOTE — Progress Notes (Signed)
 Rescheduled

## 2023-03-07 ENCOUNTER — Encounter: Payer: Self-pay | Admitting: Physical Medicine & Rehabilitation

## 2023-03-09 MED ORDER — ROSUVASTATIN CALCIUM 20 MG PO TABS: 20.0000 mg | ORAL_TABLET | Freq: Every day | ORAL | 6 refills | Status: AC

## 2023-03-09 MED ORDER — CARVEDILOL 3.125 MG PO TABS: 3.1250 mg | ORAL_TABLET | Freq: Two times a day (BID) | ORAL | 6 refills | Status: AC

## 2023-03-09 NOTE — Addendum Note (Signed)
 Addended by: Doreene Eland on: 03/09/2023 09:36 AM   Modules accepted: Orders

## 2023-03-23 ENCOUNTER — Ambulatory Visit (INDEPENDENT_AMBULATORY_CARE_PROVIDER_SITE_OTHER): Payer: 59

## 2023-03-23 DIAGNOSIS — I5189 Other ill-defined heart diseases: Secondary | ICD-10-CM | POA: Diagnosis not present

## 2023-03-24 LAB — CUP PACEART REMOTE DEVICE CHECK
Date Time Interrogation Session: 20250316231046
Implantable Pulse Generator Implant Date: 20230421

## 2023-03-25 NOTE — Progress Notes (Signed)
 [facility] Summary Report / Loop Recorder

## 2023-03-26 ENCOUNTER — Inpatient Hospital Stay: Payer: 59

## 2023-03-26 ENCOUNTER — Telehealth: Payer: Self-pay | Admitting: Hematology and Oncology

## 2023-03-26 ENCOUNTER — Inpatient Hospital Stay: Payer: 59 | Admitting: Hematology and Oncology

## 2023-03-26 NOTE — Progress Notes (Signed)
 Transportation issues. To be rescheduled

## 2023-03-30 ENCOUNTER — Encounter: Payer: Self-pay | Admitting: Cardiovascular Disease

## 2023-04-09 ENCOUNTER — Encounter (HOSPITAL_BASED_OUTPATIENT_CLINIC_OR_DEPARTMENT_OTHER): Payer: Self-pay | Admitting: Emergency Medicine

## 2023-04-09 ENCOUNTER — Emergency Department (HOSPITAL_BASED_OUTPATIENT_CLINIC_OR_DEPARTMENT_OTHER): Admitting: Radiology

## 2023-04-09 ENCOUNTER — Other Ambulatory Visit: Payer: Self-pay

## 2023-04-09 ENCOUNTER — Emergency Department (HOSPITAL_BASED_OUTPATIENT_CLINIC_OR_DEPARTMENT_OTHER)
Admission: EM | Admit: 2023-04-09 | Discharge: 2023-04-09 | Disposition: A | Discharge status: Home / Self Care | Attending: Emergency Medicine | Admitting: Emergency Medicine

## 2023-04-09 ENCOUNTER — Emergency Department (HOSPITAL_BASED_OUTPATIENT_CLINIC_OR_DEPARTMENT_OTHER)

## 2023-04-09 DIAGNOSIS — Z8673 Personal history of transient ischemic attack (TIA), and cerebral infarction without residual deficits: Secondary | ICD-10-CM | POA: Insufficient documentation

## 2023-04-09 DIAGNOSIS — Z7982 Long term (current) use of aspirin: Secondary | ICD-10-CM | POA: Insufficient documentation

## 2023-04-09 DIAGNOSIS — I824Y1 Acute embolism and thrombosis of unspecified deep veins of right proximal lower extremity: Secondary | ICD-10-CM | POA: Diagnosis not present

## 2023-04-09 DIAGNOSIS — N189 Chronic kidney disease, unspecified: Secondary | ICD-10-CM | POA: Insufficient documentation

## 2023-04-09 DIAGNOSIS — M79661 Pain in right lower leg: Secondary | ICD-10-CM | POA: Diagnosis present

## 2023-04-09 MED ORDER — APIXABAN 2.5 MG PO TABS
10.0000 mg | ORAL_TABLET | Freq: Once | ORAL | Status: AC
  Administered 2023-04-09: 10 mg via ORAL
  Filled 2023-04-09: qty 4

## 2023-04-09 MED ORDER — OXYCODONE HCL 5 MG PO TABS
5.0000 mg | ORAL_TABLET | Freq: Four times a day (QID) | ORAL | 0 refills | Status: AC | PRN
  Filled 2023-04-09: qty 10, 3d supply, fill #0

## 2023-04-09 MED ORDER — APIXABAN (ELIQUIS) VTE STARTER PACK (10MG AND 5MG)
ORAL_TABLET | ORAL | 0 refills | Status: DC
  Filled 2023-04-09: qty 74, 30d supply, fill #0

## 2023-04-09 NOTE — ED Provider Notes (Signed)
 [place] EMERGENCY DEPARTMENT AT [facility] Provider Note   CSN: [id number] Arrival date & time: 04/09/23  1601     History  Chief Complaint  Patient presents with   Knee Pain    Gary [name] is a 78 y.o. male.  Patient here with right lower leg pain for last few days.  History of blood clot.  History of reflux diverticulitis TBI stroke CKD low platelets.  Denies any trauma.  Denies any fever or chills.  No shortness of breath no chest pain no weakness numbness tingling.  Concern for blood clot.  The history is provided by the patient.       Home Medications Prior to Admission medications   Medication Sig Start Date End Date Taking? Authorizing Provider  APIXABAN (ELIQUIS) VTE STARTER PACK (10MG  AND 5MG ) Take as directed on package: start with two-5mg  tablets twice daily for 7 days. On day 8, switch to one-5mg  tablet twice daily. 04/09/23  Yes Sohana Austell, DO  oxyCODONE (ROXICODONE) 5 MG immediate release tablet Take 1 tablet (5 mg total) by mouth every 6 (six) hours as needed for up to 10 doses. 04/09/23  Yes Tiaria Biby, DO  acetaminophen (TYLENOL) 325 MG tablet Take 1-2 tablets (325-650 mg total) by mouth every 4 (four) hours as needed for mild pain. 05/03/21   Setzer, Lynnell Jude, PA-C  amLODipine (NORVASC) 5 MG tablet TAKE 1 TABLET(5 MG) BY MOUTH DAILY 07/18/22   Ranelle Oyster, MD  amphetamine-dextroamphetamine (ADDERALL XR) 30 MG 24 hr capsule Take 1 capsule (30 mg total) by mouth daily. 02/26/23   Ranelle Oyster, MD  amphetamine-dextroamphetamine (ADDERALL XR) 30 MG 24 hr capsule Take 1 capsule (30 mg total) by mouth daily. 02/26/23   Ranelle Oyster, MD  amphetamine-dextroamphetamine (ADDERALL) 20 MG tablet Take 1 tablet (20 mg total) by mouth daily. 02/26/23   Ranelle Oyster, MD  amphetamine-dextroamphetamine (ADDERALL) 20 MG tablet Take 1 tablet (20 mg total) by mouth daily. 02/26/23 02/26/24  Ranelle Oyster, MD  aspirin 81 MG EC tablet Take 1 tablet  (81 mg total) by mouth daily. Swallow whole. 05/03/21   Setzer, Lynnell Jude, PA-C  azelastine (ASTELIN) 0.1 % nasal spray Place 1 spray into both nostrils 2 (two) times daily. Use in each nostril as directed 02/22/21   Janeece Agee, NP  b complex vitamins capsule Take 1 capsule by mouth daily.    [provider]  Brivaracetam (BRIVIACT) 50 MG TABS Take 50 mg by mouth 2 (two) times daily. 08/27/22   Ihor Austin, NP  carvedilol (COREG) 3.125 MG tablet Take 1 tablet (3.125 mg total) by mouth 2 (two) times daily with a meal. 03/09/23   Ranelle Oyster, MD  cetirizine (ZYRTEC) 10 MG tablet Take 1 tablet (10 mg total) by mouth daily. 02/22/21   Janeece Agee, NP  ezetimibe (ZETIA) 10 MG tablet Take 1 tablet (10 mg total) by mouth daily. 08/19/22   Jodelle Red, MD  finasteride (PROSCAR) 5 MG tablet Take 1 tablet (5 mg total) by mouth daily. 03/21/21   Janeece Agee, NP  hydrALAZINE (APRESOLINE) 10 MG tablet Take 1 tablet (10 mg total) by mouth 3 (three) times daily. 08/25/22   Jodelle Red, MD  hydroxyurea (HYDREA) 500 MG capsule Take 1 tablet (500 mg) on Mondays, Wednesdays, Fridays and Weekends. Holding on Tuesdays and Thursdays.  May take with food to minimize GI side effects. 09/03/22   Briant Cedar, PA-C  isosorbide mononitrate (IMDUR) 30 MG 24 hr  tablet Take 1 tablet (30 mg total) by mouth daily. 06/19/22 06/14/23  Jodelle Red, MD  levothyroxine (SYNTHROID) 75 MCG tablet Take 1 tablet (75 mcg total) by mouth daily before breakfast. 10/22/22   Ranelle Oyster, MD  meclizine (ANTIVERT) 25 MG tablet Take 1 tablet (25 mg total) by mouth 3 (three) times daily as needed for dizziness. 01/15/23   Arletha Pili, DO  Multiple Vitamin (MULTIVITAMIN WITH MINERALS) TABS tablet Take 1 tablet by mouth daily.    [provider]  ondansetron (ZOFRAN) 4 MG tablet Take 1 tablet (4 mg total) by mouth every 6 (six) hours. 01/15/23   Anders Simmonds T, DO  pantoprazole  (PROTONIX) 40 MG tablet TAKE 1 TABLET(40 MG) BY MOUTH DAILY 06/05/21   Janeece Agee, NP  rosuvastatin (CRESTOR) 20 MG tablet Take 1 tablet (20 mg total) by mouth daily. 03/09/23   Ranelle Oyster, MD  sertraline (ZOLOFT) 100 MG tablet Take 1 tablet (100 mg total) by mouth daily. 10/22/22   Ranelle Oyster, MD      Allergies    Levofloxacin, Atomoxetine hcl, and Shellfish allergy    Review of Systems   Review of Systems  Physical Exam Updated Vital Signs BP (!) 166/75 (BP Location: Right Arm)   Pulse 96   Temp 97.6 F (36.4 C)   Resp 16   SpO2 100%  Physical Exam Vitals and nursing note reviewed.  Constitutional:      General: He is not in acute distress.    Appearance: He is well-developed.  HENT:     Head: Normocephalic and atraumatic.  Eyes:     Extraocular Movements: Extraocular movements intact.     Conjunctiva/sclera: Conjunctivae normal.     Pupils: Pupils are equal, round, and reactive to light.  Cardiovascular:     Rate and Rhythm: Normal rate and regular rhythm.     Pulses: Normal pulses.     Heart sounds: No murmur heard. Pulmonary:     Effort: Pulmonary effort is normal. No respiratory distress.     Breath sounds: Normal breath sounds.  Abdominal:     Palpations: Abdomen is soft.     Tenderness: There is no abdominal tenderness.  Musculoskeletal:        General: Tenderness present. No swelling. Normal range of motion.     Cervical back: Neck supple.     Comments: Tenderness to the right calf  Skin:    General: Skin is warm and dry.     Capillary Refill: Capillary refill takes less than 2 seconds.  Neurological:     Mental Status: He is alert.  Psychiatric:        Mood and Affect: Mood normal.     ED Results / Procedures / Treatments   Labs (all labs ordered are listed, but only abnormal results are displayed) Labs Reviewed - No data to display  EKG None  Radiology US Venous Img Lower Right (DVT Study) Result Date: 04/09/2023 CLINICAL DATA:   Right lower extremity pain. EXAM: Right LOWER EXTREMITY VENOUS DOPPLER ULTRASOUND TECHNIQUE: Gray-scale sonography with graded compression, as well as color Doppler and duplex ultrasound were performed to evaluate the lower extremity deep venous systems from the level of the common femoral vein and including the common femoral, femoral, profunda femoral, popliteal and calf veins including the posterior tibial, peroneal and gastrocnemius veins when visible. The superficial great saphenous vein was also interrogated. Spectral Doppler was utilized to evaluate flow at rest and with distal augmentation maneuvers  in the common femoral, femoral and popliteal veins. COMPARISON:  None Available. FINDINGS: Contralateral Common Femoral Vein: Respiratory phasicity is normal and symmetric with the symptomatic side. No evidence of thrombus. Normal compressibility. Common Femoral Vein: No evidence of thrombus. Normal compressibility, respiratory phasicity and response to augmentation. Saphenofemoral Junction: No evidence of thrombus. Normal compressibility and flow on color Doppler imaging. Profunda Femoral Vein: No evidence of thrombus. Normal compressibility and flow on color Doppler imaging. Femoral Vein: No evidence of thrombus. Normal compressibility, respiratory phasicity and response to augmentation. Popliteal Vein: There is large occlusive thrombus in the right popliteal vein. Calf Veins: Occlusive thrombus noted in the posterior tibial and peroneal veins. Superficial Great Saphenous Vein: No evidence of thrombus. Normal compressibility. Venous Reflux:  None. Other Findings:  None. IMPRESSION: Positive for occlusive DVT in the right lower extremity with proximal extension to the popliteal vein. These results were called by telephone at the time of interpretation on 04/09/2023 at 6:17 pm to provider Daveion Robar , who verbally acknowledged these results. Electronically Signed   By: Elgie Collard M.D.   On: 04/09/2023  18:32   DG Knee Complete 4 Views Right Result Date: 04/09/2023 CLINICAL DATA:  Right posterior knee pain EXAM: RIGHT KNEE - COMPLETE 4+ VIEW COMPARISON:  None available FINDINGS: Minimal spurring of the patellofemoral compartment. Mild thickening of the patellar tendon and prepatellar soft tissues. No fracture or dislocation. IMPRESSION: 1. No acute osseous abnormality of the right knee. 2. Mild thickening of the patellar tendon suspicious for tendinosis. Electronically Signed   By: Acquanetta Belling M.D.   On: 04/09/2023 16:52    Procedures Procedures    Medications Ordered in ED Medications  apixaban (ELIQUIS) tablet 10 mg (has no administration in time range)    ED Course/ Medical Decision Making/ A&P                                 Medical Decision Making Amount and/or Complexity of Data Reviewed Radiology: ordered.  Risk Prescription drug management.   Marisa Hufstetler is here with right calf pain.  History of DVT.  History of stroke TBI low platelets.  He is on aspirin.  There is no major leg swelling or redness.  DVT study x-ray obtained of the knee.  He is neurovascular neuromuscular intact on exam.  Torrential diagnosis likely popliteal Baker's cyst or DVT or arthritis process of the knee.  Overall radiology called and patient does have positive occlusive DVT in the right lower extremity in the calf vein and noted to be in the posterior tibial and peroneal veins as well as popliteal vein.  However no further proximal process otherwise.  Overall we will start patient on Eliquis.  Educated about blood thinners.  He has a history of blood clots is well aware of being on a blood thinner.  Will put him on starter pack.  Will write him on Roxicodone for breakthrough pain.  X-ray of the knee was unremarkable.  Overall he will follow-up with his primary care doctor.  Discharged in good condition.  No respiratory symptoms.  No concern for PE other acute process.  This chart was dictated  using voice recognition software.  Despite best efforts to proofread,  errors can occur which can change the documentation meaning.         Final Clinical Impression(s) / ED Diagnoses Final diagnoses:  Acute deep vein thrombosis (DVT) of proximal vein of right lower  extremity (HCC)    Rx / DC Orders ED Discharge Orders          Ordered    APIXABAN (ELIQUIS) VTE STARTER PACK (10MG  AND 5MG )        04/09/23 1906    oxyCODONE (ROXICODONE) 5 MG immediate release tablet  Every 6 hours PRN        04/09/23 1924              Virgina Norfolk, DO 04/09/23 1927

## 2023-04-09 NOTE — Discharge Instructions (Signed)
 Discontinue aspirin.  You have a blood clot in your right lower leg.  I have started you on a blood thinner called Eliquis.  As before when you are on blood thinner if you develop any spontaneous bleeding or trauma especially to your head you should  be evaluated.

## 2023-04-09 NOTE — ED Triage Notes (Signed)
 C/o posterior R knee pain. States painful to apply pressure. Hx of clots, not taking thinners. Legs are equal in temp, size, and color.

## 2023-04-10 ENCOUNTER — Other Ambulatory Visit (HOSPITAL_BASED_OUTPATIENT_CLINIC_OR_DEPARTMENT_OTHER): Payer: Self-pay

## 2023-04-15 ENCOUNTER — Telehealth: Payer: Self-pay | Admitting: Hematology

## 2023-04-15 NOTE — Telephone Encounter (Signed)
 Scheduled patient's appts. Advised patient to contact us if rescheduling is needed.

## 2023-04-17 ENCOUNTER — Telehealth: Payer: Self-pay | Admitting: Hematology and Oncology

## 2023-04-24 ENCOUNTER — Inpatient Hospital Stay (HOSPITAL_BASED_OUTPATIENT_CLINIC_OR_DEPARTMENT_OTHER): Admitting: Hematology and Oncology

## 2023-04-24 ENCOUNTER — Inpatient Hospital Stay: Attending: Hematology and Oncology

## 2023-04-24 VITALS — BP 150/71 | HR 63 | Temp 98.0°F | Resp 13 | Wt 158.4 lb

## 2023-04-24 DIAGNOSIS — Z7982 Long term (current) use of aspirin: Secondary | ICD-10-CM | POA: Diagnosis not present

## 2023-04-24 DIAGNOSIS — D473 Essential (hemorrhagic) thrombocythemia: Secondary | ICD-10-CM

## 2023-04-24 DIAGNOSIS — D75839 Thrombocytosis, unspecified: Secondary | ICD-10-CM

## 2023-04-24 DIAGNOSIS — Z1589 Genetic susceptibility to other disease: Secondary | ICD-10-CM | POA: Diagnosis not present

## 2023-04-24 DIAGNOSIS — Z87891 Personal history of nicotine dependence: Secondary | ICD-10-CM | POA: Insufficient documentation

## 2023-04-24 LAB — CMP (CANCER CENTER ONLY)
ALT: 18 U/L (ref 0–44)
AST: 26 U/L (ref 15–41)
Albumin: 3.9 g/dL (ref 3.5–5.0)
Alkaline Phosphatase: 82 U/L (ref 38–126)
Anion gap: 6 (ref 5–15)
BUN: 26 mg/dL — ABNORMAL HIGH (ref 8–23)
CO2: 30 mmol/L (ref 22–32)
Calcium: 9.5 mg/dL (ref 8.9–10.3)
Chloride: 107 mmol/L (ref 98–111)
Creatinine: 1.71 mg/dL — ABNORMAL HIGH (ref 0.61–1.24)
GFR, Estimated: 41 mL/min — ABNORMAL LOW (ref >60)
Glucose, Bld: 76 mg/dL (ref 70–99)
Potassium: 4.3 mmol/L (ref 3.5–5.1)
Sodium: 143 mmol/L (ref 135–145)
Total Bilirubin: 0.4 mg/dL (ref 0.0–1.2)
Total Protein: 7.3 g/dL (ref 6.5–8.1)

## 2023-04-24 LAB — CBC WITH DIFFERENTIAL (CANCER CENTER ONLY)
Abs Immature Granulocytes: 0.06 10*3/uL (ref 0.00–0.07)
Basophils Absolute: 0.1 10*3/uL (ref 0.0–0.1)
Basophils Relative: 1 %
Eosinophils Absolute: 0.2 10*3/uL (ref 0.0–0.5)
Eosinophils Relative: 3 %
HCT: 46 % (ref 39.0–52.0)
Hemoglobin: 15.1 g/dL (ref 13.0–17.0)
Immature Granulocytes: 1 %
Lymphocytes Relative: 11 %
Lymphs Abs: 0.7 10*3/uL (ref 0.7–4.0)
MCH: 29.6 pg (ref 26.0–34.0)
MCHC: 32.8 g/dL (ref 30.0–36.0)
MCV: 90.2 fL (ref 80.0–100.0)
Monocytes Absolute: 0.5 10*3/uL (ref 0.1–1.0)
Monocytes Relative: 8 %
Neutro Abs: 5.1 10*3/uL (ref 1.7–7.7)
Neutrophils Relative %: 76 %
Platelet Count: 448 10*3/uL — ABNORMAL HIGH (ref 150–400)
RBC: 5.1 MIL/uL (ref 4.22–5.81)
RDW: 14.1 % (ref 11.5–15.5)
WBC Count: 6.6 10*3/uL (ref 4.0–10.5)
nRBC: 0 % (ref 0.0–0.2)

## 2023-04-24 MED ORDER — APIXABAN 5 MG PO TABS: 5.0000 mg | ORAL_TABLET | Freq: Two times a day (BID) | ORAL | 1 refills | Status: AC

## 2023-04-24 MED ORDER — HYDROXYUREA 500 MG PO CAPS: ORAL_CAPSULE | ORAL | 1 refills | Status: DC

## 2023-04-24 NOTE — Progress Notes (Signed)
 [facility] [phone]   [phone]  PROGRESS NOTE  Patient Care Team: Gladystine Erminio CROME, MD as PCP - General (Family Medicine) Lonni Slain, MD as PCP - Cardiology (Cardiology) Gladis Elsie JAYSON DEVONNA (Family Medicine) Lonni Slain, MD as Consulting Physician (Cardiology)  Hematological/Oncological History # Essential Thrombocytosis, JAK2 Positive  05/31/2021: establish care with Dr. Federico for thrombocytosis. Found to be JAK2 positive.  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  The differential diagnosis includes essential thrombocythemia and early phase of primary myelofibrosis. 09/10/2021: start hydroxyurea  500 mg QOD. Was to start on 8/4 but patient was hesitant.  10/15/2021: WBC 7.7, Hgb 16.0, MCV 85.5, Plt 404 12/10/2021: WBC 6.1, Hgb 15.6, MCV 90.5, Plt 351 07/02/2022: WBC 6.8, Hgb 15.6, MCV 88.7, Plt 455K.  07/30/2022: WBC 5.8, Hgb 15.5, MCV 87.7, Plt 491K. Increased dose of Hydroxyurea  500 mg once a day on Mondays, Wednesday, Fridays and Weekends. Holding Tuesdays and Thursdays 09/03/2022: WBC 6.1, Hgb 14.6, MCV 87.9, Plt 312K  Interval History:  Gary [name] 78 y.o. male with medical history significant for essential thrombocytosis, JAK2 positive who presents for a follow up visit. The patient's last visit was on 03/26/2022. In the interim since the last visit he has continued hydroxyurea  therapy but unfortunately developed a right lower extremity DVT on 04/09/2023.  On exam today Gary [name] reports that he unfortunately did develop a blood clot in the interim since her last visit.  He was taking his aspirin  faithfully.  He is currently taking Eliquis  5 mg twice daily and tolerating it well.  He is not having any bleeding, bruising, or dark stools.  He reports that he was also told recently that his vitamin D  and B12 levels were low.  He is having issues with  nausea, vomiting, or diarrhea.  He is tolerating hydroxyurea  therapy well with no ulcers in the mouth or ankles.  He has been taking it faithfully every Monday, Wednesday, and Friday.  He has been skipping Thursdays and Tuesdays as well as the weekends.  Overall he is feeling some improvement from the blood clot in his leg and no other concerning symptoms at this time. He denies fevers, chills,sweats, shortness of breath, chest pain or cough. He has no other complaints. Full 10 point ROS is listed below.  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   Trauma     SURGICAL HISTORY: Past Surgical History:  Procedure Laterality Date   LOOP RECORDER INSERTION N/A 04/26/2021   Procedure: LOOP RECORDER INSERTION;  Surgeon: Francyne Headland, MD;  Location: [facility];  Service: Cardiovascular;  Laterality: N/A;   MINOR REMOVAL OF MANDIBULAR HARDWARE N/A 12/15/2019   Procedure: REMOVAL OF RIGHT LATERAL ORBITAL MINIPLATE;  Surgeon: Lowery Estefana RAMAN, DO;  Location: [facility] OR;  Service: Plastics;  Laterality: N/A;   ORIF MANDIBULAR FRACTURE Bilateral 07/27/2019   Procedure: OPEN REDUCTION INTERNAL FIXATION (ORIF) OF COMPLEX ZYGOMATIC FRACTURE;  Surgeon: Lowery Estefana RAMAN, DO;  Location: [facility] OR;  Service: Plastics;  Laterality: Bilateral;  2 hours, please    SOCIAL HISTORY: Social History   Socioeconomic History   Marital status: Married    Spouse name: [name]   Number of children: Not on file   Years of education: Not on file   Highest education level: Not on file  Occupational History   Occupation: Quarry manager     Comment: [employer]  Tobacco Use   Smoking status: Former    Types: Cigarettes   Smokeless tobacco: Never  Vaping Use   Vaping status: Never Used  Substance and Sexual Activity   Alcohol use: Not Currently   Drug use: Never   Sexual activity: Yes  Other Topics Concern   Not on file  Social History Narrative   ** Merged History Encounter **       Social Drivers of Health   Financial Resource Strain: Medium Risk (04/10/2023)   Received from [facility]   Overall Financial Resource Strain (CARDIA)    Difficulty of Paying Living Expenses: Somewhat hard  Food Insecurity: No Food Insecurity (04/10/2023)   Received from [facility]   Hunger Vital Sign    Worried About Running Out of Food in the Last Year: Never true    Ran Out of Food in the Last Year: Never true  Transportation Needs: No Transportation Needs (04/10/2023)   Received from [facility] - Transportation    Lack of Transportation (Medical): No    Lack of Transportation (Non-Medical): No  Physical Activity: Unknown (02/19/2022)   [facility]   Exercise Vital Sign    Days of Exercise per Week: 0 days    Minutes of Exercise per Session: Not on file  Stress: Stress Concern Present (02/19/2022)   Received from [facility], [facility] - Occupational Stress Questionnaire    Feeling of Stress : To some extent  Social Connections: Somewhat Isolated (02/19/2022)   [facility], [facility]   Social Network    How would you rate your social network (family, work, friends)?: Restricted participation with some degree of social isolation  Intimate Partner Violence: Not At Risk (02/19/2022)   [facility]   HITS    Over the last 12 months how often did your partner physically hurt you?: Never    Over the last 12 months how often did your partner insult you or talk down to you?: Never    Over  the last 12 months how often did your partner threaten you with physical harm?: Never    Over the last 12 months how often did your partner scream or curse at you?: Rarely    FAMILY HISTORY: Family History  Problem Relation Age of Onset   Heart disease Mother    Hypertension Mother    Stroke Mother    Polycythemia Father    Polycythemia Brother    Leukemia Brother     ALLERGIES:  is allergic to levofloxacin , atomoxetine hcl, and shellfish allergy.  MEDICATIONS:  Current Outpatient Medications  Medication Sig Dispense Refill   apixaban  (ELIQUIS ) 5 MG TABS tablet Take 1 tablet (5 mg total) by mouth 2 (two) times daily. 180 tablet 1   acetaminophen  (TYLENOL ) 325 MG tablet Take 1-2 tablets (325-650 mg total) by mouth every 4 (four) hours as needed for mild pain.     amLODipine  (NORVASC ) 5 MG tablet TAKE 1 TABLET(5 MG) BY MOUTH DAILY 30 tablet 6  amphetamine -dextroamphetamine  (ADDERALL  XR) 30 MG 24 hr capsule Take 1 capsule (30 mg total) by mouth daily. 30 capsule 0   amphetamine -dextroamphetamine  (ADDERALL  XR) 30 MG 24 hr capsule Take 1 capsule (30 mg total) by mouth daily. 30 capsule 0   amphetamine -dextroamphetamine  (ADDERALL ) 20 MG tablet Take 1 tablet (20 mg total) by mouth daily. 30 tablet 0   amphetamine -dextroamphetamine  (ADDERALL ) 20 MG tablet Take 1 tablet (20 mg total) by mouth daily. 30 tablet 0   aspirin  81 MG EC tablet Take 1 tablet (81 mg total) by mouth daily. Swallow whole. 30 tablet 0   azelastine  (ASTELIN ) 0.1 % nasal spray Place 1 spray into both nostrils 2 (two) times daily. Use in each nostril as directed 30 mL 12   b complex vitamins capsule Take 1 capsule by mouth daily.     Brivaracetam  (BRIVIACT ) 50 MG TABS Take 50 mg by mouth 2 (two) times daily. 60 tablet 5   carvedilol  (COREG ) 3.125 MG tablet Take 1 tablet (3.125 mg total) by mouth 2 (two) times daily with a meal. 60 tablet 6   cetirizine  (ZYRTEC ) 10 MG tablet Take 1 tablet (10 mg total) by mouth daily. 30  tablet 11   ezetimibe  (ZETIA ) 10 MG tablet Take 1 tablet (10 mg total) by mouth daily. 90 tablet 1   finasteride  (PROSCAR ) 5 MG tablet Take 1 tablet (5 mg total) by mouth daily. 30 tablet 0   hydrALAZINE  (APRESOLINE ) 10 MG tablet Take 1 tablet (10 mg total) by mouth 3 (three) times daily. 270 tablet 3   hydroxyurea  (HYDREA ) 500 MG capsule Take 1 tablet (500 mg) on Mondays, Wednesdays, Fridays and Weekends. Holding on Tuesdays and Thursdays.  May take with food to minimize GI side effects. 90 capsule 1   isosorbide  mononitrate (IMDUR ) 30 MG 24 hr tablet Take 1 tablet (30 mg total) by mouth daily. 90 tablet 3   levothyroxine  (SYNTHROID ) 75 MCG tablet Take 1 tablet (75 mcg total) by mouth daily before breakfast. 30 tablet 4   meclizine  (ANTIVERT ) 25 MG tablet Take 1 tablet (25 mg total) by mouth 3 (three) times daily as needed for dizziness. 30 tablet 0   Multiple Vitamin (MULTIVITAMIN WITH MINERALS) TABS tablet Take 1 tablet by mouth daily.     ondansetron  (ZOFRAN ) 4 MG tablet Take 1 tablet (4 mg total) by mouth every 6 (six) hours. 12 tablet 0   oxyCODONE  (ROXICODONE ) 5 MG immediate release tablet Take 1 tablet (5 mg total) by mouth every 6 (six) hours as needed for up to 10 doses. 10 tablet 0   pantoprazole  (PROTONIX ) 40 MG tablet TAKE 1 TABLET(40 MG) BY MOUTH DAILY 90 tablet 3   rosuvastatin  (CRESTOR ) 20 MG tablet Take 1 tablet (20 mg total) by mouth daily. 30 tablet 6   sertraline  (ZOLOFT ) 100 MG tablet Take 1 tablet (100 mg total) by mouth daily. 30 tablet 5   No current facility-administered medications for this visit.    REVIEW OF SYSTEMS:   Constitutional: ( - ) fevers, ( - )  chills , ( - ) night sweats Eyes: ( - ) blurriness of vision, ( - ) double vision, ( - ) watery eyes Ears, nose, mouth, throat, and face: ( - ) mucositis, ( - ) sore throat Respiratory: ( - ) cough, ( - ) dyspnea, ( - ) wheezes Cardiovascular: ( - ) palpitation, ( - ) chest discomfort, ( - ) lower extremity  swelling Gastrointestinal:  ( - ) nausea, ( - )  heartburn, ( - ) change in bowel habits Skin: ( - ) abnormal skin rashes Lymphatics: ( - ) new lymphadenopathy, ( - ) easy bruising Neurological: ( - ) numbness, ( - ) tingling, ( - ) new weaknesses Behavioral/Psych: ( - ) mood change, ( - ) new changes  All other systems were reviewed with the patient and are negative.  PHYSICAL EXAMINATION:  Vitals:   04/24/23 1152  BP: (!) 150/71  Pulse: 63  Resp: 13  Temp: 98 F (36.7 C)  SpO2: 91%      Filed Weights   04/24/23 1152  Weight: 158 lb 6.4 oz (71.8 kg)       GENERAL: Well-appearing elderly Caucasian male, alert, no distress and comfortable SKIN: skin color, texture, turgor are normal, no rashes or significant lesions EYES: conjunctiva are pink and non-injected, sclera clear LUNGS: clear to auscultation and percussion with normal breathing effort HEART: regular rate & rhythm and no murmurs and no lower extremity edema Musculoskeletal: no cyanosis of digits and no clubbing  PSYCH: alert & oriented x 3, fluent speech NEURO: no focal motor/sensory deficits  LABORATORY DATA:  I have reviewed the data as listed    Latest Ref Rng & Units 04/24/2023   11:32 AM 01/15/2023    2:46 PM 12/03/2022    2:13 PM  CBC  WBC 4.0 - 10.5 K/uL 6.6  5.6  6.0   Hemoglobin 13.0 - 17.0 g/dL 84.8  83.6  84.1   Hematocrit 39.0 - 52.0 % 46.0  49.1  47.8   Platelets 150 - 400 K/uL 448  305  364        Latest Ref Rng & Units 04/24/2023   11:32 AM 01/15/2023    2:46 PM 12/03/2022    2:13 PM  CMP  Glucose 70 - 99 mg/dL 76  86  83   BUN 8 - 23 mg/dL 26  23  25    Creatinine 0.61 - 1.24 mg/dL 8.28  8.44  8.13   Sodium 135 - 145 mmol/L 143  143  144   Potassium 3.5 - 5.1 mmol/L 4.3  4.2  4.4   Chloride 98 - 111 mmol/L 107  109  111   CO2 22 - 32 mmol/L 30  26  28    Calcium  8.9 - 10.3 mg/dL 9.5  9.4  9.3   Total Protein 6.5 - 8.1 g/dL 7.3  7.7  7.1   Total Bilirubin 0.0 - 1.2 mg/dL 0.4  0.5  0.4    Alkaline Phos 38 - 126 U/L 82  87  92   AST 15 - 41 U/L 26  24  21    ALT 0 - 44 U/L 18  16  13      RADIOGRAPHIC STUDIES: US  Venous Img Lower Right (DVT Study) Result Date: 04/09/2023 CLINICAL DATA:  Right lower extremity pain. EXAM: Right LOWER EXTREMITY VENOUS DOPPLER ULTRASOUND TECHNIQUE: Gray-scale sonography with graded compression, as well as color Doppler and duplex ultrasound were performed to evaluate the lower extremity deep venous systems from the level of the common femoral vein and including the common femoral, femoral, profunda femoral, popliteal and calf veins including the posterior tibial, peroneal and gastrocnemius veins when visible. The superficial great saphenous vein was also interrogated. Spectral Doppler was utilized to evaluate flow at rest and with distal augmentation maneuvers in the common femoral, femoral and popliteal veins. COMPARISON:  None Available. FINDINGS: Contralateral Common Femoral Vein: Respiratory phasicity is normal and symmetric with the symptomatic side. No  evidence of thrombus. Normal compressibility. Common Femoral Vein: No evidence of thrombus. Normal compressibility, respiratory phasicity and response to augmentation. Saphenofemoral Junction: No evidence of thrombus. Normal compressibility and flow on color Doppler imaging. Profunda Femoral Vein: No evidence of thrombus. Normal compressibility and flow on color Doppler imaging. Femoral Vein: No evidence of thrombus. Normal compressibility, respiratory phasicity and response to augmentation. Popliteal Vein: There is large occlusive thrombus in the right popliteal vein. Calf Veins: Occlusive thrombus noted in the posterior tibial and peroneal veins. Superficial Great Saphenous Vein: No evidence of thrombus. Normal compressibility. Venous Reflux:  None. Other Findings:  None. IMPRESSION: Positive for occlusive DVT in the right lower extremity with proximal extension to the popliteal vein. These results were called  by telephone at the time of interpretation on 04/09/2023 at 6:17 pm to provider ADAM CURATOLO , who verbally acknowledged these results. Electronically Signed   By: Vanetta Chou M.D.   On: 04/09/2023 18:32   DG Knee Complete 4 Views Right Result Date: 04/09/2023 CLINICAL DATA:  Right posterior knee pain EXAM: RIGHT KNEE - COMPLETE 4+ VIEW COMPARISON:  None available FINDINGS: Minimal spurring of the patellofemoral compartment. Mild thickening of the patellar tendon and prepatellar soft tissues. No fracture or dislocation. IMPRESSION: 1. No acute osseous abnormality of the right knee. 2. Mild thickening of the patellar tendon suspicious for tendinosis. Electronically Signed   By: Aliene Lloyd M.D.   On: 04/09/2023 16:52    ASSESSMENT & PLAN Gary [name] is a 78 y.o. male with medical history significant for essential thrombocytosis, JAK2 positive who presents for a follow up visit.   # Essential Thrombocytosis, JAK2 Positive  --Diagnosis confirmed by bone marrow biopsy on 08/05/2021. --target Plt count <400.  Patient currently above target at 448. First time he has been above target in almost a year  --currently on Hydroxyurea  500 mg once a day on Mondays, Wednesday, Fridays and Weekends. Holding Tuesdays and Thursdays --Labs today show WBC 6.6, hemoglobin 15.1, MCV 90.2, platelets 448. --Continue with Hydroxyurea  dosed as noted above. Continue ASA 81 mg PO daily --RTC in 12 weeks for labs/follow up and interval labs in 2 weeks to reassess blood counts, which were markedly aberrant at [facility] 2 weeks ago, but normal on our testing today   No orders of the defined types were placed in this encounter.   All questions were answered. The patient knows to call the clinic with any problems, questions or concerns.  A total of more than 30 minutes were spent on this encounter with face-to-face time and non-face-to-face time, including preparing to see the patient, ordering tests and/or medications,  counseling the patient and coordination of care as outlined above.   Norleen IVAR Kidney, MD Department of Hematology/Oncology [facility] Phone: [phone] Pager: [phone] Email: [email or web address]    04/25/2023 3:24 PM

## 2023-04-27 ENCOUNTER — Telehealth: Payer: Self-pay | Admitting: *Deleted

## 2023-04-27 ENCOUNTER — Ambulatory Visit (INDEPENDENT_AMBULATORY_CARE_PROVIDER_SITE_OTHER): Payer: 59

## 2023-04-27 DIAGNOSIS — Z8673 Personal history of transient ischemic attack (TIA), and cerebral infarction without residual deficits: Secondary | ICD-10-CM

## 2023-04-27 NOTE — Telephone Encounter (Signed)
 TCT patient 's wife, Myrla Asp, regarding recent lab results. Spoke to her.  Advised that his platelets are at 448 but his hemoglobin is normal and his creatinine have returned back to his baseline at 1.71.Dr. Rosaline Coma still recommends we recheck his blood in 2 weeks time to keep a close eye on the platelets. Candy voiced understanding and is glad that her husband's labs are returning to baseline. She is aware of  the lab appt on 05/08/23. Her husband is still taking the Hydroxyurea .

## 2023-04-27 NOTE — Telephone Encounter (Signed)
-----   Message from Rogerio Clay IV sent at 04/25/2023  3:25 PM EDT ----- Please let Gary [name] know (via his daughter) that his platelets are at 448 but his hemoglobin is normal and his creatinine have returned back to his baseline at 1.71.  I do still recommend we recheck his blood in 2 weeks time to keep a close eye on the platelets.  Please assure that he is continuing to take his hydroxyurea  and Eliquis  as prescribed. ----- Message ----- From: Dannis Dy, Lab In [place] Sent: 04/24/2023  11:42 AM EDT To: Ander Bame, MD

## 2023-04-28 ENCOUNTER — Ambulatory Visit: Admitting: Physician Assistant

## 2023-04-28 ENCOUNTER — Other Ambulatory Visit

## 2023-04-28 LAB — CUP PACEART REMOTE DEVICE CHECK
Date Time Interrogation Session: 20250420231206
Implantable Pulse Generator Implant Date: 20230421

## 2023-05-01 ENCOUNTER — Encounter: Payer: Self-pay | Admitting: Cardiovascular Disease

## 2023-05-01 ENCOUNTER — Other Ambulatory Visit: Payer: Self-pay

## 2023-05-01 ENCOUNTER — Emergency Department (HOSPITAL_BASED_OUTPATIENT_CLINIC_OR_DEPARTMENT_OTHER)

## 2023-05-01 ENCOUNTER — Emergency Department (HOSPITAL_BASED_OUTPATIENT_CLINIC_OR_DEPARTMENT_OTHER)
Admission: EM | Admit: 2023-05-01 | Discharge: 2023-05-01 | Disposition: A | Discharge status: Home / Self Care | Attending: Emergency Medicine | Admitting: Emergency Medicine

## 2023-05-01 DIAGNOSIS — I129 Hypertensive chronic kidney disease with stage 1 through stage 4 chronic kidney disease, or unspecified chronic kidney disease: Secondary | ICD-10-CM | POA: Diagnosis not present

## 2023-05-01 DIAGNOSIS — Z7901 Long term (current) use of anticoagulants: Secondary | ICD-10-CM | POA: Diagnosis not present

## 2023-05-01 DIAGNOSIS — Z79899 Other long term (current) drug therapy: Secondary | ICD-10-CM | POA: Diagnosis not present

## 2023-05-01 DIAGNOSIS — R519 Headache, unspecified: Secondary | ICD-10-CM | POA: Diagnosis present

## 2023-05-01 DIAGNOSIS — Z7982 Long term (current) use of aspirin: Secondary | ICD-10-CM | POA: Diagnosis not present

## 2023-05-01 DIAGNOSIS — N189 Chronic kidney disease, unspecified: Secondary | ICD-10-CM | POA: Insufficient documentation

## 2023-05-01 DIAGNOSIS — I160 Hypertensive urgency: Secondary | ICD-10-CM

## 2023-05-01 LAB — BASIC METABOLIC PANEL WITH GFR
Anion gap: 11 (ref 5–15)
BUN: 23 mg/dL (ref 8–23)
CO2: 25 mmol/L (ref 22–32)
Calcium: 9.8 mg/dL (ref 8.9–10.3)
Chloride: 105 mmol/L (ref 98–111)
Creatinine, Ser: 1.75 mg/dL — ABNORMAL HIGH (ref 0.61–1.24)
GFR, Estimated: 40 mL/min — ABNORMAL LOW (ref >60)
Glucose, Bld: 91 mg/dL (ref 70–99)
Potassium: 4.6 mmol/L (ref 3.5–5.1)
Sodium: 141 mmol/L (ref 135–145)

## 2023-05-01 LAB — CBC WITH DIFFERENTIAL/PLATELET
Abs Immature Granulocytes: 0.04 10*3/uL (ref 0.00–0.07)
Basophils Absolute: 0.1 10*3/uL (ref 0.0–0.1)
Basophils Relative: 1 %
Eosinophils Absolute: 0.2 10*3/uL (ref 0.0–0.5)
Eosinophils Relative: 3 %
HCT: 46.7 % (ref 39.0–52.0)
Hemoglobin: 15.1 g/dL (ref 13.0–17.0)
Immature Granulocytes: 1 %
Lymphocytes Relative: 10 %
Lymphs Abs: 0.7 10*3/uL (ref 0.7–4.0)
MCH: 29.4 pg (ref 26.0–34.0)
MCHC: 32.3 g/dL (ref 30.0–36.0)
MCV: 91 fL (ref 80.0–100.0)
Monocytes Absolute: 0.5 10*3/uL (ref 0.1–1.0)
Monocytes Relative: 7 %
Neutro Abs: 5.2 10*3/uL (ref 1.7–7.7)
Neutrophils Relative %: 78 %
Platelets: 408 10*3/uL — ABNORMAL HIGH (ref 150–400)
RBC: 5.13 MIL/uL (ref 4.22–5.81)
RDW: 14.5 % (ref 11.5–15.5)
WBC: 6.6 10*3/uL (ref 4.0–10.5)
nRBC: 0 % (ref 0.0–0.2)

## 2023-05-01 LAB — URINALYSIS, ROUTINE W REFLEX MICROSCOPIC
Bilirubin Urine: NEGATIVE
Glucose, UA: NEGATIVE mg/dL
Hgb urine dipstick: NEGATIVE
Ketones, ur: NEGATIVE mg/dL
Leukocytes,Ua: NEGATIVE
Nitrite: NEGATIVE
Specific Gravity, Urine: 1.013 (ref 1.005–1.030)
pH: 7 (ref 5.0–8.0)

## 2023-05-01 MED ORDER — DIPHENHYDRAMINE HCL 50 MG/ML IJ SOLN
12.5000 mg | Freq: Once | INTRAMUSCULAR | Status: AC
  Administered 2023-05-01: 12.5 mg via INTRAVENOUS
  Filled 2023-05-01: qty 1

## 2023-05-01 MED ORDER — FENTANYL CITRATE PF 50 MCG/ML IJ SOSY
50.0000 ug | PREFILLED_SYRINGE | Freq: Once | INTRAMUSCULAR | Status: AC
  Administered 2023-05-01: 50 ug via INTRAVENOUS
  Filled 2023-05-01: qty 1

## 2023-05-01 MED ORDER — HYDRALAZINE HCL 20 MG/ML IJ SOLN
5.0000 mg | Freq: Once | INTRAMUSCULAR | Status: AC
  Administered 2023-05-01: 5 mg via INTRAVENOUS
  Filled 2023-05-01: qty 1

## 2023-05-01 MED ORDER — PROCHLORPERAZINE EDISYLATE 10 MG/2ML IJ SOLN
10.0000 mg | Freq: Once | INTRAMUSCULAR | Status: AC
Start: 2023-05-01 — End: 2023-05-01
  Administered 2023-05-01: 10 mg via INTRAVENOUS
  Filled 2023-05-01: qty 2

## 2023-05-01 NOTE — ED Triage Notes (Signed)
 Pt POV reporting severe headache that began today, BP 200/97 at home. Pt took morning BP meds and took evening BP meds early after reading.

## 2023-05-01 NOTE — ED Provider Notes (Signed)
 [facility] AT [facility] Provider Note   CSN: [id number] Arrival date & time: 05/01/23  1736     History  Chief Complaint  Patient presents with   Hypertension   Headache    Gary [name] is a 78 y.o. male.  He is here with complaint of headache bitemporal radiating to front that started yesterday morning.  He rates it as 8 out of 10.  It has not responded to Tylenol .  They checked blood pressure today and found it to be significantly elevated.  He has some baseline visual dysfunction but no change.  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  No chest pain shortness of breath numbness weakness of an arm or leg.  Mild nausea no vomiting.  He has a history of traumatic brain injury and CKD, elevated platelets.  Recently diagnosed with a DVT and started on Eliquis  few weeks ago.  No trauma.  The history is provided by the patient and the spouse.  Headache Pain location:  L temporal and R temporal Quality:  Dull Severity currently:  8/10 Severity at highest:  8/10 Onset quality:  Gradual Duration:  2 days Timing:  Constant Progression:  Unchanged Chronicity:  New Relieved by:  Nothing Worsened by:  Nothing Ineffective treatments:  Acetaminophen  Associated symptoms: nausea   Associated symptoms: no abdominal pain, no cough, no fever, no focal weakness, no loss of balance, no neck pain, no visual change and no vomiting        Home Medications Prior to Admission medications   Medication Sig Start Date End Date Taking? Authorizing Provider  acetaminophen  (TYLENOL ) 325 MG tablet Take 1-2 tablets (325-650 mg total) by mouth every 4 (four) hours as needed for mild pain. 05/03/21   Setzer, Sandra J, PA-C  amLODipine  (NORVASC ) 5 MG tablet TAKE 1 TABLET(5 MG) BY MOUTH DAILY 07/18/22   Rawland Caddy, MD  amphetamine -dextroamphetamine  (ADDERALL  XR) 30 MG 24 hr capsule Take 1 capsule (30 mg total) by mouth daily. 02/26/23   Rawland Caddy, MD   amphetamine -dextroamphetamine  (ADDERALL  XR) 30 MG 24 hr capsule Take 1 capsule (30 mg total) by mouth daily. 02/26/23   Rawland Caddy, MD  amphetamine -dextroamphetamine  (ADDERALL ) 20 MG tablet Take 1 tablet (20 mg total) by mouth daily. 02/26/23   Rawland Caddy, MD  amphetamine -dextroamphetamine  (ADDERALL ) 20 MG tablet Take 1 tablet (20 mg total) by mouth daily. 02/26/23 02/26/24  Rawland Caddy, MD  apixaban  (ELIQUIS ) 5 MG TABS tablet Take 1 tablet (5 mg total) by mouth 2 (two) times daily. 04/24/23   Ander Bame, MD  aspirin  81 MG EC tablet Take 1 tablet (81 mg total) by mouth daily. Swallow whole. 05/03/21   Setzer, Sandra J, PA-C  azelastine  (ASTELIN ) 0.1 % nasal spray Place 1 spray into both nostrils 2 (two) times daily. Use in each nostril as directed 02/22/21   Ulyess Gammons, NP  b complex vitamins capsule Take 1 capsule by mouth daily.    [provider]  Brivaracetam  (BRIVIACT ) 50 MG TABS Take 50 mg by mouth 2 (two) times daily. 08/27/22   Johny Nap, NP  carvedilol  (COREG ) 3.125 MG tablet Take 1 tablet (3.125 mg total) by mouth 2 (two) times daily with a meal. 03/09/23   Rawland Caddy, MD  cetirizine  (ZYRTEC ) 10 MG tablet Take 1 tablet (10 mg total) by mouth daily. 02/22/21   Ulyess Gammons, NP  ezetimibe  (ZETIA ) 10 MG tablet Take 1 tablet (10 mg total) by mouth daily.  08/19/22   Sheryle Donning, MD  finasteride  (PROSCAR ) 5 MG tablet Take 1 tablet (5 mg total) by mouth daily. 03/21/21   Ulyess Gammons, NP  hydrALAZINE  (APRESOLINE ) 10 MG tablet Take 1 tablet (10 mg total) by mouth 3 (three) times daily. 08/25/22   Sheryle Donning, MD  hydroxyurea  (HYDREA ) 500 MG capsule Take 1 tablet (500 mg) on Mondays, Wednesdays, Fridays and Weekends. Holding on Tuesdays and Thursdays.  May take with food to minimize GI side effects. 04/24/23   Ander Bame, MD  isosorbide  mononitrate (IMDUR ) 30 MG 24 hr tablet Take 1 tablet (30 mg total) by mouth daily. 06/19/22  06/14/23  Sheryle Donning, MD  levothyroxine  (SYNTHROID ) 75 MCG tablet Take 1 tablet (75 mcg total) by mouth daily before breakfast. 10/22/22   Rawland Caddy, MD  meclizine  (ANTIVERT ) 25 MG tablet Take 1 tablet (25 mg total) by mouth 3 (three) times daily as needed for dizziness. 01/15/23   Nathanael Baker, DO  Multiple Vitamin (MULTIVITAMIN WITH MINERALS) TABS tablet Take 1 tablet by mouth daily.    [provider]  ondansetron  (ZOFRAN ) 4 MG tablet Take 1 tablet (4 mg total) by mouth every 6 (six) hours. 01/15/23   Nathanael Baker, DO  oxyCODONE  (ROXICODONE ) 5 MG immediate release tablet Take 1 tablet (5 mg total) by mouth every 6 (six) hours as needed for up to 10 doses. 04/09/23   Curatolo, Adam, DO  pantoprazole  (PROTONIX ) 40 MG tablet TAKE 1 TABLET(40 MG) BY MOUTH DAILY 06/05/21   Ulyess Gammons, NP  rosuvastatin  (CRESTOR ) 20 MG tablet Take 1 tablet (20 mg total) by mouth daily. 03/09/23   Rawland Caddy, MD  sertraline  (ZOLOFT ) 100 MG tablet Take 1 tablet (100 mg total) by mouth daily. 10/22/22   Rawland Caddy, MD      Allergies    Levofloxacin , Atomoxetine hcl, and Shellfish allergy    Review of Systems   Review of Systems  Constitutional:  Negative for fever.  Respiratory:  Negative for cough.   Cardiovascular:  Negative for chest pain.  Gastrointestinal:  Positive for nausea. Negative for abdominal pain and vomiting.  Musculoskeletal:  Negative for neck pain.  Neurological:  Positive for headaches. Negative for focal weakness and loss of balance.    Physical Exam Updated Vital Signs BP (!) 203/93   Pulse 72   Temp 97.6 F (36.4 C) (Oral)   Resp 18   Ht 5\' 10"  (1.778 m)   Wt 70.8 kg   SpO2 99%   BMI 22.38 kg/m  Physical Exam Vitals and nursing note reviewed.  Constitutional:      General: He is not in acute distress.    Appearance: He is well-developed.  HENT:     Head: Normocephalic and atraumatic.  Eyes:     Conjunctiva/sclera: Conjunctivae  normal.  Cardiovascular:     Rate and Rhythm: Normal rate and regular rhythm.     Heart sounds: No murmur heard. Pulmonary:     Effort: Pulmonary effort is normal. No respiratory distress.     Breath sounds: Normal breath sounds.  Abdominal:     Palpations: Abdomen is soft.     Tenderness: There is no abdominal tenderness.  Musculoskeletal:        General: No swelling.     Cervical back: Neck supple.  Skin:    General: Skin is warm and dry.     Capillary Refill: Capillary refill takes less than 2 seconds.  Neurological:  Mental Status: He is alert.     GCS: GCS eye subscore is 4. GCS verbal subscore is 5. GCS motor subscore is 6.     Cranial Nerves: No cranial nerve deficit.     Sensory: No sensory deficit.     Motor: No weakness.     ED Results / Procedures / Treatments   Labs (all labs ordered are listed, but only abnormal results are displayed) Labs Reviewed  BASIC METABOLIC PANEL WITH GFR - Abnormal; Notable for the following components:      Result Value   Creatinine, Ser 1.75 (*)    GFR, Estimated 40 (*)    All other components within normal limits  CBC WITH DIFFERENTIAL/PLATELET - Abnormal; Notable for the following components:   Platelets 408 (*)    All other components within normal limits  URINALYSIS, ROUTINE W REFLEX MICROSCOPIC - Abnormal; Notable for the following components:   Protein, ur TRACE (*)    All other components within normal limits    EKG None  Radiology CT Head Wo Contrast Result Date: 05/01/2023 CLINICAL DATA:  Headache, new onset. EXAM: CT HEAD WITHOUT CONTRAST TECHNIQUE: Contiguous axial images were obtained from the base of the skull through the vertex without intravenous contrast. RADIATION DOSE REDUCTION: This exam was performed according to the departmental dose-optimization program which includes automated exposure control, adjustment of the mA and/or kV according to patient size and/or use of iterative reconstruction technique.  COMPARISON:  MR head without contrast 01/15/2023. 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 Mild white matter changes are stable. No acute infarct, hemorrhage, or mass lesion is present. Deep brain nuclei are within normal limits. The ventricles are of normal size. No significant extraaxial fluid collection is present. The brainstem and cerebellum are within normal limits. Midline structures are within normal limits. Vascular: No hyperdense vessel or unexpected calcification. Skull: Calvarium is intact. No focal lytic or blastic lesions are present. No significant extracranial soft tissue lesion is present. Sinuses/Orbits: Minimal fluid is present left maxillary sinus. The paranasal sinuses and mastoid air cells are otherwise clear. ORIF of the anterior right maxillary sinus noted. The globes and orbits are within normal limits. IMPRESSION: 1. No acute intracranial abnormality or significant interval change. 2. 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 3. Stable mild white matter disease. This likely reflects the sequela of chronic microvascular ischemia. 4. Minimal fluid in the left maxillary sinus. Electronically Signed   By: Audree Leas M.D.   On: 05/01/2023 19:08    Procedures Procedures    Medications Ordered in ED Medications  prochlorperazine  (COMPAZINE ) injection 10 mg (10 mg Intravenous Given 05/01/23 1934)  diphenhydrAMINE  (BENADRYL ) injection 12.5 mg (12.5 mg Intravenous Given 05/01/23 1934)  fentaNYL  (SUBLIMAZE ) injection 50 mcg (50 mcg Intravenous Given 05/01/23 2049)  hydrALAZINE  (APRESOLINE ) injection 5 mg (5 mg Intravenous Given 05/01/23 2138)    ED Course/ Medical Decision Making/ A&P Clinical Course as of 05/02/23 1115  Fri May 01, 2023  1917 Head CT did not show any acute findings no acute bleeding.  Will try migraine cocktail. [MB]  1939 ECG not crossing into epic.  Normal sinus rhythm nonspecific ST's,  prolonged QT.  No significant change from prior. [MB]  2043 Patient states his headache is a little bit better but not completely resolved.  Blood pressure was still little up. [MB]  2217 Patient states his headache is mild now and his blood pressures improved.  They are comfortable plan  for discharge and outpatient follow-up with PCP. [MB]    Clinical Course User Index [MB] Tonya Fredrickson, MD                                 Medical Decision Making Amount and/or Complexity of Data Reviewed Labs: ordered. Radiology: ordered.  Risk Prescription drug management.   This patient complains of headache elevated blood pressure; this involves an extensive number of treatment Options and is a complaint that carries with it a high risk of complications and morbidity. The differential includes hypertensive urgency emergency, stroke, bleed, primary hypertension  I ordered, reviewed and interpreted labs, which included CBC with chronically elevated platelets, chemistries with CKD similar to priors, urinalysis without signs of infection I ordered medication migraine cocktail, hydralazine  and reviewed PMP when indicated. I ordered imaging studies which included head CT and I independently    visualized and interpreted imaging which showed no acute findings Additional history obtained from patient's wife Previous records obtained and reviewed in epic including recent PCP and oncology notes Cardiac monitoring reviewed, sinus rhythm Social determinants considered, stress, housing insecurity Critical Interventions: None  After the interventions stated above, I reevaluated the patient and found patient's blood pressure to be improved and headache improved Admission and further testing considered, as patient is feeling better and blood pressure normalizing feel he can be followed up outpatient with his PCP.  Return instructions discussed.         Final Clinical Impression(s) / ED  Diagnoses Final diagnoses:  Hypertensive urgency  Generalized headache    Rx / DC Orders ED Discharge Orders     None         Tonya Fredrickson, MD 05/02/23 [phone]

## 2023-05-01 NOTE — Discharge Instructions (Signed)
 You were seen in the emergency department for headache and elevated blood pressure.  You had lab work and a CAT scan of your head that did not show any significant abnormalities.  Your blood pressure and headache improved slowly with some medication.  Please continue your regular medication and track your blood pressures while at home.  Contact your doctor on Monday for close follow-up.  Return if any worsening or concerning symptoms.

## 2023-05-08 ENCOUNTER — Other Ambulatory Visit: Payer: Self-pay | Admitting: Hematology and Oncology

## 2023-05-08 ENCOUNTER — Inpatient Hospital Stay: Attending: Hematology and Oncology

## 2023-05-08 DIAGNOSIS — D473 Essential (hemorrhagic) thrombocythemia: Secondary | ICD-10-CM

## 2023-05-08 LAB — CBC WITH DIFFERENTIAL (CANCER CENTER ONLY)
Abs Immature Granulocytes: 0.06 10*3/uL (ref 0.00–0.07)
Basophils Absolute: 0.1 10*3/uL (ref 0.0–0.1)
Basophils Relative: 1 %
Eosinophils Absolute: 0.2 10*3/uL (ref 0.0–0.5)
Eosinophils Relative: 3 %
HCT: 42.5 % (ref 39.0–52.0)
Hemoglobin: 14 g/dL (ref 13.0–17.0)
Immature Granulocytes: 1 %
Lymphocytes Relative: 12 %
Lymphs Abs: 0.8 10*3/uL (ref 0.7–4.0)
MCH: 29.7 pg (ref 26.0–34.0)
MCHC: 32.9 g/dL (ref 30.0–36.0)
MCV: 90.2 fL (ref 80.0–100.0)
Monocytes Absolute: 0.6 10*3/uL (ref 0.1–1.0)
Monocytes Relative: 8 %
Neutro Abs: 5.4 10*3/uL (ref 1.7–7.7)
Neutrophils Relative %: 75 %
Platelet Count: 437 10*3/uL — ABNORMAL HIGH (ref 150–400)
RBC: 4.71 MIL/uL (ref 4.22–5.81)
RDW: 14.6 % (ref 11.5–15.5)
WBC Count: 7.2 10*3/uL (ref 4.0–10.5)
nRBC: 0 % (ref 0.0–0.2)

## 2023-05-08 NOTE — Progress Notes (Signed)
 [facility] Summary Report / Loop Recorder

## 2023-05-08 NOTE — Addendum Note (Signed)
 Addended by: Edra Govern D on: 05/08/2023 04:16 PM   Modules accepted: Orders

## 2023-05-12 ENCOUNTER — Encounter (HOSPITAL_BASED_OUTPATIENT_CLINIC_OR_DEPARTMENT_OTHER): Payer: Self-pay

## 2023-05-12 DIAGNOSIS — I7300 Raynaud's syndrome without gangrene: Secondary | ICD-10-CM

## 2023-05-12 DIAGNOSIS — F329 Major depressive disorder, single episode, unspecified: Secondary | ICD-10-CM

## 2023-05-12 NOTE — Telephone Encounter (Signed)
 Please review and advise.

## 2023-05-14 MED ORDER — AMLODIPINE BESYLATE 5 MG PO TABS: 7.5000 mg | ORAL_TABLET | Freq: Every day | ORAL | 3 refills | Status: AC

## 2023-05-14 NOTE — Telephone Encounter (Signed)
 Lets increase the amlodipine  to 7.5 mg once daily.  Continue other meds and okay to take extra hydralazine  10 mg if BP is > 150 systolic.

## 2023-05-19 NOTE — Progress Notes (Unsigned)
 Subjective:    Patient ID: Gary [name], male    DOB: 20-Nov-1945, 78 y.o.   MRN: [id number]  HPI   Pain Inventory Average Pain {NUMBERS; 0-10:5044} Pain Right Now {NUMBERS; 0-10:5044} My pain is {PAIN DESCRIPTION:21022940}  In the last 24 hours, has pain interfered with the following? General activity {NUMBERS; 0-10:5044} Relation with others {NUMBERS; 0-10:5044} Enjoyment of life {NUMBERS; 0-10:5044} What TIME of day is your pain at its worst? {time of day:24191} Sleep (in general) {BHH GOOD/FAIR/POOR:22877}  Pain is worse with: {ACTIVITIES:21022942} Pain improves with: {PAIN IMPROVES WUJW:11914782} Relief from Meds: {NUMBERS; 0-10:5044}  Family History  Problem Relation Age of Onset   Heart disease Mother    Hypertension Mother    Stroke Mother    Polycythemia Father    Polycythemia Brother    Leukemia Brother    Social History   Socioeconomic History   Marital status: Married    Spouse name: Sherrell Wasem   Number of children: Not on file   Years of education: Not on file   Highest education level: Not on file  Occupational History   Occupation: Quarry manager    Comment: [employer]  Tobacco Use   Smoking status: Former    Types: Cigarettes   Smokeless tobacco: Never  Vaping Use   Vaping status: Never Used  Substance and Sexual Activity   Alcohol use: Not Currently   Drug use: Never   Sexual activity: Yes  Other Topics Concern   Not on file  Social History Narrative   ** Merged History Encounter **       Social Drivers of Health   Financial Resource Strain: Medium Risk (04/10/2023)   Received from [facility]   Overall Financial Resource Strain (CARDIA)    Difficulty of Paying Living Expenses: Somewhat hard  Food Insecurity: No Food Insecurity (04/10/2023)   Received from [facility]   Hunger Vital Sign    Worried About Running Out of Food in the Last Year: Never true    Ran Out of Food in the Last Year: Never true  Transportation  Needs: No Transportation Needs (04/10/2023)   Received from [facility] - Transportation    Lack of Transportation (Medical): No    Lack of Transportation (Non-Medical): No  Physical Activity: Unknown (02/19/2022)   [facility]   Exercise Vital Sign    Days of Exercise per Week: 0 days    Minutes of Exercise per Session: Not on file  Stress: Stress Concern Present (02/19/2022)   Received from [facility] - Occupational Stress Questionnaire    Feeling of Stress : To some extent  Social Connections: Somewhat Isolated (02/19/2022)   [facility]   Social Network    How would you rate your social network (family, work, friends)?: Restricted participation with some degree of social isolation   Past Surgical History:  Procedure Laterality Date   LOOP RECORDER INSERTION N/A 04/26/2021   Procedure: LOOP RECORDER INSERTION;  Surgeon: Luana Rumple, MD;  Location: [facility] INVASIVE CV LAB;  Service: Cardiovascular;  Laterality: N/A;   MINOR REMOVAL OF MANDIBULAR HARDWARE N/A 12/15/2019   Procedure: REMOVAL OF RIGHT LATERAL ORBITAL MINIPLATE;  Surgeon: Thornell Flirt, DO;  Location: [facility] OR;  Service: Plastics;  Laterality: N/A;   ORIF MANDIBULAR FRACTURE Bilateral 07/27/2019   Procedure: OPEN REDUCTION INTERNAL FIXATION (ORIF) OF COMPLEX ZYGOMATIC FRACTURE;  Surgeon: Thornell Flirt,  DO;  Location: [facility] OR;  Service: Plastics;  Laterality: Bilateral;  2 hours, please   Past Surgical History:  Procedure Laterality Date   LOOP RECORDER INSERTION N/A 04/26/2021   Procedure: LOOP RECORDER INSERTION;  Surgeon: Luana Rumple, MD;  Location: [facility] INVAS[facility] CV [facility];  Service: Cardiovascular;  Laterality: N/A;   MINOR REMOVAL OF MANDIBULAR HARDWARE N/A 12/15/2019   Procedure: REMOVAL OF RIGHT LATERAL ORBITAL MINIPLATE;  Surgeon: Thornell Flirt, DO;  Location: [facility] OR;   Service: Plastics;  Laterality: N/A;   ORIF MANDIBULAR FRACTURE Bilateral 07/27/2019   Procedure: OPEN REDUCTION INTERNAL FIXATION (ORIF) OF COMPLEX ZYGOMATIC FRACTURE;  Surgeon: Thornell Flirt, DO;  Location: [facility] OR;  Service: Plastics;  Laterality: Bilateral;  2 hours, please   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   Trauma    There were no vitals taken for this visit.  Opioid Risk Score:   Fall Risk Score:  `1  Depression screen [facility] 2/9     01/21/2023   11:43 AM 02/19/2022    1:28 PM 12/04/2021   10:55 AM 05/29/2021   11:07 AM 01/30/2021   11:37 AM 10/26/2020    9:44 AM 10/03/2020   11:40 AM  Depression screen PHQ 2/9  Decreased Interest 1 1 1  0  1 2  Down, Depressed, Hopeless 1 1 1  0 1 1 2   PHQ - 2 Score 2 2 2  0 1 2 4   Altered sleeping      0   Tired, decreased energy      3   Change in appetite      0   Feeling bad or failure about yourself       0   Trouble concentrating      3   Moving slowly or fidgety/restless      2   Suicidal thoughts      0   PHQ-9 Score      10   Difficult doing work/chores      Somewhat difficult     Review of Systems     Objective:   Physical Exam        Assessment & Plan:

## 2023-05-20 ENCOUNTER — Encounter: Payer: Self-pay | Admitting: Physical Medicine & Rehabilitation

## 2023-05-20 ENCOUNTER — Encounter: Payer: 59 | Attending: Physical Medicine & Rehabilitation | Admitting: Physical Medicine & Rehabilitation

## 2023-05-20 DIAGNOSIS — Z87820 Personal history of traumatic brain injury: Secondary | ICD-10-CM | POA: Diagnosis not present

## 2023-05-20 DIAGNOSIS — F900 Attention-deficit hyperactivity disorder, predominantly inattentive type: Secondary | ICD-10-CM | POA: Insufficient documentation

## 2023-05-20 MED ORDER — AMPHETAMINE-DEXTROAMPHETAMINE 20 MG PO TABS
20.0000 mg | ORAL_TABLET | Freq: Every day | ORAL | 0 refills | Status: DC
Start: 2023-05-20 — End: 2023-10-02

## 2023-05-20 MED ORDER — AMPHETAMINE-DEXTROAMPHET ER 30 MG PO CP24: 30.0000 mg | ORAL_CAPSULE | Freq: Every day | ORAL | 0 refills | Status: DC

## 2023-05-20 MED ORDER — AMPHETAMINE-DEXTROAMPHETAMINE 20 MG PO TABS: 20.0000 mg | ORAL_TABLET | Freq: Every day | ORAL | 0 refills | Status: DC

## 2023-05-20 NOTE — Patient Instructions (Signed)
 [facility] Support Group When: The group meets the second Tuesday of each month from 4pm to 5pm Where: [facility] at [employer] of [phone] and [address]) Enter through the main entrance on the first floor. Meeting room is on the right just after you pass the cafe.

## 2023-05-20 NOTE — Progress Notes (Signed)
 Subjective:    Patient ID: Gary [name], male    DOB: 11-Jan-1945, 78 y.o.   MRN: [id number]  HPI [name] is here in follow up of his TBI. He says he's about the "same".   He continues to have issues with safety awareness. As a result he puts himself at risk for injury and falls. He doesn't recognize risk. He frequently burns things in the kitchen due to his distracted. STM remains a problem as well. He remains on adderall  for attention.   He started at the gym earlier this month. He has started with using machines through a program called "quick start".  The aid goes with him to help with use and safety. He feels better after he works out.   He was in the ED for a popliteal DVT as well as for HTN/H/A   Mood has been ok. His wife stopped his zoloft  as she didn't feel it was helping. His vitamin B and D levels were apparently low (we have checked in past) and supplementation has seemed to help.  he has been eating less over the holiday, inside more which may have contributed.    Pain Inventory Average Pain 3 Pain Right Now 0 My pain is intermittent and aching  LOCATION OF PAIN  Headache  BOWEL Number of stools per week: 28 per week Oral laxative use No  Type of laxative none Enema or suppository use No  History of colostomy No  Incontinent No   BLADDER Normal Difficulty starting stream Yes    Mobility walk with assistance use a cane ability to climb steps?  yes do you drive?  no Do you have any goals in this area?  yes  Function employed # of hrs/week worker's comp I need assistance with the following:  meal prep, household duties, and shopping Do you have any goals in this area?  yes  Neuro/Psych bladder control problems bowel control problems weakness numbness trouble walking dizziness confusion depression anxiety  Prior Studies Any changes since last visit?  yes CT/MRI  of head at [facility]   Physicians involved in your care Any changes since last  visit?  no   Family History  Problem Relation Age of Onset   Heart disease Mother    Hypertension Mother    Stroke Mother    Polycythemia Father    Polycythemia Brother    Leukemia Brother    Social History   Socioeconomic History   Marital status: Married    Spouse name: Zair Casserly   Number of children: Not on file   Years of education: Not on file   Highest education level: Not on file  Occupational History   Occupation: Quarry manager    Comment: [employer]  Tobacco Use   Smoking status: Former    Types: Cigarettes   Smokeless tobacco: Never  Vaping Use   Vaping status: Never Used  Substance and Sexual Activity   Alcohol use: Not Currently   Drug use: Never   Sexual activity: Yes  Other Topics Concern   Not on file  Social History Narrative   ** Merged History Encounter **       Social Drivers of Health   Financial Resource Strain: Medium Risk (04/10/2023)   Received from [facility]   Overall Financial Resource Strain (CARDIA)    Difficulty of Paying Living Expenses: Somewhat hard  Food Insecurity: No Food Insecurity (04/10/2023)   Received from [facility]   Hunger Vital Sign    Worried  About Running Out of Food in the Last Year: Never true    Ran Out of Food in the Last Year: Never true  Transportation Needs: No Transportation Needs (04/10/2023)   Received from [facility] - Transportation    Lack of Transportation (Medical): No    Lack of Transportation (Non-Medical): No  Physical Activity: Unknown (02/19/2022)   [facility]   Exercise Vital Sign    Days of Exercise per Week: 0 days    Minutes of Exercise per Session: Not on file  Stress: Stress Concern Present (02/19/2022)   Received from [facility] - Occupational Stress Questionnaire    Feeling of Stress : To some extent  Social Connections: Somewhat Isolated (02/19/2022)   [name]   Social Network    How would you rate your social network (family, work, friends)?: Restricted participation with some degree of social isolation   Past Surgical History:  Procedure Laterality Date   LOOP RECORDER INSERTION N/A 04/26/2021   Procedure: LOOP RECORDER INSERTION;  Surgeon: Luana Rumple, MD;  Location: [facility] INVASIVE CV [facility];  Service: Cardiovascular;  Laterality: N/A;   MINOR REMOVAL OF MANDIBULAR HARDWARE N/A 12/15/2019   Procedure: REMOVAL OF RIGHT LATERAL ORBITAL MINIPLATE;  Surgeon: Thornell Flirt, DO;  Location: [facility] OR;  Service: Plastics;  Laterality: N/A;   ORIF MANDIBULAR FRACTURE Bilateral 07/27/2019   Procedure: OPEN REDUCTION INTERNAL FIXATION (ORIF) OF COMPLEX ZYGOMATIC FRACTURE;  Surgeon: Thornell Flirt, DO;  Location: [facility] OR;  Service: Plastics;  Laterality: Bilateral;  2 hours, please   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   Trauma    BP 138/71   Pulse 80   Wt 159 lb (72.1 kg)   SpO2 96%   BMI 22.81 kg/m   Opioid Risk Score:   Fall Risk Score:  `1  Depression screen [facility] 2/9     01/21/2023   11:43 AM 02/19/2022    1:28 PM 12/04/2021   10:55 AM 05/29/2021   11:07 AM 01/30/2021   11:37 AM 10/26/2020    9:44 AM 10/03/2020   11:40 AM  Depression screen PHQ 2/9  Decreased Interest 1 1 1  0  1 2  Down, Depressed, Hopeless 1 1 1  0 1 1 2   PHQ - 2 Score 2 2 2  0 1 2 4   Altered sleeping      0   Tired, decreased energy      3   Change in appetite      0   Feeling bad or failure about yourself       0   Trouble concentrating      3   Moving slowly or fidgety/restless      2    Suicidal thoughts      0   PHQ-9 Score      10   Difficult doing work/chores      Somewhat difficult     Review of Systems  Musculoskeletal:  Positive for gait problem.  Neurological:  Positive for dizziness, weakness, light-headedness and numbness.  Psychiatric/Behavioral:  Positive for confusion.        Anxiety, depression  All other systems reviewed and are negative.      Objective:   Physical Exam  General: No acute distress HEENT: NCAT, EOMI, oral membranes moist Cards: reg rate  Chest: normal effort Abdomen: Soft, NT, ND Skin: dry, intact Extremities: no edema Psych: pleasant and appropriate  Skin: hands sl cool Neuro: Patient is alert and oriented to month but not the date or year.   He is able to follow basic commands.  Slow to process. Stm deficits. Often looks to his wife to finish answers for him. Wife corrects him often. Cranial nerve exam was somewhat nonfocal.  Strength is grossly 5/5 .     Gait wide based but steady.  No focal limb ataxia was seen.  No gross sensory abnormalities. Uses cane for balance. Tends to lean forward without cane. Romberg test is equivocal  Musculoskeletal: minimal crepitus at knees. Full knee rom. Some pain along quad tendon with resisted extension. Minimal pain with palpation of quad tendon or joint lines. Aaron Aas            PLAN: 1. 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 Mood issues as well.   -I'm pleased that he's now going to the gym.  -he still requires an aid/assistant to guide him at home and in the community due to his poor attention/memory as well as limited safety awareness.  2.  Popliteal DVT--continue eliquis --recheck dopplers in 3-6 mos? 3. Pain in toes/feet/fingers/raynaud like appearance as well as low back pain:  -?some improvement with amlodipine ---continue 5mg  -keep hands and feet warms, covered when  possible.     -tylenol  prn  .  4. Depression:               -off zoloft  100mg  daily per his wife.  -Vitamin B and D seem to be helping energy demeanor as a whole.               -keep Adderall  XR to 30 mg daily mg daily.   -Continue Adderall  immediate release 20 mg daily in the afternoon             -continue [facility]              - I'm happy that he's going to the gym now. It should help!.   5. 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 Insomnia             -being more active socially and physically during the day should help  with sleep too             -voiding before bed, PM "fluid rationing" 18. Mild thrombocytosis:                 20 minutes of face to face patient care time were spent during this visit. All questions were encouraged and answered.  Case was discussed with WC case Production designer, theatre/television/film.  Follow up with me in 6 mos.

## 2023-05-27 ENCOUNTER — Ambulatory Visit: Payer: 59 | Admitting: Adult Health

## 2023-06-02 ENCOUNTER — Ambulatory Visit (INDEPENDENT_AMBULATORY_CARE_PROVIDER_SITE_OTHER): Payer: 59

## 2023-06-02 DIAGNOSIS — Z8673 Personal history of transient ischemic attack (TIA), and cerebral infarction without residual deficits: Secondary | ICD-10-CM | POA: Diagnosis not present

## 2023-06-02 LAB — CUP PACEART REMOTE DEVICE CHECK
Date Time Interrogation Session: 20250526231932
Implantable Pulse Generator Implant Date: 20230421

## 2023-06-03 ENCOUNTER — Ambulatory Visit: Payer: Self-pay | Admitting: Cardiovascular Disease

## 2023-06-03 ENCOUNTER — Ambulatory Visit: Admitting: Cardiology

## 2023-06-15 NOTE — Addendum Note (Signed)
 Addended by: Edra Govern D on: 06/15/2023 05:32 PM   Modules accepted: Orders

## 2023-06-15 NOTE — Progress Notes (Signed)
 [facility] Summary Report / Loop Recorder

## 2023-06-19 ENCOUNTER — Other Ambulatory Visit: Payer: Self-pay | Admitting: Cardiology

## 2023-06-19 DIAGNOSIS — I429 Cardiomyopathy, unspecified: Secondary | ICD-10-CM

## 2023-06-19 DIAGNOSIS — I5189 Other ill-defined heart diseases: Secondary | ICD-10-CM

## 2023-07-02 ENCOUNTER — Ambulatory Visit (INDEPENDENT_AMBULATORY_CARE_PROVIDER_SITE_OTHER)

## 2023-07-02 ENCOUNTER — Ambulatory Visit: Payer: Self-pay | Admitting: Cardiovascular Disease

## 2023-07-02 DIAGNOSIS — I429 Cardiomyopathy, unspecified: Secondary | ICD-10-CM

## 2023-07-02 LAB — CUP PACEART REMOTE DEVICE CHECK
Date Time Interrogation Session: 20250625231552
Implantable Pulse Generator Implant Date: 20230421

## 2023-07-05 ENCOUNTER — Other Ambulatory Visit: Payer: Self-pay

## 2023-07-05 ENCOUNTER — Emergency Department (HOSPITAL_COMMUNITY)
Admission: EM | Admit: 2023-07-05 | Discharge: 2023-07-05 | Disposition: A | Discharge status: Home / Self Care | Attending: Emergency Medicine | Admitting: Emergency Medicine

## 2023-07-05 ENCOUNTER — Encounter (HOSPITAL_COMMUNITY): Payer: Self-pay | Admitting: Emergency Medicine

## 2023-07-05 ENCOUNTER — Encounter (HOSPITAL_COMMUNITY): Payer: Self-pay

## 2023-07-05 ENCOUNTER — Ambulatory Visit (HOSPITAL_COMMUNITY)
Admission: EM | Admit: 2023-07-05 | Discharge: 2023-07-05 | Disposition: A | Discharge status: Home / Self Care | Source: Home / Self Care

## 2023-07-05 ENCOUNTER — Ambulatory Visit (INDEPENDENT_AMBULATORY_CARE_PROVIDER_SITE_OTHER)

## 2023-07-05 ENCOUNTER — Emergency Department (HOSPITAL_COMMUNITY)

## 2023-07-05 DIAGNOSIS — I509 Heart failure, unspecified: Secondary | ICD-10-CM | POA: Insufficient documentation

## 2023-07-05 DIAGNOSIS — M79674 Pain in right toe(s): Secondary | ICD-10-CM

## 2023-07-05 DIAGNOSIS — L03031 Cellulitis of right toe: Secondary | ICD-10-CM | POA: Insufficient documentation

## 2023-07-05 DIAGNOSIS — Z7901 Long term (current) use of anticoagulants: Secondary | ICD-10-CM | POA: Diagnosis not present

## 2023-07-05 DIAGNOSIS — Z7982 Long term (current) use of aspirin: Secondary | ICD-10-CM | POA: Diagnosis not present

## 2023-07-05 DIAGNOSIS — D5912 Cold autoimmune hemolytic anemia: Secondary | ICD-10-CM | POA: Diagnosis present

## 2023-07-05 LAB — CBC WITH DIFFERENTIAL/PLATELET
Abs Immature Granulocytes: 0.04 10*3/uL (ref 0.00–0.07)
Abs Immature Granulocytes: 0.05 10*3/uL (ref 0.00–0.07)
Basophils Absolute: 0.1 10*3/uL (ref 0.0–0.1)
Basophils Absolute: 0.1 10*3/uL (ref 0.0–0.1)
Basophils Relative: 1 %
Basophils Relative: 1 %
Eosinophils Absolute: 0.2 10*3/uL (ref 0.0–0.5)
Eosinophils Absolute: 0.2 10*3/uL (ref 0.0–0.5)
Eosinophils Relative: 3 %
Eosinophils Relative: 3 %
HCT: 39.7 % (ref 39.0–52.0)
HCT: 37.4 % — ABNORMAL LOW (ref 39.0–52.0)
Hemoglobin: 13.3 g/dL (ref 13.0–17.0)
Hemoglobin: 14 g/dL (ref 13.0–17.0)
Immature Granulocytes: 1 %
Immature Granulocytes: 1 %
Lymphocytes Relative: 13 %
Lymphocytes Relative: 13 %
Lymphs Abs: 0.9 10*3/uL (ref 0.7–4.0)
Lymphs Abs: 0.9 10*3/uL (ref 0.7–4.0)
MCH: 31.2 pg (ref 26.0–34.0)
MCH: 36.1 pg — ABNORMAL HIGH (ref 26.0–34.0)
MCHC: 33.5 g/dL (ref 30.0–36.0)
MCHC: 37.4 g/dL — ABNORMAL HIGH (ref 30.0–36.0)
MCV: 93.2 fL (ref 80.0–100.0)
MCV: 96.4 fL (ref 80.0–100.0)
Monocytes Absolute: 0.5 10*3/uL (ref 0.1–1.0)
Monocytes Absolute: 0.5 10*3/uL (ref 0.1–1.0)
Monocytes Relative: 8 %
Monocytes Relative: 8 %
Neutro Abs: 5 10*3/uL (ref 1.7–7.7)
Neutro Abs: 4.7 10*3/uL (ref 1.7–7.7)
Neutrophils Relative %: 74 %
Neutrophils Relative %: 74 %
Platelets: 423 10*3/uL — ABNORMAL HIGH (ref 150–400)
Platelets: 480 10*3/uL — ABNORMAL HIGH (ref 150–400)
RBC: 4.26 MIL/uL (ref 4.22–5.81)
RBC: 3.88 MIL/uL — ABNORMAL LOW (ref 4.22–5.81)
RDW: 16.4 % — ABNORMAL HIGH (ref 11.5–15.5)
RDW: 20.1 % — ABNORMAL HIGH (ref 11.5–15.5)
WBC: 6.7 10*3/uL (ref 4.0–10.5)
WBC: 6.3 10*3/uL (ref 4.0–10.5)
nRBC: 0 % (ref 0.0–0.2)
nRBC: 0 % (ref 0.0–0.2)

## 2023-07-05 LAB — COMPREHENSIVE METABOLIC PANEL WITH GFR
ALT: 14 U/L (ref 0–44)
ALT: 18 U/L (ref 0–44)
AST: 29 U/L (ref 15–41)
AST: 36 U/L (ref 15–41)
Albumin: 3.4 g/dL — ABNORMAL LOW (ref 3.5–5.0)
Albumin: 3.5 g/dL (ref 3.5–5.0)
Alkaline Phosphatase: 57 U/L (ref 38–126)
Alkaline Phosphatase: 62 U/L (ref 38–126)
Anion gap: 10 (ref 5–15)
Anion gap: 12 (ref 5–15)
BUN: 24 mg/dL — ABNORMAL HIGH (ref 8–23)
BUN: 24 mg/dL — ABNORMAL HIGH (ref 8–23)
CO2: 22 mmol/L (ref 22–32)
CO2: 22 mmol/L (ref 22–32)
Calcium: 9 mg/dL (ref 8.9–10.3)
Calcium: 9.2 mg/dL (ref 8.9–10.3)
Chloride: 110 mmol/L (ref 98–111)
Chloride: 109 mmol/L (ref 98–111)
Creatinine, Ser: 1.92 mg/dL — ABNORMAL HIGH (ref 0.61–1.24)
Creatinine, Ser: 2.02 mg/dL — ABNORMAL HIGH (ref 0.61–1.24)
GFR, Estimated: 36 mL/min — ABNORMAL LOW (ref >60)
GFR, Estimated: 34 mL/min — ABNORMAL LOW (ref >60)
Glucose, Bld: 93 mg/dL (ref 70–99)
Glucose, Bld: 69 mg/dL — ABNORMAL LOW (ref 70–99)
Potassium: 4.6 mmol/L (ref 3.5–5.1)
Potassium: 4.9 mmol/L (ref 3.5–5.1)
Sodium: 142 mmol/L (ref 135–145)
Sodium: 143 mmol/L (ref 135–145)
Total Bilirubin: 0.9 mg/dL (ref 0.0–1.2)
Total Bilirubin: 0.8 mg/dL (ref 0.0–1.2)
Total Protein: 6.3 g/dL — ABNORMAL LOW (ref 6.5–8.1)
Total Protein: 6.9 g/dL (ref 6.5–8.1)

## 2023-07-05 LAB — SEDIMENTATION RATE: Sed Rate: 2 mm/h (ref 0–16)

## 2023-07-05 LAB — URIC ACID: Uric Acid, Serum: 8.9 mg/dL — ABNORMAL HIGH (ref 3.7–8.6)

## 2023-07-05 MED ORDER — IOHEXOL 350 MG/ML SOLN
100.0000 mL | Freq: Once | INTRAVENOUS | Status: AC | PRN
  Administered 2023-07-05: 100 mL via INTRAVENOUS

## 2023-07-05 MED ORDER — SODIUM CHLORIDE 0.9 % IV BOLUS
500.0000 mL | Freq: Once | INTRAVENOUS | Status: AC
  Administered 2023-07-05: 500 mL via INTRAVENOUS

## 2023-07-05 MED ORDER — CEPHALEXIN 250 MG PO CAPS
500.0000 mg | ORAL_CAPSULE | Freq: Once | ORAL | Status: AC
  Administered 2023-07-05: 500 mg via ORAL
  Filled 2023-07-05: qty 2

## 2023-07-05 MED ORDER — CEPHALEXIN 500 MG PO CAPS: 500.0000 mg | ORAL_CAPSULE | Freq: Two times a day (BID) | ORAL | 0 refills | Status: AC

## 2023-07-05 NOTE — ED Provider Notes (Signed)
 Accepted handoff at shift change from [place], PA-C. Please see prior provider note for more detail.   Briefly: Patient is 78 y.o.   DDX: concern for gout, DVT, cellulitis  Plan: Disposition per results of CT angio bifemoral  Physical Exam  BP (!) 162/67 (BP Location: Right Arm)   Pulse 66   Temp 97.8 F (36.6 C) (Oral)   Resp 17   Ht 5' 10 (1.778 m)   Wt 72.6 kg   SpO2 99%   BMI 22.96 kg/m   Physical Exam Vitals and nursing note reviewed.  Constitutional:      General: He is not in acute distress.    Appearance: Normal appearance.  HENT:     Head: Normocephalic and atraumatic.     Mouth/Throat:     Mouth: Mucous membranes are moist.   Eyes:     Conjunctiva/sclera: Conjunctivae normal.     Pupils: Pupils are equal, round, and reactive to light.    Cardiovascular:     Rate and Rhythm: Normal rate and regular rhythm.     Pulses: Normal pulses.  Pulmonary:     Effort: Pulmonary effort is normal.     Breath sounds: Normal breath sounds.  Abdominal:     Palpations: Abdomen is soft.     Tenderness: There is no abdominal tenderness.   Musculoskeletal:        General: Tenderness present. Normal range of motion.     Cervical back: Normal range of motion.     Comments: TTP of right big toe with redness to the medial toe and pad of the toe. Palpable dorsalis pedis pulse. Strength, sensation, and ROM intact.    Skin:    General: Skin is warm and dry.     Findings: No rash.   Neurological:     General: No focal deficit present.     Mental Status: He is alert.   Psychiatric:        Mood and Affect: Mood normal.        Behavior: Behavior normal.     Procedures  Procedures  ED Course / MDM    Medical Decision Making Amount and/or Complexity of Data Reviewed Radiology: ordered.  Risk Prescription drug management.   Patient a handoff from [place], [place].  In brief, patient here with concerns of pain in the right great toe ongoing for the last several days.   Seen at the urgent care today and advised to come the emergency department for evaluation.  At this time, currently pending CT angio imaging of the lower extremities.  Prior history of DVT currently on anticoagulation.  CT imaging is negative for any acute findings.  There is some subcutaneous edema seen from the right mid calf through the foot.  Area of cellulitic type skin is present primarily to the right great toe.  Uric acid obtained from urgent care earlier is slightly elevated which could be indicating possible issues with gout.  Given patient's renal dysfunction, unable to tolerate first-line medications such as NSAIDs.  Will instead initiate treatment with antibiotic therapy as previously recommended by urgent care.  Initial dose of antibiotics given here.  Advised patient to pick up prescription from urgent care provider tomorrow once able to make his way to the pharmacy.  Return precautions discussed.  Otherwise stable this time for outpatient follow-up discharged home.       Stuti Sandin A, PA-C 07/05/23 1837    Garrick Charleston, MD 07/05/23 [phone]

## 2023-07-05 NOTE — ED Triage Notes (Signed)
 Patient arrives ambulatory by POV c/o right great toe infection over the past week. Went to UC and had labs and x-ray completed then sent here for further evaluation.

## 2023-07-05 NOTE — Discharge Instructions (Addendum)
 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  A flareup of your cold agglutinin disease may have been the cause of your stroke in 2023.  I have performed some basic labs today to look for things concerning for cold agglutinin disease such as elevated platelet count, abnormal liver function.  If any of these are abnormal we will notify you as soon as possible with further recommendations for treatment.  I also enclosed information about calcium  pyrophosphate deposition disease, which is the disease I was discussing with you that is similar to gout.  Because of your history of cold agglutinin disease, I believe that this is unlikely.  I would also like for you know that the x-ray of your foot did not reveal any concern for that as well.  Antibiotics are indicated for exacerbations of cold agglutinin disease so I provided you with a prescription for Keflex  that you will take twice daily for the next 5 days.  This is dosed based on your current kidney function.  In contrast to what we originally discussed, steroids are not indicated and will provide you with no meaningful benefit.  Please make an effort to keep your foot as warm as possible, perhaps consider using heat blanket or microwavable heating pad.  Also avoid cold beverages.  Drinking hot beverages will also help (if you can stand it in the summertime heat).  If you continue to have worsening pain and spread of your rash despite these recommendations, please go to the emergency room for further evaluation.  You may require plasma transfusion to relieve her symptoms.  Thank you for visiting [facility] today.
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
## 2023-07-05 NOTE — ED Notes (Signed)
 Pt ambulating to bathroom. Yellow non-slip socks applied.

## 2023-07-05 NOTE — Discharge Instructions (Addendum)
 You are seen in the emergency department today with concerns of foot pain.  Your labs and imaging were thankfully reassuring and I suspect you likely do have cellulitis going on.  Please take the Keflex  that have been prescribed to.  For any concerns of new or worsening symptoms, return to the emergency department.  Otherwise, please follow-up with your primary care prior.

## 2023-07-05 NOTE — ED Provider Triage Note (Signed)
 Emergency Medicine Provider Triage Evaluation Note  Gary [name] , a 78 y.o. male  was evaluated in triage.  Pt complains of pain to the right great toe, seen at UC prior to arrival here but wanted further workup. UC obtained blood work, and they did put him in a prescription of Keflex .  Family member at the bedside is concerned because last and he had a flareup of his autoimmune disease he suffered a stroke this was 2 years ago.  He is not having any weakness, or headaches.  Review of Systems  Positive: Toe pain Negative: Headache, weakness,   Physical Exam  BP (!) 155/81   Pulse (!) 58   Temp 97.7 F (36.5 C)   Resp 16   SpO2 100%  Gen:   Awake, no distress   Resp:  Normal effort  MSK:   Moves extremities without difficulty  Other:    Medical Decision Making  Medically screening exam initiated at 12:22 PM.  Appropriate orders placed.  Gary [name] was informed that the remainder of the evaluation will be completed by another provider, this initial triage assessment does not replace that evaluation, and the importance of remaining in the ED until their evaluation is complete.     Gary Rapley, PA-C 07/05/23 1225

## 2023-07-05 NOTE — ED Triage Notes (Signed)
 Patient c/o right great toe infection x 1 week.  Patient states he has been putting an antibiotic cream on his toe.

## 2023-07-05 NOTE — ED Provider Notes (Signed)
 [place] EMERGENCY DEPARTMENT AT [facility] Provider Note   CSN: [id number] Arrival date & time: 07/05/23  1204     Patient presents with: Foot Pain   Gary [name] is a 78 y.o. male with a history of CVA, DVT, TBI, and and CHF who presents to the ED today for toe pain. Patient reports tenderness to touch and redness of the right big toe for the past 3 days.  Denies any injury or trauma prior to onset of symptoms.  Wife at bedside states that redness has improved since yesterday.  Patient is able tingling on the foot despite pain.  Wife at bedside states that patient had pain of the big toe several years ago that lasted several days and improved the day he had a stroke.  She is concerned due to this past medical history.  She denies any loss sensation, numbness/tingling, or weakness.  No pain to the foot or leg.  Patient was seen at urgent care prior to this and had a reassuring x-ray.  Since patient has a complex medical history she want him to come here for further evaluation if needed.  No additional complaints or concerns at this time.    Prior to Admission medications   Medication Sig Start Date End Date Taking? Authorizing Provider  acetaminophen  (TYLENOL ) 325 MG tablet Take 1-2 tablets (325-650 mg total) by mouth every 4 (four) hours as needed for mild pain. 05/03/21   Setzer, Nena PARAS, PA-C  amLODipine  (NORVASC ) 5 MG tablet Take 1.5 tablets (7.5 mg total) by mouth daily. 05/14/23   Vannie Reche RAMAN, NP  amphetamine -dextroamphetamine  (ADDERALL  XR) 30 MG 24 hr capsule Take 1 capsule (30 mg total) by mouth daily. 05/20/23   Babs Arthea DASEN, MD  amphetamine -dextroamphetamine  (ADDERALL  XR) 30 MG 24 hr capsule Take 1 capsule (30 mg total) by mouth daily. 05/20/23   Babs Arthea DASEN, MD  amphetamine -dextroamphetamine  (ADDERALL ) 20 MG tablet Take 1 tablet (20 mg total) by mouth daily. 05/20/23 05/19/24  Babs Arthea DASEN, MD  amphetamine -dextroamphetamine  (ADDERALL ) 20 MG tablet  Take 1 tablet (20 mg total) by mouth daily. 05/20/23   Babs Arthea DASEN, MD  apixaban  (ELIQUIS ) 5 MG TABS tablet Take 1 tablet (5 mg total) by mouth 2 (two) times daily. 04/24/23   Dorsey, John T IV, MD  aspirin  81 MG EC tablet Take 1 tablet (81 mg total) by mouth daily. Swallow whole. Patient not taking: Reported on 05/20/2023 05/03/21   Setzer, Sandra J, PA-C  azelastine  (ASTELIN ) 0.1 % nasal spray Place 1 spray into both nostrils 2 (two) times daily. Use in each nostril as directed 02/22/21   Kip Ade, NP  b complex vitamins capsule Take 1 capsule by mouth daily.    [provider]  Brivaracetam  (BRIVIACT ) 50 MG TABS Take 50 mg by mouth 2 (two) times daily. Patient not taking: Reported on 05/20/2023 08/27/22   Whitfield Raisin, NP  carvedilol  (COREG ) 3.125 MG tablet Take 1 tablet (3.125 mg total) by mouth 2 (two) times daily with a meal. 03/09/23   Babs Arthea DASEN, MD  cephALEXin  (KEFLEX ) 500 MG capsule Take 1 capsule (500 mg total) by mouth 2 (two) times daily for 5 days. 07/05/23 07/10/23  Joesph Shaver Scales, PA-C  cetirizine  (ZYRTEC ) 10 MG tablet Take 1 tablet (10 mg total) by mouth daily. 02/22/21   Kip Ade, NP  ezetimibe  (ZETIA ) 10 MG tablet Take 1 tablet (10 mg total) by mouth daily. 08/19/22   Lonni Slain, MD  finasteride  (  PROSCAR ) 5 MG tablet Take 1 tablet (5 mg total) by mouth daily. Patient not taking: Reported on 05/20/2023 03/21/21   Kip Ade, NP  hydrALAZINE  (APRESOLINE ) 10 MG tablet Take 1 tablet (10 mg total) by mouth 3 (three) times daily. 08/25/22   Lonni Slain, MD  hydroxyurea  (HYDREA ) 500 MG capsule Take 1 tablet (500 mg) on Mondays, Wednesdays, Fridays and Weekends. Holding on Tuesdays and Thursdays.  May take with food to minimize GI side effects. 04/24/23   Federico Norleen ONEIDA MADISON, MD  isosorbide  mononitrate (IMDUR ) 30 MG 24 hr tablet Take 1 tablet (30 mg total) by mouth daily. Pt needs to keep upcoming appt in Sept for further refills  06/19/23   Lonni Slain, MD  levothyroxine  (SYNTHROID ) 75 MCG tablet Take 1 tablet (75 mcg total) by mouth daily before breakfast. 10/22/22   Babs Arthea ONEIDA, MD  meclizine  (ANTIVERT ) 25 MG tablet Take 1 tablet (25 mg total) by mouth 3 (three) times daily as needed for dizziness. 01/15/23   Mannie Fairy ONEIDA, DO  Multiple Vitamin (MULTIVITAMIN WITH MINERALS) TABS tablet Take 1 tablet by mouth daily.    [provider]  ondansetron  (ZOFRAN ) 4 MG tablet Take 1 tablet (4 mg total) by mouth every 6 (six) hours. 01/15/23   Mannie Fairy ONEIDA, DO  oxyCODONE  (ROXICODONE ) 5 MG immediate release tablet Take 1 tablet (5 mg total) by mouth every 6 (six) hours as needed for up to 10 doses. 04/09/23   Curatolo, Adam, DO  pantoprazole  (PROTONIX ) 40 MG tablet TAKE 1 TABLET(40 MG) BY MOUTH DAILY Patient not taking: Reported on 05/20/2023 06/05/21   Kip Ade, NP  rosuvastatin  (CRESTOR ) 20 MG tablet Take 1 tablet (20 mg total) by mouth daily. 03/09/23   Babs Arthea ONEIDA, MD  VITAMIN D  PO Take 125 mcg by mouth daily. PER WIFE POWDER    [provider]    Allergies: Levofloxacin , Atomoxetine hcl, and Shellfish allergy    Review of Systems  Musculoskeletal:  Positive for arthralgias.  All other systems reviewed and are negative.   Updated Vital Signs BP (!) 164/73   Pulse (!) 59   Temp 97.7 F (36.5 C)   Resp 16   Ht 5' 10 (1.778 m)   Wt 72.6 kg   SpO2 100%   BMI 22.96 kg/m   Physical Exam Vitals and nursing note reviewed.  Constitutional:      General: He is not in acute distress.    Appearance: Normal appearance.  HENT:     Head: Normocephalic and atraumatic.     Mouth/Throat:     Mouth: Mucous membranes are moist.   Eyes:     Conjunctiva/sclera: Conjunctivae normal.     Pupils: Pupils are equal, round, and reactive to light.    Cardiovascular:     Rate and Rhythm: Normal rate and regular rhythm.     Pulses: Normal pulses.  Pulmonary:     Effort: Pulmonary  effort is normal.     Breath sounds: Normal breath sounds.  Abdominal:     Palpations: Abdomen is soft.     Tenderness: There is no abdominal tenderness.   Musculoskeletal:        General: Tenderness present. Normal range of motion.     Cervical back: Normal range of motion.     Comments: TTP of right big toe with redness to the medial toe and pad of the toe. Palpable dorsalis pedis pulse. Strength, sensation, and ROM intact.    Skin:  General: Skin is warm and dry.     Findings: No rash.   Neurological:     General: No focal deficit present.     Mental Status: He is alert.   Psychiatric:        Mood and Affect: Mood normal.        Behavior: Behavior normal.     (all labs ordered are listed, but only abnormal results are displayed) Labs Reviewed  CBC WITH DIFFERENTIAL/PLATELET  COMPREHENSIVE METABOLIC PANEL WITH GFR    EKG: None  Radiology: DG Foot Complete Right Result Date: 07/05/2023 CLINICAL DATA:  78 year old male with swelling, erythema and pain for 1 week. Query right great toe infection. EXAM: RIGHT FOOT COMPLETE - 3+ VIEW COMPARISON:  None Available. FINDINGS: Three views. Bone mineralization is within normal limits for age. Joint spaces and alignment are within normal limits for age. No acute osseous abnormality identified. No soft tissue gas. Only mild soft tissue swelling is evident at the right great toe. Mild for age calcaneus degenerative spurring. IMPRESSION: No soft tissue gas or acute osseous abnormality identified in the right foot. Electronically Signed   By: VEAR Hurst M.D.   On: 07/05/2023 11:27     Procedures   Medications Ordered in the ED  sodium chloride  0.9 % bolus 500 mL (500 mLs Intravenous New Bag/Given 07/05/23 1454)                                    Medical Decision Making Amount and/or Complexity of Data Reviewed Radiology: ordered.   This patient presents to the ED for concern of toe pain, this involves an extensive number of  treatment options, and is a complaint that carries with it a high risk of complications and morbidity.   Differential diagnosis includes: cellulitis,    Comorbidities  See HPI above   Additional History  Additional history obtained from prior urgent care notes and ED notes.   Lab Tests  I ordered and personally interpreted labs.  The pertinent results include:   Labs pending at shift change.   Imaging Studies  I ordered imaging studies including CTA abdomen/pelvis with run-off.  I independently visualized and interpreted imaging which showed:  Pending at shift change. I agree with the radiologist interpretation   Consultations  I requested consultation with Dr. 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  Advised adding CTA abdomen pelvis with runoff to evaluate for microemboli   Problem List / ED Course / Critical Interventions / Medication Management  Patient reports 3 days of right big toe tenderness to touch and redness.  Denies any associated fevers.  Patient is still able to ambulate on foot despite pain. [name] at bedside states that patient had toe pain several years ago that resolved the day he had a stroke.  She is concerned that his toe pain could be something more insidious. Redness has improved since last night according to wife as well. Declined need for pain medication at this time. Patient staffed with my attending, Dr. Freddi, who also evaluated patient. I have reviewed the patients home medicines and have made adjustments as needed   Social Determinants of Health  Housing    Test / Admission - Considered  Patient care signed out to oncoming provider at  shift change. Disposition pending imaging results.    Final diagnoses:  None    ED Discharge Orders     None           Waddell Sluder, PA-C 07/05/23 1517    Freddi Hamilton, MD 07/06/23 [phone]

## 2023-07-06 ENCOUNTER — Ambulatory Visit (HOSPITAL_COMMUNITY): Payer: Self-pay

## 2023-07-16 ENCOUNTER — Telehealth: Payer: Self-pay | Admitting: Hematology and Oncology

## 2023-07-16 ENCOUNTER — Other Ambulatory Visit: Payer: Self-pay | Admitting: Hematology and Oncology

## 2023-07-16 ENCOUNTER — Inpatient Hospital Stay

## 2023-07-16 ENCOUNTER — Inpatient Hospital Stay: Admitting: Hematology and Oncology

## 2023-07-16 DIAGNOSIS — D473 Essential (hemorrhagic) thrombocythemia: Secondary | ICD-10-CM

## 2023-07-16 NOTE — Progress Notes (Signed)
 Rescheduled

## 2023-07-19 NOTE — Progress Notes (Deleted)
 [facility] [phone]   [phone]  PROGRESS NOTE  Patient Care Team: Gary Erminio CROME, MD as PCP - General (Family Medicine) Gary Slain, MD as PCP - Cardiology (Cardiology) Gary [name] (Family Medicine) Gary Slain, MD as Consulting Physician (Cardiology)  Hematological/Oncological History # Essential Thrombocytosis, JAK2 Positive  05/31/2021: establish care with Dr. Federico for thrombocytosis. Found to be JAK2 positive.  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  The differential diagnosis includes essential thrombocythemia and early phase of primary myelofibrosis. 09/10/2021: start hydroxyurea  500 mg QOD. Was to start on 8/4 but patient was hesitant.  10/15/2021: WBC 7.7, Hgb 16.0, MCV 85.5, Plt 404 12/10/2021: WBC 6.1, Hgb 15.6, MCV 90.5, Plt 351 07/02/2022: WBC 6.8, Hgb 15.6, MCV 88.7, Plt 455K.  07/30/2022: WBC 5.8, Hgb 15.5, MCV 87.7, Plt 491K. Increased dose of Hydroxyurea  500 mg once a day on Mondays, Wednesday, Fridays and Weekends. Holding Tuesdays and Thursdays 09/03/2022: WBC 6.1, Hgb 14.6, MCV 87.9, Plt 312K  Interval History:  Gary [name] 78 y.o. male with medical history significant for essential thrombocytosis, JAK2 positive who presents for a follow up visit. The patient's last visit was on 04/24/2023. In the interim since the last visit he has continued hydroxyurea  therapy.  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   Trauma      SURGICAL HISTORY: Past Surgical History:  Procedure Laterality Date   LOOP RECORDER INSERTION N/A 04/26/2021   Procedure: LOOP RECORDER INSERTION;  Surgeon: Gary Headland, MD;  Location: [facility];  Service: Cardiovascular;  Laterality: N/A;   MINOR REMOVAL OF MANDIBULAR HARDWARE N/A 12/15/2019   Procedure: REMOVAL OF RIGHT LATERAL ORBITAL MINIPLATE;  Surgeon: Gary Estefana RAMAN, DO;  Location: [facility] OR;  Service: Plastics;  Laterality: N/A;   ORIF MANDIBULAR FRACTURE Bilateral 07/27/2019   Procedure: OPEN REDUCTION INTERNAL FIXATION (ORIF) OF COMPLEX ZYGOMATIC FRACTURE;  Surgeon: Gary Estefana RAMAN, DO;  Location: [facility] OR;  Service: Plastics;  Laterality: Bilateral;  2 hours, please    SOCIAL HISTORY: Social History   Socioeconomic History   Marital status: Married    Spouse name: Gary [name]   Number of children: Not on file   Years of education: Not on file   Highest education level: Not on file  Occupational History   Occupation: Quarry manager    Comment: [employer]  Tobacco Use   Smoking status: Former    Types: Cigarettes   Smokeless tobacco: Never  Vaping Use   Vaping status: Never Used  Substance and Sexual Activity   Alcohol use: Not Currently   Drug use: Never   Sexual activity: Yes  Other Topics Concern   Not on file  Social History Narrative   ** Merged History Encounter **       Social Drivers of Health   Financial Resource Strain: Medium Risk (04/10/2023)   Received from [facility]   Overall Financial Resource Strain (CARDIA)    Difficulty of  Paying Living Expenses: Somewhat hard  Food Insecurity: No Food Insecurity (04/10/2023)   Received from [facility]   Hunger Vital Sign    Within the past 12 months, you worried that your food would run out before you got the money to buy more.: Never true    Within the past 12 months, the food you bought just didn't last and you didn't have money to get more.: Never true  Transportation  Needs: No Transportation Needs (04/10/2023)   Received from [facility] - Transportation    Lack of Transportation (Medical): No    Lack of Transportation (Non-Medical): No  Physical Activity: Unknown (02/19/2022)   Received from [facility]   Exercise Vital Sign    On average, how many days per week do you engage in moderate to strenuous exercise (like a brisk walk)?: 0 days    Minutes of Exercise per Session: Not on file  Stress: Stress Concern Present (02/19/2022)   Received from [facility] - Occupational Stress Questionnaire    Feeling of Stress : To some extent  Social Connections: Somewhat Isolated (02/19/2022)   Received from [facility]   Social Network    How would you rate your social network (family, work, friends)?: Restricted participation with some degree of social isolation  Intimate Partner Violence: Not At Risk (02/19/2022)   Received from [facility]   HITS    Over the last 12 months how often did your partner physically hurt you?: Never    Over the last 12 months how often did your partner insult you or talk down to you?: Never    Over the last 12 months how often did your partner threaten you with physical harm?: Never    Over the last 12 months how often did your partner scream or curse at you?: Rarely    FAMILY HISTORY: Family History  Problem Relation Age of Onset   Heart disease Mother    Hypertension Mother    Stroke Mother    Polycythemia Father    Polycythemia Brother    Leukemia Brother     ALLERGIES:  is allergic to levofloxacin , atomoxetine hcl, and shellfish allergy.  MEDICATIONS:  Current Outpatient Medications  Medication Sig Dispense Refill   acetaminophen  (TYLENOL ) 325 MG tablet Take 1-2 tablets (325-650 mg total) by mouth every 4 (four) hours as needed for mild pain.     amLODipine  (NORVASC ) 5 MG tablet Take 1.5 tablets (7.5 mg total) by mouth daily. 135 tablet 3    amphetamine -dextroamphetamine  (ADDERALL  XR) 30 MG 24 hr capsule Take 1 capsule (30 mg total) by mouth daily. 30 capsule 0   amphetamine -dextroamphetamine  (ADDERALL  XR) 30 MG 24 hr capsule Take 1 capsule (30 mg total) by mouth daily. 30 capsule 0   amphetamine -dextroamphetamine  (ADDERALL ) 20 MG tablet Take 1 tablet (20 mg total) by mouth daily. 30 tablet 0   amphetamine -dextroamphetamine  (ADDERALL ) 20 MG tablet Take 1 tablet (20 mg total) by mouth daily. 30 tablet 0   apixaban  (ELIQUIS ) 5 MG TABS tablet Take 1 tablet (5 mg total) by mouth 2 (two) times daily. 180 tablet 1   aspirin  81 MG EC tablet Take 1 tablet (81 mg total) by mouth daily. Swallow whole. (Patient not taking: Reported on 05/20/2023) 30 tablet 0   azelastine  (ASTELIN ) 0.1 % nasal spray Place 1 spray into both nostrils 2 (two) times daily. Use in each nostril as directed 30 mL 12   b  complex vitamins capsule Take 1 capsule by mouth daily.     Brivaracetam  (BRIVIACT ) 50 MG TABS Take 50 mg by mouth 2 (two) times daily. (Patient not taking: Reported on 05/20/2023) 60 tablet 5   carvedilol  (COREG ) 3.125 MG tablet Take 1 tablet (3.125 mg total) by mouth 2 (two) times daily with a meal. 60 tablet 6   cetirizine  (ZYRTEC ) 10 MG tablet Take 1 tablet (10 mg total) by mouth daily. 30 tablet 11   ezetimibe  (ZETIA ) 10 MG tablet Take 1 tablet (10 mg total) by mouth daily. 90 tablet 1   finasteride  (PROSCAR ) 5 MG tablet Take 1 tablet (5 mg total) by mouth daily. (Patient not taking: Reported on 05/20/2023) 30 tablet 0   hydrALAZINE  (APRESOLINE ) 10 MG tablet Take 1 tablet (10 mg total) by mouth 3 (three) times daily. 270 tablet 3   hydroxyurea  (HYDREA ) 500 MG capsule Take 1 tablet (500 mg) on Mondays, Wednesdays, Fridays and Weekends. Holding on Tuesdays and Thursdays.  May take with food to minimize GI side effects. 90 capsule 1   isosorbide  mononitrate (IMDUR ) 30 MG 24 hr tablet Take 1 tablet (30 mg total) by mouth daily. Pt needs to keep upcoming appt  in Sept for further refills 90 tablet 0   levothyroxine  (SYNTHROID ) 75 MCG tablet Take 1 tablet (75 mcg total) by mouth daily before breakfast. 30 tablet 4   meclizine  (ANTIVERT ) 25 MG tablet Take 1 tablet (25 mg total) by mouth 3 (three) times daily as needed for dizziness. 30 tablet 0   Multiple Vitamin (MULTIVITAMIN WITH MINERALS) TABS tablet Take 1 tablet by mouth daily.     ondansetron  (ZOFRAN ) 4 MG tablet Take 1 tablet (4 mg total) by mouth every 6 (six) hours. 12 tablet 0   oxyCODONE  (ROXICODONE ) 5 MG immediate release tablet Take 1 tablet (5 mg total) by mouth every 6 (six) hours as needed for up to 10 doses. 10 tablet 0   pantoprazole  (PROTONIX ) 40 MG tablet TAKE 1 TABLET(40 MG) BY MOUTH DAILY (Patient not taking: Reported on 05/20/2023) 90 tablet 3   rosuvastatin  (CRESTOR ) 20 MG tablet Take 1 tablet (20 mg total) by mouth daily. 30 tablet 6   VITAMIN D  PO Take 125 mcg by mouth daily. PER WIFE POWDER     No current facility-administered medications for this visit.    REVIEW OF SYSTEMS:   Constitutional: ( - ) fevers, ( - )  chills , ( - ) night sweats Eyes: ( - ) blurriness of vision, ( - ) double vision, ( - ) watery eyes Ears, nose, mouth, throat, and face: ( - ) mucositis, ( - ) sore throat Respiratory: ( - ) cough, ( - ) dyspnea, ( - ) wheezes Cardiovascular: ( - ) palpitation, ( - ) chest discomfort, ( - ) lower extremity swelling Gastrointestinal:  ( - ) nausea, ( - ) heartburn, ( - ) change in bowel habits Skin: ( - ) abnormal skin rashes Lymphatics: ( - ) new lymphadenopathy, ( - ) easy bruising Neurological: ( - ) numbness, ( - ) tingling, ( - ) new weaknesses Behavioral/Psych: ( - ) mood change, ( - ) new changes  All other systems were reviewed with the patient and are negative.  PHYSICAL EXAMINATION:  There were no vitals filed for this visit.     There were no vitals filed for this visit.      GENERAL: Well-appearing elderly Caucasian male, alert, no  distress and comfortable SKIN: skin color,  texture, turgor are normal, no rashes or significant lesions EYES: conjunctiva are pink and non-injected, sclera clear LUNGS: clear to auscultation and percussion with normal breathing effort HEART: regular rate & rhythm and no murmurs and no lower extremity edema Musculoskeletal: no cyanosis of digits and no clubbing  PSYCH: alert & oriented x 3, fluent speech NEURO: no focal motor/sensory deficits  LABORATORY DATA:  I have reviewed the data as listed    Latest Ref Rng & Units 07/05/2023   12:29 PM 07/05/2023   12:26 PM 05/08/2023   12:51 PM  CBC  WBC 4.0 - 10.5 K/uL 6.7  6.3  7.2   Hemoglobin 13.0 - 17.0 g/dL 86.6  85.9  85.9   Hematocrit 39.0 - 52.0 % 39.7  37.4  42.5   Platelets 150 - 400 K/uL 423  480  437        Latest Ref Rng & Units 07/05/2023   12:29 PM 07/05/2023   12:26 PM 05/01/2023    6:18 PM  CMP  Glucose 70 - 99 mg/dL 93  69  91   BUN 8 - 23 mg/dL 24  24  23    Creatinine 0.61 - 1.24 mg/dL 8.07  7.97  8.24   Sodium 135 - 145 mmol/L 142  143  141   Potassium 3.5 - 5.1 mmol/L 4.6  4.9  4.6   Chloride 98 - 111 mmol/L 110  109  105   CO2 22 - 32 mmol/L 22  22  25    Calcium  8.9 - 10.3 mg/dL 9.0  9.2  9.8   Total Protein 6.5 - 8.1 g/dL 6.3  6.9    Total Bilirubin 0.0 - 1.2 mg/dL 0.9  0.8    Alkaline Phos 38 - 126 U/L 57  62    AST 15 - 41 U/L 29  36    ALT 0 - 44 U/L 14  18      RADIOGRAPHIC STUDIES: CT ANGIO AO+BIFEM W & OR WO CONTRAST Result Date: 07/05/2023 CLINICAL DATA:  Provided history: Lower extremity vasculitis, known or suspected EXAM: CT ANGIOGRAPHY OF ABDOMINAL AORTA WITH ILIOFEMORAL RUNOFF TECHNIQUE: Multidetector CT imaging of the abdomen, pelvis and lower extremities was performed using the standard protocol during bolus administration of intravenous contrast. Multiplanar CT image reconstructions and MIPs were obtained to evaluate the vascular anatomy. RADIATION DOSE REDUCTION: This exam was performed according  to the departmental dose-optimization program which includes automated exposure control, adjustment of the mA and/or kV according to patient size and/or use of iterative reconstruction technique. CONTRAST:  OMNIPAQUE  IOHEXOL  350 MG/ML SOLN COMPARISON:  None Available. FINDINGS: VASCULAR Aorta: Normal caliber aorta without aneurysm, dissection, vasculitis or significant stenosis. Moderate calcified atheromatous plaque. Celiac: Patent without evidence of aneurysm, dissection, vasculitis or significant stenosis. SMA: Patent without evidence of aneurysm, dissection, vasculitis or significant stenosis. Renals: Both renal arteries are patent without evidence of aneurysm, dissection, vasculitis, fibromuscular dysplasia or significant stenosis. IMA: Patent without evidence of aneurysm, dissection, vasculitis or significant stenosis. RIGHT Lower Extremity Inflow: Common, internal and external iliac arteries are patent without evidence of aneurysm, dissection, vasculitis or significant stenosis. Mild calcified plaque in the common iliac artery. Outflow: Common, superficial and profunda femoral arteries and the popliteal artery are patent without evidence of aneurysm, dissection, vasculitis or significant stenosis. Minimal calcified plaque in the distal femoral artery. Runoff: Patent three vessel runoff to the ankle. LEFT Lower Extremity Inflow: Common, internal and external iliac arteries are patent without evidence of aneurysm, dissection, vasculitis  or significant stenosis. Mild calcified plaque in the common and internal iliac artery. Outflow: Common, superficial and profunda femoral arteries and the popliteal artery are patent without evidence of aneurysm, dissection, vasculitis or significant stenosis. Minimal calcified plaque in the distal femoral artery. Runoff: There is 2 vessel runoff to the ankle. The anterior tibial artery is small in caliber with thready flow, intermittently occluded with some distal  reconstitution at the ankle. Veins: No obvious venous abnormality within the limitations of this arterial phase study. Review of the MIP images confirms the above findings. NON-VASCULAR Lower chest: The heart is mildly enlarged. No focal airspace disease or pleural effusion. Hepatobiliary: No focal liver abnormality on this arterial phase exam. Gallbladder physiologically distended, no calcified stone. No biliary dilatation. Pancreas: No ductal dilatation or inflammation. Spleen: Upper normal in size.  Normal arterial enhancement. Adrenals/Urinary Tract: Normal adrenal glands. Mild right renal atrophy. No hydronephrosis. Bilateral renal cysts. No further follow-up imaging is recommended. Partially distended urinary bladder, normal for degree of distension. Stomach/Bowel: Small hiatal hernia containing fluid. No abnormal gastric distension. There is no small bowel inflammation. No obstruction. Colonic diverticulosis. No diverticulitis. The appendix is not confidently visualized. Lymphatic: No enlarged lymph nodes in the abdomen or pelvis. Reproductive: The prostate is enlarged spanning 6.2 cm transverse. Other: No free air or ascites. Small fat containing left inguinal hernia. Musculoskeletal: Mild subcutaneous edema of the distal right calf extending into the ankle and foot. No soft tissue gas. No radiopaque foreign body. 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 No erosive change or periostitis. Rounded density within the posteromedial soft tissues of the left lower leg measuring 1.9 x 1.4 cm, series 13, image 106, is of unknown etiology. No surrounding inflammation. IMPRESSION: VASCULAR 1. No acute arterial findings. 2. Three-vessel runoff to the right foot. Two vessel runoff to the left foot. 3. Mild calcified plaque in the abdominal aorta and lower extremity vasculature. Aortic Atherosclerosis (ICD10-I70.0). NON-VASCULAR 1. Subcutaneous edema in the  right lower extremity from the mid calf through the foot. No soft tissue gas or radiopaque foreign body. No evidence of osteomyelitis on this wide field of view exam. 2. Colonic diverticulosis without diverticulitis. 3. Enlarged prostate gland. 4. Rounded soft tissue density in the posteromedial soft tissues of the left lower leg is of uncertain etiology. Recommend correlation with physical exam. Electronically Signed   By: Andrea Gasman M.D.   On: 07/05/2023 17:42   DG Foot Complete Right Result Date: 07/05/2023 CLINICAL DATA:  78 year old male with swelling, erythema and pain for 1 week. Query right great toe infection. EXAM: RIGHT FOOT COMPLETE - 3+ VIEW COMPARISON:  None Available. FINDINGS: Three views. Bone mineralization is within normal limits for age. Joint spaces and alignment are within normal limits for age. No acute osseous abnormality identified. No soft tissue gas. Only mild soft tissue swelling is evident at the right great toe. Mild for age calcaneus degenerative spurring. IMPRESSION: No soft tissue gas or acute osseous abnormality identified in the right foot. Electronically Signed   By: VEAR Hurst M.D.   On: 07/05/2023 11:27   CUP PACEART REMOTE DEVICE CHECK Result Date: 07/02/2023 ILR summary report received. Battery status OK. Normal device function. No new symptom, tachy, brady, or pause episodes. No new AF episodes. Monthly summary reports and ROV/PRN ML, CVRS   ASSESSMENT & PLAN Nikkolas Coomes is a 78 y.o. male with medical history significant for essential thrombocytosis, JAK2 positive who presents for a follow  up visit.   # Essential Thrombocytosis, JAK2 Positive  --Diagnosis confirmed by bone marrow biopsy on 08/05/2021. --target Plt count <400.  Patient currently above target at 448. First time he has been above target in almost a year  --currently on Hydroxyurea  500 mg once a day on Mondays, Wednesday, Fridays and Weekends. Holding Tuesdays and Thursdays --Labs today  show WBC *** --Continue with Hydroxyurea  dosed as noted above. Continue ASA 81 mg PO daily --RTC in 12 weeks for labs/follow up and interval labs in 2 weeks to reassess blood counts, which were markedly aberrant at [facility] 2 weeks ago, but normal on our testing today   No orders of the defined types were placed in this encounter.   All questions were answered. The patient knows to call the clinic with any problems, questions or concerns.  A total of more than 30 minutes were spent on this encounter with face-to-face time and non-face-to-face time, including preparing to see the patient, ordering tests and/or medications, counseling the patient and coordination of care as outlined above.   Norleen IVAR Kidney, MD Department of Hematology/Oncology [facility] at [facility] Phone: [phone] Pager: [phone] Email: [email or web address]    07/19/2023 5:51 PM

## 2023-07-21 ENCOUNTER — Other Ambulatory Visit (HOSPITAL_BASED_OUTPATIENT_CLINIC_OR_DEPARTMENT_OTHER): Payer: Self-pay

## 2023-07-22 NOTE — Progress Notes (Signed)
 [facility] Summary Report / Loop Recorder

## 2023-07-28 ENCOUNTER — Inpatient Hospital Stay: Attending: Hematology and Oncology

## 2023-07-28 ENCOUNTER — Telehealth: Payer: Self-pay | Admitting: Hematology and Oncology

## 2023-07-28 ENCOUNTER — Inpatient Hospital Stay: Admitting: Hematology and Oncology

## 2023-08-02 ENCOUNTER — Other Ambulatory Visit: Payer: Self-pay | Admitting: Physical Medicine & Rehabilitation

## 2023-08-02 DIAGNOSIS — I7300 Raynaud's syndrome without gangrene: Secondary | ICD-10-CM

## 2023-08-02 DIAGNOSIS — F329 Major depressive disorder, single episode, unspecified: Secondary | ICD-10-CM

## 2023-08-03 ENCOUNTER — Ambulatory Visit (INDEPENDENT_AMBULATORY_CARE_PROVIDER_SITE_OTHER)

## 2023-08-03 DIAGNOSIS — I429 Cardiomyopathy, unspecified: Secondary | ICD-10-CM

## 2023-08-03 LAB — CUP PACEART REMOTE DEVICE CHECK
Date Time Interrogation Session: 20250727232429
Implantable Pulse Generator Implant Date: 20230421

## 2023-08-05 ENCOUNTER — Ambulatory Visit: Payer: Self-pay | Admitting: Cardiovascular Disease

## 2023-08-12 ENCOUNTER — Telehealth: Payer: Self-pay | Admitting: Adult Health
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
## 2023-08-12 NOTE — Telephone Encounter (Signed)
 [facility] conf

## 2023-08-13 ENCOUNTER — Ambulatory Visit: Admitting: Adult Health

## 2023-08-13 ENCOUNTER — Encounter: Payer: Self-pay | Admitting: Adult Health
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
## 2023-08-17 ENCOUNTER — Inpatient Hospital Stay (HOSPITAL_BASED_OUTPATIENT_CLINIC_OR_DEPARTMENT_OTHER): Admitting: Hematology and Oncology

## 2023-08-17 ENCOUNTER — Inpatient Hospital Stay: Attending: Hematology and Oncology

## 2023-08-17 VITALS — BP 150/58 | HR 63 | Temp 97.5°F | Resp 16 | Ht 70.0 in | Wt 159.3 lb

## 2023-08-17 DIAGNOSIS — D75839 Thrombocytosis, unspecified: Secondary | ICD-10-CM

## 2023-08-17 DIAGNOSIS — Z7982 Long term (current) use of aspirin: Secondary | ICD-10-CM | POA: Insufficient documentation

## 2023-08-17 DIAGNOSIS — Z1589 Genetic susceptibility to other disease: Secondary | ICD-10-CM

## 2023-08-17 DIAGNOSIS — D473 Essential (hemorrhagic) thrombocythemia: Secondary | ICD-10-CM | POA: Insufficient documentation

## 2023-08-17 LAB — CMP (CANCER CENTER ONLY)
ALT: 12 U/L (ref 0–44)
AST: 22 U/L (ref 15–41)
Albumin: 3.9 g/dL (ref 3.5–5.0)
Alkaline Phosphatase: 70 U/L (ref 38–126)
Anion gap: 6 (ref 5–15)
BUN: 23 mg/dL (ref 8–23)
CO2: 26 mmol/L (ref 22–32)
Calcium: 9.1 mg/dL (ref 8.9–10.3)
Chloride: 112 mmol/L — ABNORMAL HIGH (ref 98–111)
Creatinine: 1.94 mg/dL — ABNORMAL HIGH (ref 0.61–1.24)
GFR, Estimated: 35 mL/min — ABNORMAL LOW
Glucose, Bld: 69 mg/dL — ABNORMAL LOW (ref 70–99)
Potassium: 4 mmol/L (ref 3.5–5.1)
Sodium: 144 mmol/L (ref 135–145)
Total Bilirubin: 0.4 mg/dL (ref 0.0–1.2)
Total Protein: 7 g/dL (ref 6.5–8.1)

## 2023-08-17 LAB — CBC WITH DIFFERENTIAL (CANCER CENTER ONLY)
Abs Immature Granulocytes: 0.07 10*3/uL (ref 0.00–0.07)
Basophils Absolute: 0.1 10*3/uL (ref 0.0–0.1)
Basophils Relative: 1 %
Eosinophils Absolute: 0.2 10*3/uL (ref 0.0–0.5)
Eosinophils Relative: 3 %
HCT: 39.1 % (ref 39.0–52.0)
Hemoglobin: 14 g/dL (ref 13.0–17.0)
Immature Granulocytes: 1 %
Lymphocytes Relative: 11 %
Lymphs Abs: 0.9 10*3/uL (ref 0.7–4.0)
MCH: 33.8 pg (ref 26.0–34.0)
MCHC: 35.8 g/dL (ref 30.0–36.0)
MCV: 94.4 fL (ref 80.0–100.0)
Monocytes Absolute: 0.7 10*3/uL (ref 0.1–1.0)
Monocytes Relative: 9 %
Neutro Abs: 6.7 10*3/uL (ref 1.7–7.7)
Neutrophils Relative %: 75 %
Platelet Count: 468 10*3/uL — ABNORMAL HIGH (ref 150–400)
RBC: 4.14 MIL/uL — ABNORMAL LOW (ref 4.22–5.81)
RDW: 17.5 % — ABNORMAL HIGH (ref 11.5–15.5)
WBC Count: 8.7 10*3/uL (ref 4.0–10.5)
nRBC: 0 % (ref 0.0–0.2)

## 2023-08-17 MED ORDER — HYDROXYUREA 500 MG PO CAPS
ORAL_CAPSULE | ORAL | 1 refills | Status: AC
Start: 2023-08-17 — End: ?

## 2023-09-01 ENCOUNTER — Other Ambulatory Visit (HOSPITAL_BASED_OUTPATIENT_CLINIC_OR_DEPARTMENT_OTHER): Payer: Self-pay | Admitting: Cardiology

## 2023-09-01 DIAGNOSIS — R5382 Chronic fatigue, unspecified: Secondary | ICD-10-CM

## 2023-09-01 DIAGNOSIS — I429 Cardiomyopathy, unspecified: Secondary | ICD-10-CM

## 2023-09-01 DIAGNOSIS — I5189 Other ill-defined heart diseases: Secondary | ICD-10-CM

## 2023-09-02 ENCOUNTER — Other Ambulatory Visit: Payer: Self-pay

## 2023-09-02 DIAGNOSIS — I429 Cardiomyopathy, unspecified: Secondary | ICD-10-CM

## 2023-09-02 DIAGNOSIS — R5382 Chronic fatigue, unspecified: Secondary | ICD-10-CM

## 2023-09-02 DIAGNOSIS — I5189 Other ill-defined heart diseases: Secondary | ICD-10-CM

## 2023-09-02 MED ORDER — HYDRALAZINE HCL 10 MG PO TABS: 10.0000 mg | ORAL_TABLET | Freq: Three times a day (TID) | ORAL | 0 refills | Status: DC

## 2023-09-02 NOTE — Telephone Encounter (Signed)
 [name] pharmacy was requesting a 90 day supply, cheaper with pt's insurance and pt has been compliance with visits. Rx sent for a 90 day supply, asking pt to keep upcoming appt in September 2025 with Dr. Lonni. Confirmation received.

## 2023-09-03 ENCOUNTER — Ambulatory Visit: Payer: Self-pay | Admitting: Cardiovascular Disease

## 2023-09-03 ENCOUNTER — Ambulatory Visit

## 2023-09-03 DIAGNOSIS — Z8673 Personal history of transient ischemic attack (TIA), and cerebral infarction without residual deficits: Secondary | ICD-10-CM | POA: Diagnosis not present

## 2023-09-03 LAB — CUP PACEART REMOTE DEVICE CHECK
Date Time Interrogation Session: 20250827232150
Implantable Pulse Generator Implant Date: 20230421

## 2023-09-08 ENCOUNTER — Encounter (HOSPITAL_BASED_OUTPATIENT_CLINIC_OR_DEPARTMENT_OTHER): Payer: Self-pay

## 2023-09-09 ENCOUNTER — Encounter (HOSPITAL_BASED_OUTPATIENT_CLINIC_OR_DEPARTMENT_OTHER): Payer: Self-pay | Admitting: Cardiology

## 2023-09-09 ENCOUNTER — Ambulatory Visit (INDEPENDENT_AMBULATORY_CARE_PROVIDER_SITE_OTHER): Admitting: Cardiology

## 2023-09-09 VITALS — BP 130/72 | HR 78 | Resp 17 | Ht 70.0 in | Wt 156.0 lb

## 2023-09-09 DIAGNOSIS — I5189 Other ill-defined heart diseases: Secondary | ICD-10-CM

## 2023-09-09 DIAGNOSIS — L819 Disorder of pigmentation, unspecified: Secondary | ICD-10-CM | POA: Diagnosis not present

## 2023-09-09 DIAGNOSIS — I429 Cardiomyopathy, unspecified: Secondary | ICD-10-CM

## 2023-09-09 DIAGNOSIS — Z8673 Personal history of transient ischemic attack (TIA), and cerebral infarction without residual deficits: Secondary | ICD-10-CM

## 2023-09-09 DIAGNOSIS — N1832 Chronic kidney disease, stage 3b: Secondary | ICD-10-CM

## 2023-09-09 DIAGNOSIS — R5382 Chronic fatigue, unspecified: Secondary | ICD-10-CM | POA: Diagnosis not present

## 2023-09-09 MED ORDER — HYDRALAZINE HCL 10 MG PO TABS: 10.0000 mg | ORAL_TABLET | Freq: Three times a day (TID) | ORAL | 3 refills | Status: AC
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
## 2023-09-09 NOTE — Patient Instructions (Signed)
 Medication Instructions:  Your physician recommends that you continue on your current medications as directed. Please refer to the Current Medication list given to you today.  *If you need a refill on your cardiac medications before your next appointment, please call your pharmacy*  Lab Work: NONE  Testing/Procedures: NONE  Follow-Up: At [facility], you and your health needs are our priority.  As part of our continuing mission to provide you with exceptional heart care, we have created designated Provider Care Teams.  These Care Teams include your primary Cardiologist (physician) and Advanced Practice Providers (APPs -  Physician Assistants and Nurse Practitioners) who all work together to provide you with the care you need, when you need it.  We recommend signing up for the patient portal called MyChart.  Sign up information is provided on this After Visit Summary.  MyChart is used to connect with patients for Virtual Visits (Telemedicine).  Patients are able to view lab/test results, encounter notes, upcoming appointments, etc.  Non-urgent messages can be sent to your provider as well.   To learn more about what you can do with MyChart, go to [email or web address].    Your next appointment:   6 month(s)  The format for your next appointment:   In Person  Provider:   Shelda Bruckner MD, Reche ORN NP, or Rosaline RAMAN NP   Try [facility] for your foot pain

## 2023-09-14 ENCOUNTER — Inpatient Hospital Stay: Attending: Hematology and Oncology

## 2023-09-17 NOTE — Progress Notes (Signed)
 Remote Loop Recorder Transmission

## 2023-09-24 NOTE — Progress Notes (Signed)
 [facility] Summary Report / Loop Recorder

## 2023-10-01 ENCOUNTER — Encounter: Payer: Self-pay | Admitting: Physical Medicine & Rehabilitation

## 2023-10-01 DIAGNOSIS — Z87820 Personal history of traumatic brain injury: Secondary | ICD-10-CM

## 2023-10-01 DIAGNOSIS — F900 Attention-deficit hyperactivity disorder, predominantly inattentive type: Secondary | ICD-10-CM

## 2023-10-02 IMAGING — MR MR PROSTATE WO/W CM
12 series · 48 of 48 positions shown · IV contrast (multihance)
Comparison: None.

CLINICAL DATA: Elevated PSA level of 9.52 on 10/26/2020

EXAM:
MR PROSTATE WITHOUT AND WITH CONTRAST
TECHNIQUE: Multiplanar multisequence MRI images were obtained of the pelvis
centered about the prostate. Pre and post contrast images were
obtained.
CONTRAST:  15mL MULTIHANCE GADOBENATE DIMEGLUMINE 529 MG/ML IV SOLN

[Series 3: T2 · coronal · 3.0mm · 0.56mm/px · 1 of 23 slices shown (1 of 3)]
[im 1/23]
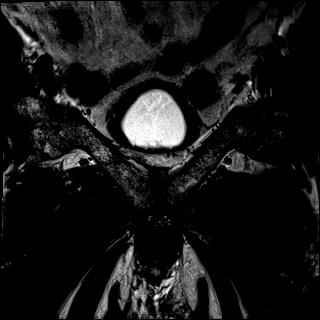

[Series 4: T1 · axial · 5.0mm · 1.25mm/px · z∈[-114,+121]mm · 2 of 96 slices shown]
[im 1/96]
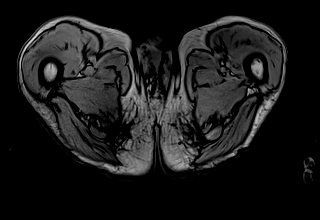
[im 96/96]
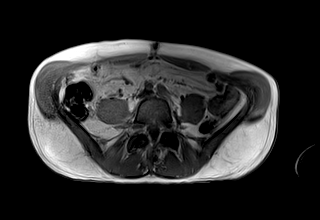

[Series 5: DWI · axial · 3.0mm · 1.75mm/px · 1 of 77 slices shown (1 of 3)]
[im 1/77]
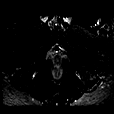

[Series 6: DWI · axial · 3.0mm · 1.75mm/px · 1 of 26 slices shown (2 of 3)]
[im 1/26]
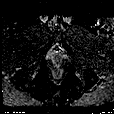

[Series 7: DWI · axial · 3.0mm · 1.75mm/px · 1 of 26 slices shown (3 of 3)]
[im 1/26]
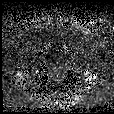

[Series 8: T2 · axial · 3.0mm · 0.56mm/px · 1 of 26 slices shown (2 of 3)]
[im 1/26]
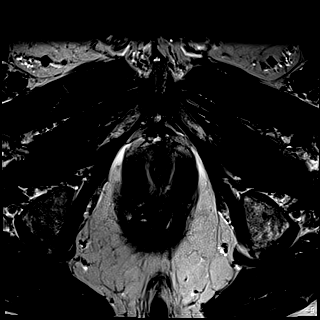

[Series 9: T2 · axial · 1.0mm · 1.04mm/px · z∈[-70,+1]mm · 2 of 72 slices shown (3 of 3)]
[im 1/72]
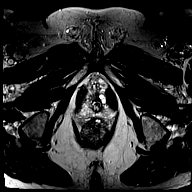
[im 72/72]
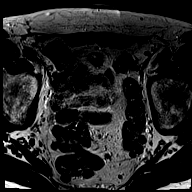

[Series 10: pre t1_twist_tra_dyn · axial · non-contrast · 3.5mm · 0.83mm/px · 1 of 22 slices shown]
[im 1/22]
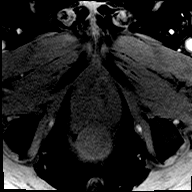

[Series 11: post t1_twist_tra_dyn-copy center · axial · non-contrast · 3.5mm · 0.83mm/px · z∈[-71,+2]mm · 17 of 660 slices shown]
[im 1/660]
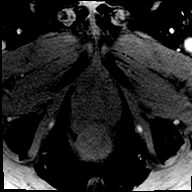
[im 42/660]
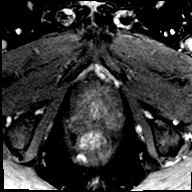
[im 83/660]
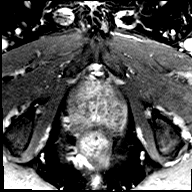
[im 124/660]
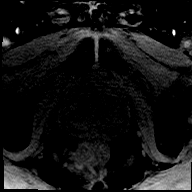
[im 165/660]
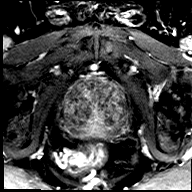
[im 206/660]
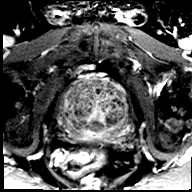
[im 248/660]
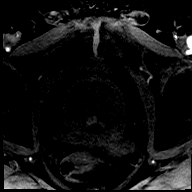
[im 289/660]
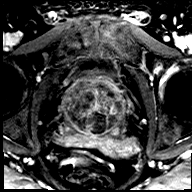
[im 330/660]
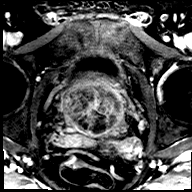
[im 371/660]
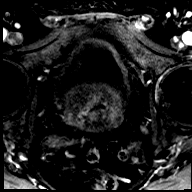
[im 412/660]
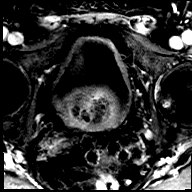
[im 454/660]
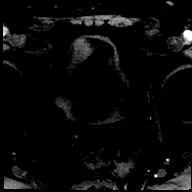
[im 495/660]
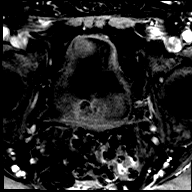
[im 536/660]
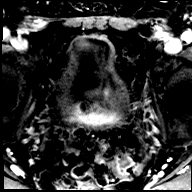
[im 577/660]
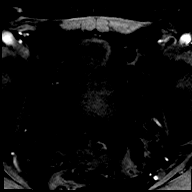
[im 618/660]
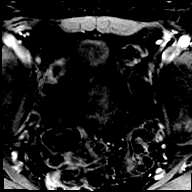
[im 660/660]
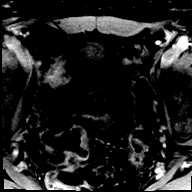

[Series 12: post t1_twist_tra_dyn-copy cent_sub · axial · 3.5mm · 0.83mm/px · z∈[-71,+2]mm · 17 of 638 slices shown]
[im 1/638]
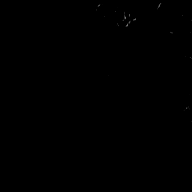
[im 40/638]
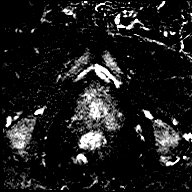
[im 80/638]
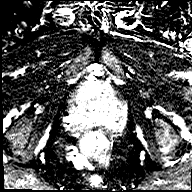
[im 120/638]
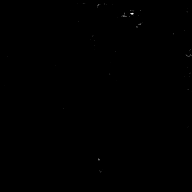
[im 160/638]
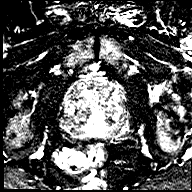
[im 200/638]
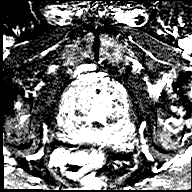
[im 239/638]
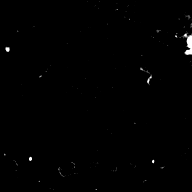
[im 279/638]
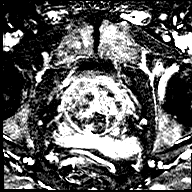
[im 319/638]
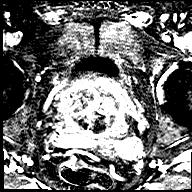
[im 359/638]
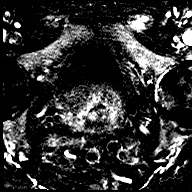
[im 399/638]
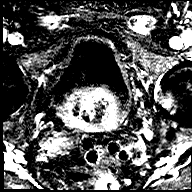
[im 438/638]
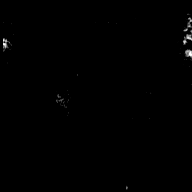
[im 478/638]
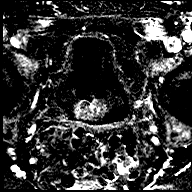
[im 518/638]
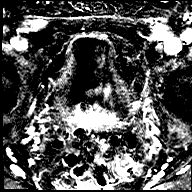
[im 558/638]
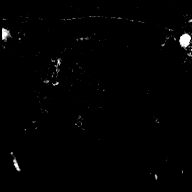
[im 598/638]
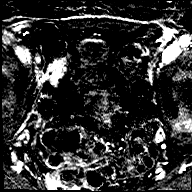
[im 638/638]
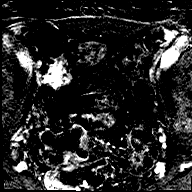

[Series 13: t1_vibe_dixon_tra_f · axial · 2.5mm · 0.91mm/px · z∈[-95,+102]mm · 2 of 80 slices shown]
[im 1/80]
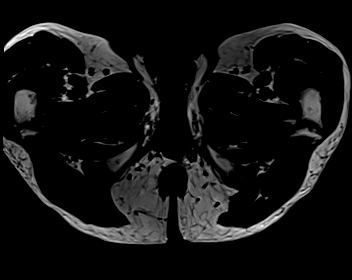
[im 80/80]
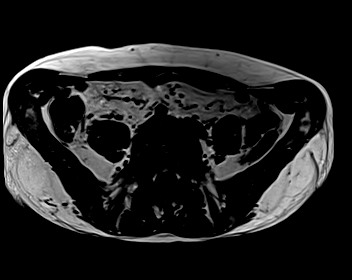

[Series 14: t1_vibe_dixon_tra_w · axial · 2.5mm · 0.91mm/px · z∈[-95,+102]mm · 2 of 80 slices shown]
[im 1/80]
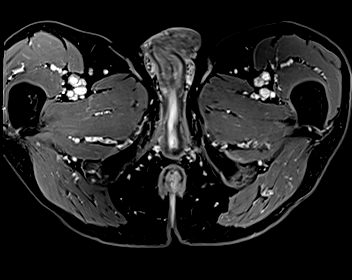
[im 80/80]
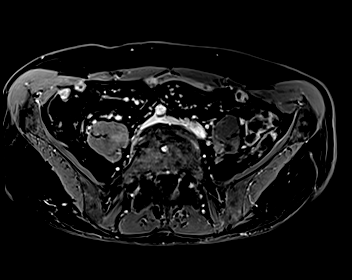

[48 of 48 positions shown; findings below may reference images not displayed]

FINDINGS: Prostate: Encapsulated nodularity in the transition zone compatible
with benign prostatic hypertrophy. The prostate gland indents the
bladder base.

There is hazy low T2 signal intensity stranding in the peripheral
zone bilaterally which is likely postinflammatory and considered
PI-RADS category 2. No specific lesion of intermediate or higher
suspicion for prostate cancer is identified.

Volume: 3D volumetric analysis: Prostate volume 85.67 cc (5.5 by
by 6.1 cm).

Absent

Transcapsular spread:  Absent

Seminal vesicle involvement: Absent

Neurovascular bundle involvement: Absent

Pelvic adenopathy: Absent

Bone metastasis: Absent

Other findings: Sigmoid colon diverticulosis. Degenerative findings
in the hips. Probable hemangioma in the left side of the sacrum on
image 12 series 14, there seems to be some accentuated T1 signal in
this vicinity along with some mild trabecular accentuation on the CT
of 07/21/2019 in this vicinity.
IMPRESSION: 1. No lesions of intermediate or higher suspicion for prostate
cancer identified.
2. Hazy low T2 signal stranding in the peripheral zone bilaterally
is likely postinflammatory and considered PI-RADS category 2.
3. Moderate prostatomegaly and benign prostatic hypertrophy.
4. Sigmoid colon diverticulosis.
5. Degenerative arthropathy of both hips.

## 2023-10-02 MED ORDER — AMPHETAMINE-DEXTROAMPHET ER 30 MG PO CP24: 30.0000 mg | ORAL_CAPSULE | Freq: Every day | ORAL | 0 refills | Status: DC

## 2023-10-02 MED ORDER — AMPHETAMINE-DEXTROAMPHETAMINE 20 MG PO TABS: 20.0000 mg | ORAL_TABLET | Freq: Every day | ORAL | 0 refills | Status: DC

## 2023-10-02 NOTE — Telephone Encounter (Signed)
Rxes sent 

## 2023-10-05 ENCOUNTER — Ambulatory Visit (INDEPENDENT_AMBULATORY_CARE_PROVIDER_SITE_OTHER)

## 2023-10-05 DIAGNOSIS — I5189 Other ill-defined heart diseases: Secondary | ICD-10-CM

## 2023-10-05 LAB — CUP PACEART REMOTE DEVICE CHECK
Date Time Interrogation Session: 20250928232330
Implantable Pulse Generator Implant Date: 20230421

## 2023-10-06 ENCOUNTER — Ambulatory Visit: Payer: Self-pay | Admitting: Cardiovascular Disease

## 2023-10-07 ENCOUNTER — Encounter: Payer: Self-pay | Admitting: *Deleted

## 2023-10-07 ENCOUNTER — Telehealth: Payer: Self-pay | Admitting: *Deleted

## 2023-10-07 NOTE — Progress Notes (Signed)
 Remote Loop Recorder Transmission

## 2023-10-07 NOTE — Telephone Encounter (Signed)
 Fax'd medical release for pt's upcoming eye surgery to [facility] [phone]

## 2023-10-08 NOTE — Progress Notes (Signed)
 Remote Loop Recorder Transmission

## 2023-11-03 ENCOUNTER — Encounter: Payer: Worker's Compensation | Attending: Physical Medicine & Rehabilitation | Admitting: Psychology

## 2023-11-03 DIAGNOSIS — F09 Unspecified mental disorder due to known physiological condition: Secondary | ICD-10-CM

## 2023-11-03 DIAGNOSIS — S069XAS Unspecified intracranial injury with loss of consciousness status unknown, sequela: Secondary | ICD-10-CM

## 2023-11-03 DIAGNOSIS — F329 Major depressive disorder, single episode, unspecified: Secondary | ICD-10-CM | POA: Diagnosis not present

## 2023-11-03 DIAGNOSIS — G44329 Chronic post-traumatic headache, not intractable: Secondary | ICD-10-CM | POA: Diagnosis not present

## 2023-11-03 DIAGNOSIS — G3189 Other specified degenerative diseases of nervous system: Secondary | ICD-10-CM

## 2023-11-03 DIAGNOSIS — F900 Attention-deficit hyperactivity disorder, predominantly inattentive type: Secondary | ICD-10-CM | POA: Diagnosis present

## 2023-11-03 DIAGNOSIS — Z87820 Personal history of traumatic brain injury: Secondary | ICD-10-CM | POA: Diagnosis present

## 2023-11-05 ENCOUNTER — Ambulatory Visit

## 2023-11-05 ENCOUNTER — Ambulatory Visit: Payer: Self-pay | Admitting: Cardiovascular Disease

## 2023-11-05 DIAGNOSIS — I5189 Other ill-defined heart diseases: Secondary | ICD-10-CM | POA: Diagnosis not present

## 2023-11-05 LAB — CUP PACEART REMOTE DEVICE CHECK
Date Time Interrogation Session: 20251029231900
Implantable Pulse Generator Implant Date: 20230421

## 2023-11-08 ENCOUNTER — Other Ambulatory Visit: Payer: Self-pay | Admitting: Hematology and Oncology

## 2023-11-08 DIAGNOSIS — D473 Essential (hemorrhagic) thrombocythemia: Secondary | ICD-10-CM

## 2023-11-08 NOTE — Progress Notes (Signed)
 Rescheduled

## 2023-11-09 ENCOUNTER — Inpatient Hospital Stay: Attending: Hematology and Oncology

## 2023-11-09 ENCOUNTER — Inpatient Hospital Stay: Admitting: Hematology and Oncology

## 2023-11-09 DIAGNOSIS — Z1589 Genetic susceptibility to other disease: Secondary | ICD-10-CM

## 2023-11-09 DIAGNOSIS — D75839 Thrombocytosis, unspecified: Secondary | ICD-10-CM

## 2023-11-09 DIAGNOSIS — D473 Essential (hemorrhagic) thrombocythemia: Secondary | ICD-10-CM

## 2023-11-10 NOTE — Progress Notes (Signed)
 Remote Loop Recorder Transmission

## 2023-11-16 NOTE — Progress Notes (Deleted)
 [facility] [phone]   [phone]  PROGRESS NOTE  Patient Care Team: Gladystine Erminio CROME, MD as PCP - General (Family Medicine) Lonni Slain, MD as PCP - Cardiology (Cardiology) Gladis Elsie JAYSON DEVONNA (Family Medicine) Lonni Slain, MD as Consulting Physician (Cardiology)  Hematological/Oncological History # Essential Thrombocytosis, JAK2 Positive  05/31/2021: establish care with Dr. Federico for thrombocytosis. Found to be JAK2 positive.  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  The differential diagnosis includes essential thrombocythemia and early phase of primary myelofibrosis. 09/10/2021: start hydroxyurea  500 mg QOD. Was to start on 8/4 but patient was hesitant.  10/15/2021: WBC 7.7, Hgb 16.0, MCV 85.5, Plt 404 12/10/2021: WBC 6.1, Hgb 15.6, MCV 90.5, Plt 351 07/02/2022: WBC 6.8, Hgb 15.6, MCV 88.7, Plt 455K.  07/30/2022: WBC 5.8, Hgb 15.5, MCV 87.7, Plt 491K. Increased dose of Hydroxyurea  500 mg once a day on Mondays, Wednesday, Fridays and Weekends. Holding Tuesdays and Thursdays 09/03/2022: WBC 6.1, Hgb 14.6, MCV 87.9, Plt 312K  Interval History:  Gary [name] 78 y.o. male with medical history significant for essential thrombocytosis, JAK2 positive who presents for a follow up visit. The patient's last visit was on 08/17/2023. In the interim since the last visit he has continued hydroxyurea  therapy.  On exam today 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   Trauma      SURGICAL HISTORY: Past Surgical History:  Procedure Laterality Date   LOOP RECORDER INSERTION N/A 04/26/2021   Procedure: LOOP RECORDER INSERTION;  Surgeon: Francyne Headland, MD;  Location: [facility];  Service: Cardiovascular;  Laterality: N/A;   MINOR REMOVAL OF MANDIBULAR HARDWARE N/A 12/15/2019   Procedure: REMOVAL OF RIGHT LATERAL ORBITAL MINIPLATE;  Surgeon: Lowery Estefana RAMAN, DO;  Location: [facility] OR;  Service: Plastics;  Laterality: N/A;   ORIF MANDIBULAR FRACTURE Bilateral 07/27/2019   Procedure: OPEN REDUCTION INTERNAL FIXATION (ORIF) OF COMPLEX ZYGOMATIC FRACTURE;  Surgeon: Lowery Estefana RAMAN, DO;  Location: [facility] OR;  Service: Plastics;  Laterality: Bilateral;  2 hours, please    SOCIAL HISTORY: Social History   Socioeconomic History   Marital status: Married    Spouse name: [name]   Number of children: 2   Years of education: Not on file   Highest education level: Not on file  Occupational History   Occupation: Quarry manager    Comment: [employer]  Tobacco Use   Smoking status: Former    Current packs/day: 0.00    Types: Cigarettes    Quit date: 1971    Years since quitting: 54.8   Smokeless tobacco: Never  Vaping Use   Vaping status: Never Used  Substance and Sexual Activity   Alcohol use: Not Currently   Drug use: Never   Sexual activity: Yes  Other Topics Concern   Not on file  Social History Narrative   ** Merged History Encounter **       Social Drivers of Health   Financial Resource Strain: Medium Risk (04/10/2023)  Received from [employer]   Overall Financial Resource Strain (CARDIA)    Difficulty of Paying Living Expenses: Somewhat hard  Food Insecurity: No Food Insecurity (04/10/2023)   Received from [facility]   Hunger Vital Sign    Within the past 12 months, you worried that your food would run out before you got the money to buy more.: Never true    Within the past 12 months, the food you bought just didn't last and  you didn't have money to get more.: Never true  Transportation Needs: No Transportation Needs (04/10/2023)   Received from [facility] - Transportation    Lack of Transportation (Medical): No    Lack of Transportation (Non-Medical): No  Physical Activity: Unknown (02/19/2022)   Received from [facility]   Exercise Vital Sign    On average, how many days per week do you engage in moderate to strenuous exercise (like a brisk walk)?: 0 days    Minutes of Exercise per Session: Not on file  Stress: Stress Concern Present (02/19/2022)   Received from [facility] - Occupational Stress Questionnaire    Feeling of Stress : To some extent  Social Connections: Somewhat Isolated (02/19/2022)   Received from [facility]   Social Network    How would you rate your social network (family, work, friends)?: Restricted participation with some degree of social isolation  Intimate Partner Violence: Not At Risk (02/19/2022)   Received from [facility]   HITS    Over the last 12 months how often did your partner physically hurt you?: Never    Over the last 12 months how often did your partner insult you or talk down to you?: Never    Over the last 12 months how often did your partner threaten you with physical harm?: Never    Over the last 12 months how often did your partner scream or curse at you?: Rarely    FAMILY HISTORY: Family History  Problem Relation Age of Onset   Heart disease Mother    Hypertension Mother    Stroke Mother    Polycythemia Father    Polycythemia Brother    Leukemia Brother     ALLERGIES:  is allergic to levofloxacin , atomoxetine hcl, and shellfish allergy.  MEDICATIONS:  Current Outpatient Medications  Medication Sig Dispense Refill   acetaminophen  (TYLENOL ) 325 MG tablet Take 1-2 tablets (325-650 mg total) by mouth every 4 (four) hours as needed for mild pain.     amLODipine  (NORVASC ) 5 MG tablet Take 1.5  tablets (7.5 mg total) by mouth daily. 135 tablet 3   amphetamine -dextroamphetamine  (ADDERALL  XR) 30 MG 24 hr capsule Take 1 capsule (30 mg total) by mouth daily. 30 capsule 0   amphetamine -dextroamphetamine  (ADDERALL  XR) 30 MG 24 hr capsule Take 1 capsule (30 mg total) by mouth daily. 30 capsule 0   amphetamine -dextroamphetamine  (ADDERALL ) 20 MG tablet Take 1 tablet (20 mg total) by mouth daily. 30 tablet 0   amphetamine -dextroamphetamine  (ADDERALL ) 20 MG tablet Take 1 tablet (20 mg total) by mouth daily. 30 tablet 0   apixaban  (ELIQUIS ) 5 MG TABS tablet Take 1 tablet (5 mg total) by mouth 2 (two) times daily. 180 tablet 1   azelastine  (ASTELIN ) 0.1 % nasal spray Place 1 spray into both nostrils 2 (two) times daily. Use in each nostril as directed 30 mL 12   b complex vitamins capsule Take 1 capsule by mouth daily.  Brivaracetam  (BRIVIACT ) 50 MG TABS Take 50 mg by mouth 2 (two) times daily. (Patient not taking: Reported on 09/09/2023) 60 tablet 5   carvedilol  (COREG ) 3.125 MG tablet Take 1 tablet (3.125 mg total) by mouth 2 (two) times daily with a meal. 60 tablet 6   cetirizine  (ZYRTEC ) 10 MG tablet Take 1 tablet (10 mg total) by mouth daily. 30 tablet 11   ezetimibe  (ZETIA ) 10 MG tablet Take 1 tablet (10 mg total) by mouth daily. 90 tablet 1   finasteride  (PROSCAR ) 5 MG tablet Take 1 tablet (5 mg total) by mouth daily. (Patient not taking: Reported on 09/09/2023) 30 tablet 0   hydrALAZINE  (APRESOLINE ) 10 MG tablet Take 1 tablet (10 mg total) by mouth 3 (three) times daily. 270 tablet 3   hydroxyurea  (HYDREA ) 500 MG capsule Take 1 tablet (500 mg) on Mondays, Wednesdays, Fridays and Weekends. Holding on Tuesdays and Thursdays.  May take with food to minimize GI side effects. 90 capsule 1   isosorbide  mononitrate (IMDUR ) 30 MG 24 hr tablet Take 1 tablet (30 mg total) by mouth daily. Pt needs to keep upcoming appt in Sept for further refills 90 tablet 0   levothyroxine  (SYNTHROID ) 75 MCG tablet Take 1  tablet (75 mcg total) by mouth daily before breakfast. 30 tablet 4   meclizine  (ANTIVERT ) 25 MG tablet Take 1 tablet (25 mg total) by mouth 3 (three) times daily as needed for dizziness. 30 tablet 0   Multiple Vitamin (MULTIVITAMIN WITH MINERALS) TABS tablet Take 1 tablet by mouth daily.     ondansetron  (ZOFRAN ) 4 MG tablet Take 1 tablet (4 mg total) by mouth every 6 (six) hours. 12 tablet 0   oxyCODONE  (ROXICODONE ) 5 MG immediate release tablet Take 1 tablet (5 mg total) by mouth every 6 (six) hours as needed for up to 10 doses. 10 tablet 0   pantoprazole  (PROTONIX ) 40 MG tablet TAKE 1 TABLET(40 MG) BY MOUTH DAILY 90 tablet 3   rosuvastatin  (CRESTOR ) 20 MG tablet Take 1 tablet (20 mg total) by mouth daily. 30 tablet 6   VITAMIN D  PO Take 125 mcg by mouth daily. PER WIFE POWDER     No current facility-administered medications for this visit.    REVIEW OF SYSTEMS:   Constitutional: ( - ) fevers, ( - )  chills , ( - ) night sweats Eyes: ( - ) blurriness of vision, ( - ) double vision, ( - ) watery eyes Ears, nose, mouth, throat, and face: ( - ) mucositis, ( - ) sore throat Respiratory: ( - ) cough, ( - ) dyspnea, ( - ) wheezes Cardiovascular: ( - ) palpitation, ( - ) chest discomfort, ( - ) lower extremity swelling Gastrointestinal:  ( - ) nausea, ( - ) heartburn, ( - ) change in bowel habits Skin: ( - ) abnormal skin rashes Lymphatics: ( - ) new lymphadenopathy, ( - ) easy bruising Neurological: ( - ) numbness, ( - ) tingling, ( - ) new weaknesses Behavioral/Psych: ( - ) mood change, ( - ) new changes  All other systems were reviewed with the patient and are negative.  PHYSICAL EXAMINATION:  There were no vitals filed for this visit.  There were no vitals filed for this visit.   GENERAL: Well-appearing elderly Caucasian male, alert, no distress and comfortable SKIN: skin color, texture, turgor are normal, no rashes or significant lesions EYES: conjunctiva are pink and non-injected,  sclera clear LUNGS: clear to auscultation and percussion with normal  breathing effort HEART: regular rate & rhythm and no murmurs and no lower extremity edema Musculoskeletal: no cyanosis of digits and no clubbing  PSYCH: alert & oriented x 3, fluent speech NEURO: no focal motor/sensory deficits  LABORATORY DATA:  I have reviewed the data as listed    Latest Ref Rng & Units 08/17/2023    9:31 AM 07/05/2023   12:29 PM 07/05/2023   12:26 PM  CBC  WBC 4.0 - 10.5 K/uL 8.7  6.7  6.3   Hemoglobin 13.0 - 17.0 g/dL 85.9  86.6  85.9   Hematocrit 39.0 - 52.0 % 39.1  39.7  37.4   Platelets 150 - 400 K/uL 468  423  480        Latest Ref Rng & Units 08/17/2023    9:31 AM 07/05/2023   12:29 PM 07/05/2023   12:26 PM  CMP  Glucose 70 - 99 mg/dL 69  93  69   BUN 8 - 23 mg/dL 23  24  24    Creatinine 0.61 - 1.24 mg/dL 8.05  8.07  7.97   Sodium 135 - 145 mmol/L 144  142  143   Potassium 3.5 - 5.1 mmol/L 4.0  4.6  4.9   Chloride 98 - 111 mmol/L 112  110  109   CO2 22 - 32 mmol/L 26  22  22    Calcium  8.9 - 10.3 mg/dL 9.1  9.0  9.2   Total Protein 6.5 - 8.1 g/dL 7.0  6.3  6.9   Total Bilirubin 0.0 - 1.2 mg/dL 0.4  0.9  0.8   Alkaline Phos 38 - 126 U/L 70  57  62   AST 15 - 41 U/L 22  29  36   ALT 0 - 44 U/L 12  14  18      RADIOGRAPHIC STUDIES: CUP PACEART REMOTE DEVICE CHECK Result Date: 11/05/2023 ILR summary report received. Battery status OK. Normal device function. No new symptom, tachy, brady, or pause episodes. No new AF episodes. Monthly summary reports and ROV/PRN LA, CVRS   ASSESSMENT & PLAN Donyae Kilner is a 78 y.o. male with medical history significant for essential thrombocytosis, JAK2 positive who presents for a follow up visit.   # Essential Thrombocytosis, JAK2 Positive  --Diagnosis confirmed by bone marrow biopsy on 08/05/2021. --target Plt count <400.  Patient currently above target at 468.  Unfortunately the patient was on vacation last week and forgot to take his  medication with him.  That explains the high counts. --currently on Hydroxyurea  500 mg once a day on Mondays, Wednesday, Fridays and Weekends. Holding Tuesdays and Thursdays --Labs today show WBC *** --Continue with Hydroxyurea  dosed as noted above. Continue ASA 81 mg PO daily --RTC in 12 weeks for labs/follow up and interval labs in 4 weeks.  No orders of the defined types were placed in this encounter.   All questions were answered. The patient knows to call the clinic with any problems, questions or concerns.  A total of more than 30 minutes were spent on this encounter with face-to-face time and non-face-to-face time, including preparing to see the patient, ordering tests and/or medications, counseling the patient and coordination of care as outlined above.   Norleen IVAR Kidney, MD Department of Hematology/Oncology [facility] Phone: [phone] Pager: [phone] Email: [email or web address]    11/16/2023 11:35 AM
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
## 2023-11-18 ENCOUNTER — Encounter: Payer: Worker's Compensation | Admitting: Physical Medicine & Rehabilitation

## 2023-11-20 ENCOUNTER — Inpatient Hospital Stay: Admitting: Hematology and Oncology

## 2023-11-20 ENCOUNTER — Inpatient Hospital Stay

## 2023-12-06 ENCOUNTER — Ambulatory Visit

## 2023-12-07 ENCOUNTER — Encounter

## 2023-12-07 ENCOUNTER — Ambulatory Visit: Attending: Cardiovascular Disease

## 2023-12-07 DIAGNOSIS — I429 Cardiomyopathy, unspecified: Secondary | ICD-10-CM | POA: Diagnosis not present

## 2023-12-08 ENCOUNTER — Ambulatory Visit: Payer: Self-pay | Admitting: Cardiovascular Disease

## 2023-12-08 ENCOUNTER — Inpatient Hospital Stay: Attending: Hematology and Oncology

## 2023-12-08 ENCOUNTER — Inpatient Hospital Stay: Admitting: Hematology and Oncology

## 2023-12-08 LAB — CUP PACEART REMOTE DEVICE CHECK
Date Time Interrogation Session: 20251130231321
Implantable Pulse Generator Implant Date: 20230421

## 2023-12-08 NOTE — Telephone Encounter (Signed)
 Attempted to reach pt by phone today regarding his missed appt today.  Voice mailbox full. Message sent through my chart.

## 2023-12-11 NOTE — Progress Notes (Signed)
 Remote Loop Recorder Transmission

## 2023-12-21 NOTE — Progress Notes (Deleted)
 [facility] [phone]   [phone]  PROGRESS NOTE  Patient Care Team: Gladystine Erminio CROME, MD as PCP - General (Family Medicine) Lonni Slain, MD as PCP - Cardiology (Cardiology) Gladis Elsie JAYSON DEVONNA (Family Medicine) Lonni Slain, MD as Consulting Physician (Cardiology)  Hematological/Oncological History # Essential Thrombocytosis, JAK2 Positive  05/31/2021: establish care with Dr. Federico for thrombocytosis. Found to be JAK2 positive.  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  The differential diagnosis includes essential thrombocythemia and early phase of primary myelofibrosis. 09/10/2021: start hydroxyurea  500 mg QOD. Was to start on 8/4 but patient was hesitant.  10/15/2021: WBC 7.7, Hgb 16.0, MCV 85.5, Plt 404 12/10/2021: WBC 6.1, Hgb 15.6, MCV 90.5, Plt 351 07/02/2022: WBC 6.8, Hgb 15.6, MCV 88.7, Plt 455K.  07/30/2022: WBC 5.8, Hgb 15.5, MCV 87.7, Plt 491K. Increased dose of Hydroxyurea  500 mg once a day on Mondays, Wednesday, Fridays and Weekends. Holding Tuesdays and Thursdays 09/03/2022: WBC 6.1, Hgb 14.6, MCV 87.9, Plt 312K  Interval History:  Gary [name] 78 y.o. male with medical history significant for essential thrombocytosis, JAK2 positive who presents for a follow up visit. The patient's last visit was on 08/17/2023. In the interim since the last visit he has continued hydroxyurea  therapy.  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   Trauma      SURGICAL HISTORY: Past Surgical History:  Procedure Laterality Date   LOOP RECORDER INSERTION N/A 04/26/2021   Procedure: LOOP RECORDER INSERTION;  Surgeon: Francyne Headland, MD;  Location: [facility] INVASIVE CV LAB;  Service: Cardiovascular;  Laterality: N/A;   MINOR REMOVAL OF MANDIBULAR HARDWARE N/A 12/15/2019   Procedure: REMOVAL OF RIGHT LATERAL ORBITAL MINIPLATE;  Surgeon: Lowery Estefana RAMAN, DO;  Location: [facility] OR;  Service: Plastics;  Laterality: N/A;   ORIF MANDIBULAR FRACTURE Bilateral 07/27/2019   Procedure: OPEN REDUCTION INTERNAL FIXATION (ORIF) OF COMPLEX ZYGOMATIC FRACTURE;  Surgeon: Lowery Estefana RAMAN, DO;  Location: [facility] OR;  Service: Plastics;  Laterality: Bilateral;  2 hours, please    SOCIAL HISTORY: Social History   Socioeconomic History   Marital status: Married    Spouse name: Dodger Sinning   Number of children: 2   Years of education: Not on file   Highest education level: Not on file  Occupational History   Occupation: Quarry manager    Comment: [employer]  Tobacco Use   Smoking status: Former    Current packs/day: 0.00    Types: Cigarettes    Quit date: 1971    Years since quitting: 54.9   Smokeless tobacco: Never  Vaping Use   Vaping status: Never Used  Substance and Sexual Activity   Alcohol use: Not Currently   Drug use: Never   Sexual activity: Yes  Other Topics Concern   Not on file  Social History Narrative   ** Merged History Encounter **       Social Drivers of Health   Tobacco Use: Medium Risk (10/08/2023)  Received from [facility]   Patient History    Smoking Tobacco Use: Former    Smokeless Tobacco Use: Never    Passive Exposure: Not on file  Financial Resource Strain: Medium Risk (04/10/2023)   Received from [facility]   Overall Financial Resource Strain (CARDIA)    Difficulty of Paying Living Expenses: Somewhat hard  Food Insecurity: No Food Insecurity (04/10/2023)   Received from [facility]   Epic    Within the  past 12 months, you worried that your food would run out before you got the money to buy more.: Never true    Within the past 12 months, the food you bought just didn't last and you didn't have money to get more.: Never true  Transportation Needs: No Transportation Needs (04/10/2023)   Received from [facility] - Transportation    Lack of Transportation (Medical): No    Lack of Transportation (Non-Medical): No  Physical Activity: Unknown (02/19/2022)   Received from [facility]   Exercise Vital Sign    On average, how many days per week do you engage in moderate to strenuous exercise (like a brisk walk)?: 0 days    Minutes of Exercise per Session: Not on file  Stress: Stress Concern Present (02/19/2022)   Received from [facility] - Occupational Stress Questionnaire    Feeling of Stress : To some extent  Social Connections: Somewhat Isolated (02/19/2022)   Received from [facility]   Social Network    How would you rate your social network (family, work, friends)?: Restricted participation with some degree of social isolation  Intimate Partner Violence: Not At Risk (02/19/2022)   Received from [facility]   HITS    Over the last 12 months how often did your partner physically hurt you?: Never    Over the last 12 months how often did your partner insult you or talk down to you?: Never    Over the last 12 months how often did your partner threaten you with physical harm?: Never    Over the last 12 months how often did your partner scream or curse at you?: Rarely  Depression (PHQ2-9): Low Risk (05/20/2023)   Depression (PHQ2-9)    PHQ-2 Score: 2  Alcohol Screen: Not on file  Housing: High Risk (04/10/2023)   [facility] from [facility]    In the last 12 months, was there a time when you were not able to pay the mortgage or rent on time?: Yes    In the past 12 months, how many times have you moved where you were living?: 0     At any time in the past 12 months, were you homeless or living in a shelter (including now)?: No  Utilities: Not At Risk (04/10/2023)   Received from [facility] Utilities    Threatened with loss of utilities: No  Health Literacy: Not on file    FAMILY HISTORY: Family History  Problem Relation Age of Onset   Heart disease Mother    Hypertension Mother    Stroke Mother    Polycythemia Father    Polycythemia Brother    Leukemia Brother     ALLERGIES:  is allergic to levofloxacin , atomoxetine hcl, and shellfish allergy.  MEDICATIONS:  Current Outpatient Medications  Medication Sig Dispense Refill   acetaminophen  (TYLENOL ) 325 MG tablet Take 1-2 tablets (325-650 mg total) by mouth every 4 (four) hours as  needed for mild pain.     amLODipine  (NORVASC ) 5 MG tablet Take 1.5 tablets (7.5 mg total) by mouth daily. 135 tablet 3   amphetamine -dextroamphetamine  (ADDERALL  XR) 30 MG 24 hr capsule Take 1 capsule (30 mg total) by mouth daily. 30 capsule 0   amphetamine -dextroamphetamine  (ADDERALL  XR) 30 MG 24 hr capsule Take 1 capsule (30 mg total) by mouth daily. 30 capsule 0   amphetamine -dextroamphetamine  (ADDERALL ) 20 MG tablet Take 1 tablet (20 mg total) by mouth daily. 30 tablet 0   amphetamine -dextroamphetamine  (ADDERALL ) 20 MG tablet Take 1 tablet (20 mg total) by mouth daily. 30 tablet 0   apixaban  (ELIQUIS ) 5 MG TABS tablet Take 1 tablet (5 mg total) by mouth 2 (two) times daily. 180 tablet 1   azelastine  (ASTELIN ) 0.1 % nasal spray Place 1 spray into both nostrils 2 (two) times daily. Use in each nostril as directed 30 mL 12   b complex vitamins capsule Take 1 capsule by mouth daily.     Brivaracetam  (BRIVIACT ) 50 MG TABS Take 50 mg by mouth 2 (two) times daily. (Patient not taking: Reported on 09/09/2023) 60 tablet 5   carvedilol  (COREG ) 3.125 MG tablet Take 1 tablet (3.125 mg total) by mouth 2 (two) times daily with a meal. 60 tablet 6   cetirizine  (ZYRTEC ) 10 MG tablet Take 1  tablet (10 mg total) by mouth daily. 30 tablet 11   ezetimibe  (ZETIA ) 10 MG tablet Take 1 tablet (10 mg total) by mouth daily. 90 tablet 1   finasteride  (PROSCAR ) 5 MG tablet Take 1 tablet (5 mg total) by mouth daily. (Patient not taking: Reported on 09/09/2023) 30 tablet 0   hydrALAZINE  (APRESOLINE ) 10 MG tablet Take 1 tablet (10 mg total) by mouth 3 (three) times daily. 270 tablet 3   hydroxyurea  (HYDREA ) 500 MG capsule Take 1 tablet (500 mg) on Mondays, Wednesdays, Fridays and Weekends. Holding on Tuesdays and Thursdays.  May take with food to minimize GI side effects. 90 capsule 1   isosorbide  mononitrate (IMDUR ) 30 MG 24 hr tablet Take 1 tablet (30 mg total) by mouth daily. Pt needs to keep upcoming appt in Sept for further refills 90 tablet 0   levothyroxine  (SYNTHROID ) 75 MCG tablet Take 1 tablet (75 mcg total) by mouth daily before breakfast. 30 tablet 4   meclizine  (ANTIVERT ) 25 MG tablet Take 1 tablet (25 mg total) by mouth 3 (three) times daily as needed for dizziness. 30 tablet 0   Multiple Vitamin (MULTIVITAMIN WITH MINERALS) TABS tablet Take 1 tablet by mouth daily.     ondansetron  (ZOFRAN ) 4 MG tablet Take 1 tablet (4 mg total) by mouth every 6 (six) hours. 12 tablet 0   oxyCODONE  (ROXICODONE ) 5 MG immediate release tablet Take 1 tablet (5 mg total) by mouth every 6 (six) hours as needed for up to 10 doses. 10 tablet 0   pantoprazole  (PROTONIX ) 40 MG tablet TAKE 1 TABLET(40 MG) BY MOUTH DAILY 90 tablet 3   rosuvastatin  (CRESTOR ) 20 MG tablet Take 1 tablet (20 mg total) by mouth daily. 30 tablet 6   VITAMIN D  PO Take 125 mcg by mouth daily. PER WIFE POWDER     No current facility-administered medications for this visit.    REVIEW OF SYSTEMS:   Constitutional: ( - ) fevers, ( - )  chills , ( - ) night sweats Eyes: ( - ) blurriness of vision, ( - ) double vision, ( - ) watery eyes Ears, nose, mouth, throat, and  face: ( - ) mucositis, ( - ) sore throat Respiratory: ( - ) cough, ( - )  dyspnea, ( - ) wheezes Cardiovascular: ( - ) palpitation, ( - ) chest discomfort, ( - ) lower extremity swelling Gastrointestinal:  ( - ) nausea, ( - ) heartburn, ( - ) change in bowel habits Skin: ( - ) abnormal skin rashes Lymphatics: ( - ) new lymphadenopathy, ( - ) easy bruising Neurological: ( - ) numbness, ( - ) tingling, ( - ) new weaknesses Behavioral/Psych: ( - ) mood change, ( - ) new changes  All other systems were reviewed with the patient and are negative.  PHYSICAL EXAMINATION:  There were no vitals filed for this visit.  There were no vitals filed for this visit.   GENERAL: Well-appearing elderly Caucasian male, alert, no distress and comfortable SKIN: skin color, texture, turgor are normal, no rashes or significant lesions EYES: conjunctiva are pink and non-injected, sclera clear LUNGS: clear to auscultation and percussion with normal breathing effort HEART: regular rate & rhythm and no murmurs and no lower extremity edema Musculoskeletal: no cyanosis of digits and no clubbing  PSYCH: alert & oriented x 3, fluent speech NEURO: no focal motor/sensory deficits  LABORATORY DATA:  I have reviewed the data as listed    Latest Ref Rng & Units 08/17/2023    9:31 AM 07/05/2023   12:29 PM 07/05/2023   12:26 PM  CBC  WBC 4.0 - 10.5 K/uL 8.7  6.7  6.3   Hemoglobin 13.0 - 17.0 g/dL 85.9  86.6  85.9   Hematocrit 39.0 - 52.0 % 39.1  39.7  37.4   Platelets 150 - 400 K/uL 468  423  480        Latest Ref Rng & Units 08/17/2023    9:31 AM 07/05/2023   12:29 PM 07/05/2023   12:26 PM  CMP  Glucose 70 - 99 mg/dL 69  93  69   BUN 8 - 23 mg/dL 23  24  24    Creatinine 0.61 - 1.24 mg/dL 8.05  8.07  7.97   Sodium 135 - 145 mmol/L 144  142  143   Potassium 3.5 - 5.1 mmol/L 4.0  4.6  4.9   Chloride 98 - 111 mmol/L 112  110  109   CO2 22 - 32 mmol/L 26  22  22    Calcium  8.9 - 10.3 mg/dL 9.1  9.0  9.2   Total Protein 6.5 - 8.1 g/dL 7.0  6.3  6.9   Total Bilirubin 0.0 - 1.2 mg/dL 0.4   0.9  0.8   Alkaline Phos 38 - 126 U/L 70  57  62   AST 15 - 41 U/L 22  29  36   ALT 0 - 44 U/L 12  14  18      RADIOGRAPHIC STUDIES: CUP PACEART REMOTE DEVICE CHECK Result Date: 12/08/2023 ILR summary report received. Battery status OK. Normal device function. No new symptom, tachy, brady, or pause episodes. No new AF episodes. Monthly summary reports and ROV/PRN LA, CVRS    ASSESSMENT & PLAN Gary [name] is a 78 y.o. male with medical history significant for essential thrombocytosis, JAK2 positive who presents for a follow up visit.   # Essential Thrombocytosis, JAK2 Positive  --Diagnosis confirmed by bone marrow biopsy on 08/05/2021. --target Plt count <400.  Patient currently above target at 468.  Unfortunately the patient was on vacation last week and forgot to take his medication with him.  That explains the high counts. --currently on Hydroxyurea  500 mg once a day on Mondays, Wednesday, Fridays and Weekends. Holding Tuesdays and Thursdays --Labs today show WBC *** --Continue with Hydroxyurea  dosed as noted above. Continue ASA 81 mg PO daily --RTC in 12 weeks for labs/follow up and interval labs in 4 weeks.  No orders of the defined types were placed in this encounter.   All questions were answered. The patient knows to call the clinic with any problems, questions or concerns.  A total of more than 30 minutes were spent on this encounter with face-to-face time and non-face-to-face time, including preparing to see the patient, ordering tests and/or medications, counseling the patient and coordination of care as outlined above.   Norleen IVAR Kidney, MD Department of Hematology/Oncology [facility] Phone: [phone] Pager: [phone] Email: [email or web address]    12/21/2023 9:09 AM

## 2023-12-22 ENCOUNTER — Inpatient Hospital Stay

## 2023-12-22 ENCOUNTER — Inpatient Hospital Stay: Admitting: Hematology and Oncology

## 2023-12-25 ENCOUNTER — Telehealth: Payer: Self-pay | Admitting: Hematology and Oncology

## 2023-12-25 NOTE — Telephone Encounter (Signed)
 Patient and spouse aware of lab and MD appointments on 02/05/2024.

## 2023-12-30 NOTE — Progress Notes (Signed)
 This patient's chart has been reviewed by a [facility] Conne[facility] Specialist.   Attempted to contact patient in order to discuss appointments and health maintenance screenings due for the upcoming year.   Left message with Care Connection Specialist's contact information. and [employer] message with health maintenance recommendations and [facility] Connection Specialist's contact information..  Additional Comments:

## 2024-01-05 ENCOUNTER — Encounter: Payer: Self-pay | Admitting: Physical Medicine & Rehabilitation

## 2024-01-05 DIAGNOSIS — F900 Attention-deficit hyperactivity disorder, predominantly inattentive type: Secondary | ICD-10-CM

## 2024-01-05 DIAGNOSIS — Z87820 Personal history of traumatic brain injury: Secondary | ICD-10-CM

## 2024-01-06 ENCOUNTER — Ambulatory Visit

## 2024-01-07 ENCOUNTER — Encounter

## 2024-01-07 ENCOUNTER — Ambulatory Visit

## 2024-01-07 DIAGNOSIS — I429 Cardiomyopathy, unspecified: Secondary | ICD-10-CM

## 2024-01-08 LAB — CUP PACEART REMOTE DEVICE CHECK
Date Time Interrogation Session: 20251231230952
Implantable Pulse Generator Implant Date: 20230421

## 2024-01-09 ENCOUNTER — Ambulatory Visit: Payer: Self-pay | Admitting: Cardiovascular Disease

## 2024-01-12 MED ORDER — AMPHETAMINE-DEXTROAMPHET ER 30 MG PO CP24: 30.0000 mg | ORAL_CAPSULE | Freq: Every day | ORAL | 0 refills | Status: AC

## 2024-01-12 MED ORDER — AMPHETAMINE-DEXTROAMPHETAMINE 20 MG PO TABS: 20.0000 mg | ORAL_TABLET | Freq: Every day | ORAL | 0 refills | Status: AC

## 2024-01-12 NOTE — Progress Notes (Signed)
 Remote Loop Recorder Transmission

## 2024-01-12 NOTE — Telephone Encounter (Signed)
 Both rx'es refilled for Jan and Feb

## 2024-01-27 ENCOUNTER — Encounter
Payer: Worker's Compensation | Attending: Physical Medicine & Rehabilitation | Admitting: Physical Medicine & Rehabilitation

## 2024-01-27 ENCOUNTER — Encounter: Payer: Self-pay | Admitting: Physical Medicine & Rehabilitation

## 2024-01-27 VITALS — BP 169/76 | HR 78 | Ht 70.0 in | Wt 150.4 lb

## 2024-01-27 DIAGNOSIS — D75839 Thrombocytosis, unspecified: Secondary | ICD-10-CM | POA: Insufficient documentation

## 2024-01-27 DIAGNOSIS — F09 Unspecified mental disorder due to known physiological condition: Secondary | ICD-10-CM | POA: Diagnosis not present

## 2024-01-27 DIAGNOSIS — S069XAS Unspecified intracranial injury with loss of consciousness status unknown, sequela: Secondary | ICD-10-CM | POA: Diagnosis not present

## 2024-01-27 DIAGNOSIS — G3189 Other specified degenerative diseases of nervous system: Secondary | ICD-10-CM | POA: Insufficient documentation

## 2024-01-27 DIAGNOSIS — F900 Attention-deficit hyperactivity disorder, predominantly inattentive type: Secondary | ICD-10-CM | POA: Diagnosis not present

## 2024-01-27 NOTE — Patient Instructions (Signed)
" °  VISIT SUMMARY: During your visit, we discussed your ongoing cognitive and physical symptoms following your traumatic brain injury. We reviewed your memory issues, chronic lower extremity pain, and possible drug toxicity. We also addressed concerns about your kidney function and care coordination challenges.  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 We recommend continuing to participate in your support group, exercising regularly, and using memory aids like a notepad and phone dictation. These strategies can help manage your symptoms.  -CHRONIC BILATERAL KNEE AND LOWER EXTREMITY PAIN: You experience chronic pain in your knees and feet. We suggest using topical treatments like roll-on lidocaine , diclofenac  gel, and lidocaine  or IcyHot patches. Apply diclofenac  gel to your knees three to four times daily. Physical therapy exercises focusing on strengthening your quadriceps and hamstrings can also help. We advise against using gabapentin  due to potential balance and cognitive risks.  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 It's important to stay hydrated and monitor your foot symptoms and skin changes. Follow up with Dr. Federico in hematology to assess for hydroxyurea  toxicity. If symptoms persist, a rheumatology referral may be needed. Continue to take care of your skin and watch for signs of ischemia or infection.  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 Your recent foot symptoms may be related to your kidney function. It's important to stay hydrated  and monitor your renal function with upcoming lab tests.  INSTRUCTIONS: Please follow up with Dr. Federico in hematology to assess for hydroxyurea  toxicity. If your symptoms persist after this evaluation, we may refer you to a rheumatologist. Continue to monitor your renal function with the upcoming laboratory tests. Maintain adequate hydration and keep using the recommended topical treatments and physical therapy exercises.    Contains text generated by [employer].   "

## 2024-01-27 NOTE — Progress Notes (Signed)
 "  Subjective:    Patient ID: Gary [name], male    DOB: [birth year], 79 y.o.   MRN: [id number]  HPI  Discussed the use of AI scribe software for clinical note transcription with the patient, who gave verbal consent to proceed.  History of Present Illness Gary [name] is a 79 year old male with traumatic brain injury and chronic lower extremity pain who presents for follow-up of cognitive and physical symptoms.  Cognitive Impairment: - Persistent short-term memory deficits, perceived as worsening by caregiver - Increasing difficulty recalling names and recognizing acquaintances, including inability to remember support group members - Frequent use of notepad and phone for memory aids, but struggles to recall context of notes - Remains engaged in activities, attending support group regularly and exercising at the gym 2-3 times per week - Social interactions primarily limited to support group  Lower Extremity Pain and Discoloration: - Chronic pain in feet and knees, migratory in nature (toes, shins, knees) - Foot pain associated with dark discoloration on plantar aspect of toes - Symptoms initially suspected to be related to Raynaud's phenomenon - Worsening of symptoms over time, with some improvement with increased fluid intake - Pain at times severe enough to cause significant difficulty ambulating and nocturnal pain when touched by blankets - No associated swelling - Partial relief with topical lidocaine  and diclofenac  gel - Has not initiated gabapentin  due to concerns about balance  Possible Drug Toxicity and Systemic Symptoms: - Currently taking hydroxyurea , suspected to contribute to symptoms - Recent laboratory evaluation revealed mildly elevated uric acid - Weight loss and decreased appetite, attributed by caregiver to possible hydroxyurea  toxicity - Intermittent consumption of protein shakes when reminded  Renal and Vascular Concerns: - History of Raynaud's  phenomenon - Concern for possible renal involvement due to decreased oral intake and improvement in symptoms with increased hydration - Chronic kidney disease stage 3b - CBC reportedly ordered by previous provider, results unavailable  Care Coordination Challenges: - Difficulty with primary care coordination following departure of previous physician   Pain Inventory Average Pain 5 Pain Right Now 1 My pain is intermittent, constant, and unable to describe the pain  In the last 24 hours, has pain interfered with the following? General activity 5 Relation with others 5 Enjoyment of life 5 What TIME of day is your pain at its worst? morning , daytime, evening, and night Sleep (in general) Good  Pain is worse with: walking Pain improves with: medication Relief from Meds: 2  Family History  Problem Relation Age of Onset   Heart disease Mother    Hypertension Mother    Stroke Mother    Polycythemia Father    Polycythemia Brother    Leukemia Brother    Social History   Socioeconomic History   Marital status: Married    Spouse name: Tylor Courtwright   Number of children: 2   Years of education: Not on file   Highest education level: Not on file  Occupational History   Occupation: Quarry manager    Comment: [employer]  Tobacco Use   Smoking status: Former    Current packs/day: 0.00    Types: Cigarettes    Quit date: 1971    Years since quitting: 55.0   Smokeless tobacco: Never  Vaping Use   Vaping status: Never Used  Substance and Sexual Activity   Alcohol use: Not Currently   Drug use: Never   Sexual activity: Yes  Other Topics Concern   Not on file  Social  History Narrative   ** Merged History Encounter **       Social Drivers of Health   Tobacco Use: Medium Risk (01/27/2024)   Patient History    Smoking Tobacco Use: Former    Smokeless Tobacco Use: Never    Passive Exposure: Not on file  Financial Resource Strain: Low Risk (01/15/2024)   Received from  [facility]   Overall Financial Resource Strain (CARDIA)    How hard is it for you to pay for the very basics like food, housing, medical care, and heating?: Not hard at all  Food Insecurity: No Food Insecurity (01/15/2024)   Received from [facility]   Epic    Within the past 12 months, you worried that your food would run out before you got the money to buy more.: Never true    Within the past 12 months, the food you bought just didn't last and you didn't have money to get more.: Never true  Transportation Needs: No Transportation Needs (01/15/2024)   Received from [facility]    In the past 12 months, has lack of transportation kept you from medical appointments or from getting medications?: No    In the past 12 months, has lack of transportation kept you from meetings, work, or from getting things needed for daily living?: No  Physical Activity: Unknown (02/19/2022)   Received from [facility]   Exercise Vital Sign    On average, how many days per week do you engage in moderate to strenuous exercise (like a brisk walk)?: 0 days    Minutes of Exercise per Session: Not on file  Stress: Stress Concern Present (02/19/2022)   Received from [facility] - Occupational Stress Questionnaire    Feeling of Stress : To some extent  Social Connections: Somewhat Isolated (02/19/2022)   Received from [facility]   Social Network    How would you rate your social network (family, work, friends)?: Restricted participation with some degree of social isolation  Depression (PHQ2-9): Low Risk (05/20/2023)   Depression (PHQ2-9)    PHQ-2 Score: 2  Alcohol Screen: Not on file  Housing: Low Risk (01/15/2024)   Received from [facility]    In the last 12 months, was there a time when you were not able to pay the mortgage or rent on time?: No    In the past 12 months, how many times have you moved where you were living?: 0    At any time in the  past 12 months, were you homeless or living in a shelter (including now)?: No  Utilities: Not At Risk (01/15/2024)   Received from [facility]    In the past 12 months has the electric, gas, oil, or water  company threatened to shut off services in your home?: No  Health Literacy: Not on file   Past Surgical History:  Procedure Laterality Date   LOOP RECORDER INSERTION N/A 04/26/2021   Procedure: LOOP RECORDER INSERTION;  Surgeon: Francyne Headland, MD;  Location: [facility];  Service: Cardiovascular;  Laterality: N/A;   MINOR REMOVAL OF MANDIBULAR HARDWARE N/A 12/15/2019   Procedure: REMOVAL OF RIGHT LATERAL ORBITAL MINIPLATE;  Surgeon: Lowery Estefana RAMAN, DO;  Location: [facility] OR;  Service: Plastics;  Laterality: N/A;   ORIF MANDIBULAR FRACTURE Bilateral 07/27/2019   Procedure: OPEN REDUCTION INTERNAL FIXATION (ORIF) OF COMPLEX ZYGOMATIC FRACTURE;  Surgeon: Lowery Estefana RAMAN, DO;  Location: [facility] OR;  Service: Plastics;  Laterality: Bilateral;  2 hours, please   Past Surgical History:  Procedure Laterality Date   LOOP RECORDER INSERTION N/A 04/26/2021   Procedure: LOOP RECORDER INSERTION;  Surgeon: Francyne Headland, MD;  Location: [facility];  Service: Cardiovascular;  Laterality: N/A;   MINOR REMOVAL OF MANDIBULAR HARDWARE N/A 12/15/2019   Procedure: REMOVAL OF RIGHT LATERAL ORBITAL MINIPLATE;  Surgeon: Lowery Estefana RAMAN, DO;  Location: [facility] OR;  Service: Plastics;  Laterality: N/A;   ORIF MANDIBULAR FRACTURE Bilateral 07/27/2019   Procedure: OPEN REDUCTION INTERNAL FIXATION (ORIF) OF COMPLEX ZYGOMATIC FRACTURE;  Surgeon: Lowery Estefana RAMAN, DO;  Location: [facility] OR;  Service: Plastics;  Laterality: Bilateral;  2 hours, please   Past Medical History:  Diagnosis Date   ADHD    Allergy    CKD (chronic kidney disease)    GERD (gastroesophageal reflux disease)    History of chickenpox    History of diverticulitis 2007   History of kidney stones    HTN (hypertension)     Reflux    Renal disorder    kidney stones   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   Trauma    BP (!) 169/76 (BP Location: Left Arm, Patient Position: Sitting, Cuff Size: Normal)   Pulse 78   Ht 5' 10 (1.778 m)   Wt 150 lb 6.4 oz (68.2 kg)   SpO2 97%   BMI 21.58 kg/m   Opioid Risk Score:   Fall Risk Score:  `1  Depression screen PHQ 2/9     05/20/2023   12:00 PM 01/21/2023   11:43 AM 02/19/2022    1:28 PM 12/04/2021   10:55 AM 05/29/2021   11:07 AM 01/30/2021   11:37 AM 10/26/2020    9:44 AM  Depression screen PHQ 2/9  Decreased Interest 1 1 1 1  0  1  Down, Depressed, Hopeless 1 1 1 1  0 1 1  PHQ - 2 Score 2 2 2 2  0 1 2  Altered sleeping       0  Tired, decreased energy       3  Change in appetite       0  Feeling bad or failure about yourself        0  Trouble concentrating       3  Moving slowly or fidgety/restless       2  Suicidal thoughts       0  PHQ-9 Score       10   Difficult doing work/chores       Somewhat difficult     Data saved with a previous flowsheet row definition      Review of Systems  Musculoskeletal:        Bilateral foot pain  Neurological:  Positive for headaches.  All other systems reviewed and are negative.      Objective:   Physical Exam General: No acute distress HEENT: NCAT, EOMI, oral membranes moist Cards: reg rate  Chest: normal effort Abdomen: Soft, NT, ND Skin: dry, intact Extremities: no edema Psych: pleasant and appropriate  Skin: distal feet tender, toes purple.  Neuro: Patient is alert and oriented to month but not the date or year.   He is able to follow basic commands.  Slow to process. Stm deficits still present. Often looks to his wife to finish answers for him. Wife corrects him often. Cranial nerve exam was somewhat  nonfocal.  Strength is grossly 5/5 .     Gait remains wide based  .  No focal limb ataxia was seen.  No gross sensory abnormalities. Uses cane  for balance. Tends to lean forward without cane. Romberg test is equivocal  Musculoskeletal: minimal crepitus at knees.  SABRA            PLAN: 1. 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 Mood issues as well.   -goes to gym  -aid/assistant to guide him at home and in the community due to his poor attention/memory as well as limited safety awareness.  2.  Popliteal DVT--continue eliquis --recheck dopplers in 3-6 mos? 3. Pain in toes/feet/fingers/raynaud like appearance as well as low back pain:  -?some improvement with amlodipine ---continue 5mg  - keep hands and feet warms, covered when possible.     -tylenol  prn  -rheum follow up?  -hydroxyurea  effect?  -voltaren  gel 4. Depression:               -off zoloft  100mg  daily per his wife.  -Vitamin B and D seem to be helping energy demeanor as a whole.               -keep Adderall  XR to 30 mg daily mg daily.   -Continue Adderall  immediate release 20 mg daily in the afternoon             -continue [facility]              - gym, social reintegration   5. Bilateral knee pain most c/w quad tendonitis, OA knees -needs to strengthen legs, quads--discussed exercises -voltaren  gel BID to TID                      6:  Hypertension: continue Coreg  and Norvasc  13: Sz proph: appears to be off keppra  now (did not address this visit) 14: Ocular--continue [facility], ophtho follow up 15: Cardiomyopathy:              --follow-up with cardiology as directed  16: CKD 3b: follow-up serum creatinine             -f/u with primary, eventually nephrology           17. Insomnia             -being more active socially and physically during the day should help with sleep too             -voiding before bed, PM fluid rationing 18. Mild thrombocytosis: follow up with oncology re: hydroxurea                 20 minutes of face  to face patient care time were spent during this visit. All questions were encouraged and answered.    Follow up with me in 6 mos.            "

## 2024-02-05 ENCOUNTER — Inpatient Hospital Stay: Attending: Hematology and Oncology

## 2024-02-05 ENCOUNTER — Inpatient Hospital Stay: Admitting: Hematology and Oncology

## 2024-02-05 VITALS — BP 145/63 | HR 75 | Temp 97.3°F | Resp 16 | Wt 155.4 lb

## 2024-02-05 DIAGNOSIS — D473 Essential (hemorrhagic) thrombocythemia: Secondary | ICD-10-CM

## 2024-02-05 DIAGNOSIS — Z1589 Genetic susceptibility to other disease: Secondary | ICD-10-CM

## 2024-02-05 DIAGNOSIS — D75839 Thrombocytosis, unspecified: Secondary | ICD-10-CM

## 2024-02-05 LAB — CBC WITH DIFFERENTIAL (CANCER CENTER ONLY)
Abs Immature Granulocytes: 0.03 10*3/uL (ref 0.00–0.07)
Basophils Absolute: 0.1 10*3/uL (ref 0.0–0.1)
Basophils Relative: 1 %
Eosinophils Absolute: 0.3 10*3/uL (ref 0.0–0.5)
Eosinophils Relative: 3 %
HCT: 37.4 % — ABNORMAL LOW (ref 39.0–52.0)
Hemoglobin: 11.9 g/dL — ABNORMAL LOW (ref 13.0–17.0)
Immature Granulocytes: 0 %
Lymphocytes Relative: 8 %
Lymphs Abs: 0.6 10*3/uL — ABNORMAL LOW (ref 0.7–4.0)
MCH: 28.5 pg (ref 26.0–34.0)
MCHC: 31.8 g/dL (ref 30.0–36.0)
MCV: 89.7 fL (ref 80.0–100.0)
Monocytes Absolute: 0.5 10*3/uL (ref 0.1–1.0)
Monocytes Relative: 7 %
Neutro Abs: 6.3 10*3/uL (ref 1.7–7.7)
Neutrophils Relative %: 81 %
Platelet Count: 459 10*3/uL — ABNORMAL HIGH (ref 150–400)
RBC: 4.17 MIL/uL — ABNORMAL LOW (ref 4.22–5.81)
RDW: 17.4 % — ABNORMAL HIGH (ref 11.5–15.5)
WBC Count: 7.9 10*3/uL (ref 4.0–10.5)
nRBC: 0 % (ref 0.0–0.2)

## 2024-02-05 LAB — CMP (CANCER CENTER ONLY)
ALT: 13 U/L (ref 0–44)
AST: 25 U/L (ref 15–41)
Albumin: 3.9 g/dL (ref 3.5–5.0)
Alkaline Phosphatase: 91 U/L (ref 38–126)
Anion gap: 12 (ref 5–15)
BUN: 29 mg/dL — ABNORMAL HIGH (ref 8–23)
CO2: 23 mmol/L (ref 22–32)
Calcium: 9.1 mg/dL (ref 8.9–10.3)
Chloride: 109 mmol/L (ref 98–111)
Creatinine: 2.01 mg/dL — ABNORMAL HIGH (ref 0.61–1.24)
GFR, Estimated: 34 mL/min — ABNORMAL LOW
Glucose, Bld: 91 mg/dL (ref 70–99)
Potassium: 4.1 mmol/L (ref 3.5–5.1)
Sodium: 144 mmol/L (ref 135–145)
Total Bilirubin: 0.3 mg/dL (ref 0.0–1.2)
Total Protein: 7 g/dL (ref 6.5–8.1)

## 2024-02-06 ENCOUNTER — Ambulatory Visit

## 2024-02-07 ENCOUNTER — Ambulatory Visit: Attending: Cardiovascular Disease

## 2024-02-07 DIAGNOSIS — Z8673 Personal history of transient ischemic attack (TIA), and cerebral infarction without residual deficits: Secondary | ICD-10-CM

## 2024-02-09 LAB — CUP PACEART REMOTE DEVICE CHECK
Date Time Interrogation Session: 20260131230809
Implantable Pulse Generator Implant Date: 20230421

## 2024-02-11 ENCOUNTER — Ambulatory Visit: Payer: Self-pay | Admitting: Cardiovascular Disease

## 2024-02-12 NOTE — Progress Notes (Signed)
 Remote Loop Recorder Transmission

## 2024-03-02 ENCOUNTER — Encounter: Admitting: Psychology

## 2024-03-04 ENCOUNTER — Inpatient Hospital Stay: Attending: Hematology and Oncology

## 2024-03-09 ENCOUNTER — Ambulatory Visit

## 2024-04-09 ENCOUNTER — Ambulatory Visit

## 2024-04-29 ENCOUNTER — Inpatient Hospital Stay

## 2024-04-29 ENCOUNTER — Inpatient Hospital Stay: Attending: Hematology and Oncology | Admitting: Hematology and Oncology

## 2024-05-10 ENCOUNTER — Ambulatory Visit

## 2024-06-10 ENCOUNTER — Ambulatory Visit

## 2024-07-11 ENCOUNTER — Ambulatory Visit

## 2024-07-27 ENCOUNTER — Encounter: Admitting: Physical Medicine & Rehabilitation

## 2024-08-11 ENCOUNTER — Ambulatory Visit

## 2024-09-11 ENCOUNTER — Ambulatory Visit

## 2024-10-12 ENCOUNTER — Ambulatory Visit

## 2024-11-12 ENCOUNTER — Ambulatory Visit

## 2024-12-13 ENCOUNTER — Ambulatory Visit

## 2025-01-13 ENCOUNTER — Ambulatory Visit
